# Patient Record
Sex: Female | Born: 1935
Health system: Southern US, Community
[De-identification: ages and names within clinical notes are randomized; demographics above are authoritative.]

## PROBLEM LIST (undated history)

## (undated) DIAGNOSIS — K219 Gastro-esophageal reflux disease without esophagitis: Secondary | ICD-10-CM

## (undated) DIAGNOSIS — E079 Disorder of thyroid, unspecified: Secondary | ICD-10-CM

## (undated) DIAGNOSIS — F419 Anxiety disorder, unspecified: Secondary | ICD-10-CM

## (undated) DIAGNOSIS — E559 Vitamin D deficiency, unspecified: Secondary | ICD-10-CM

## (undated) DIAGNOSIS — M179 Osteoarthritis of knee, unspecified: Secondary | ICD-10-CM

## (undated) DIAGNOSIS — I10 Essential (primary) hypertension: Secondary | ICD-10-CM

## (undated) DIAGNOSIS — D649 Anemia, unspecified: Secondary | ICD-10-CM

## (undated) DIAGNOSIS — M51369 Other intervertebral disc degeneration, lumbar region without mention of lumbar back pain or lower extremity pain: Secondary | ICD-10-CM

## (undated) DIAGNOSIS — M1710 Unilateral primary osteoarthritis, unspecified knee: Secondary | ICD-10-CM

## (undated) DIAGNOSIS — R7303 Prediabetes: Secondary | ICD-10-CM

## (undated) DIAGNOSIS — T7840XA Allergy, unspecified, initial encounter: Secondary | ICD-10-CM

## (undated) DIAGNOSIS — E785 Hyperlipidemia, unspecified: Secondary | ICD-10-CM

## (undated) DIAGNOSIS — N179 Acute kidney failure, unspecified: Secondary | ICD-10-CM

## (undated) DIAGNOSIS — M5136 Other intervertebral disc degeneration, lumbar region: Secondary | ICD-10-CM

## (undated) DIAGNOSIS — F329 Major depressive disorder, single episode, unspecified: Secondary | ICD-10-CM

## (undated) DIAGNOSIS — F32A Depression, unspecified: Secondary | ICD-10-CM

## (undated) HISTORY — PX: ORIF DISTAL FEMUR FRACTURE: SUR926

## (undated) HISTORY — PX: CATARACT EXTRACTION: SUR2

## (undated) HISTORY — DX: Depression, unspecified: F32.A

## (undated) HISTORY — DX: Allergy, unspecified, initial encounter: T78.40XA

## (undated) HISTORY — DX: Unilateral primary osteoarthritis, unspecified knee: M17.10

## (undated) HISTORY — DX: Prediabetes: R73.03

## (undated) HISTORY — DX: Anemia, unspecified: D64.9

## (undated) HISTORY — DX: Osteoarthritis of knee, unspecified: M17.9

## (undated) HISTORY — DX: Essential (primary) hypertension: I10

## (undated) HISTORY — DX: Gastro-esophageal reflux disease without esophagitis: K21.9

## (undated) HISTORY — DX: Hyperlipidemia, unspecified: E78.5

## (undated) HISTORY — DX: Vitamin D deficiency, unspecified: E55.9

## (undated) HISTORY — DX: Major depressive disorder, single episode, unspecified: F32.9

## (undated) HISTORY — DX: Anxiety disorder, unspecified: F41.9

## (undated) HISTORY — DX: Disorder of thyroid, unspecified: E07.9

## (undated) HISTORY — DX: Other intervertebral disc degeneration, lumbar region without mention of lumbar back pain or lower extremity pain: M51.369

## (undated) HISTORY — DX: Acute kidney failure, unspecified: N17.9

## (undated) HISTORY — DX: Other intervertebral disc degeneration, lumbar region: M51.36

## (undated) HISTORY — PX: KNEE ARTHROSCOPY: SHX127

---

## 1998-02-07 ENCOUNTER — Ambulatory Visit (HOSPITAL_COMMUNITY): Admission: RE | Admit: 1998-02-07 | Discharge: 1998-02-07 | Payer: Self-pay | Admitting: *Deleted

## 1999-02-05 ENCOUNTER — Other Ambulatory Visit: Admission: RE | Admit: 1999-02-05 | Discharge: 1999-02-05 | Payer: Self-pay | Admitting: *Deleted

## 1999-02-27 ENCOUNTER — Ambulatory Visit (HOSPITAL_COMMUNITY): Admission: RE | Admit: 1999-02-27 | Discharge: 1999-02-27 | Payer: Self-pay | Admitting: *Deleted

## 2000-02-06 ENCOUNTER — Other Ambulatory Visit: Admission: RE | Admit: 2000-02-06 | Discharge: 2000-02-06 | Payer: Self-pay | Admitting: *Deleted

## 2000-03-02 ENCOUNTER — Ambulatory Visit (HOSPITAL_COMMUNITY): Admission: RE | Admit: 2000-03-02 | Discharge: 2000-03-02 | Payer: Self-pay | Admitting: *Deleted

## 2001-02-24 ENCOUNTER — Other Ambulatory Visit: Admission: RE | Admit: 2001-02-24 | Discharge: 2001-02-24 | Payer: Self-pay | Admitting: Internal Medicine

## 2001-08-10 ENCOUNTER — Encounter: Payer: Self-pay | Admitting: Internal Medicine

## 2001-08-10 ENCOUNTER — Ambulatory Visit (HOSPITAL_COMMUNITY): Admission: RE | Admit: 2001-08-10 | Discharge: 2001-08-10 | Payer: Self-pay | Admitting: Internal Medicine

## 2002-02-24 ENCOUNTER — Other Ambulatory Visit: Admission: RE | Admit: 2002-02-24 | Discharge: 2002-02-24 | Payer: Self-pay | Admitting: Internal Medicine

## 2004-03-05 ENCOUNTER — Other Ambulatory Visit: Admission: RE | Admit: 2004-03-05 | Discharge: 2004-03-05 | Payer: Self-pay | Admitting: Internal Medicine

## 2004-03-07 ENCOUNTER — Ambulatory Visit (HOSPITAL_COMMUNITY): Admission: RE | Admit: 2004-03-07 | Discharge: 2004-03-07 | Payer: Self-pay | Admitting: Internal Medicine

## 2006-03-26 ENCOUNTER — Other Ambulatory Visit: Admission: RE | Admit: 2006-03-26 | Discharge: 2006-03-26 | Payer: Self-pay | Admitting: Family Medicine

## 2006-04-02 ENCOUNTER — Ambulatory Visit (HOSPITAL_COMMUNITY): Admission: RE | Admit: 2006-04-02 | Discharge: 2006-04-02 | Payer: Self-pay | Admitting: Internal Medicine

## 2006-04-20 ENCOUNTER — Ambulatory Visit: Payer: Self-pay

## 2008-05-11 ENCOUNTER — Ambulatory Visit (HOSPITAL_COMMUNITY): Admission: RE | Admit: 2008-05-11 | Discharge: 2008-05-11 | Payer: Self-pay | Admitting: Internal Medicine

## 2008-11-03 LAB — HM COLONOSCOPY

## 2009-06-27 ENCOUNTER — Emergency Department (HOSPITAL_COMMUNITY): Admission: EM | Admit: 2009-06-27 | Discharge: 2009-06-27 | Payer: Self-pay | Admitting: Emergency Medicine

## 2009-07-27 ENCOUNTER — Encounter (HOSPITAL_COMMUNITY): Admission: RE | Admit: 2009-07-27 | Discharge: 2009-10-25 | Payer: Self-pay | Admitting: Gastroenterology

## 2010-09-21 ENCOUNTER — Inpatient Hospital Stay (HOSPITAL_COMMUNITY): Admission: EM | Admit: 2010-09-21 | Discharge: 2010-09-25 | Payer: Self-pay | Admitting: Emergency Medicine

## 2010-09-23 ENCOUNTER — Ambulatory Visit: Payer: Self-pay | Admitting: Physical Medicine & Rehabilitation

## 2011-01-14 LAB — CBC
HCT: 28.5 % — ABNORMAL LOW (ref 36.0–46.0)
HCT: 31.3 % — ABNORMAL LOW (ref 36.0–46.0)
Hemoglobin: 9.4 g/dL — ABNORMAL LOW (ref 12.0–15.0)
MCV: 92.2 fL (ref 78.0–100.0)
MCV: 92.3 fL (ref 78.0–100.0)
RBC: 3.39 MIL/uL — ABNORMAL LOW (ref 3.87–5.11)
RDW: 13.2 % (ref 11.5–15.5)
WBC: 5.1 10*3/uL (ref 4.0–10.5)
WBC: 8.6 10*3/uL (ref 4.0–10.5)

## 2011-01-14 LAB — BASIC METABOLIC PANEL
BUN: 28 mg/dL — ABNORMAL HIGH (ref 6–23)
BUN: 8 mg/dL (ref 6–23)
Chloride: 110 mEq/L (ref 96–112)
Chloride: 114 mEq/L — ABNORMAL HIGH (ref 96–112)
GFR calc Af Amer: 60 mL/min — ABNORMAL LOW (ref >60)
GFR calc non Af Amer: 49 mL/min — ABNORMAL LOW (ref >60)
GFR calc non Af Amer: >60 mL/min (ref >60)
Glucose, Bld: 101 mg/dL — ABNORMAL HIGH (ref 70–99)
Potassium: 4.1 mEq/L (ref 3.5–5.1)
Potassium: 4.9 mEq/L (ref 3.5–5.1)
Sodium: 140 mEq/L (ref 135–145)
Sodium: 140 mEq/L (ref 135–145)

## 2011-02-07 LAB — CROSSMATCH: Antibody Screen: NEGATIVE

## 2011-02-07 LAB — ABO/RH: ABO/RH(D): O POS

## 2011-02-08 LAB — BASIC METABOLIC PANEL
Chloride: 100 mEq/L (ref 96–112)
GFR calc Af Amer: 58 mL/min — ABNORMAL LOW (ref >60)
GFR calc non Af Amer: 48 mL/min — ABNORMAL LOW (ref >60)
Potassium: 4.8 mEq/L (ref 3.5–5.1)
Sodium: 128 mEq/L — ABNORMAL LOW (ref 135–145)

## 2011-11-17 DIAGNOSIS — E538 Deficiency of other specified B group vitamins: Secondary | ICD-10-CM | POA: Diagnosis not present

## 2011-11-17 DIAGNOSIS — E559 Vitamin D deficiency, unspecified: Secondary | ICD-10-CM | POA: Diagnosis not present

## 2011-11-17 DIAGNOSIS — I10 Essential (primary) hypertension: Secondary | ICD-10-CM | POA: Diagnosis not present

## 2011-11-17 DIAGNOSIS — Z79899 Other long term (current) drug therapy: Secondary | ICD-10-CM | POA: Diagnosis not present

## 2011-11-17 DIAGNOSIS — R7309 Other abnormal glucose: Secondary | ICD-10-CM | POA: Diagnosis not present

## 2011-11-17 DIAGNOSIS — E782 Mixed hyperlipidemia: Secondary | ICD-10-CM | POA: Diagnosis not present

## 2011-11-17 DIAGNOSIS — D649 Anemia, unspecified: Secondary | ICD-10-CM | POA: Diagnosis not present

## 2011-12-18 DIAGNOSIS — E538 Deficiency of other specified B group vitamins: Secondary | ICD-10-CM | POA: Diagnosis not present

## 2012-01-15 DIAGNOSIS — E538 Deficiency of other specified B group vitamins: Secondary | ICD-10-CM | POA: Diagnosis not present

## 2012-02-17 DIAGNOSIS — R7309 Other abnormal glucose: Secondary | ICD-10-CM | POA: Diagnosis not present

## 2012-02-17 DIAGNOSIS — I10 Essential (primary) hypertension: Secondary | ICD-10-CM | POA: Diagnosis not present

## 2012-02-17 DIAGNOSIS — E538 Deficiency of other specified B group vitamins: Secondary | ICD-10-CM | POA: Diagnosis not present

## 2012-02-17 DIAGNOSIS — E782 Mixed hyperlipidemia: Secondary | ICD-10-CM | POA: Diagnosis not present

## 2012-02-17 DIAGNOSIS — E559 Vitamin D deficiency, unspecified: Secondary | ICD-10-CM | POA: Diagnosis not present

## 2012-03-25 DIAGNOSIS — E538 Deficiency of other specified B group vitamins: Secondary | ICD-10-CM | POA: Diagnosis not present

## 2012-04-26 DIAGNOSIS — E538 Deficiency of other specified B group vitamins: Secondary | ICD-10-CM | POA: Diagnosis not present

## 2012-05-20 ENCOUNTER — Other Ambulatory Visit (HOSPITAL_COMMUNITY): Payer: Self-pay | Admitting: Physician Assistant

## 2012-05-20 DIAGNOSIS — E782 Mixed hyperlipidemia: Secondary | ICD-10-CM | POA: Diagnosis not present

## 2012-05-20 DIAGNOSIS — E559 Vitamin D deficiency, unspecified: Secondary | ICD-10-CM | POA: Diagnosis not present

## 2012-05-20 DIAGNOSIS — Z1212 Encounter for screening for malignant neoplasm of rectum: Secondary | ICD-10-CM | POA: Diagnosis not present

## 2012-05-20 DIAGNOSIS — Z23 Encounter for immunization: Secondary | ICD-10-CM | POA: Diagnosis not present

## 2012-05-20 DIAGNOSIS — I10 Essential (primary) hypertension: Secondary | ICD-10-CM | POA: Diagnosis not present

## 2012-05-20 DIAGNOSIS — E538 Deficiency of other specified B group vitamins: Secondary | ICD-10-CM | POA: Diagnosis not present

## 2012-05-20 DIAGNOSIS — Z79899 Other long term (current) drug therapy: Secondary | ICD-10-CM | POA: Diagnosis not present

## 2012-05-20 DIAGNOSIS — M810 Age-related osteoporosis without current pathological fracture: Secondary | ICD-10-CM

## 2012-05-21 ENCOUNTER — Ambulatory Visit (HOSPITAL_COMMUNITY): Payer: Self-pay

## 2012-05-24 ENCOUNTER — Ambulatory Visit (HOSPITAL_COMMUNITY)
Admission: RE | Admit: 2012-05-24 | Discharge: 2012-05-24 | Disposition: A | Discharge status: Home / Self Care | Payer: Medicare Other | Source: Ambulatory Visit | Attending: Physician Assistant | Admitting: Physician Assistant

## 2012-05-24 DIAGNOSIS — M810 Age-related osteoporosis without current pathological fracture: Secondary | ICD-10-CM

## 2012-05-24 DIAGNOSIS — J449 Chronic obstructive pulmonary disease, unspecified: Secondary | ICD-10-CM | POA: Insufficient documentation

## 2012-05-24 DIAGNOSIS — Z0000 Encounter for general adult medical examination without abnormal findings: Secondary | ICD-10-CM | POA: Diagnosis not present

## 2012-05-24 DIAGNOSIS — Z13820 Encounter for screening for osteoporosis: Secondary | ICD-10-CM | POA: Insufficient documentation

## 2012-05-24 DIAGNOSIS — Z780 Asymptomatic menopausal state: Secondary | ICD-10-CM | POA: Diagnosis not present

## 2012-05-24 DIAGNOSIS — I10 Essential (primary) hypertension: Secondary | ICD-10-CM | POA: Insufficient documentation

## 2012-05-24 DIAGNOSIS — J4489 Other specified chronic obstructive pulmonary disease: Secondary | ICD-10-CM | POA: Insufficient documentation

## 2012-05-25 DIAGNOSIS — N6489 Other specified disorders of breast: Secondary | ICD-10-CM | POA: Diagnosis not present

## 2012-06-01 DIAGNOSIS — M109 Gout, unspecified: Secondary | ICD-10-CM | POA: Diagnosis not present

## 2012-06-01 DIAGNOSIS — I10 Essential (primary) hypertension: Secondary | ICD-10-CM | POA: Diagnosis not present

## 2012-06-21 DIAGNOSIS — E538 Deficiency of other specified B group vitamins: Secondary | ICD-10-CM | POA: Diagnosis not present

## 2012-06-28 DIAGNOSIS — R920 Mammographic microcalcification found on diagnostic imaging of breast: Secondary | ICD-10-CM | POA: Diagnosis not present

## 2012-07-22 DIAGNOSIS — E538 Deficiency of other specified B group vitamins: Secondary | ICD-10-CM | POA: Diagnosis not present

## 2012-08-19 DIAGNOSIS — E538 Deficiency of other specified B group vitamins: Secondary | ICD-10-CM | POA: Diagnosis not present

## 2012-08-19 DIAGNOSIS — Z23 Encounter for immunization: Secondary | ICD-10-CM | POA: Diagnosis not present

## 2012-09-21 DIAGNOSIS — E538 Deficiency of other specified B group vitamins: Secondary | ICD-10-CM | POA: Diagnosis not present

## 2012-10-21 DIAGNOSIS — E538 Deficiency of other specified B group vitamins: Secondary | ICD-10-CM | POA: Diagnosis not present

## 2012-11-15 DIAGNOSIS — E538 Deficiency of other specified B group vitamins: Secondary | ICD-10-CM | POA: Diagnosis not present

## 2012-11-15 DIAGNOSIS — Z79899 Other long term (current) drug therapy: Secondary | ICD-10-CM | POA: Diagnosis not present

## 2012-11-15 DIAGNOSIS — E782 Mixed hyperlipidemia: Secondary | ICD-10-CM | POA: Diagnosis not present

## 2012-11-15 DIAGNOSIS — E559 Vitamin D deficiency, unspecified: Secondary | ICD-10-CM | POA: Diagnosis not present

## 2012-11-15 DIAGNOSIS — I10 Essential (primary) hypertension: Secondary | ICD-10-CM | POA: Diagnosis not present

## 2012-11-15 DIAGNOSIS — R7309 Other abnormal glucose: Secondary | ICD-10-CM | POA: Diagnosis not present

## 2012-12-23 DIAGNOSIS — E538 Deficiency of other specified B group vitamins: Secondary | ICD-10-CM | POA: Diagnosis not present

## 2013-01-20 DIAGNOSIS — E538 Deficiency of other specified B group vitamins: Secondary | ICD-10-CM | POA: Diagnosis not present

## 2013-01-27 DIAGNOSIS — Z961 Presence of intraocular lens: Secondary | ICD-10-CM | POA: Diagnosis not present

## 2013-01-27 DIAGNOSIS — H02409 Unspecified ptosis of unspecified eyelid: Secondary | ICD-10-CM | POA: Diagnosis not present

## 2013-01-27 DIAGNOSIS — H3530 Unspecified macular degeneration: Secondary | ICD-10-CM | POA: Diagnosis not present

## 2013-02-22 DIAGNOSIS — D649 Anemia, unspecified: Secondary | ICD-10-CM | POA: Diagnosis not present

## 2013-02-22 DIAGNOSIS — E559 Vitamin D deficiency, unspecified: Secondary | ICD-10-CM | POA: Diagnosis not present

## 2013-02-22 DIAGNOSIS — R7309 Other abnormal glucose: Secondary | ICD-10-CM | POA: Diagnosis not present

## 2013-02-22 DIAGNOSIS — E538 Deficiency of other specified B group vitamins: Secondary | ICD-10-CM | POA: Diagnosis not present

## 2013-02-22 DIAGNOSIS — I10 Essential (primary) hypertension: Secondary | ICD-10-CM | POA: Diagnosis not present

## 2013-02-22 DIAGNOSIS — E782 Mixed hyperlipidemia: Secondary | ICD-10-CM | POA: Diagnosis not present

## 2013-02-22 DIAGNOSIS — Z79899 Other long term (current) drug therapy: Secondary | ICD-10-CM | POA: Diagnosis not present

## 2013-03-17 DIAGNOSIS — E538 Deficiency of other specified B group vitamins: Secondary | ICD-10-CM | POA: Diagnosis not present

## 2013-04-18 DIAGNOSIS — E538 Deficiency of other specified B group vitamins: Secondary | ICD-10-CM | POA: Diagnosis not present

## 2013-05-23 DIAGNOSIS — I10 Essential (primary) hypertension: Secondary | ICD-10-CM | POA: Diagnosis not present

## 2013-05-23 DIAGNOSIS — E559 Vitamin D deficiency, unspecified: Secondary | ICD-10-CM | POA: Diagnosis not present

## 2013-05-23 DIAGNOSIS — Z1212 Encounter for screening for malignant neoplasm of rectum: Secondary | ICD-10-CM | POA: Diagnosis not present

## 2013-05-23 DIAGNOSIS — R7309 Other abnormal glucose: Secondary | ICD-10-CM | POA: Diagnosis not present

## 2013-05-23 DIAGNOSIS — D649 Anemia, unspecified: Secondary | ICD-10-CM | POA: Diagnosis not present

## 2013-05-23 DIAGNOSIS — E538 Deficiency of other specified B group vitamins: Secondary | ICD-10-CM | POA: Diagnosis not present

## 2013-05-23 DIAGNOSIS — E782 Mixed hyperlipidemia: Secondary | ICD-10-CM | POA: Diagnosis not present

## 2013-05-23 DIAGNOSIS — Z79899 Other long term (current) drug therapy: Secondary | ICD-10-CM | POA: Diagnosis not present

## 2013-06-20 DIAGNOSIS — E538 Deficiency of other specified B group vitamins: Secondary | ICD-10-CM | POA: Diagnosis not present

## 2013-07-21 DIAGNOSIS — E538 Deficiency of other specified B group vitamins: Secondary | ICD-10-CM | POA: Diagnosis not present

## 2013-07-21 DIAGNOSIS — Z23 Encounter for immunization: Secondary | ICD-10-CM | POA: Diagnosis not present

## 2013-08-23 DIAGNOSIS — E538 Deficiency of other specified B group vitamins: Secondary | ICD-10-CM | POA: Diagnosis not present

## 2013-08-23 DIAGNOSIS — R7309 Other abnormal glucose: Secondary | ICD-10-CM | POA: Diagnosis not present

## 2013-08-23 DIAGNOSIS — Z79899 Other long term (current) drug therapy: Secondary | ICD-10-CM | POA: Diagnosis not present

## 2013-08-23 DIAGNOSIS — I10 Essential (primary) hypertension: Secondary | ICD-10-CM | POA: Diagnosis not present

## 2013-08-23 DIAGNOSIS — E782 Mixed hyperlipidemia: Secondary | ICD-10-CM | POA: Diagnosis not present

## 2013-08-23 DIAGNOSIS — E559 Vitamin D deficiency, unspecified: Secondary | ICD-10-CM | POA: Diagnosis not present

## 2013-09-22 ENCOUNTER — Ambulatory Visit: Payer: Self-pay

## 2013-09-22 ENCOUNTER — Ambulatory Visit: Payer: Medicare Other

## 2013-09-22 VITALS — BP 122/68 | HR 76 | Temp 98.6°F | Resp 16 | Ht 64.0 in | Wt 134.0 lb

## 2013-09-22 DIAGNOSIS — D510 Vitamin B12 deficiency anemia due to intrinsic factor deficiency: Secondary | ICD-10-CM

## 2013-09-22 DIAGNOSIS — E538 Deficiency of other specified B group vitamins: Secondary | ICD-10-CM | POA: Diagnosis not present

## 2013-09-22 MED ORDER — CYANOCOBALAMIN 1000 MCG/ML IJ SOLN
1000.0000 ug | Freq: Once | INTRAMUSCULAR | Status: AC
  Administered 2013-09-22: 1000 ug via INTRAMUSCULAR

## 2013-09-22 NOTE — Progress Notes (Signed)
Patient ID: Shelby Bradley, female   DOB: 06-May-1928, 79 y.o.   MRN: [id number] Patient here today for her Vitamin B12 injection, received 0.5 ml IM left deltoid. Patient tolerated well.

## 2013-10-20 ENCOUNTER — Encounter: Payer: Self-pay | Admitting: Internal Medicine

## 2013-10-20 ENCOUNTER — Ambulatory Visit (INDEPENDENT_AMBULATORY_CARE_PROVIDER_SITE_OTHER): Payer: Medicare Other

## 2013-10-20 VITALS — BP 128/76 | HR 68 | Temp 97.7°F | Resp 16 | Wt 134.8 lb

## 2013-10-20 DIAGNOSIS — E538 Deficiency of other specified B group vitamins: Secondary | ICD-10-CM

## 2013-10-20 MED ORDER — CYANOCOBALAMIN 1000 MCG/ML IJ SOLN
500.0000 ug | Freq: Once | INTRAMUSCULAR | Status: AC
  Administered 2013-10-20: 500 ug via INTRAMUSCULAR

## 2013-12-01 ENCOUNTER — Ambulatory Visit (INDEPENDENT_AMBULATORY_CARE_PROVIDER_SITE_OTHER): Payer: Medicare Other | Admitting: Physician Assistant

## 2013-12-01 ENCOUNTER — Encounter: Payer: Self-pay | Admitting: Physician Assistant

## 2013-12-01 VITALS — BP 110/60 | HR 72 | Temp 98.2°F | Resp 16 | Ht 64.0 in | Wt 129.0 lb

## 2013-12-01 DIAGNOSIS — I10 Essential (primary) hypertension: Secondary | ICD-10-CM | POA: Diagnosis not present

## 2013-12-01 DIAGNOSIS — E559 Vitamin D deficiency, unspecified: Secondary | ICD-10-CM

## 2013-12-01 DIAGNOSIS — K219 Gastro-esophageal reflux disease without esophagitis: Secondary | ICD-10-CM

## 2013-12-01 DIAGNOSIS — R0989 Other specified symptoms and signs involving the circulatory and respiratory systems: Secondary | ICD-10-CM | POA: Insufficient documentation

## 2013-12-01 DIAGNOSIS — D649 Anemia, unspecified: Secondary | ICD-10-CM

## 2013-12-01 DIAGNOSIS — D510 Vitamin B12 deficiency anemia due to intrinsic factor deficiency: Secondary | ICD-10-CM | POA: Diagnosis not present

## 2013-12-01 DIAGNOSIS — R7309 Other abnormal glucose: Secondary | ICD-10-CM | POA: Insufficient documentation

## 2013-12-01 DIAGNOSIS — E538 Deficiency of other specified B group vitamins: Secondary | ICD-10-CM | POA: Insufficient documentation

## 2013-12-01 DIAGNOSIS — E782 Mixed hyperlipidemia: Secondary | ICD-10-CM | POA: Insufficient documentation

## 2013-12-01 DIAGNOSIS — F419 Anxiety disorder, unspecified: Secondary | ICD-10-CM

## 2013-12-01 DIAGNOSIS — D519 Vitamin B12 deficiency anemia, unspecified: Secondary | ICD-10-CM | POA: Insufficient documentation

## 2013-12-01 DIAGNOSIS — Z79899 Other long term (current) drug therapy: Secondary | ICD-10-CM | POA: Diagnosis not present

## 2013-12-01 DIAGNOSIS — R7303 Prediabetes: Secondary | ICD-10-CM

## 2013-12-01 DIAGNOSIS — E785 Hyperlipidemia, unspecified: Secondary | ICD-10-CM

## 2013-12-01 LAB — LIPID PANEL
CHOL/HDL RATIO: 3.9 ratio
CHOLESTEROL: 164 mg/dL (ref 0–200)
HDL: 42 mg/dL (ref >39)
LDL Cholesterol: 85 mg/dL (ref 0–99)
Triglycerides: 183 mg/dL — ABNORMAL HIGH (ref <150)
VLDL: 37 mg/dL (ref 0–40)

## 2013-12-01 LAB — HEPATIC FUNCTION PANEL
ALBUMIN: 3.9 g/dL (ref 3.5–5.2)
ALK PHOS: 76 U/L (ref 39–117)
ALT: 10 U/L (ref 0–35)
AST: 20 U/L (ref 0–37)
BILIRUBIN TOTAL: 0.3 mg/dL (ref 0.2–1.2)
Bilirubin, Direct: 0.1 mg/dL (ref 0.0–0.3)
Indirect Bilirubin: 0.2 mg/dL (ref 0.2–1.2)
TOTAL PROTEIN: 6.3 g/dL (ref 6.0–8.3)

## 2013-12-01 LAB — BASIC METABOLIC PANEL WITH GFR
BUN: 18 mg/dL (ref 6–23)
CALCIUM: 9.6 mg/dL (ref 8.4–10.5)
CO2: 24 meq/L (ref 19–32)
CREATININE: 0.91 mg/dL (ref 0.50–1.10)
Chloride: 104 mEq/L (ref 96–112)
GFR, EST NON AFRICAN AMERICAN: 59 mL/min — AB
GFR, Est African American: 67 mL/min
Glucose, Bld: 84 mg/dL (ref 70–99)
Potassium: 4.4 mEq/L (ref 3.5–5.3)
Sodium: 136 mEq/L (ref 135–145)

## 2013-12-01 LAB — TSH: TSH: 8.776 u[IU]/mL — AB (ref 0.350–4.500)

## 2013-12-01 LAB — CBC WITH DIFFERENTIAL/PLATELET
BASOS ABS: 0 10*3/uL (ref 0.0–0.1)
Basophils Relative: 1 % (ref 0–1)
EOS ABS: 0.1 10*3/uL (ref 0.0–0.7)
EOS PCT: 2 % (ref 0–5)
HEMATOCRIT: 33 % — AB (ref 36.0–46.0)
Hemoglobin: 11.2 g/dL — ABNORMAL LOW (ref 12.0–15.0)
Lymphocytes Relative: 29 % (ref 12–46)
Lymphs Abs: 1.6 10*3/uL (ref 0.7–4.0)
MCH: 29.6 pg (ref 26.0–34.0)
MCHC: 33.9 g/dL (ref 30.0–36.0)
MCV: 87.1 fL (ref 78.0–100.0)
MONO ABS: 0.4 10*3/uL (ref 0.1–1.0)
Monocytes Relative: 7 % (ref 3–12)
Neutro Abs: 3.3 10*3/uL (ref 1.7–7.7)
Neutrophils Relative %: 61 % (ref 43–77)
PLATELETS: 227 10*3/uL (ref 150–400)
RBC: 3.79 MIL/uL — ABNORMAL LOW (ref 3.87–5.11)
RDW: 14 % (ref 11.5–15.5)
WBC: 5.4 10*3/uL (ref 4.0–10.5)

## 2013-12-01 LAB — HEMOGLOBIN A1C
HEMOGLOBIN A1C: 5.9 % — AB (ref <5.7)
Mean Plasma Glucose: 123 mg/dL — ABNORMAL HIGH (ref <117)

## 2013-12-01 LAB — MAGNESIUM: Magnesium: 2 mg/dL (ref 1.5–2.5)

## 2013-12-01 MED ORDER — CYANOCOBALAMIN 1000 MCG/ML IJ SOLN
500.0000 ug | Freq: Once | INTRAMUSCULAR | Status: AC
  Administered 2013-12-01: 500 ug via INTRAMUSCULAR

## 2013-12-01 NOTE — Progress Notes (Signed)
HPI Pleasant 80 y.o. female presents for 3 month follow up with hypertension, hyperlipidemia, prediabetes and vitamin D. Patient's blood pressure has been controlled at home, today their BP is BP: 110/60 mmHg  Patient denies chest pain, shortness of breath, dizziness.  Patient's cholesterol is diet controlled. In addition they are on lopid and denies myalgias. The cholesterol last visit was LDL 73.  CKD with GFR 66, normal Cr 0.82, BUN 14.  The patient has been working on diet and exercise for prediabetes, and denies changes in vision, polys, and paresthesias. A1C 6.1-->6.5-->5.9.  Patient is on Vitamin D supplement.  Vitamin D 38.  Current Medications:  Current Outpatient Prescriptions on File Prior to Visit  Medication Sig Dispense Refill  . ALPRAZolam (XANAX) 0.25 MG tablet Take 0.25 mg by mouth at bedtime as needed for anxiety.      Marland Kitchen amitriptyline (ELAVIL) 50 MG tablet Take 50 mg by mouth at bedtime.      Marland Kitchen amLODipine (NORVASC) 2.5 MG tablet Take 2.5 mg by mouth daily.      . Cholecalciferol (VITAMIN D-3) 5000 UNITS TABS Take 5,000 Units by mouth daily.      . cyanocobalamin (,VITAMIN B-12,) 1000 MCG/ML injection Inject 500 mcg into the muscle every 30 (thirty) days.      Marland Kitchen gemfibrozil (LOPID) 600 MG tablet Take 600 mg by mouth daily.      Marland Kitchen levothyroxine (SYNTHROID, LEVOTHROID) 125 MCG tablet Take 125 mcg by mouth daily before breakfast.      . omeprazole (PRILOSEC) 40 MG capsule Take 40 mg by mouth daily.       No current facility-administered medications on file prior to visit.   Medical History:  Past Medical History  Diagnosis Date  . Hypertension   . Hyperlipidemia   . Thyroid disease   . Anxiety   . Depression   . Anemia   . GERD (gastroesophageal reflux disease)   . Allergy   . DDD (degenerative disc disease), lumbar   . OA (osteoarthritis) of knee     right knee  . Vitamin D deficiency   . Pre-diabetes    Allergies:  Allergies  Allergen Reactions  . Amlodipine  Swelling    Swelling/edema  . Norvasc [Amlodipine Besylate]   . Sulfa Antibiotics     ROS Constitutional: Denies fever, chills, headaches, insomnia, fatigue, night sweats Eyes: Denies redness, blurred vision, diplopia, discharge, itchy, watery eyes.  ENT: Denies congestion, post nasal drip, sore throat, earache, dental pain, Tinnitus, Vertigo, Sinus pain, snoring.  Cardio: Denies chest pain, palpitations, irregular heartbeat, dyspnea, diaphoresis, orthopnea, PND, claudication, edema Respiratory: denies cough, shortness of breath, wheezing.  Gastrointestinal: Denies dysphagia, heartburn, AB pain/ cramps, N/V, diarrhea, constipation, hematemesis, melena, hematochezia,  hemorrhoids Genitourinary: Denies dysuria, frequency, urgency, nocturia, hesitancy, discharge, hematuria, flank pain Musculoskeletal: Denies myalgia, stiffness, pain, swelling and strain/sprain. Skin: Denies pruritis, rash, changing in skin lesion Neuro: Denies Weakness, tremor, incoordination, spasms, pain Psychiatric: Denies confusion, memory loss, sensory loss Endocrine: Denies change in weight, skin, hair change, nocturia Diabetic Polys, Denies visual blurring, hyper /hypo glycemic episodes, and paresthesia, Heme/Lymph: Denies Excessive bleeding, bruising, enlarged lymph nodes  Family history- Review and unchanged Social history- Review and unchanged Physical Exam: Filed Vitals:   12/01/13 1453  BP: 110/60  Pulse: 72  Temp: 98.2 F (36.8 C)  Resp: 16   Filed Weights   12/01/13 1453  Weight: 129 lb (58.514 kg)   General Appearance: Well nourished, in no apparent distress. Eyes: PERRLA, EOMs, conjunctiva no  swelling or erythema Sinuses: No Frontal/maxillary tenderness ENT/Mouth: Ext aud canals clear, TMs without erythema, bulging. No erythema, swelling, or exudate on post pharynx.  Tonsils not swollen or erythematous. Hearing normal.  Neck: Supple, thyroid normal.  Respiratory: Respiratory effort normal, BS  equal bilaterally without rales, rhonchi, wheezing or stridor.  Cardio: RRR with no MRGs. Brisk peripheral pulses without edema.  Abdomen: Soft, + BS.  Non tender, no guarding, rebound, hernias, masses. Lymphatics: Non tender without lymphadenopathy.  Musculoskeletal: Full ROM, 5/5 strength, normal gait.  Skin: Warm, dry without rashes, lesions, ecchymosis.  Neuro: Cranial nerves intact. Normal muscle tone, no cerebellar symptoms. Sensation intact.  Psych: Awake and oriented X 3, normal affect, Insight and Judgment appropriate.   Assessment and Plan:  Hypertension: Continue medication, monitor blood pressure at home.  Continue DASH diet. Cholesterol: Continue diet and exercise. Check cholesterol.  Pre-diabetes-Continue diet and exercise. Check A1C Vitamin D Def- check level and continue medications.   Continue diet and meds as discussed. Further disposition pending results of labs.  Quentin Mullingollier, Meriam Chojnowski 2:57 PM

## 2013-12-01 NOTE — Patient Instructions (Signed)
   Bad carbs also include fruit juice, alcohol, and sweet tea. These are empty calories that do not signal to your brain that you are full.   Please remember the good carbs are still carbs which convert into sugar. So please measure them out no more than 1/2-1 cup of rice, oatmeal, pasta, and beans.  Veggies are however free foods! Pile them on.   I like lean protein at every meal such as chicken, turkey, pork chops, cottage cheese, etc. Just do not fry these meats and please center your meal around vegetable, the meats should be a side dish.   No all fruit is created equal. Please see the list below, the fruit at the bottom is higher in sugars than the fruit at the top   Diet for Gastroesophageal Reflux Disease, Adult Reflux (acid reflux) is when acid from your stomach flows up into the esophagus. When acid comes in contact with the esophagus, the acid causes irritation and soreness (inflammation) in the esophagus. When reflux happens often or so severely that it causes damage to the esophagus, it is called gastroesophageal reflux disease (GERD). Nutrition therapy can help ease the discomfort of GERD. FOODS OR DRINKS TO AVOID OR LIMIT  Smoking or chewing tobacco. Nicotine is one of the most potent stimulants to acid production in the gastrointestinal tract.  Caffeinated and decaffeinated coffee and black tea.  Regular or low-calorie carbonated beverages or energy drinks (caffeine-free carbonated beverages are allowed).   Strong spices, such as black pepper, white pepper, red pepper, cayenne, curry powder, and chili powder.  Peppermint or spearmint.  Chocolate.  High-fat foods, including meats and fried foods. Extra added fats including oils, butter, salad dressings, and nuts. Limit these to less than 8 tsp per day.  Fruits and vegetables if they are not tolerated, such as citrus fruits or tomatoes.  Alcohol.  Any food that seems to aggravate your condition. If you have questions  regarding your diet, call your caregiver or a registered dietitian. OTHER THINGS THAT MAY HELP GERD INCLUDE:   Eating your meals slowly, in a relaxed setting.  Eating 5 to 6 small meals per day instead of 3 large meals.  Eliminating food for a period of time if it causes distress.  Not lying down until 3 hours after eating a meal.  Keeping the head of your bed raised 6 to 9 inches (15 to 23 cm) by using a foam wedge or blocks under the legs of the bed. Lying flat may make symptoms worse.  Being physically active. Weight loss may be helpful in reducing reflux in overweight or obese adults.  Wear loose fitting clothing EXAMPLE MEAL PLAN This meal plan is approximately 2,000 calories based on [employer] meal planning guidelines. Breakfast   cup cooked oatmeal.  1 cup strawberries.  1 cup low-fat milk.  1 oz almonds. Snack  1 cup cucumber slices.  6 oz yogurt (made from low-fat or fat-free milk). Lunch  2 slice whole-wheat bread.  2 oz sliced turkey.  2 tsp mayonnaise.  1 cup blueberries.  1 cup snap peas. Snack  6 whole-wheat crackers.  1 oz string cheese. Dinner   cup brown rice.  1 cup mixed veggies.  1 tsp olive oil.  3 oz grilled fish. Document Released: 10/20/2005 Document Revised: 01/12/2012 Document Reviewed: 09/05/2011 [employer] Patient Information 2014 [employer].  

## 2013-12-15 ENCOUNTER — Other Ambulatory Visit: Payer: Self-pay | Admitting: Physician Assistant

## 2013-12-16 ENCOUNTER — Other Ambulatory Visit: Payer: Self-pay | Admitting: Physician Assistant

## 2013-12-16 MED ORDER — LEVOTHYROXINE SODIUM 125 MCG PO TABS: 125.0000 ug | ORAL_TABLET | Freq: Every day | ORAL | Status: DC

## 2013-12-16 MED ORDER — OMEPRAZOLE 40 MG PO CPDR: 40.0000 mg | DELAYED_RELEASE_CAPSULE | Freq: Every day | ORAL | Status: DC

## 2013-12-16 MED ORDER — GEMFIBROZIL 600 MG PO TABS: 600.0000 mg | ORAL_TABLET | Freq: Two times a day (BID) | ORAL | Status: DC

## 2013-12-26 ENCOUNTER — Telehealth: Payer: Self-pay | Admitting: *Deleted

## 2013-12-26 NOTE — Telephone Encounter (Signed)
REFILL = AMITRIPTYLINE 50MG 

## 2013-12-27 ENCOUNTER — Other Ambulatory Visit: Payer: Self-pay | Admitting: Emergency Medicine

## 2013-12-27 MED ORDER — AMITRIPTYLINE HCL 50 MG PO TABS: 50.0000 mg | ORAL_TABLET | Freq: Every day | ORAL | Status: DC

## 2013-12-27 NOTE — Telephone Encounter (Signed)
REfill sent in, can you call and let her know it should be at [facility]

## 2013-12-29 ENCOUNTER — Ambulatory Visit: Payer: Self-pay

## 2014-01-02 ENCOUNTER — Ambulatory Visit (INDEPENDENT_AMBULATORY_CARE_PROVIDER_SITE_OTHER): Payer: Medicare Other | Admitting: *Deleted

## 2014-01-02 DIAGNOSIS — E538 Deficiency of other specified B group vitamins: Secondary | ICD-10-CM

## 2014-01-02 MED ORDER — CYANOCOBALAMIN 1000 MCG/ML IJ SOLN
1000.0000 ug | Freq: Once | INTRAMUSCULAR | Status: AC
  Administered 2014-01-02: 1000 ug via INTRAMUSCULAR

## 2014-01-02 NOTE — Progress Notes (Signed)
Patient ID: Shelby BishopSara W Leedy, female   DOB: [birth year], 80 y.o.   MRN: [id number] Patient presents to [facility] to administer B12 injection.  Left deltoid was cleaned with alcohol and patient received 0.5 cc IM.  Patient tolerated well.

## 2014-01-19 ENCOUNTER — Ambulatory Visit: Payer: Self-pay | Admitting: Emergency Medicine

## 2014-01-21 ENCOUNTER — Other Ambulatory Visit: Payer: Self-pay | Admitting: Physician Assistant

## 2014-01-22 ENCOUNTER — Other Ambulatory Visit: Payer: Self-pay | Admitting: Physician Assistant

## 2014-02-02 ENCOUNTER — Ambulatory Visit: Payer: Self-pay

## 2014-02-02 DIAGNOSIS — H02409 Unspecified ptosis of unspecified eyelid: Secondary | ICD-10-CM | POA: Diagnosis not present

## 2014-02-02 DIAGNOSIS — Z961 Presence of intraocular lens: Secondary | ICD-10-CM | POA: Diagnosis not present

## 2014-02-02 DIAGNOSIS — H353190 Nonexudative age-related macular degeneration, unspecified eye, stage unspecified: Secondary | ICD-10-CM | POA: Diagnosis not present

## 2014-02-20 ENCOUNTER — Other Ambulatory Visit: Payer: Self-pay | Admitting: Emergency Medicine

## 2014-03-02 ENCOUNTER — Ambulatory Visit (INDEPENDENT_AMBULATORY_CARE_PROVIDER_SITE_OTHER): Payer: Medicare Other | Admitting: Emergency Medicine

## 2014-03-02 ENCOUNTER — Other Ambulatory Visit: Payer: Self-pay | Admitting: Emergency Medicine

## 2014-03-02 ENCOUNTER — Encounter: Payer: Self-pay | Admitting: Emergency Medicine

## 2014-03-02 VITALS — BP 132/64 | HR 82 | Temp 98.2°F | Resp 16 | Ht 64.0 in | Wt 128.0 lb

## 2014-03-02 DIAGNOSIS — E538 Deficiency of other specified B group vitamins: Secondary | ICD-10-CM | POA: Diagnosis not present

## 2014-03-02 DIAGNOSIS — I10 Essential (primary) hypertension: Secondary | ICD-10-CM

## 2014-03-02 DIAGNOSIS — R7309 Other abnormal glucose: Secondary | ICD-10-CM | POA: Diagnosis not present

## 2014-03-02 DIAGNOSIS — E782 Mixed hyperlipidemia: Secondary | ICD-10-CM

## 2014-03-02 DIAGNOSIS — E039 Hypothyroidism, unspecified: Secondary | ICD-10-CM

## 2014-03-02 LAB — CBC WITH DIFFERENTIAL/PLATELET
BASOS ABS: 0 10*3/uL (ref 0.0–0.1)
Basophils Relative: 0 % (ref 0–1)
EOS PCT: 1 % (ref 0–5)
Eosinophils Absolute: 0 10*3/uL (ref 0.0–0.7)
HCT: 30.6 % — ABNORMAL LOW (ref 36.0–46.0)
Hemoglobin: 10.2 g/dL — ABNORMAL LOW (ref 12.0–15.0)
LYMPHS ABS: 1.3 10*3/uL (ref 0.7–4.0)
Lymphocytes Relative: 28 % (ref 12–46)
MCH: 29.1 pg (ref 26.0–34.0)
MCHC: 33.3 g/dL (ref 30.0–36.0)
MCV: 87.2 fL (ref 78.0–100.0)
Monocytes Absolute: 0.4 10*3/uL (ref 0.1–1.0)
Monocytes Relative: 8 % (ref 3–12)
NEUTROS PCT: 63 % (ref 43–77)
Neutro Abs: 3 10*3/uL (ref 1.7–7.7)
PLATELETS: 206 10*3/uL (ref 150–400)
RBC: 3.51 MIL/uL — AB (ref 3.87–5.11)
RDW: 14 % (ref 11.5–15.5)
WBC: 4.7 10*3/uL (ref 4.0–10.5)

## 2014-03-02 MED ORDER — CYANOCOBALAMIN 1000 MCG/ML IJ SOLN: 500.0000 ug | INTRAMUSCULAR | Status: DC

## 2014-03-02 MED ORDER — CYANOCOBALAMIN 1000 MCG/ML IJ SOLN
500.0000 ug | Freq: Once | INTRAMUSCULAR | Status: AC
  Administered 2014-03-02: 500 ug via INTRAMUSCULAR

## 2014-03-02 NOTE — Progress Notes (Signed)
Subjective:    Patient ID: Shelby Bradley, female    DOB: 1928/05/16, 80 y.o.   MRN: [id number]  HPI Comments: 80 yo WF presents for 3 month F/U for HTN, Cholesterol, Pre-Dm, D. deficient . She notes hair changes, weight changes, and more fatigue and is concerned about THYroid. She is keeping active. She is trying to eat better but cannot give up carbs. She notes BP good at home. She is due for B12 shot currently. CHOL         164   12/01/2013 HDL           42   12/01/2013 LDLCALC       85   12/01/2013 TRIG         183   12/01/2013 CHOLHDL      3.9   12/01/2013 ALT           10   12/01/2013 AST           20   12/01/2013 ALKPHOS       76   12/01/2013 BILITOT      0.3   12/01/2013 CREATININE     0.91   12/01/2013 BUN              18   12/01/2013 NA              136   12/01/2013 K               4.4   12/01/2013 CL              104   12/01/2013 CO2              24   12/01/2013 WBC      5.4   12/01/2013 HGB     11.2   12/01/2013 HCT     33.0   12/01/2013 MCV     87.1   12/01/2013 PLT      227   12/01/2013 HGBA1C      5.9   12/01/2013   Hypertension  Gastrophageal Reflux Associated symptoms include fatigue.     Medication List       This list is accurate as of: 03/02/14  3:38 PM.  Always use your most recent med list.               ALPRAZolam 0.25 MG tablet  Commonly known as:  XANAX  Take 0.25 mg by mouth at bedtime as needed for anxiety.     amitriptyline 50 MG tablet  Commonly known as:  ELAVIL  Take 1 tablet (50 mg total) by mouth at bedtime.     amLODipine 2.5 MG tablet  Commonly known as:  NORVASC  Take 2.5 mg by mouth daily.     cyanocobalamin 1000 MCG/ML injection  Commonly known as:  (VITAMIN B-12)  Inject 500 mcg into the muscle every 30 (thirty) days.     gemfibrozil 600 MG tablet  Commonly known as:  LOPID  Take 600 mg by mouth daily.     levothyroxine 125 MCG tablet  Commonly known as:  SYNTHROID, LEVOTHROID  Take 125 mcg by mouth daily before breakfast. 1 pill  times 5 days and 1 1/2 Tuesdays and Thursdays     omeprazole 40 MG capsule  Commonly known as:  PRILOSEC  Take 40 mg by mouth daily.     Vitamin D-3 5000 UNITS Tabs  Take 5,000 Units by mouth daily.  Review of Systems  Constitutional: Positive for fatigue.  Endocrine:       Hair change  All other systems reviewed and are negative.  BP 132/64  Pulse 82  Temp(Src) 98.2 F (36.8 C) (Temporal)  Resp 16  Ht 5\' 4"  (1.626 m)  Wt 128 lb (58.06 kg)  BMI 21.96 kg/m2     Objective:   Physical Exam  Nursing note and vitals reviewed. Constitutional: She is oriented to person, place, and time. She appears well-developed and well-nourished. No distress.  HENT:  Head: Normocephalic and atraumatic.  Right Ear: External ear normal.  Left Ear: External ear normal.  Nose: Nose normal.  Eyes: Conjunctivae and EOM are normal.  Neck: Normal range of motion. Neck supple. No JVD present. No thyromegaly present.  Cardiovascular: Normal rate, regular rhythm, normal heart sounds and intact distal pulses.   Pulmonary/Chest: Effort normal and breath sounds normal.  Abdominal: Soft. Bowel sounds are normal. She exhibits no distension. There is no tenderness. There is no rebound.  Musculoskeletal: Normal range of motion. She exhibits no edema and no tenderness.  Lymphadenopathy:    She has no cervical adenopathy.  Neurological: She is alert and oriented to person, place, and time. No cranial nerve deficit.  Skin: Skin is warm and dry. No rash noted. No erythema. No pallor.  Psychiatric: She has a normal mood and affect. Her behavior is normal. Judgment and thought content normal.          Assessment & Plan:  1.  3 month F/U for HTN, Cholesterol, Pre-Dm, D. Deficient. Needs healthy diet, cardio QD and obtain healthy weight. Check Labs, Check BP if >130/80 call office   2.  Hypothyroid vs b12 with symptoms- Recheck labs

## 2014-03-02 NOTE — Patient Instructions (Signed)
Vitamin B12 Deficiency Not having enough vitamin B12 is called a deficiency. Your body needs this vitamin for important body functions. HOME CARE  Take all vitamins, herbs, or nutrition drinks (supplements) as told by your doctor.  Get shots (injections) as told. Do not miss your doctor visit.  Eat foods than contain vitamin B12. This includes:  Meat.  Chicken, turkey, or other birds (poultry).  Fish.  Eggs.  Cereals or milk with added vitamin B12. Check the label.  Do not drink too much (abuse) alcohol.  Keep all doctor visits as told. GET HELP IF:  You have questions.  Your problems come back. MAKE SURE YOU:  Understand these instructions.  Will watch your condition.  Will get help right away if you are not doing well or get worse. Document Released: 10/09/2011 Document Revised: 01/12/2012 Document Reviewed: 10/09/2011 [employer] Patient Information 2014 [employer].  

## 2014-03-03 LAB — BASIC METABOLIC PANEL WITH GFR
BUN: 20 mg/dL (ref 6–23)
CHLORIDE: 107 meq/L (ref 96–112)
CO2: 25 mEq/L (ref 19–32)
Calcium: 9.4 mg/dL (ref 8.4–10.5)
Creat: 0.73 mg/dL (ref 0.50–1.10)
GFR, EST AFRICAN AMERICAN: 87 mL/min
GFR, EST NON AFRICAN AMERICAN: 76 mL/min
Glucose, Bld: 97 mg/dL (ref 70–99)
POTASSIUM: 4 meq/L (ref 3.5–5.3)
Sodium: 140 mEq/L (ref 135–145)

## 2014-03-03 LAB — HEMOGLOBIN A1C
HEMOGLOBIN A1C: 6.1 % — AB (ref <5.7)
Mean Plasma Glucose: 128 mg/dL — ABNORMAL HIGH (ref <117)

## 2014-03-03 LAB — LIPID PANEL
CHOLESTEROL: 154 mg/dL (ref 0–200)
HDL: 38 mg/dL — ABNORMAL LOW (ref >39)
LDL Cholesterol: 80 mg/dL (ref 0–99)
Total CHOL/HDL Ratio: 4.1 Ratio
Triglycerides: 179 mg/dL — ABNORMAL HIGH (ref <150)
VLDL: 36 mg/dL (ref 0–40)

## 2014-03-03 LAB — HEPATIC FUNCTION PANEL
ALT: 11 U/L (ref 0–35)
AST: 19 U/L (ref 0–37)
Albumin: 3.8 g/dL (ref 3.5–5.2)
Alkaline Phosphatase: 86 U/L (ref 39–117)
BILIRUBIN INDIRECT: 0.2 mg/dL (ref 0.2–1.2)
Bilirubin, Direct: 0.1 mg/dL (ref 0.0–0.3)
Total Bilirubin: 0.3 mg/dL (ref 0.2–1.2)
Total Protein: 6.2 g/dL (ref 6.0–8.3)

## 2014-03-03 LAB — TSH: TSH: 0.047 u[IU]/mL — AB (ref 0.350–4.500)

## 2014-03-03 LAB — VITAMIN B12: Vitamin B-12: 1266 pg/mL — ABNORMAL HIGH (ref 211–911)

## 2014-03-13 ENCOUNTER — Other Ambulatory Visit: Payer: Self-pay | Admitting: Emergency Medicine

## 2014-03-20 ENCOUNTER — Other Ambulatory Visit: Payer: Self-pay

## 2014-03-20 ENCOUNTER — Other Ambulatory Visit: Payer: Self-pay | Admitting: *Deleted

## 2014-03-20 MED ORDER — LEVOTHYROXINE SODIUM 125 MCG PO TABS: 125.0000 ug | ORAL_TABLET | Freq: Every day | ORAL | Status: DC

## 2014-03-20 MED ORDER — LEVOTHYROXINE SODIUM 125 MCG PO TABS
125.0000 ug | ORAL_TABLET | Freq: Every day | ORAL | Status: DC
Start: 2014-03-20 — End: 2016-01-18

## 2014-03-20 MED ORDER — OMEPRAZOLE 40 MG PO CPDR: 40.0000 mg | DELAYED_RELEASE_CAPSULE | Freq: Every day | ORAL | Status: DC

## 2014-04-03 ENCOUNTER — Ambulatory Visit (INDEPENDENT_AMBULATORY_CARE_PROVIDER_SITE_OTHER): Payer: Medicare Other

## 2014-04-03 VITALS — BP 132/64 | Wt 128.0 lb

## 2014-04-03 DIAGNOSIS — E039 Hypothyroidism, unspecified: Secondary | ICD-10-CM | POA: Diagnosis not present

## 2014-04-03 DIAGNOSIS — D649 Anemia, unspecified: Secondary | ICD-10-CM

## 2014-04-03 LAB — CBC WITH DIFFERENTIAL/PLATELET
BASOS ABS: 0 10*3/uL (ref 0.0–0.1)
BASOS PCT: 0 % (ref 0–1)
Eosinophils Absolute: 0 10*3/uL (ref 0.0–0.7)
Eosinophils Relative: 1 % (ref 0–5)
HEMATOCRIT: 29.2 % — AB (ref 36.0–46.0)
Hemoglobin: 9.9 g/dL — ABNORMAL LOW (ref 12.0–15.0)
LYMPHS PCT: 33 % (ref 12–46)
Lymphs Abs: 1.5 10*3/uL (ref 0.7–4.0)
MCH: 29.4 pg (ref 26.0–34.0)
MCHC: 33.9 g/dL (ref 30.0–36.0)
MCV: 86.6 fL (ref 78.0–100.0)
MONO ABS: 0.4 10*3/uL (ref 0.1–1.0)
Monocytes Relative: 8 % (ref 3–12)
Neutro Abs: 2.7 10*3/uL (ref 1.7–7.7)
Neutrophils Relative %: 58 % (ref 43–77)
Platelets: 199 10*3/uL (ref 150–400)
RBC: 3.37 MIL/uL — ABNORMAL LOW (ref 3.87–5.11)
RDW: 13.9 % (ref 11.5–15.5)
WBC: 4.6 10*3/uL (ref 4.0–10.5)

## 2014-04-03 MED ORDER — CYANOCOBALAMIN 1000 MCG/ML IJ SOLN
500.0000 ug | Freq: Once | INTRAMUSCULAR | Status: AC
  Administered 2014-04-03: 500 ug via INTRAMUSCULAR

## 2014-04-03 NOTE — Progress Notes (Signed)
Patient ID: Shelby Bradley, female   DOB: 1928/04/09, 80 y.o.   MRN: [id number] Patient here today to recheck TSH and CBC, also to receive a B12 injection. Patient verified that she is taking her Levothyroxine 125 mcg as directed, she is taking one daily except on Sunday. She did receive today Vitamin B12 , 0.5 ml IM left deltoid.

## 2014-04-04 LAB — TSH: TSH: 0.045 u[IU]/mL — ABNORMAL LOW (ref 0.350–4.500)

## 2014-05-04 ENCOUNTER — Ambulatory Visit (INDEPENDENT_AMBULATORY_CARE_PROVIDER_SITE_OTHER): Payer: Medicare Other | Admitting: *Deleted

## 2014-05-04 DIAGNOSIS — E538 Deficiency of other specified B group vitamins: Secondary | ICD-10-CM

## 2014-05-04 MED ORDER — CYANOCOBALAMIN 1000 MCG/ML IJ SOLN
500.0000 ug | Freq: Once | INTRAMUSCULAR | Status: AC
  Administered 2014-05-04: 500 ug via INTRAMUSCULAR

## 2014-05-04 NOTE — Progress Notes (Signed)
Patient ID: Shelby BishopSara W Bradley, female   DOB: [birth year], 80 y.o.   MRN: [id number] Patient presents for B12 injection.  Area was cleaned with alcohol and patient received 0.5 cc IM right deltoid.  Patient tolerated well.

## 2014-05-06 ENCOUNTER — Other Ambulatory Visit: Payer: Self-pay | Admitting: Physician Assistant

## 2014-05-29 ENCOUNTER — Encounter: Payer: Self-pay | Admitting: Physician Assistant

## 2014-05-29 ENCOUNTER — Ambulatory Visit (INDEPENDENT_AMBULATORY_CARE_PROVIDER_SITE_OTHER): Payer: Medicare Other | Admitting: Physician Assistant

## 2014-05-29 ENCOUNTER — Other Ambulatory Visit: Payer: Self-pay | Admitting: Physician Assistant

## 2014-05-29 VITALS — BP 110/70 | HR 76 | Temp 98.6°F | Resp 16 | Ht 64.0 in | Wt 129.0 lb

## 2014-05-29 DIAGNOSIS — E559 Vitamin D deficiency, unspecified: Secondary | ICD-10-CM

## 2014-05-29 DIAGNOSIS — E039 Hypothyroidism, unspecified: Secondary | ICD-10-CM | POA: Diagnosis not present

## 2014-05-29 DIAGNOSIS — Z79899 Other long term (current) drug therapy: Secondary | ICD-10-CM

## 2014-05-29 DIAGNOSIS — N3000 Acute cystitis without hematuria: Secondary | ICD-10-CM | POA: Diagnosis not present

## 2014-05-29 DIAGNOSIS — Z23 Encounter for immunization: Secondary | ICD-10-CM

## 2014-05-29 DIAGNOSIS — Z789 Other specified health status: Secondary | ICD-10-CM

## 2014-05-29 DIAGNOSIS — E538 Deficiency of other specified B group vitamins: Secondary | ICD-10-CM

## 2014-05-29 DIAGNOSIS — E785 Hyperlipidemia, unspecified: Secondary | ICD-10-CM

## 2014-05-29 DIAGNOSIS — I10 Essential (primary) hypertension: Secondary | ICD-10-CM

## 2014-05-29 DIAGNOSIS — Z1331 Encounter for screening for depression: Secondary | ICD-10-CM | POA: Diagnosis not present

## 2014-05-29 DIAGNOSIS — R7303 Prediabetes: Secondary | ICD-10-CM

## 2014-05-29 DIAGNOSIS — R7309 Other abnormal glucose: Secondary | ICD-10-CM | POA: Diagnosis not present

## 2014-05-29 DIAGNOSIS — Z0000 Encounter for general adult medical examination without abnormal findings: Secondary | ICD-10-CM

## 2014-05-29 LAB — LIPID PANEL
Cholesterol: 166 mg/dL (ref 0–200)
HDL: 42 mg/dL (ref >39)
LDL Cholesterol: 97 mg/dL (ref 0–99)
Total CHOL/HDL Ratio: 4 Ratio
Triglycerides: 135 mg/dL (ref <150)
VLDL: 27 mg/dL (ref 0–40)

## 2014-05-29 LAB — CBC WITH DIFFERENTIAL/PLATELET
BASOS PCT: 0 % (ref 0–1)
Basophils Absolute: 0 10*3/uL (ref 0.0–0.1)
EOS ABS: 0.1 10*3/uL (ref 0.0–0.7)
EOS PCT: 1 % (ref 0–5)
HCT: 29.3 % — ABNORMAL LOW (ref 36.0–46.0)
Hemoglobin: 10.1 g/dL — ABNORMAL LOW (ref 12.0–15.0)
Lymphocytes Relative: 31 % (ref 12–46)
Lymphs Abs: 1.9 10*3/uL (ref 0.7–4.0)
MCH: 29.7 pg (ref 26.0–34.0)
MCHC: 34.5 g/dL (ref 30.0–36.0)
MCV: 86.2 fL (ref 78.0–100.0)
MONO ABS: 0.5 10*3/uL (ref 0.1–1.0)
MONOS PCT: 9 % (ref 3–12)
NEUTROS PCT: 59 % (ref 43–77)
Neutro Abs: 3.6 10*3/uL (ref 1.7–7.7)
Platelets: 212 10*3/uL (ref 150–400)
RBC: 3.4 MIL/uL — ABNORMAL LOW (ref 3.87–5.11)
RDW: 14.3 % (ref 11.5–15.5)
WBC: 6.1 10*3/uL (ref 4.0–10.5)

## 2014-05-29 LAB — HEMOGLOBIN A1C
HEMOGLOBIN A1C: 5.7 % — AB (ref <5.7)
Mean Plasma Glucose: 117 mg/dL — ABNORMAL HIGH (ref <117)

## 2014-05-29 LAB — HEPATIC FUNCTION PANEL
ALT: 12 U/L (ref 0–35)
AST: 23 U/L (ref 0–37)
Albumin: 4.1 g/dL (ref 3.5–5.2)
Alkaline Phosphatase: 78 U/L (ref 39–117)
BILIRUBIN DIRECT: 0.1 mg/dL (ref 0.0–0.3)
Indirect Bilirubin: 0.3 mg/dL (ref 0.2–1.2)
TOTAL PROTEIN: 5.9 g/dL — AB (ref 6.0–8.3)
Total Bilirubin: 0.4 mg/dL (ref 0.2–1.2)

## 2014-05-29 LAB — BASIC METABOLIC PANEL WITH GFR
BUN: 24 mg/dL — ABNORMAL HIGH (ref 6–23)
CO2: 25 meq/L (ref 19–32)
CREATININE: 0.94 mg/dL (ref 0.50–1.10)
Calcium: 9.2 mg/dL (ref 8.4–10.5)
Chloride: 106 mEq/L (ref 96–112)
GFR, EST AFRICAN AMERICAN: 64 mL/min
GFR, Est Non African American: 56 mL/min — ABNORMAL LOW
GLUCOSE: 93 mg/dL (ref 70–99)
Potassium: 4.4 mEq/L (ref 3.5–5.3)
SODIUM: 136 meq/L (ref 135–145)

## 2014-05-29 LAB — TSH: TSH: 0.636 u[IU]/mL (ref 0.350–4.500)

## 2014-05-29 LAB — MAGNESIUM: MAGNESIUM: 1.9 mg/dL (ref 1.5–2.5)

## 2014-05-29 MED ORDER — CYANOCOBALAMIN 1000 MCG/ML IJ SOLN
500.0000 ug | Freq: Once | INTRAMUSCULAR | Status: AC
  Administered 2014-05-29: 500 ug via INTRAMUSCULAR

## 2014-05-29 MED ORDER — ALPRAZOLAM 0.25 MG PO TABS: 0.2500 mg | ORAL_TABLET | Freq: Every evening | ORAL | Status: DC | PRN

## 2014-05-29 NOTE — Progress Notes (Signed)
MEDICARE ANNUAL WELLNESS VISIT AND CPE  Assessment:   1. Essential hypertension - CBC with Differential - BASIC METABOLIC PANEL WITH GFR - Hepatic function panel - Urinalysis, Routine w reflex microscopic - Microalbumin / creatinine urine ratio - EKG 12-Lead - [place], RETROPERITNL ABD,  LTD  2. Pre-diabetes Discussed general issues about diabetes pathophysiology and management., Educational material distributed., Suggested low cholesterol diet., Encouraged aerobic exercise., Discussed foot care., Reminded to get yearly retinal exam. - Hemoglobin A1c - HM DIABETES FOOT EXAM  3. Hyperlipidemia - Lipid panel  4. Vitamin D deficiency Continue meds  5. Vitamin B12 deficiency Given IM B12 today  6. Need for vaccination with 13-polyvalent pneumococcal conjugate vaccine - Pneumococcal conjugate vaccine 13-valent IM  7. Unspecified hypothyroidism - TSH  8. Encounter for long-term (current) use of other medications - Magnesium  9.Screening for depression Negative  11. Patient had no falls in past year    Plan:   During the course of the visit the patient was educated and counseled about appropriate screening and preventive services including:    Pneumococcal vaccine   Influenza vaccine  Td vaccine  Screening electrocardiogram  Screening mammography  Bone densitometry screening  Colorectal cancer screening  Diabetes screening  Glaucoma screening  Nutrition counseling   Advanced directives: given information/requested  Screening recommendations, referrals:  Vaccinations: Tdap vaccine not indicated Influenza vaccine requested Pneumococcal vaccine needs Prevnar 13 Shingles vaccine not indicated Hep B vaccine not indicated  Nutrition assessed and recommended  Colonoscopy declined Mammogram declined Pap smear not indicated Pelvic exam not indicated Recommended yearly ophthalmology/optometry visit for glaucoma screening and checkup Recommended yearly  dental visit for hygiene and checkup Advanced directives - requested  Conditions/risks identified: BMI: Discussed weight loss, diet, and increase physical activity.  Increase physical activity: AHA recommends 150 minutes of physical activity a week.  Medications reviewed DEXA- declined, refuses treatment Diabetes at goal, ACE/ARB therapy Yes. Urinary Incontinence is not an issue: discussed non pharmacology and pharmacology options.  Fall risk: moderate- discussed PT, home fall assessment, medications.   Subjective:   LETRICIA Bradley is a 80 y.o. female who presents for Medicare Annual Wellness Visit and complete physical.    Date of last medicare wellness visit is unknown.  She has had elevated blood pressure for 30+ years.  Her blood pressure has been controlled at home, today their BP is BP: 110/70 mmHg She does workout, walks and mows yard with riding mower. She denies chest pain, shortness of breath, dizziness.  She is not on cholesterol medication and denies myalgias. Her cholesterol is at goal. The cholesterol last visit was:   Lab Results  Component Value Date   CHOL 154 03/02/2014   HDL 38* 03/02/2014   LDLCALC 80 03/02/2014   TRIG 179* 03/02/2014   CHOLHDL 4.1 03/02/2014    She has been working on diet and exercise for prediabetes, and denies paresthesia of the feet, polydipsia, polyuria and visual disturbances. Last A1C in the office was:  Lab Results  Component Value Date   HGBA1C 6.1* 03/02/2014   Patient is on Vitamin D supplement.   She is on thyroid medication. Her medication was changed last visit, 1 pill M,W,F, and half the rest of the week. Patient denies nervousness, palpitations and weight changes. She states she has had her hair falling out.  Lab Results  Component Value Date   TSH 0.045* 04/03/2014  .  She has chronic hearing loss in left ear from mastoid sx when she was younger, wears  bilateral hearing aids.  Names of Other Physician/Practitioners you currently  use: 1. [facility]- here for primary care 2. Dr. Dione BoozeGroat, eye doctor, once a year 3. Evelena Asae. Zigler, dentist, once a year Patient Care Team: Lucky CowboyWilliam McKeown, MD as PCP - General (Internal Medicine) Vertell NovakJames L Edwards Jr., MD as Consulting Physician (Gastroenterology) Sanjuana LettersBenjamin Graves, MD as Referring Physician (Orthopedic Surgery) Madilyn Hookhristopher L Groat, MD as Consulting Physician (Optometry)   Medication Review Current Outpatient Prescriptions on File Prior to Visit  Medication Sig Dispense Refill  . ALPRAZolam (XANAX) 0.25 MG tablet Take 0.25 mg by mouth at bedtime as needed for anxiety.      Marland Kitchen. amitriptyline (ELAVIL) 50 MG tablet TAKE 1 TABLET BY MOUTH AT BEDTIME  30 tablet  1  . amLODipine (NORVASC) 2.5 MG tablet Take 2.5 mg by mouth daily.      . Cholecalciferol (VITAMIN D-3) 5000 UNITS TABS Take 5,000 Units by mouth daily.      . cyanocobalamin (,VITAMIN B-12,) 1000 MCG/ML injection Inject 0.5 mLs (500 mcg total) into the muscle every 30 (thirty) days.  10 mL  2  . gemfibrozil (LOPID) 600 MG tablet Take 600 mg by mouth daily.      Marland Kitchen. levothyroxine (SYNTHROID, LEVOTHROID) 125 MCG tablet Take 1 tablet (125 mcg total) by mouth daily before breakfast. 1 pill times 5 days and 1 1/2 Tuesdays and Thursdays  90 tablet  2  . omeprazole (PRILOSEC) 40 MG capsule Take 1 capsule (40 mg total) by mouth daily.  90 capsule  2   No current facility-administered medications on file prior to visit.    Current Problems (verified) Patient Active Problem List   Diagnosis Date Noted  . Hypertension   . Hyperlipidemia   . Anxiety   . Anemia   . GERD (gastroesophageal reflux disease)   . Vitamin D deficiency   . Pre-diabetes     Screening Tests Health Maintenance  Topic Date Due  . Tetanus/tdap  02/14/1947  . Influenza Vaccine  06/03/2014  . Colonoscopy  11/03/2018  . Pneumococcal Polysaccharide Vaccine Age 10465 And Over  Completed  . Zostavax  Completed     Immunization History  Administered Date(s) Administered  . DTaP 05/20/2012  . Influenza Whole 07/21/2013  . Pneumococcal Polysaccharide-23 05/07/2010  . Zoster 11/03/2006   Preventative care: Last colonoscopy: 2010 Last mammogram: 2009 Last pap smear/pelvic exam: 2007 DEXA: 2013 + osteoporsis  Prior vaccinations: TD or Tdap: 2013  Influenza: 2014  Pneumococcal: 2011 Prevnar 13: DUE Shingles/Zostavax: 2008  History reviewed: allergies, current medications, past family history, past medical history, past social history, past surgical history and problem list  Risk Factors: Osteoporosis: postmenopausal estrogen deficiency and dietary calcium and/or vitamin D deficiency History of fracture in the past year: no  Tobacco History  Substance Use Topics  . Smoking status: Never Smoker   . Smokeless tobacco: Not on file  . Alcohol Use: No   She does not smoke.  Patient is not a former smoker. Are there smokers in your home (other than you)?  No  Alcohol Current alcohol use: none  Caffeine Current caffeine use: coffee 2 /day  Exercise  Current exercise: walking and yard work  Nutrition/Diet Current diet: in general, a "healthy" diet    Cardiac risk factors: advanced age (older than 7555 for men, 8965 for women), dyslipidemia, hypertension and sedentary lifestyle.  Depression Screen (Note: if answer to either of the following is "Yes", a more complete depression screening is indicated)  Q1: Over the past two weeks, have you felt down, depressed or hopeless? No  Q2: Over the past two weeks, have you felt little interest or pleasure in doing things? No  Have you lost interest or pleasure in daily life? No  Do you often feel hopeless? No  Do you cry easily over simple problems? No  Activities of Daily Living In your present state of health, do you have any difficulty performing the following activities?:  Driving? No Managing money?  No Feeding yourself? No Getting  from bed to chair? No Climbing a flight of stairs? Yes- has a ramp at her house Preparing food and eating?: No Bathing or showering? No Getting dressed: No Getting to the toilet? No Using the toilet:No Moving around from place to place: No In the past year have you fallen or had a near fall?:No   Are you sexually active?  No  Do you have more than one partner?  No  Vision Difficulties: No  Hearing Difficulties: Yes Do you often ask people to speak up or repeat themselves? Yes Do you experience ringing or noises in your ears? No Do you have difficulty understanding soft or whispered voices? Yes  Cognition  Do you feel that you have a problem with memory?No  Do you often misplace items? No  Do you feel safe at home?  Yes  Advanced directives Does patient have a Health Care Power of Attorney? Yes Does patient have a Living Will? Yes   Objective:     Blood pressure 110/70, pulse 76, temperature 98.6 F (37 C), resp. rate 16, height 5\' 4"  (1.626 m), weight 129 lb (58.514 kg). Body mass index is 22.13 kg/(m^2).  General appearance: alert, no distress, WD/WN,  female Cognitive Testing  Alert? Yes  Normal Appearance?Yes  Oriented to person? Yes  Place? Yes   Time? Yes  Recall of three objects?  Yes  Can perform simple calculations? Yes  Displays appropriate judgment?Yes  Can read the correct time from a watch face?Yes  HEENT: Left eye ptosis, normocephalic, sclerae anicteric, TM pearly on left, right TM white/scarred, nares patent, no discharge or erythema, pharynx normal Oral cavity: MMM, no lesions Neck: supple, no lymphadenopathy, no thyromegaly, no masses Heart: RRR, normal S1, S2, no murmurs Lungs: CTA bilaterally, no wheezes, rhonchi, or rales Abdomen: +bs, soft, non tender, non distended, no masses, no hepatomegaly, no splenomegaly Musculoskeletal: nontender, no swelling, no obvious deformity Extremities: no edema, no cyanosis, no clubbing Pulses: 2+ symmetric,  upper and lower extremities, normal cap refill Neurological: alert, oriented x 3, CN2-12 intact, strength normal upper extremities and lower extremities, sensation normal throughout, DTRs 2+ throughout, no cerebellar signs, gait normal Psychiatric: normal affect, behavior normal, pleasant  Breast: nontender, no masses or lumps, no skin changes, no nipple discharge or inversion, no axillary lymphadenopathy Gyn: defer  Rectal: defer  Medicare Attestation I have personally reviewed: The patient's medical and social history Their use of alcohol, tobacco or illicit drugs Their current medications and supplements The patient's functional ability including ADLs,fall risks, home safety risks, cognitive, and hearing and visual impairment Diet and physical activities Evidence for depression or mood disorders  The patient's weight, height, BMI, and visual acuity have been recorded in the chart.  I have made referrals, counseling, and provided education to the patient based on review of the above and I have provided the patient with a written personalized care plan for preventive services.     Quentin Mulling, PA-C   05/29/2014

## 2014-05-29 NOTE — Patient Instructions (Signed)

## 2014-05-30 LAB — URINALYSIS, ROUTINE W REFLEX MICROSCOPIC
BILIRUBIN URINE: NEGATIVE
Glucose, UA: NEGATIVE mg/dL
KETONES UR: NEGATIVE mg/dL
NITRITE: NEGATIVE
PROTEIN: NEGATIVE mg/dL
Specific Gravity, Urine: 1.01 (ref 1.005–1.030)
UROBILINOGEN UA: 0.2 mg/dL (ref 0.0–1.0)
pH: 5 (ref 5.0–8.0)

## 2014-05-30 LAB — URINALYSIS, MICROSCOPIC ONLY
BACTERIA UA: NONE SEEN
CASTS: NONE SEEN
CRYSTALS: NONE SEEN
Squamous Epithelial / LPF: NONE SEEN

## 2014-05-30 LAB — MICROALBUMIN / CREATININE URINE RATIO
Creatinine, Urine: 34.2 mg/dL
Microalb Creat Ratio: 14.6 mg/g (ref 0.0–30.0)
Microalb, Ur: 0.5 mg/dL (ref 0.00–1.89)

## 2014-06-01 LAB — URINE CULTURE: Colony Count: 85000

## 2014-06-02 MED ORDER — CIPROFLOXACIN HCL 500 MG PO TABS: 500.0000 mg | ORAL_TABLET | Freq: Two times a day (BID) | ORAL | Status: AC

## 2014-06-02 NOTE — Addendum Note (Signed)
Addended by: Quentin MullingOLLIER, Doye Montilla R on: 06/02/2014 08:29 AM   Modules accepted: Orders

## 2014-06-12 ENCOUNTER — Other Ambulatory Visit: Payer: Self-pay | Admitting: Emergency Medicine

## 2014-06-12 ENCOUNTER — Other Ambulatory Visit: Payer: Self-pay | Admitting: Physician Assistant

## 2014-06-29 ENCOUNTER — Ambulatory Visit (INDEPENDENT_AMBULATORY_CARE_PROVIDER_SITE_OTHER): Payer: Medicare Other | Admitting: *Deleted

## 2014-06-29 DIAGNOSIS — N3000 Acute cystitis without hematuria: Secondary | ICD-10-CM | POA: Diagnosis not present

## 2014-06-29 DIAGNOSIS — Z23 Encounter for immunization: Secondary | ICD-10-CM | POA: Diagnosis not present

## 2014-06-29 DIAGNOSIS — E538 Deficiency of other specified B group vitamins: Secondary | ICD-10-CM | POA: Diagnosis not present

## 2014-06-29 MED ORDER — CYANOCOBALAMIN 1000 MCG/ML IJ SOLN
500.0000 ug | Freq: Once | INTRAMUSCULAR | Status: AC
  Administered 2014-06-29: 500 ug via INTRAMUSCULAR

## 2014-06-30 LAB — URINALYSIS, ROUTINE W REFLEX MICROSCOPIC
Bilirubin Urine: NEGATIVE
GLUCOSE, UA: NEGATIVE mg/dL
HGB URINE DIPSTICK: NEGATIVE
KETONES UR: NEGATIVE mg/dL
Leukocytes, UA: NEGATIVE
Nitrite: NEGATIVE
PH: 5.5 (ref 5.0–8.0)
Protein, ur: NEGATIVE mg/dL
Specific Gravity, Urine: <1.005 — ABNORMAL LOW (ref 1.005–1.030)
Urobilinogen, UA: 0.2 mg/dL (ref 0.0–1.0)

## 2014-07-01 LAB — URINE CULTURE: Colony Count: 5000

## 2014-08-03 ENCOUNTER — Ambulatory Visit (INDEPENDENT_AMBULATORY_CARE_PROVIDER_SITE_OTHER): Payer: Medicare Other | Admitting: *Deleted

## 2014-08-03 DIAGNOSIS — E538 Deficiency of other specified B group vitamins: Secondary | ICD-10-CM

## 2014-08-03 MED ORDER — CYANOCOBALAMIN 1000 MCG/ML IJ SOLN
500.0000 ug | Freq: Once | INTRAMUSCULAR | Status: AC
  Administered 2014-08-03: 500 ug via INTRAMUSCULAR

## 2014-08-03 NOTE — Progress Notes (Signed)
Patient ID: Shelby BishopSara W Brunner, female   DOB: 1928-03-08, 80 y.o.   MRN: [id number] Patient presents for NV for B12 injection for B12 def.  Patient received 0.5 mL IM right deltoid.  Patient tolerated well and will f/u AD for next injection.

## 2014-09-07 ENCOUNTER — Ambulatory Visit: Payer: Medicare Other

## 2014-09-07 ENCOUNTER — Ambulatory Visit (INDEPENDENT_AMBULATORY_CARE_PROVIDER_SITE_OTHER): Payer: Medicare Other

## 2014-09-07 VITALS — BP 122/72 | HR 68 | Temp 98.2°F | Resp 16 | Wt 130.0 lb

## 2014-09-07 DIAGNOSIS — E538 Deficiency of other specified B group vitamins: Secondary | ICD-10-CM

## 2014-09-07 MED ORDER — CYANOCOBALAMIN 1000 MCG/ML IJ SOLN
500.0000 ug | Freq: Once | INTRAMUSCULAR | Status: AC
  Administered 2014-09-07: 500 ug via INTRAMUSCULAR

## 2014-10-05 ENCOUNTER — Ambulatory Visit (INDEPENDENT_AMBULATORY_CARE_PROVIDER_SITE_OTHER): Payer: Medicare Other

## 2014-10-05 ENCOUNTER — Ambulatory Visit: Payer: Self-pay

## 2014-10-05 VITALS — BP 120/68 | HR 72 | Temp 98.1°F | Resp 16 | Ht 64.0 in | Wt 132.0 lb

## 2014-10-05 DIAGNOSIS — D510 Vitamin B12 deficiency anemia due to intrinsic factor deficiency: Secondary | ICD-10-CM

## 2014-10-05 MED ORDER — CYANOCOBALAMIN 1000 MCG/ML IJ SOLN
500.0000 ug | Freq: Once | INTRAMUSCULAR | Status: AC
  Administered 2014-10-05: 500 ug via INTRAMUSCULAR

## 2014-10-05 MED ORDER — CYANOCOBALAMIN 1000 MCG/ML IJ SOLN: 1000.0000 ug | Freq: Once | INTRAMUSCULAR | Status: DC

## 2014-10-05 NOTE — Progress Notes (Signed)
Patient ID: Shelby BishopSara W Felty, female   DOB: 1928-04-17, 80 y.o.   MRN: [id number] Patient here today for her B12 injection, patient received B12 0.5 ml IM right deltoid. Patient tolerated well.

## 2014-10-12 ENCOUNTER — Other Ambulatory Visit: Payer: Self-pay | Admitting: *Deleted

## 2014-10-12 MED ORDER — AMLODIPINE BESYLATE 2.5 MG PO TABS: 2.5000 mg | ORAL_TABLET | Freq: Every day | ORAL | Status: DC

## 2014-10-25 ENCOUNTER — Other Ambulatory Visit: Payer: Self-pay | Admitting: Physician Assistant

## 2014-11-09 ENCOUNTER — Ambulatory Visit (INDEPENDENT_AMBULATORY_CARE_PROVIDER_SITE_OTHER): Payer: Medicare Other | Admitting: *Deleted

## 2014-11-09 DIAGNOSIS — E538 Deficiency of other specified B group vitamins: Secondary | ICD-10-CM

## 2014-11-09 MED ORDER — CYANOCOBALAMIN 1000 MCG/ML IJ SOLN
500.0000 ug | Freq: Once | INTRAMUSCULAR | Status: AC
  Administered 2014-11-09: 500 ug via INTRAMUSCULAR

## 2014-11-09 NOTE — Progress Notes (Signed)
Patient ID: Shelby Bradley, female   DOB: [birth year], 81 y.o.   MRN: [id number] Patient presents for B12 injection.  Patient received 0.5 cc IM left deltoid.

## 2014-12-05 ENCOUNTER — Encounter: Payer: Self-pay | Admitting: Internal Medicine

## 2014-12-05 ENCOUNTER — Ambulatory Visit (INDEPENDENT_AMBULATORY_CARE_PROVIDER_SITE_OTHER): Payer: Medicare Other | Admitting: Internal Medicine

## 2014-12-05 VITALS — BP 142/64 | HR 76 | Temp 97.8°F | Resp 16 | Ht 64.0 in | Wt 127.8 lb

## 2014-12-05 DIAGNOSIS — I10 Essential (primary) hypertension: Secondary | ICD-10-CM

## 2014-12-05 DIAGNOSIS — D519 Vitamin B12 deficiency anemia, unspecified: Secondary | ICD-10-CM

## 2014-12-05 DIAGNOSIS — E785 Hyperlipidemia, unspecified: Secondary | ICD-10-CM | POA: Diagnosis not present

## 2014-12-05 DIAGNOSIS — R7309 Other abnormal glucose: Secondary | ICD-10-CM

## 2014-12-05 DIAGNOSIS — Z23 Encounter for immunization: Secondary | ICD-10-CM | POA: Diagnosis not present

## 2014-12-05 DIAGNOSIS — E559 Vitamin D deficiency, unspecified: Secondary | ICD-10-CM

## 2014-12-05 DIAGNOSIS — Z79899 Other long term (current) drug therapy: Secondary | ICD-10-CM

## 2014-12-05 DIAGNOSIS — K219 Gastro-esophageal reflux disease without esophagitis: Secondary | ICD-10-CM

## 2014-12-05 DIAGNOSIS — R7303 Prediabetes: Secondary | ICD-10-CM

## 2014-12-05 LAB — CBC WITH DIFFERENTIAL/PLATELET
BASOS ABS: 0 10*3/uL (ref 0.0–0.1)
Basophils Relative: 0 % (ref 0–1)
EOS ABS: 0.1 10*3/uL (ref 0.0–0.7)
Eosinophils Relative: 1 % (ref 0–5)
HCT: 32.5 % — ABNORMAL LOW (ref 36.0–46.0)
HEMOGLOBIN: 10.4 g/dL — AB (ref 12.0–15.0)
LYMPHS ABS: 1.2 10*3/uL (ref 0.7–4.0)
LYMPHS PCT: 23 % (ref 12–46)
MCH: 29 pg (ref 26.0–34.0)
MCHC: 32 g/dL (ref 30.0–36.0)
MCV: 90.5 fL (ref 78.0–100.0)
MONO ABS: 0.4 10*3/uL (ref 0.1–1.0)
MONOS PCT: 7 % (ref 3–12)
MPV: 9.1 fL (ref 8.6–12.4)
Neutro Abs: 3.7 10*3/uL (ref 1.7–7.7)
Neutrophils Relative %: 69 % (ref 43–77)
PLATELETS: 190 10*3/uL (ref 150–400)
RBC: 3.59 MIL/uL — AB (ref 3.87–5.11)
RDW: 14 % (ref 11.5–15.5)
WBC: 5.3 10*3/uL (ref 4.0–10.5)

## 2014-12-05 MED ORDER — CYANOCOBALAMIN 1000 MCG/ML IJ SOLN
500.0000 ug | Freq: Once | INTRAMUSCULAR | Status: AC
Start: 2014-12-05 — End: 2014-12-05
  Administered 2014-12-05: 500 ug via INTRAMUSCULAR

## 2014-12-05 NOTE — Progress Notes (Signed)
Patient ID: Shelby Bradley, female   DOB: 09/12/1928, 81 y.o.   MRN: [id number]   This very nice 81 y.o.female presents for 3 month follow up with Hypertension, Hyperlipidemia, Pre-Diabetes and Vitamin D Deficiency.    Patient is treated for HTN (1987) & BP has been controlled at home. Today's BP is 142/64. Marland Kitchen Patient has had no complaints of any cardiac type chest pain, palpitations, dyspnea/orthopnea/PND, dizziness, claudication, or dependent edema.   Hyperlipidemia is controlled with diet & meds. Patient denies myalgias or other med SE's. Last Lipids were 05/29/2014: Cholesterol, Total 166; HDL Cholesterol by NMR 42; LDL (calc) 97; Triglycerides 135 on    Also, the patient has history of PreDiabetes and has had no symptoms of reactive hypoglycemia, diabetic polys, paresthesias or visual blurring.  Last A1c was 5.7% on  05/29/2014.   Further, the patient also has history of Vitamin D Deficiency and supplements vitamin D without any suspected side-effects. Last vitamin D was 38 in Oct 2014.  Medication Sig  . ALPRAZolam  0.25 MG tablet Take 1 tablet (0.25 mg total) by mouth at bedtime as needed for anxiety.  Marland Kitchen amitriptyline  50 MG tablet TAKE 1 TABLET BY MOUTH AT BEDTIME  . amLODipine  2.5 MG tablet Take 1 tablet (2.5 mg total) by mouth daily.  Marland Kitchen VITAMIN D 5000 UNITS  Take 5,000 Units by mouth daily.  Marland Kitchen VITAMIN B-12 1000 MCG/ML inj INJECT 0.5 MLS (500 MCG TOTAL) INTO THE MUSCLE EVERY 30 (THIRTY) DAYS.  Marland Kitchen gemfibrozil 600 MG tablet Take 600 mg by mouth daily.  Marland Kitchen levothyroxine  125 MCG tablet . 1 pill - 5 days and 1 1/2 pill - 2 days T  . omeprazole  40 MG capsule Take 1 capsule (40 mg total) by mouth daily.   Allergies  Allergen Reactions  . Amlodipine Swelling    Swelling/edema  . Norvasc [Amlodipine Besylate]   . Sulfa Antibiotics    PMHx:   Past Medical History  Diagnosis Date  . Hypertension   . Hyperlipidemia   . Thyroid disease   . Anxiety   . Depression   . Anemia   . GERD  (gastroesophageal reflux disease)   . Allergy   . DDD (degenerative disc disease), lumbar   . OA (osteoarthritis) of knee     right knee  . Vitamin D deficiency   . Pre-diabetes    Immunization History  Administered Date(s) Administered  . DTaP 05/20/2012  . Influenza Whole 07/21/2013  . Influenza, High Dose Seasonal PF 06/29/2014  . Pneumococcal Conjugate-13 05/29/2014  . Pneumococcal Polysaccharide-23 05/07/2010, 12/05/2014  . Zoster 11/03/2006   Past Surgical History  Procedure Laterality Date  . Knee arthroscopy Left   . Cataract extraction      both   FHx:    Reviewed / unchanged  SHx:    Reviewed / unchanged  Systems Review:  Constitutional: Denies fever, chills, wt changes, headaches, insomnia, fatigue, night sweats, change in appetite. Eyes: Denies redness, blurred vision, diplopia, discharge, itchy, watery eyes.  ENT: Denies discharge, congestion, post nasal drip, epistaxis, sore throat, earache, hearing loss, dental pain, tinnitus, vertigo, sinus pain, snoring.  CV: Denies chest pain, palpitations, irregular heartbeat, syncope, dyspnea, diaphoresis, orthopnea, PND, claudication or edema. Respiratory: denies cough, dyspnea, DOE, pleurisy, hoarseness, laryngitis, wheezing.  Gastrointestinal: Denies dysphagia, odynophagia, heartburn, reflux, water brash, abdominal pain or cramps, nausea, vomiting, bloating, diarrhea, constipation, hematemesis, melena, hematochezia  or hemorrhoids. Genitourinary: Denies dysuria, frequency, urgency, nocturia, hesitancy, discharge, hematuria or flank  pain. Musculoskeletal: Denies arthralgias, myalgias, stiffness, jt. swelling, pain, limping or strain/sprain.  Skin: Denies pruritus, rash, hives, warts, acne, eczema or change in skin lesion(s). Neuro: No weakness, tremor, incoordination, spasms, paresthesia or pain. Psychiatric: Denies confusion, memory loss or sensory loss. Endo: Denies change in weight, skin or hair change.  Heme/Lymph: No  excessive bleeding, bruising or enlarged lymph nodes.  Physical Exam  BP 142/64   Pulse 76  Temp97.8 F   Resp 16  Ht 5\' 4"  Wt 127 lb 12.8 oz     BMI 21.93   Appears well nourished and in no distress. Eyes: PERRLA, EOMs, conjunctiva no swelling or erythema. Sinuses: No frontal/maxillary tenderness ENT/Mouth: EAC's clear, TM's nl w/o erythema, bulging. Nares clear w/o erythema, swelling, exudates. Oropharynx clear without erythema or exudates. Oral hygiene is good. Tongue normal, non obstructing. Hearing intact.  Neck: Supple. Thyroid nl. Car 2+/2+ without bruits, nodes or JVD. Chest: Respirations nl with BS clear & equal w/o rales, rhonchi, wheezing or stridor.  Cor: Heart sounds normal w/ regular rate and rhythm without sig. murmurs, gallops, clicks, or rubs. Peripheral pulses normal and equal  without edema.  Abdomen: Soft & bowel sounds normal. Non-tender w/o guarding, rebound, hernias, masses, or organomegaly.  Lymphatics: Unremarkable.  Musculoskeletal: Full ROM all peripheral extremities, joint stability, 5/5 strength, and normal gait.  Skin: Warm, dry without exposed rashes, lesions or ecchymosis apparent.  Neuro: Cranial nerves intact, reflexes equal bilaterally. Sensory-motor testing grossly intact. Tendon reflexes grossly intact.  Pysch: Alert & oriented x 3.  Insight and judgement nl & appropriate. No ideations.  Assessment and Plan:  1. Essential hypertension  - TSH  2. Hyperlipidemia  - Lipid panel  3. Pre-diabetes  - Hemoglobin A1c - Insulin, fasting  4. Vitamin D deficiency  - Vit D  25 hydroxy (rtn osteoporosis monitoring) - cyanocobalamin ((VITAMIN B-12)) injection 500 mcg; Inject 0.5 mLs (500 mcg total) into the muscle once.  5. Medication management   6. Gastroesophageal reflux disease, esophagitis presence not specified  - CBC with Differential/Platelet - BASIC METABOLIC PANEL WITH GFR - Hepatic function panel - Magnesium  7. Need for  prophylactic vaccination against Streptococcus pneumoniae (pneumococcus)  - Pneumococcal polysaccharide vaccine 23-valent greater than or equal to 2yo subcutaneous/IM  8. Anemia due to vitamin B12 deficiency   Recommended regular exercise, BP monitoring, weight control, and discussed med and SE's. Recommended labs to assess and monitor clinical status. Further disposition pending results of labs.

## 2014-12-05 NOTE — Patient Instructions (Signed)

## 2014-12-06 LAB — LIPID PANEL
Cholesterol: 154 mg/dL (ref 0–200)
HDL: 39 mg/dL — ABNORMAL LOW (ref >39)
LDL Cholesterol: 83 mg/dL (ref 0–99)
Total CHOL/HDL Ratio: 3.9 Ratio
Triglycerides: 158 mg/dL — ABNORMAL HIGH (ref <150)
VLDL: 32 mg/dL (ref 0–40)

## 2014-12-06 LAB — BASIC METABOLIC PANEL WITHOUT GFR
BUN: 19 mg/dL (ref 6–23)
CO2: 22 meq/L (ref 19–32)
Calcium: 9.1 mg/dL (ref 8.4–10.5)
Chloride: 104 meq/L (ref 96–112)
Creat: 0.96 mg/dL (ref 0.50–1.10)
GFR, Est African American: 63 mL/min
GFR, Est Non African American: 54 mL/min — ABNORMAL LOW
Glucose, Bld: 104 mg/dL — ABNORMAL HIGH (ref 70–99)
Potassium: 4.3 meq/L (ref 3.5–5.3)
Sodium: 133 meq/L — ABNORMAL LOW (ref 135–145)

## 2014-12-06 LAB — HEPATIC FUNCTION PANEL
ALT: 8 U/L (ref 0–35)
AST: 18 U/L (ref 0–37)
Albumin: 3.4 g/dL — ABNORMAL LOW (ref 3.5–5.2)
Alkaline Phosphatase: 65 U/L (ref 39–117)
Bilirubin, Direct: 0.1 mg/dL (ref 0.0–0.3)
Indirect Bilirubin: 0.2 mg/dL (ref 0.2–1.2)
Total Bilirubin: 0.3 mg/dL (ref 0.2–1.2)
Total Protein: 5.9 g/dL — ABNORMAL LOW (ref 6.0–8.3)

## 2014-12-06 LAB — VITAMIN D 25 HYDROXY (VIT D DEFICIENCY, FRACTURES): VIT D 25 HYDROXY: 27 ng/mL — AB (ref 30–100)

## 2014-12-06 LAB — HEMOGLOBIN A1C
HEMOGLOBIN A1C: 5.7 % — AB (ref <5.7)
Mean Plasma Glucose: 117 mg/dL — ABNORMAL HIGH (ref <117)

## 2014-12-06 LAB — MAGNESIUM: Magnesium: 1.8 mg/dL (ref 1.5–2.5)

## 2014-12-06 LAB — INSULIN, FASTING: Insulin fasting, serum: 11.9 u[IU]/mL (ref 2.0–19.6)

## 2014-12-06 LAB — TSH: TSH: 4.493 u[IU]/mL (ref 0.350–4.500)

## 2015-01-03 ENCOUNTER — Ambulatory Visit (INDEPENDENT_AMBULATORY_CARE_PROVIDER_SITE_OTHER): Payer: Medicare Other

## 2015-01-03 VITALS — BP 132/76 | HR 76 | Temp 97.7°F | Resp 16 | Ht 64.0 in | Wt 127.0 lb

## 2015-01-03 DIAGNOSIS — D649 Anemia, unspecified: Secondary | ICD-10-CM

## 2015-01-03 MED ORDER — CYANOCOBALAMIN 1000 MCG/ML IJ SOLN
500.0000 ug | Freq: Once | INTRAMUSCULAR | Status: AC
  Administered 2015-01-03: 500 ug via INTRAMUSCULAR

## 2015-01-03 NOTE — Progress Notes (Signed)
Patient ID: Shelby BishopSara W Bradley, female   DOB: 11/06/1927, 81 y.o.   MRN: [id number] Patient here today for B12 injection. Patient received 0.5 ML [place] Left arm, patient tolerated well.

## 2015-01-04 ENCOUNTER — Ambulatory Visit: Payer: Self-pay

## 2015-02-08 ENCOUNTER — Ambulatory Visit (INDEPENDENT_AMBULATORY_CARE_PROVIDER_SITE_OTHER): Payer: Medicare Other

## 2015-02-08 ENCOUNTER — Other Ambulatory Visit: Payer: Self-pay

## 2015-02-08 VITALS — BP 138/72 | HR 84 | Temp 97.7°F | Resp 16 | Ht 64.0 in | Wt 124.0 lb

## 2015-02-08 DIAGNOSIS — D510 Vitamin B12 deficiency anemia due to intrinsic factor deficiency: Secondary | ICD-10-CM

## 2015-02-08 MED ORDER — CYANOCOBALAMIN 1000 MCG/ML IJ SOLN
500.0000 ug | Freq: Once | INTRAMUSCULAR | Status: AC
  Administered 2015-02-08: 500 ug via INTRAMUSCULAR

## 2015-02-08 MED ORDER — OMEPRAZOLE 40 MG PO CPDR: 40.0000 mg | DELAYED_RELEASE_CAPSULE | Freq: Every day | ORAL | Status: DC

## 2015-02-08 MED ORDER — ALPRAZOLAM 0.25 MG PO TABS: 0.2500 mg | ORAL_TABLET | Freq: Every evening | ORAL | Status: DC | PRN

## 2015-03-01 ENCOUNTER — Other Ambulatory Visit: Payer: Self-pay | Admitting: Physician Assistant

## 2015-03-08 ENCOUNTER — Ambulatory Visit (INDEPENDENT_AMBULATORY_CARE_PROVIDER_SITE_OTHER): Payer: Medicare Other | Admitting: Physician Assistant

## 2015-03-08 ENCOUNTER — Encounter: Payer: Self-pay | Admitting: Physician Assistant

## 2015-03-08 VITALS — BP 132/72 | HR 80 | Temp 97.9°F | Resp 16 | Ht 64.0 in | Wt 122.0 lb

## 2015-03-08 DIAGNOSIS — I10 Essential (primary) hypertension: Secondary | ICD-10-CM

## 2015-03-08 DIAGNOSIS — Z79899 Other long term (current) drug therapy: Secondary | ICD-10-CM

## 2015-03-08 DIAGNOSIS — D519 Vitamin B12 deficiency anemia, unspecified: Secondary | ICD-10-CM | POA: Diagnosis not present

## 2015-03-08 DIAGNOSIS — E559 Vitamin D deficiency, unspecified: Secondary | ICD-10-CM | POA: Diagnosis not present

## 2015-03-08 DIAGNOSIS — R7309 Other abnormal glucose: Secondary | ICD-10-CM | POA: Diagnosis not present

## 2015-03-08 DIAGNOSIS — D510 Vitamin B12 deficiency anemia due to intrinsic factor deficiency: Secondary | ICD-10-CM

## 2015-03-08 DIAGNOSIS — R7303 Prediabetes: Secondary | ICD-10-CM

## 2015-03-08 DIAGNOSIS — E785 Hyperlipidemia, unspecified: Secondary | ICD-10-CM | POA: Diagnosis not present

## 2015-03-08 LAB — BASIC METABOLIC PANEL WITH GFR
BUN: 17 mg/dL (ref 6–23)
CO2: 22 mEq/L (ref 19–32)
Calcium: 9.3 mg/dL (ref 8.4–10.5)
Chloride: 98 mEq/L (ref 96–112)
Creat: 1.14 mg/dL — ABNORMAL HIGH (ref 0.50–1.10)
GFR, EST AFRICAN AMERICAN: 51 mL/min — AB
GFR, EST NON AFRICAN AMERICAN: 44 mL/min — AB
Glucose, Bld: 112 mg/dL — ABNORMAL HIGH (ref 70–99)
POTASSIUM: 5.2 meq/L (ref 3.5–5.3)
Sodium: 132 mEq/L — ABNORMAL LOW (ref 135–145)

## 2015-03-08 LAB — LIPID PANEL
Cholesterol: 159 mg/dL (ref 0–200)
HDL: 37 mg/dL — ABNORMAL LOW (ref >=46)
LDL CALC: 91 mg/dL (ref 0–99)
Total CHOL/HDL Ratio: 4.3 Ratio
Triglycerides: 157 mg/dL — ABNORMAL HIGH (ref <150)
VLDL: 31 mg/dL (ref 0–40)

## 2015-03-08 LAB — HEPATIC FUNCTION PANEL
ALBUMIN: 3.7 g/dL (ref 3.5–5.2)
ALK PHOS: 70 U/L (ref 39–117)
ALT: 9 U/L (ref 0–35)
AST: 17 U/L (ref 0–37)
BILIRUBIN INDIRECT: 0.2 mg/dL (ref 0.2–1.2)
Bilirubin, Direct: 0.1 mg/dL (ref 0.0–0.3)
TOTAL PROTEIN: 6.6 g/dL (ref 6.0–8.3)
Total Bilirubin: 0.3 mg/dL (ref 0.2–1.2)

## 2015-03-08 LAB — CBC WITH DIFFERENTIAL/PLATELET
BASOS ABS: 0 10*3/uL (ref 0.0–0.1)
Basophils Relative: 0 % (ref 0–1)
EOS PCT: 1 % (ref 0–5)
Eosinophils Absolute: 0.1 10*3/uL (ref 0.0–0.7)
HCT: 30.8 % — ABNORMAL LOW (ref 36.0–46.0)
Hemoglobin: 10.3 g/dL — ABNORMAL LOW (ref 12.0–15.0)
LYMPHS PCT: 19 % (ref 12–46)
Lymphs Abs: 1.1 10*3/uL (ref 0.7–4.0)
MCH: 29.6 pg (ref 26.0–34.0)
MCHC: 33.4 g/dL (ref 30.0–36.0)
MCV: 88.5 fL (ref 78.0–100.0)
MONO ABS: 0.4 10*3/uL (ref 0.1–1.0)
MPV: 8.4 fL — AB (ref 8.6–12.4)
Monocytes Relative: 7 % (ref 3–12)
Neutro Abs: 4.1 10*3/uL (ref 1.7–7.7)
Neutrophils Relative %: 73 % (ref 43–77)
PLATELETS: 240 10*3/uL (ref 150–400)
RBC: 3.48 MIL/uL — AB (ref 3.87–5.11)
RDW: 14.7 % (ref 11.5–15.5)
WBC: 5.6 10*3/uL (ref 4.0–10.5)

## 2015-03-08 LAB — HEMOGLOBIN A1C
Hgb A1c MFr Bld: 6.1 % — ABNORMAL HIGH (ref <5.7)
MEAN PLASMA GLUCOSE: 128 mg/dL — AB (ref <117)

## 2015-03-08 LAB — MAGNESIUM: MAGNESIUM: 1.8 mg/dL (ref 1.5–2.5)

## 2015-03-08 LAB — TSH: TSH: 11.152 u[IU]/mL — ABNORMAL HIGH (ref 0.350–4.500)

## 2015-03-08 MED ORDER — CYANOCOBALAMIN 1000 MCG/ML IJ SOLN
500.0000 ug | Freq: Once | INTRAMUSCULAR | Status: AC
  Administered 2015-03-08: 500 ug via INTRAMUSCULAR

## 2015-03-08 NOTE — Progress Notes (Signed)
Assessment and Plan:  1. Hypertension -Continue medication, monitor blood pressure at home. Continue DASH diet.  Reminder to go to the ER if any CP, SOB, nausea, dizziness, severe HA, changes vision/speech, left arm numbness and tingling and jaw pain.  2. Cholesterol -Continue diet and exercise. Check cholesterol.   3. Prediabetes  -Continue diet and exercise. Check A1C  4. Vitamin D Def - check level and continue medications.   Continue diet and meds as discussed. Further disposition pending results of labs. Over 30 minutes of exam, counseling, chart review, and critical decision making was performed  Future Appointments Date Time Provider Department Center  06/28/2015 10:00 AM Quentin MullingAmanda Annamary Buschman, PA-C [facility] None     HPI 81 y.o. female  presents for 3 month follow up on hypertension, cholesterol, prediabetes, and vitamin D deficiency.   Her blood pressure has been controlled at home, today their BP is BP: 132/72 mmHg  She does workout. She denies chest pain, shortness of breath, dizziness.  She is not on cholesterol medication, her gemfibrozil was stopped last visit. Her cholesterol is at goal. The cholesterol last visit was:   Lab Results  Component Value Date   CHOL 154 12/05/2014   HDL 39* 12/05/2014   LDLCALC 83 12/05/2014   TRIG 158* 12/05/2014   CHOLHDL 3.9 12/05/2014   She has been working on diet and exercise for prediabetes, and denies paresthesia of the feet, polydipsia, polyuria and visual disturbances. Last A1C in the office was:  Lab Results  Component Value Date   HGBA1C 5.7* 12/05/2014  Patient is on Vitamin D supplement.   Lab Results  Component Value Date   VD25OH 27* 12/05/2014  She had a tooth abscess, has been on high powered antibiotic and had 4 teeth pulled, she had some weight loss she thinks due to this but she drinks ensure.  Wt Readings from Last 3 Encounters:  03/08/15 122 lb (55.339 kg)  02/08/15 124 lb (56.246 kg)  01/03/15 127 lb (57.607  kg)    She is on thyroid medication, she is on 1 pill T,T and half the rest of the week. Her medication was not changed last visit.   Lab Results  Component Value Date   TSH 4.493 12/05/2014  .    Current Medications:  Current Outpatient Prescriptions on File Prior to Visit  Medication Sig Dispense Refill  . ALPRAZolam (XANAX) 0.25 MG tablet Take 1 tablet (0.25 mg total) by mouth at bedtime as needed for anxiety. 60 tablet 0  . amitriptyline (ELAVIL) 50 MG tablet TAKE 1 TABLET BY MOUTH AT BEDTIME 90 tablet 1  . amLODipine (NORVASC) 2.5 MG tablet Take 1 tablet (2.5 mg total) by mouth daily. 90 tablet 1  . Cholecalciferol (VITAMIN D-3) 5000 UNITS TABS Take 5,000 Units by mouth daily.    . cyanocobalamin (,VITAMIN B-12,) 1000 MCG/ML injection INJECT 0.5 MLS (500 MCG TOTAL) INTO THE MUSCLE EVERY 30 (THIRTY) DAYS. 10 mL 3  . gemfibrozil (LOPID) 600 MG tablet Take 600 mg by mouth daily.    Marland Kitchen. levothyroxine (SYNTHROID, LEVOTHROID) 125 MCG tablet Take 1 tablet (125 mcg total) by mouth daily before breakfast. 1 pill times 5 days and 1 1/2 Tuesdays and Thursdays 90 tablet 2  . omeprazole (PRILOSEC) 40 MG capsule Take 1 capsule (40 mg total) by mouth daily. 90 capsule 3   No current facility-administered medications on file prior to visit.   Medical History:  Past Medical History  Diagnosis Date  . Hypertension   .  Hyperlipidemia   . Thyroid disease   . Anxiety   . Depression   . Anemia   . GERD (gastroesophageal reflux disease)   . Allergy   . DDD (degenerative disc disease), lumbar   . OA (osteoarthritis) of knee     right knee  . Vitamin D deficiency   . Pre-diabetes    Allergies:  Allergies  Allergen Reactions  . Amlodipine Swelling    Swelling/edema  . Norvasc [Amlodipine Besylate]   . Sulfa Antibiotics      Review of Systems:  Review of Systems  Constitutional: Positive for weight loss. Negative for fever, chills, malaise/fatigue and diaphoresis.  HENT: Negative.    Eyes: Negative.   Respiratory: Negative.  Negative for cough and shortness of breath.   Cardiovascular: Negative.  Negative for chest pain and palpitations.  Gastrointestinal: Negative.  Negative for abdominal pain and diarrhea.  Genitourinary: Negative.   Musculoskeletal: Negative.   Skin: Negative.   Neurological: Negative.  Negative for weakness.  Endo/Heme/Allergies: Negative.   Psychiatric/Behavioral: Negative.  Negative for depression.    Family history- Review and unchanged Social history- Review and unchanged Physical Exam: BP 132/72 mmHg  Pulse 80  Temp(Src) 97.9 F (36.6 C)  Resp 16  Ht 5\' 4"  (1.626 m)  Wt 122 lb (55.339 kg)  BMI 20.93 kg/m2 Wt Readings from Last 3 Encounters:  03/08/15 122 lb (55.339 kg)  02/08/15 124 lb (56.246 kg)  01/03/15 127 lb (57.607 kg)   General Appearance: Well nourished, in no apparent distress. Eyes: PERRLA, EOMs, conjunctiva no swelling or erythema Sinuses: No Frontal/maxillary tenderness ENT/Mouth: Ext aud canals clear, TMs without erythema, bulging. No erythema, swelling, or exudate on post pharynx.  Tonsils not swollen or erythematous. Hearing normal.  Neck: Supple, thyroid normal.  Respiratory: Respiratory effort normal, BS equal bilaterally without rales, rhonchi, wheezing or stridor.  Cardio: RRR with no MRGs. Brisk peripheral pulses without edema.  Abdomen: Soft, + BS,  Non tender, no guarding, rebound, hernias, masses. Lymphatics: Non tender without lymphadenopathy.  Musculoskeletal: Full ROM, 5/5 strength, Normal gait Skin: Warm, dry without rashes, lesions, ecchymosis.  Neuro: Cranial nerves intact. Normal muscle tone, no cerebellar symptoms. Psych: Awake and oriented X 3, normal affect, Insight and Judgment appropriate.    Quentin Mullingollier, Liviya Santini, PA-C 3:02 PM [facility]

## 2015-03-08 NOTE — Patient Instructions (Signed)
High Protein, High Calorie Diet A high protein, high calorie diet increases the amount of protein and calories you eat. You may need more protein and calories in your diet because of illness, surgery, injury, weight loss, or having a poor appetite. Eating high protein and high calorie foods can help you gain weight, heal, and recover after illness.  SERVING SIZES Measuring foods and serving sizes helps to make sure you are getting the right amount of food. The list below tells how big or small some common serving sizes are.   1 oz.........4 stacked dice.  3 oz........Marland Kitchen.Deck of cards.  1 tsp.......Marland Kitchen.Tip of little finger.  1 tbs......Marland Kitchen.Marland Kitchen.Thumb.  2 tbs.......Marland Kitchen.Golf ball.   cup......Marland Kitchen.Half of a fist.  1 cup.......Marland Kitchen.A fist. HIGH PROTEIN FOODS Dairy  Whole milk.  Whole milk yogurt.  Powdered milk.  Cheese.  [employer] CorporationCottage Cheese.  Instant breakfast products.  Eggnog. Tips for adding to your diet:  Use whole milk when making hot cereal, puddings, soups, and hot cocoa.  Add powdered milk to baked goods, smoothies, and milkshakes.  Make whole milk yogurt parfaits by adding granola, fruit, or nuts.  Add cheese to sandwiches, pastas, soups, and casseroles.  Add fruit to cottage cheese. Meat   Beef, pork, and poultry.  Fish and seafood.  Peanut butter.  Dried beans.  Eggs. Tips for adding to your diet:  Make meat and cheese omelets.  Add eggs to salads and baked goods.  Add fish and seafood to salads.  Add meat and poultry to casseroles, salads, and soups.  Use peanut butter as a topping for pretzels, celery, crackers, or add it to baked goods.  Use beans in casseroles, dips, and spreads. GENERAL GUIDELINES TO INCREASE CALORIES  Replace calorie-free drinks with calorie-containing drinks, such as milk, fruit juices, regular soda, milkshakes, and hot chocolate.  Try to eat 6 small meals instead of 3 large meals each day.  Keep snacks handy, such as nuts, trail mixes,  dried fruit, and yogurt.  Choose foods with sauces and gravies.  Add dried fruits, honey, and half-and-half to hot or cold cereal.  Add extra fats when possible, such as butter, sour cream, cream cheese, and salad dressings.  Add cheese to foods often.  Consider adding a clear liquid nutritional supplement to your diet. Your caregiver can give you recommendations. HIGH CALORIE FOODS Grain/Starch  Baked goods, such as muffins and quick breads.  Croissants.  Pancakes and waffles. Vegetable   Sauted vegetables in oil.  Fried vegetables.  Salad greens with regular salad dressing or vinegar and oil. Fruit  Dried fruit.  Canned fruit in syrup.  Fruit juice. Fat  Avocado.  Butter or margarine.  Whipped cream.  Mayonnaise.  Salad dressing.  Peanuts and mixed nuts.  Cream cheese and sour cream.    .  Protein and meal replacement bars.   Document Released: 10/20/2005 Document Revised: 01/12/2012 Document Reviewed: 07/23/2007 [facility] Patient Information 2015 [place], [place]. This information is not intended to replace advice given to you by your health care provider. Make sure you discuss any questions you have with your health care provider.

## 2015-04-04 ENCOUNTER — Other Ambulatory Visit: Payer: Self-pay | Admitting: Internal Medicine

## 2015-04-10 ENCOUNTER — Other Ambulatory Visit: Payer: Self-pay | Admitting: Internal Medicine

## 2015-04-12 ENCOUNTER — Ambulatory Visit (INDEPENDENT_AMBULATORY_CARE_PROVIDER_SITE_OTHER): Payer: Medicare Other | Admitting: *Deleted

## 2015-04-12 DIAGNOSIS — R899 Unspecified abnormal finding in specimens from other organs, systems and tissues: Secondary | ICD-10-CM | POA: Diagnosis not present

## 2015-04-12 DIAGNOSIS — E0590 Thyrotoxicosis, unspecified without thyrotoxic crisis or storm: Secondary | ICD-10-CM | POA: Diagnosis not present

## 2015-04-12 DIAGNOSIS — D510 Vitamin B12 deficiency anemia due to intrinsic factor deficiency: Secondary | ICD-10-CM | POA: Diagnosis not present

## 2015-04-12 LAB — BASIC METABOLIC PANEL WITH GFR
BUN: 17 mg/dL (ref 6–23)
CALCIUM: 9.1 mg/dL (ref 8.4–10.5)
CO2: 22 mEq/L (ref 19–32)
CREATININE: 0.92 mg/dL (ref 0.50–1.10)
Chloride: 104 mEq/L (ref 96–112)
GFR, Est African American: 66 mL/min
GFR, Est Non African American: 57 mL/min — ABNORMAL LOW
Glucose, Bld: 130 mg/dL — ABNORMAL HIGH (ref 70–99)
Potassium: 4.2 mEq/L (ref 3.5–5.3)
Sodium: 138 mEq/L (ref 135–145)

## 2015-04-12 LAB — TSH: TSH: 0.178 u[IU]/mL — ABNORMAL LOW (ref 0.350–4.500)

## 2015-04-12 MED ORDER — CYANOCOBALAMIN 1000 MCG/ML IJ SOLN
500.0000 ug | Freq: Once | INTRAMUSCULAR | Status: AC
  Administered 2015-04-12: 500 ug via INTRAMUSCULAR

## 2015-04-12 NOTE — Progress Notes (Signed)
Patient ID: Shelby Bradley, female   DOB: 12-Jan-1928, 81 y.o.   MRN: [id number] Patient here for Vitamin B12  Injection and TSH and BMET.  Injection given in left deltoid and tolerated well.  BP-138/56 and weight-120.2 lb.

## 2015-05-14 ENCOUNTER — Ambulatory Visit (INDEPENDENT_AMBULATORY_CARE_PROVIDER_SITE_OTHER): Payer: Medicare Other

## 2015-05-14 VITALS — BP 132/78 | HR 76 | Temp 98.1°F | Resp 16 | Ht 64.0 in | Wt 121.0 lb

## 2015-05-14 DIAGNOSIS — E0590 Thyrotoxicosis, unspecified without thyrotoxic crisis or storm: Secondary | ICD-10-CM | POA: Diagnosis not present

## 2015-05-14 DIAGNOSIS — D510 Vitamin B12 deficiency anemia due to intrinsic factor deficiency: Secondary | ICD-10-CM

## 2015-05-14 LAB — TSH: TSH: 1.447 u[IU]/mL (ref 0.350–4.500)

## 2015-05-14 MED ORDER — CYANOCOBALAMIN 1000 MCG/ML IJ SOLN
500.0000 ug | Freq: Once | INTRAMUSCULAR | Status: AC
  Administered 2015-05-14: 500 ug via INTRAMUSCULAR

## 2015-05-14 NOTE — Progress Notes (Signed)
Patient ID: Shelby BishopSara W Veazie, female   DOB: [birth year], 81 y.o.   MRN: [id number] Patient here today for B12 injection and TSH check. Patient received 0.5 mL IM right deltoid. Patient tolerated injection well. Patient verified that she is taking Levothyroxine 125 mcg, one whole tablet 2 days a week and a half tablet 5 days a week.

## 2015-05-31 DIAGNOSIS — H3581 Retinal edema: Secondary | ICD-10-CM | POA: Diagnosis not present

## 2015-05-31 DIAGNOSIS — H02403 Unspecified ptosis of bilateral eyelids: Secondary | ICD-10-CM | POA: Diagnosis not present

## 2015-05-31 DIAGNOSIS — H3531 Nonexudative age-related macular degeneration: Secondary | ICD-10-CM | POA: Diagnosis not present

## 2015-05-31 DIAGNOSIS — Z961 Presence of intraocular lens: Secondary | ICD-10-CM | POA: Diagnosis not present

## 2015-06-06 ENCOUNTER — Encounter: Payer: Self-pay | Admitting: Physician Assistant

## 2015-06-20 ENCOUNTER — Encounter (INDEPENDENT_AMBULATORY_CARE_PROVIDER_SITE_OTHER): Payer: Self-pay | Admitting: Ophthalmology

## 2015-06-21 ENCOUNTER — Encounter (INDEPENDENT_AMBULATORY_CARE_PROVIDER_SITE_OTHER): Payer: Self-pay | Admitting: Ophthalmology

## 2015-06-25 ENCOUNTER — Encounter (INDEPENDENT_AMBULATORY_CARE_PROVIDER_SITE_OTHER): Payer: Medicare Other | Admitting: Ophthalmology

## 2015-06-25 DIAGNOSIS — H3531 Nonexudative age-related macular degeneration: Secondary | ICD-10-CM | POA: Diagnosis not present

## 2015-06-25 DIAGNOSIS — H34831 Tributary (branch) retinal vein occlusion, right eye: Secondary | ICD-10-CM | POA: Diagnosis not present

## 2015-06-25 DIAGNOSIS — I10 Essential (primary) hypertension: Secondary | ICD-10-CM

## 2015-06-25 DIAGNOSIS — H35033 Hypertensive retinopathy, bilateral: Secondary | ICD-10-CM

## 2015-06-25 DIAGNOSIS — H43813 Vitreous degeneration, bilateral: Secondary | ICD-10-CM

## 2015-06-28 ENCOUNTER — Encounter: Payer: Self-pay | Admitting: Physician Assistant

## 2015-06-28 ENCOUNTER — Ambulatory Visit (INDEPENDENT_AMBULATORY_CARE_PROVIDER_SITE_OTHER): Payer: Medicare Other | Admitting: Physician Assistant

## 2015-06-28 VITALS — BP 158/60 | HR 88 | Temp 98.9°F | Resp 16 | Ht 64.0 in | Wt 122.6 lb

## 2015-06-28 DIAGNOSIS — Z0001 Encounter for general adult medical examination with abnormal findings: Secondary | ICD-10-CM

## 2015-06-28 DIAGNOSIS — K219 Gastro-esophageal reflux disease without esophagitis: Secondary | ICD-10-CM

## 2015-06-28 DIAGNOSIS — E559 Vitamin D deficiency, unspecified: Secondary | ICD-10-CM

## 2015-06-28 DIAGNOSIS — R7309 Other abnormal glucose: Secondary | ICD-10-CM

## 2015-06-28 DIAGNOSIS — I10 Essential (primary) hypertension: Secondary | ICD-10-CM | POA: Diagnosis not present

## 2015-06-28 DIAGNOSIS — F419 Anxiety disorder, unspecified: Secondary | ICD-10-CM | POA: Diagnosis not present

## 2015-06-28 DIAGNOSIS — R6889 Other general symptoms and signs: Secondary | ICD-10-CM

## 2015-06-28 DIAGNOSIS — E039 Hypothyroidism, unspecified: Secondary | ICD-10-CM

## 2015-06-28 DIAGNOSIS — D518 Other vitamin B12 deficiency anemias: Secondary | ICD-10-CM | POA: Diagnosis not present

## 2015-06-28 DIAGNOSIS — E785 Hyperlipidemia, unspecified: Secondary | ICD-10-CM

## 2015-06-28 DIAGNOSIS — Z789 Other specified health status: Secondary | ICD-10-CM | POA: Diagnosis not present

## 2015-06-28 DIAGNOSIS — D519 Vitamin B12 deficiency anemia, unspecified: Secondary | ICD-10-CM

## 2015-06-28 DIAGNOSIS — M545 Low back pain, unspecified: Secondary | ICD-10-CM

## 2015-06-28 DIAGNOSIS — Z0000 Encounter for general adult medical examination without abnormal findings: Secondary | ICD-10-CM

## 2015-06-28 DIAGNOSIS — H3530 Unspecified macular degeneration: Secondary | ICD-10-CM

## 2015-06-28 DIAGNOSIS — R7303 Prediabetes: Secondary | ICD-10-CM

## 2015-06-28 DIAGNOSIS — Z79899 Other long term (current) drug therapy: Secondary | ICD-10-CM

## 2015-06-28 DIAGNOSIS — Z6821 Body mass index (BMI) 21.0-21.9, adult: Secondary | ICD-10-CM

## 2015-06-28 DIAGNOSIS — Z9181 History of falling: Secondary | ICD-10-CM

## 2015-06-28 DIAGNOSIS — E538 Deficiency of other specified B group vitamins: Secondary | ICD-10-CM

## 2015-06-28 LAB — CBC WITH DIFFERENTIAL/PLATELET
Basophils Absolute: 0 10*3/uL (ref 0.0–0.1)
Basophils Relative: 0 % (ref 0–1)
EOS PCT: 1 % (ref 0–5)
Eosinophils Absolute: 0.1 10*3/uL (ref 0.0–0.7)
HCT: 32.7 % — ABNORMAL LOW (ref 36.0–46.0)
Hemoglobin: 10.9 g/dL — ABNORMAL LOW (ref 12.0–15.0)
Lymphocytes Relative: 25 % (ref 12–46)
Lymphs Abs: 1.6 10*3/uL (ref 0.7–4.0)
MCH: 29.5 pg (ref 26.0–34.0)
MCHC: 33.3 g/dL (ref 30.0–36.0)
MCV: 88.6 fL (ref 78.0–100.0)
MONO ABS: 0.5 10*3/uL (ref 0.1–1.0)
MPV: 8.7 fL (ref 8.6–12.4)
Monocytes Relative: 8 % (ref 3–12)
Neutro Abs: 4.2 10*3/uL (ref 1.7–7.7)
Neutrophils Relative %: 66 % (ref 43–77)
Platelets: 217 10*3/uL (ref 150–400)
RBC: 3.69 MIL/uL — AB (ref 3.87–5.11)
RDW: 14.2 % (ref 11.5–15.5)
WBC: 6.3 10*3/uL (ref 4.0–10.5)

## 2015-06-28 MED ORDER — MELOXICAM 7.5 MG PO TABS: ORAL_TABLET | ORAL | Status: DC

## 2015-06-28 MED ORDER — ALPRAZOLAM 0.25 MG PO TABS: 0.2500 mg | ORAL_TABLET | Freq: Every evening | ORAL | Status: DC | PRN

## 2015-06-28 MED ORDER — CYANOCOBALAMIN 1000 MCG/ML IJ SOLN: 1000.0000 ug | Freq: Once | INTRAMUSCULAR | Status: DC

## 2015-06-28 MED ORDER — CYANOCOBALAMIN 1000 MCG/ML IJ SOLN
500.0000 ug | INTRAMUSCULAR | Status: DC
  Administered 2015-06-28 – 2016-06-24 (×10): 500 ug via INTRAMUSCULAR

## 2015-06-28 NOTE — Patient Instructions (Signed)
You can take tylenol (575m) or tylenol arthritis (6535m with the meloxicam/antiinflammatories. The max you can take of tylenol a day is 300082maily, this is a max of 6 pills a day of the regular tyelnol (500m38mr a max of 4 a day of the tylenol arthritis (650mg32m long as no other medications you are taking contain tylenol.   Preventive Care for Adults A healthy lifestyle and preventive care can promote health and wellness. Preventive health guidelines for women include the following key practices.  A routine yearly physical is a good way to check with your health care provider about your health and preventive screening. It is a chance to share any concerns and updates on your health and to receive a thorough exam.  Visit your dentist for a routine exam and preventive care every 6 months. Brush your teeth twice a day and floss once a day. Good oral hygiene prevents tooth decay and gum disease.  The frequency of eye exams is based on your age, health, family medical history, use of contact lenses, and other factors. Follow your health care provider's recommendations for frequency of eye exams.  Eat a healthy diet. Foods like vegetables, fruits, whole grains, low-fat dairy products, and lean protein foods contain the nutrients you need without too many calories. Decrease your intake of foods high in solid fats, added sugars, and salt. Eat the right amount of calories for you.Get information about a proper diet from your health care provider, if necessary.  Regular physical exercise is one of the most important things you can do for your health. Most adults should get at least 150 minutes of moderate-intensity exercise (any activity that increases your heart rate and causes you to sweat) each week. In addition, most adults need muscle-strengthening exercises on 2 or more days a week.  Maintain a healthy weight. The body mass index (BMI) is a screening tool to identify possible weight problems. It  provides an estimate of body fat based on height and weight. Your health care provider can find your BMI and can help you achieve or maintain a healthy weight.For adults 20 years and older:  A BMI below 18.5 is considered underweight.  A BMI of 18.5 to 24.9 is normal.  A BMI of 25 to 29.9 is considered overweight.  A BMI of 30 and above is considered obese.  Maintain normal blood lipids and cholesterol levels by exercising and minimizing your intake of saturated fat. Eat a balanced diet with plenty of fruit and vegetables. If your lipid or cholesterol levels are high, you are over 50, or you are at high risk for heart disease, you may need your cholesterol levels checked more frequently.Ongoing high lipid and cholesterol levels should be treated with medicines if diet and exercise are not working.  If you smoke, find out from your health care provider how to quit. If you do not use tobacco, do not start.  Lung cancer screening is recommended for adults aged 55-8081-80s who are at high risk for developing lung cancer because of a history of smoking. A yearly low-dose CT scan of the lungs is recommended for people who have at least a 30-pack-year history of smoking and are a current smoker or have quit within the past 15 years. A pack year of smoking is smoking an average of 1 pack of cigarettes a day for 1 year (for example: 1 pack a day for 30 years or 2 packs a day for 15 years). Yearly screening should  continue until the smoker has stopped smoking for at least 15 years. Yearly screening should be stopped for people who develop a health problem that would prevent them from having lung cancer treatment.  Avoid use of street drugs. Do not share needles with anyone. Ask for help if you need support or instructions about stopping the use of drugs.  High blood pressure causes heart disease and increases the risk of stroke.  Ongoing high blood pressure should be treated with medicines if weight loss  and exercise do not work.  If you are 41-67 years old, ask your health care provider if you should take aspirin to prevent strokes.  Diabetes screening involves taking a blood sample to check your fasting blood sugar level. This should be done once every 3 years, after age 10, if you are within normal weight and without risk factors for diabetes. Testing should be considered at a younger age or be carried out more frequently if you are overweight and have at least 1 risk factor for diabetes.  Breast cancer screening is essential preventive care for women. You should practice "breast self-awareness." This means understanding the normal appearance and feel of your breasts and may include breast self-examination. Any changes detected, no matter how small, should be reported to a health care provider. Women in their 31s and 30s should have a clinical breast exam (CBE) by a health care provider as part of a regular health exam every 1 to 3 years. After age 54, women should have a CBE every year. Starting at age 71, women should consider having a mammogram (breast X-ray test) every year. Women who have a family history of breast cancer should talk to their health care provider about genetic screening. Women at a high risk of breast cancer should talk to their health care providers about having an MRI and a mammogram every year.  Breast cancer gene (BRCA)-related cancer risk assessment is recommended for women who have family members with BRCA-related cancers. BRCA-related cancers include breast, ovarian, tubal, and peritoneal cancers. Having family members with these cancers may be associated with an increased risk for harmful changes (mutations) in the breast cancer genes BRCA1 and BRCA2. Results of the assessment will determine the need for genetic counseling and BRCA1 and BRCA2 testing.  Routine pelvic exams to screen for cancer are no longer recommended for nonpregnant women who are considered low risk for  cancer of the pelvic organs (ovaries, uterus, and vagina) and who do not have symptoms. Ask your health care provider if a screening pelvic exam is right for you.  If you have had past treatment for cervical cancer or a condition that could lead to cancer, you need Pap tests and screening for cancer for at least 20 years after your treatment. If Pap tests have been discontinued, your risk factors (such as having a new sexual partner) need to be reassessed to determine if screening should be resumed. Some women have medical problems that increase the chance of getting cervical cancer. In these cases, your health care provider may recommend more frequent screening and Pap tests.    Colorectal cancer can be detected and often prevented. Most routine colorectal cancer screening begins at the age of 43 years and continues through age 17 years. However, your health care provider may recommend screening at an earlier age if you have risk factors for colon cancer. On a yearly basis, your health care provider may provide home test kits to check for hidden blood in the stool.  Use of a small camera at the end of a tube, to directly examine the colon (sigmoidoscopy or colonoscopy), can detect the earliest forms of colorectal cancer. Talk to your health care provider about this at age 76, when routine screening begins. Direct exam of the colon should be repeated every 5-10 years through age 33 years, unless early forms of pre-cancerous polyps or small growths are found.  Osteoporosis is a disease in which the bones lose minerals and strength with aging. This can result in serious bone fractures or breaks. The risk of osteoporosis can be identified using a bone density scan. Women ages 79 years and over and women at risk for fractures or osteoporosis should discuss screening with their health care providers. Ask your health care provider whether you should take a calcium supplement or vitamin D to reduce the rate of  osteoporosis.  Menopause can be associated with physical symptoms and risks. Hormone replacement therapy is available to decrease symptoms and risks. You should talk to your health care provider about whether hormone replacement therapy is right for you.  Use sunscreen. Apply sunscreen liberally and repeatedly throughout the day. You should seek shade when your shadow is shorter than you. Protect yourself by wearing long sleeves, pants, a wide-brimmed hat, and sunglasses year round, whenever you are outdoors.  Once a month, do a whole body skin exam, using a mirror to look at the skin on your back. Tell your health care provider of new moles, moles that have irregular borders, moles that are larger than a pencil eraser, or moles that have changed in shape or color.  Stay current with required vaccines (immunizations).  Influenza vaccine. All adults should be immunized every year.  Tetanus, diphtheria, and acellular pertussis (Td, Tdap) vaccine. Pregnant women should receive 1 dose of Tdap vaccine during each pregnancy. The dose should be obtained regardless of the length of time since the last dose. Immunization is preferred during the 27th-36th week of gestation. An adult who has not previously received Tdap or who does not know her vaccine status should receive 1 dose of Tdap. This initial dose should be followed by tetanus and diphtheria toxoids (Td) booster doses every 10 years. Adults with an unknown or incomplete history of completing a 3-dose immunization series with Td-containing vaccines should begin or complete a primary immunization series including a Tdap dose. Adults should receive a Td booster every 10 years.    Zoster vaccine. One dose is recommended for adults aged 5 years or older unless certain conditions are present.    Pneumococcal 13-valent conjugate (PCV13) vaccine. When indicated, a person who is uncertain of her immunization history and has no record of immunization  should receive the PCV13 vaccine. An adult aged 73 years or older who has certain medical conditions and has not been previously immunized should receive 1 dose of PCV13 vaccine. This PCV13 should be followed with a dose of pneumococcal polysaccharide (PPSV23) vaccine. The PPSV23 vaccine dose should be obtained at least 8 weeks after the dose of PCV13 vaccine. An adult aged 42 years or older who has certain medical conditions and previously received 1 or more doses of PPSV23 vaccine should receive 1 dose of PCV13. The PCV13 vaccine dose should be obtained 1 or more years after the last PPSV23 vaccine dose.    Pneumococcal polysaccharide (PPSV23) vaccine. When PCV13 is also indicated, PCV13 should be obtained first. All adults aged 68 years and older should be immunized. An adult younger than age 15 years  who has certain medical conditions should be immunized. Any person who resides in a nursing home or long-term care facility should be immunized. An adult smoker should be immunized. People with an immunocompromised condition and certain other conditions should receive both PCV13 and PPSV23 vaccines. People with human immunodeficiency virus (HIV) infection should be immunized as soon as possible after diagnosis. Immunization during chemotherapy or radiation therapy should be avoided. Routine use of PPSV23 vaccine is not recommended for American Indians, [place] Natives, or people younger than 65 years unless there are medical conditions that require PPSV23 vaccine. When indicated, people who have unknown immunization and have no record of immunization should receive PPSV23 vaccine. One-time revaccination 5 years after the first dose of PPSV23 is recommended for people aged 19-64 years who have chronic kidney failure, nephrotic syndrome, asplenia, or immunocompromised conditions. People who received 1-2 doses of PPSV23 before age 27 years should receive another dose of PPSV23 vaccine at age 45 years or later if at  least 5 years have passed since the previous dose. Doses of PPSV23 are not needed for people immunized with PPSV23 at or after age 77 years.   Preventive Services / Frequency  Ages 28 years and over  Blood pressure check.  Lipid and cholesterol check.  Lung cancer screening. / Every year if you are aged 29-80 years and have a 30-pack-year history of smoking and currently smoke or have quit within the past 15 years. Yearly screening is stopped once you have quit smoking for at least 15 years or develop a health problem that would prevent you from having lung cancer treatment.  Clinical breast exam.** / Every year after age 59 years.  BRCA-related cancer risk assessment.** / For women who have family members with a BRCA-related cancer (breast, ovarian, tubal, or peritoneal cancers).  Mammogram.** / Every year beginning at age 18 years and continuing for as long as you are in good health. Consult with your health care provider.  Pap test.** / Every 3 years starting at age 81 years through age 68 or 1 years with 3 consecutive normal Pap tests. Testing can be stopped between 65 and 70 years with 3 consecutive normal Pap tests and no abnormal Pap or HPV tests in the past 10 years.  Fecal occult blood test (FOBT) of stool. / Every year beginning at age 30 years and continuing until age 69 years. You may not need to do this test if you get a colonoscopy every 10 years.  Flexible sigmoidoscopy or colonoscopy.** / Every 5 years for a flexible sigmoidoscopy or every 10 years for a colonoscopy beginning at age 23 years and continuing until age 61 years.  Hepatitis C blood test.** / For all people born from [birth year] through 1963 and any individual with known risks for hepatitis C.  Osteoporosis screening.** / A one-time screening for women ages 50 years and over and women at risk for fractures or osteoporosis.  Skin self-exam. / Monthly.  Influenza vaccine. / Every year.  Tetanus, diphtheria, and  acellular pertussis (Tdap/Td) vaccine.** / 1 dose of Td every 10 years.  Zoster vaccine.** / 1 dose for adults aged 42 years or older.  Pneumococcal 13-valent conjugate (PCV13) vaccine.** / Consult your health care provider.  Pneumococcal polysaccharide (PPSV23) vaccine.** / 1 dose for all adults aged 44 years and older. Screening for abdominal aortic aneurysm (AAA)  by ultrasound is recommended for people who have history of high blood pressure or who are current or former smokers.

## 2015-06-28 NOTE — Progress Notes (Signed)
MEDICARE ANNUAL WELLNESS VISIT AND CPE  Assessment:   1. Essential hypertension - continue medications, DASH diet, exercise and monitor at home. Call if greater than 130/80.  - CBC with Differential/Platelet - BASIC METABOLIC PANEL WITH GFR - Hepatic function panel - Urinalysis, Routine w reflex microscopic (not at [facility]) - Microalbumin / creatinine urine ratio - EKG 12-Lead  2. Pre-diabetes Discussed general issues about diabetes pathophysiology and management., Educational material distributed., Suggested low cholesterol diet., Encouraged aerobic exercise., Discussed foot care., Reminded to get yearly retinal exam. - Hemoglobin A1c - Insulin, fasting  3. Hyperlipidemia -continue medications, check lipids, decrease fatty foods, increase activity.  - Lipid panel  4. Anemia due to vitamin B12 deficiency - monitor, continue iron supp with Vitamin C and increase green leafy veggies - Vitamin B12 - Iron and TIBC - Ferritin  5. Vitamin D deficiency - Vit D  25 hydroxy (rtn osteoporosis monitoring)  6. Medication management - Magnesium  7. Gastroesophageal reflux disease, esophagitis presence not specified Continue PPI/H2 blocker, diet discussed  8. Anxiety - ALPRAZolam (XANAX) 0.25 MG tablet; Take 1 tablet (0.25 mg total) by mouth at bedtime as needed for anxiety.  Dispense: 60 tablet; Refill: 1  9. Hypothyroidism, unspecified hypothyroidism type Hypothyroidism-check TSH level, continue medications the same, reminded to take on an empty stomach 30-45mins before food.  - TSH  10. Macular degeneration Continue follow up eye doctor  11. Midline low back pain without sciatica Can take tylenol, sparingly take the mobic, call if would like referral to PT - meloxicam (MOBIC) 7.5 MG tablet; Take one pill twice daily with food as need for pain, can take with tylenol, can not take with aleve, iburpofen  Dispense: 60 tablet; Refill: 3   Plan:   During the course of the visit the  patient was educated and counseled about appropriate screening and preventive services including:    Pneumococcal vaccine   Influenza vaccine  Td vaccine  Screening electrocardiogram  Screening mammography  Bone densitometry screening  Colorectal cancer screening  Diabetes screening  Glaucoma screening  Nutrition counseling   Advanced directives: given information/requested  Conditions/risks identified: BMI: Discussed weight loss, diet, and increase physical activity.  Increase physical activity: AHA recommends 150 minutes of physical activity a week.  Medications reviewed DEXA- declined, refuses treatment Diabetes at goal, ACE/ARB therapy Yes. Urinary Incontinence is not an issue: discussed non pharmacology and pharmacology options.  Fall risk: moderate- discussed PT, home fall assessment, medications.   Subjective:   Shelby Bradley is a 81 y.o. female who presents for Medicare Annual Wellness Visit and complete physical.    Date of last medicare wellness visit was 05/24/2014  She has had elevated blood pressure for 30+ years.  Her blood pressure has been controlled at home, today their BP is BP: (!) 158/60 mmHg She does workout, walks and mows yard with riding mower. She denies chest pain, shortness of breath, dizziness.  She is not on cholesterol medication, last visit she was taken off her lopid.  Her cholesterol is at goal. The cholesterol last visit was:   Lab Results  Component Value Date   CHOL 159 03/08/2015   HDL 37* 03/08/2015   LDLCALC 91 03/08/2015   TRIG 157* 03/08/2015   CHOLHDL 4.3 03/08/2015    She has been working on diet and exercise for prediabetes, and denies paresthesia of the feet, polydipsia, polyuria and visual disturbances. Last A1C in the office was:  Lab Results  Component Value Date   HGBA1C  6.1* 03/08/2015   Patient is on Vitamin D supplement.   She is on thyroid medication. Her medication was changed last visit, 1 pill 2 days a  week and half the rest of the week. Patient denies nervousness, palpitations and weight changes. She states she has had her hair falling out.  Lab Results  Component Value Date   TSH 1.447 05/14/2015  .  She has chronic hearing loss in left ear from mastoid sx when she was younger, wears bilateral hearing aids.  Follows up with Dr. Dione Booze regularly, and he sent her to Dr. Molli Hazard for mac degeneration, she got injections, she does still drive but does not drive at night.  She is on elavil for migraines/headaches that helps, takes 1/2 of elavil.  She will take xanax occ during the day for anxiety.   Medication Review Current Outpatient Prescriptions on File Prior to Visit  Medication Sig Dispense Refill  . ALPRAZolam (XANAX) 0.25 MG tablet Take 1 tablet (0.25 mg total) by mouth at bedtime as needed for anxiety. 60 tablet 0  . amitriptyline (ELAVIL) 50 MG tablet TAKE 1 TABLET BY MOUTH AT BEDTIME 90 tablet 1  . amLODipine (NORVASC) 2.5 MG tablet TAKE 1 TABLET (2.5 MG TOTAL) BY MOUTH DAILY. 90 tablet 1  . Cholecalciferol (VITAMIN D-3) 5000 UNITS TABS Take 5,000 Units by mouth daily.    . cyanocobalamin (,VITAMIN B-12,) 1000 MCG/ML injection INJECT 0.5 MLS (500 MCG TOTAL) INTO THE MUSCLE EVERY 30 (THIRTY) DAYS. 10 mL 3  . gemfibrozil (LOPID) 600 MG tablet Take 600 mg by mouth daily.    Marland Kitchen levothyroxine (SYNTHROID, LEVOTHROID) 125 MCG tablet Take 1 tablet (125 mcg total) by mouth daily before breakfast. 1 pill times 5 days and 1 1/2 Tuesdays and Thursdays 90 tablet 2  . omeprazole (PRILOSEC) 40 MG capsule Take 1 capsule (40 mg total) by mouth daily. 90 capsule 3   No current facility-administered medications on file prior to visit.    Current Problems (verified) Patient Active Problem List   Diagnosis Date Noted  . Medication management 12/05/2014  . Hypertension   . Hyperlipidemia   . Anxiety   . Anemia   . GERD (gastroesophageal reflux disease)   . Vitamin D deficiency   . Pre-diabetes      Screening Tests Health Maintenance  Topic Date Due  . TETANUS/TDAP  02/14/1947  . INFLUENZA VACCINE  06/04/2015  . COLONOSCOPY  11/03/2018  . DEXA SCAN  Completed  . ZOSTAVAX  Completed  . PNA vac Low Risk Adult  Completed    Immunization History  Administered Date(s) Administered  . DTaP 05/20/2012  . Influenza Whole 07/21/2013  . Influenza, High Dose Seasonal PF 06/29/2014  . Pneumococcal Conjugate-13 05/29/2014  . Pneumococcal Polysaccharide-23 05/07/2010, 12/05/2014  . Zoster 11/03/2006   Preventative care: Last colonoscopy: 2010 Last mammogram: 2009 Last pap smear/pelvic exam: 2007 DEXA: 2013 + osteoporsis  Prior vaccinations: TD or Tdap: 2013  Influenza: 2014  Pneumococcal: 2011 Prevnar 13: 2015 Shingles/Zostavax: 2008  Names of Other Physician/Practitioners you currently use: 1. [facility]- here for primary care 2. Dr. Dione Booze, eye doctor, once a year 3. Evelena Asa, dentist, once a year Patient Care Team: Lucky Cowboy, MD as PCP - General (Internal Medicine) Carman Ching, MD as Consulting Physician (Gastroenterology) Sanjuana Letters, MD as Referring Physician (Orthopedic Surgery) Sallye Lat, MD as Consulting Physician (Optometry)  Allergies Allergies  Allergen Reactions  . Amlodipine Swelling    Swelling/edema  . Norvasc [  Amlodipine Besylate]   . Sulfa Antibiotics    Surgical history Past Surgical History  Procedure Laterality Date  . Knee arthroscopy Left   . Cataract extraction      both   Family history Family History  Problem Relation Age of Onset  . Hypertension Mother   . COPD Father   . Heart attack Brother   . Heart attack Brother    Tobacco Social History  Substance Use Topics  . Smoking status: Never Smoker   . Smokeless tobacco: None  . Alcohol Use: No   MEDICARE WELLNESS OBJECTIVES: Tobacco use: She does not smoke.  Patient is not a former smoker. If yes, counseling  given Alcohol Current alcohol use: none Osteoporosis: postmenopausal estrogen deficiency and dietary calcium and/or vitamin D deficiency, History of fracture in the past year: no Fall risk: Low-moderate Risk Hearing: impaired Visual acuity: normal,  does perform annual eye exam Diet: in general, a "healthy" diet   Physical activity: Current Exercise Habits:: The patient has a physically strenous job, but has no regular exercise apart from work. Cardiac risk factors: Cardiac Risk Factors include: advanced age (>71men, >32 women);dyslipidemia;sedentary lifestyle;hypertension Depression/mood screen:   Depression screen [facility] 2/9 06/28/2015  Decreased Interest 0  Down, Depressed, Hopeless 0  PHQ - 2 Score 0    ADLs:  In your present state of health, do you have any difficulty performing the following activities: 06/28/2015  Hearing? Y  Vision? N  Difficulty concentrating or making decisions? N  Walking or climbing stairs? Y  Dressing or bathing? N  Doing errands, shopping? N  Preparing Food and eating ? N  Using the Toilet? N  In the past six months, have you accidently leaked urine? N  Do you have problems with loss of bowel control? N  Managing your Medications? N  Managing your Finances? N  Housekeeping or managing your Housekeeping? N    Cognitive Testing  Alert? Yes  Normal Appearance?Yes  Oriented to person? Yes  Place? Yes   Time? Yes  Recall of three objects?  Yes  Can perform simple calculations? Yes  Displays appropriate judgment?Yes  Can read the correct time from a watch face?Yes  EOL planning: Does patient have an advance directive?: Yes Type of Advance Directive: Healthcare Power of Attorney, Living will Does patient want to make changes to advanced directive?: No - Patient declined Copy of advanced directive(s) in chart?: No - copy requested  Review of Systems  Constitutional: Negative for fever, chills, weight loss, malaise/fatigue and diaphoresis.  HENT:  Negative.   Eyes: Negative.   Respiratory: Negative.  Negative for cough and shortness of breath.   Cardiovascular: Negative.  Negative for chest pain and palpitations.  Gastrointestinal: Negative.  Negative for abdominal pain and diarrhea.  Genitourinary: Negative.   Musculoskeletal: Positive for back pain (lower back pain, will take advil occ, worse with walking long distances while shopping, no radiation, no weakness. ). Negative for myalgias, joint pain, falls and neck pain.  Skin: Negative.   Neurological: Negative.  Negative for weakness.  Endo/Heme/Allergies: Negative.   Psychiatric/Behavioral: Negative.  Negative for depression.     Objective:     Blood pressure 158/60, pulse 88, temperature 98.9 F (37.2 C), resp. rate 16, height 5\' 4"  (1.626 m), weight 122 lb 9.6 oz (55.611 kg). Body mass index is 21.03 kg/(m^2).  General appearance: alert, no distress, WD/WN,  female HEENT: Left eye ptosis, normocephalic, sclerae anicteric, TM pearly on left, right TM white/scarred, nares patent, no discharge  or erythema, pharynx normal Oral cavity: MMM, no lesions Neck: supple, no lymphadenopathy, no thyromegaly, no masses Heart: RRR, normal S1, S2, no murmurs Lungs: CTA bilaterally, no wheezes, rhonchi, or rales Abdomen: +bs, soft, non tender, non distended, no masses, no hepatomegaly, no splenomegaly Musculoskeletal: nontender, no swelling, no obvious deformity, mild- moderate cervical kyphosis Extremities: no edema, no cyanosis, no clubbing Pulses: 2+ symmetric, upper and lower extremities, normal cap refill Neurological: alert, oriented x 3, CN2-12 intact, strength normal upper extremities and lower extremities, sensation normal throughout, DTRs 2+ throughout, no cerebellar signs, gait normal Psychiatric: normal affect, behavior normal, pleasant  Breast: nontender, no masses or lumps, no skin changes, no nipple discharge or inversion, no axillary lymphadenopathy Gyn: defer  Rectal:  defer  Medicare Attestation I have personally reviewed: The patient's medical and social history Their use of alcohol, tobacco or illicit drugs Their current medications and supplements The patient's functional ability including ADLs,fall risks, home safety risks, cognitive, and hearing and visual impairment Diet and physical activities Evidence for depression or mood disorders  The patient's weight, height, BMI, and visual acuity have been recorded in the chart.  I have made referrals, counseling, and provided education to the patient based on review of the above and I have provided the patient with a written personalized care plan for preventive services.     Quentin Mulling, PA-C   06/28/2015

## 2015-06-29 LAB — HEPATIC FUNCTION PANEL
ALBUMIN: 4 g/dL (ref 3.6–5.1)
ALT: 11 U/L (ref 6–29)
AST: 22 U/L (ref 10–35)
Alkaline Phosphatase: 76 U/L (ref 33–130)
BILIRUBIN INDIRECT: 0.3 mg/dL (ref 0.2–1.2)
BILIRUBIN TOTAL: 0.4 mg/dL (ref 0.2–1.2)
Bilirubin, Direct: 0.1 mg/dL (ref <=0.2)
TOTAL PROTEIN: 6.5 g/dL (ref 6.1–8.1)

## 2015-06-29 LAB — BASIC METABOLIC PANEL WITH GFR
BUN: 17 mg/dL (ref 7–25)
CALCIUM: 9.2 mg/dL (ref 8.6–10.4)
CO2: 25 mmol/L (ref 20–31)
Chloride: 106 mmol/L (ref 98–110)
Creat: 0.94 mg/dL — ABNORMAL HIGH (ref 0.60–0.88)
GFR, EST AFRICAN AMERICAN: 64 mL/min (ref >=60)
GFR, Est Non African American: 55 mL/min — ABNORMAL LOW (ref >=60)
Glucose, Bld: 83 mg/dL (ref 65–99)
Potassium: 4.3 mmol/L (ref 3.5–5.3)
Sodium: 139 mmol/L (ref 135–146)

## 2015-06-29 LAB — URINALYSIS, ROUTINE W REFLEX MICROSCOPIC
Bilirubin Urine: NEGATIVE
GLUCOSE, UA: NEGATIVE
Ketones, ur: NEGATIVE
LEUKOCYTES UA: NEGATIVE
Nitrite: NEGATIVE
PH: 5.5 (ref 5.0–8.0)
Protein, ur: NEGATIVE
SPECIFIC GRAVITY, URINE: 1.006 (ref 1.001–1.035)

## 2015-06-29 LAB — TSH: TSH: 1.108 u[IU]/mL (ref 0.350–4.500)

## 2015-06-29 LAB — MICROALBUMIN / CREATININE URINE RATIO
Creatinine, Urine: 39 mg/dL
MICROALB/CREAT RATIO: 15.4 mg/g (ref 0.0–30.0)
Microalb, Ur: 0.6 mg/dL (ref <2.0)

## 2015-06-29 LAB — IRON AND TIBC
%SAT: 21 % (ref 20–55)
IRON: 51 ug/dL (ref 42–145)
TIBC: 244 ug/dL — AB (ref 250–470)
UIBC: 193 ug/dL (ref 125–400)

## 2015-06-29 LAB — LIPID PANEL
CHOL/HDL RATIO: 3.8 ratio (ref <=5.0)
Cholesterol: 161 mg/dL (ref 125–200)
HDL: 42 mg/dL — AB (ref >=46)
LDL CALC: 91 mg/dL (ref <130)
Triglycerides: 139 mg/dL (ref <150)
VLDL: 28 mg/dL (ref <30)

## 2015-06-29 LAB — HEMOGLOBIN A1C
HEMOGLOBIN A1C: 5.8 % — AB (ref <5.7)
MEAN PLASMA GLUCOSE: 120 mg/dL — AB (ref <117)

## 2015-06-29 LAB — INSULIN, FASTING: Insulin fasting, serum: 6.4 u[IU]/mL (ref 2.0–19.6)

## 2015-06-29 LAB — URINALYSIS, MICROSCOPIC ONLY
BACTERIA UA: NONE SEEN [HPF]
CASTS: NONE SEEN [LPF]
Crystals: NONE SEEN [HPF]
RBC / HPF: NONE SEEN RBC/HPF (ref <=2)
SQUAMOUS EPITHELIAL / LPF: NONE SEEN [HPF] (ref <=5)
WBC, UA: NONE SEEN WBC/HPF (ref <=5)
YEAST: NONE SEEN [HPF]

## 2015-06-29 LAB — MAGNESIUM: Magnesium: 2 mg/dL (ref 1.5–2.5)

## 2015-06-29 LAB — VITAMIN B12: VITAMIN B 12: 457 pg/mL (ref 211–911)

## 2015-06-29 LAB — VITAMIN D 25 HYDROXY (VIT D DEFICIENCY, FRACTURES): Vit D, 25-Hydroxy: 26 ng/mL — ABNORMAL LOW (ref 30–100)

## 2015-06-29 LAB — FERRITIN: Ferritin: 52 ng/mL (ref 10–291)

## 2015-07-10 ENCOUNTER — Other Ambulatory Visit: Payer: Self-pay | Admitting: Internal Medicine

## 2015-07-23 ENCOUNTER — Encounter (INDEPENDENT_AMBULATORY_CARE_PROVIDER_SITE_OTHER): Payer: Medicare Other | Admitting: Ophthalmology

## 2015-07-23 DIAGNOSIS — H34831 Tributary (branch) retinal vein occlusion, right eye: Secondary | ICD-10-CM

## 2015-07-23 DIAGNOSIS — H43813 Vitreous degeneration, bilateral: Secondary | ICD-10-CM | POA: Diagnosis not present

## 2015-07-23 DIAGNOSIS — I10 Essential (primary) hypertension: Secondary | ICD-10-CM | POA: Diagnosis not present

## 2015-07-23 DIAGNOSIS — H35033 Hypertensive retinopathy, bilateral: Secondary | ICD-10-CM

## 2015-07-23 DIAGNOSIS — H3531 Nonexudative age-related macular degeneration: Secondary | ICD-10-CM | POA: Diagnosis not present

## 2015-07-30 ENCOUNTER — Ambulatory Visit (INDEPENDENT_AMBULATORY_CARE_PROVIDER_SITE_OTHER): Payer: Medicare Other | Admitting: *Deleted

## 2015-07-30 ENCOUNTER — Encounter: Payer: Self-pay | Admitting: *Deleted

## 2015-07-30 VITALS — BP 144/72 | HR 80 | Temp 98.0°F | Resp 16 | Ht 64.0 in | Wt 127.0 lb

## 2015-07-30 DIAGNOSIS — E0590 Thyrotoxicosis, unspecified without thyrotoxic crisis or storm: Secondary | ICD-10-CM

## 2015-07-30 DIAGNOSIS — Z23 Encounter for immunization: Secondary | ICD-10-CM

## 2015-07-30 LAB — TSH: TSH: 0.811 u[IU]/mL (ref 0.350–4.500)

## 2015-07-30 NOTE — Progress Notes (Signed)
Patient ID: Shelby Bradley, female   DOB: [birth year], 81 y.o.   MRN: [id number] Patient presents for B12 injection.  Patient received 0.5 ml IM right deltoid.  Patient tolerated well.  Patient also needs recheck of TSH today.  Patient states she currently is taking Levothyroxine 125 mcg Tues and Thursday and 1/2 pill times 5 days per week.

## 2015-08-27 ENCOUNTER — Encounter (INDEPENDENT_AMBULATORY_CARE_PROVIDER_SITE_OTHER): Payer: Medicare Other | Admitting: Ophthalmology

## 2015-08-27 DIAGNOSIS — H43813 Vitreous degeneration, bilateral: Secondary | ICD-10-CM | POA: Diagnosis not present

## 2015-08-27 DIAGNOSIS — H35033 Hypertensive retinopathy, bilateral: Secondary | ICD-10-CM | POA: Diagnosis not present

## 2015-08-27 DIAGNOSIS — H34831 Tributary (branch) retinal vein occlusion, right eye, with macular edema: Secondary | ICD-10-CM

## 2015-08-27 DIAGNOSIS — H353111 Nonexudative age-related macular degeneration, right eye, early dry stage: Secondary | ICD-10-CM

## 2015-08-27 DIAGNOSIS — I10 Essential (primary) hypertension: Secondary | ICD-10-CM

## 2015-08-27 DIAGNOSIS — H353124 Nonexudative age-related macular degeneration, left eye, advanced atrophic with subfoveal involvement: Secondary | ICD-10-CM

## 2015-08-29 ENCOUNTER — Ambulatory Visit (INDEPENDENT_AMBULATORY_CARE_PROVIDER_SITE_OTHER): Payer: Medicare Other | Admitting: *Deleted

## 2015-08-29 VITALS — BP 112/64 | HR 76 | Temp 97.5°F | Resp 16 | Wt 125.8 lb

## 2015-08-29 DIAGNOSIS — D510 Vitamin B12 deficiency anemia due to intrinsic factor deficiency: Secondary | ICD-10-CM

## 2015-10-01 ENCOUNTER — Encounter (INDEPENDENT_AMBULATORY_CARE_PROVIDER_SITE_OTHER): Payer: Medicare Other | Admitting: Ophthalmology

## 2015-10-01 DIAGNOSIS — H353111 Nonexudative age-related macular degeneration, right eye, early dry stage: Secondary | ICD-10-CM

## 2015-10-01 DIAGNOSIS — H34831 Tributary (branch) retinal vein occlusion, right eye, with macular edema: Secondary | ICD-10-CM | POA: Diagnosis not present

## 2015-10-01 DIAGNOSIS — H35033 Hypertensive retinopathy, bilateral: Secondary | ICD-10-CM | POA: Diagnosis not present

## 2015-10-01 DIAGNOSIS — H43813 Vitreous degeneration, bilateral: Secondary | ICD-10-CM | POA: Diagnosis not present

## 2015-10-01 DIAGNOSIS — I10 Essential (primary) hypertension: Secondary | ICD-10-CM

## 2015-10-01 DIAGNOSIS — H353124 Nonexudative age-related macular degeneration, left eye, advanced atrophic with subfoveal involvement: Secondary | ICD-10-CM

## 2015-10-02 ENCOUNTER — Encounter: Payer: Self-pay | Admitting: Physician Assistant

## 2015-10-02 ENCOUNTER — Ambulatory Visit (INDEPENDENT_AMBULATORY_CARE_PROVIDER_SITE_OTHER): Payer: Medicare Other | Admitting: Physician Assistant

## 2015-10-02 VITALS — BP 130/70 | HR 100 | Temp 97.3°F | Resp 14 | Ht 64.0 in | Wt 126.0 lb

## 2015-10-02 DIAGNOSIS — Z79899 Other long term (current) drug therapy: Secondary | ICD-10-CM

## 2015-10-02 DIAGNOSIS — E039 Hypothyroidism, unspecified: Secondary | ICD-10-CM | POA: Diagnosis not present

## 2015-10-02 DIAGNOSIS — E538 Deficiency of other specified B group vitamins: Secondary | ICD-10-CM | POA: Diagnosis not present

## 2015-10-02 DIAGNOSIS — D519 Vitamin B12 deficiency anemia, unspecified: Secondary | ICD-10-CM | POA: Diagnosis not present

## 2015-10-02 DIAGNOSIS — R7309 Other abnormal glucose: Secondary | ICD-10-CM | POA: Diagnosis not present

## 2015-10-02 DIAGNOSIS — E785 Hyperlipidemia, unspecified: Secondary | ICD-10-CM | POA: Diagnosis not present

## 2015-10-02 DIAGNOSIS — Z0000 Encounter for general adult medical examination without abnormal findings: Secondary | ICD-10-CM | POA: Insufficient documentation

## 2015-10-02 DIAGNOSIS — E559 Vitamin D deficiency, unspecified: Secondary | ICD-10-CM

## 2015-10-02 DIAGNOSIS — I10 Essential (primary) hypertension: Secondary | ICD-10-CM

## 2015-10-02 LAB — HEPATIC FUNCTION PANEL
ALK PHOS: 78 U/L (ref 33–130)
ALT: 11 U/L (ref 6–29)
AST: 19 U/L (ref 10–35)
Albumin: 3.4 g/dL — ABNORMAL LOW (ref 3.6–5.1)
BILIRUBIN DIRECT: 0.1 mg/dL (ref <=0.2)
BILIRUBIN INDIRECT: 0.2 mg/dL (ref 0.2–1.2)
Total Bilirubin: 0.3 mg/dL (ref 0.2–1.2)
Total Protein: 5.9 g/dL — ABNORMAL LOW (ref 6.1–8.1)

## 2015-10-02 LAB — CBC WITH DIFFERENTIAL/PLATELET
BASOS PCT: 0 % (ref 0–1)
Basophils Absolute: 0 10*3/uL (ref 0.0–0.1)
EOS ABS: 0.1 10*3/uL (ref 0.0–0.7)
EOS PCT: 2 % (ref 0–5)
HCT: 30.6 % — ABNORMAL LOW (ref 36.0–46.0)
HEMOGLOBIN: 10.2 g/dL — AB (ref 12.0–15.0)
Lymphocytes Relative: 29 % (ref 12–46)
Lymphs Abs: 1.3 10*3/uL (ref 0.7–4.0)
MCH: 29 pg (ref 26.0–34.0)
MCHC: 33.3 g/dL (ref 30.0–36.0)
MCV: 86.9 fL (ref 78.0–100.0)
MONO ABS: 0.4 10*3/uL (ref 0.1–1.0)
MONOS PCT: 8 % (ref 3–12)
MPV: 8.7 fL (ref 8.6–12.4)
Neutro Abs: 2.7 10*3/uL (ref 1.7–7.7)
Neutrophils Relative %: 61 % (ref 43–77)
Platelets: 206 10*3/uL (ref 150–400)
RBC: 3.52 MIL/uL — AB (ref 3.87–5.11)
RDW: 14.1 % (ref 11.5–15.5)
WBC: 4.5 10*3/uL (ref 4.0–10.5)

## 2015-10-02 LAB — LIPID PANEL
CHOL/HDL RATIO: 3.9 ratio (ref <=5.0)
Cholesterol: 147 mg/dL (ref 125–200)
HDL: 38 mg/dL — AB (ref >=46)
LDL Cholesterol: 83 mg/dL (ref <130)
Triglycerides: 128 mg/dL (ref <150)
VLDL: 26 mg/dL (ref <30)

## 2015-10-02 LAB — BASIC METABOLIC PANEL WITH GFR
BUN: 21 mg/dL (ref 7–25)
CALCIUM: 8.9 mg/dL (ref 8.6–10.4)
CO2: 23 mmol/L (ref 20–31)
CREATININE: 0.93 mg/dL — AB (ref 0.60–0.88)
Chloride: 105 mmol/L (ref 98–110)
GFR, EST AFRICAN AMERICAN: 65 mL/min (ref >=60)
GFR, EST NON AFRICAN AMERICAN: 56 mL/min — AB (ref >=60)
Glucose, Bld: 110 mg/dL — ABNORMAL HIGH (ref 65–99)
Potassium: 4.7 mmol/L (ref 3.5–5.3)
SODIUM: 136 mmol/L (ref 135–146)

## 2015-10-02 LAB — TSH: TSH: 6.156 u[IU]/mL — AB (ref 0.350–4.500)

## 2015-10-02 LAB — HEMOGLOBIN A1C
HEMOGLOBIN A1C: 5.7 % — AB (ref <5.7)
MEAN PLASMA GLUCOSE: 117 mg/dL — AB (ref <117)

## 2015-10-02 LAB — MAGNESIUM: Magnesium: 1.8 mg/dL (ref 1.5–2.5)

## 2015-10-02 MED ORDER — AMLODIPINE BESYLATE 2.5 MG PO TABS: ORAL_TABLET | ORAL | Status: DC

## 2015-10-02 NOTE — Progress Notes (Signed)
Assessment and Plan:  1. Hypertension -Continue medication, monitor blood pressure at home. Continue DASH diet.  Reminder to go to the ER if any CP, SOB, nausea, dizziness, severe HA, changes vision/speech, left arm numbness and tingling and jaw pain.  2. Cholesterol -Continue diet and exercise. Check cholesterol.   3. Prediabetes  -Continue diet and exercise. Check A1C  4. Vitamin D Def - check level and continue medications.   5. Hypothyroidism -check TSH level, continue medications the same, reminded to take on an empty stomach 30-4960mins before food.    Continue diet and meds as discussed. Further disposition pending results of labs. Over 30 minutes of exam, counseling, chart review, and critical decision making was performed  Future Appointments Date Time Provider Department Center  11/19/2015 12:45 PM Sherrie GeorgeJohn D Matthews, MD [facility] None  12/31/2015 10:30 AM Quentin MullingAmanda Eleshia Wooley, PA-C [facility] None  06/30/2016 10:00 AM Quentin MullingAmanda Tiandre Teall, PA-C [facility] None     HPI 81 y.o. female  presents for 3 month follow up on hypertension, cholesterol, prediabetes, and vitamin D deficiency.   Her blood pressure has been controlled at home, today their BP is BP: 130/70 mmHg  She does workout. She denies chest pain, shortness of breath, dizziness.  She is not on cholesterol medication. Her cholesterol is at goal. The cholesterol last visit was:   Lab Results  Component Value Date   CHOL 161 06/28/2015   HDL 42* 06/28/2015   LDLCALC 91 06/28/2015   TRIG 139 06/28/2015   CHOLHDL 3.8 06/28/2015   She has been working on diet and exercise for prediabetes, and denies paresthesia of the feet, polydipsia, polyuria and visual disturbances. Last A1C in the office was:  Lab Results  Component Value Date   HGBA1C 5.8* 06/28/2015  Patient is on Vitamin D supplement.   Lab Results  Component Value Date   VD25OH 2826* 06/28/2015   She had her 5th shot for mac degen in her right eye with Dr. Molli HazardMatthew and  states that it is helping, seeing once a month. She had not had anymore back pain, only takes very rarely  She is on thyroid medication, she is on 1/2 of a pill daily.  Her medication was changed last visit.   Lab Results  Component Value Date   TSH 0.811 07/30/2015  .    Current Medications:  Current Outpatient Prescriptions on File Prior to Visit  Medication Sig Dispense Refill  . ALPRAZolam (XANAX) 0.25 MG tablet Take 1 tablet (0.25 mg total) by mouth at bedtime as needed for anxiety. 60 tablet 1  . amitriptyline (ELAVIL) 50 MG tablet TAKE 1 TABLET BY MOUTH AT BEDTIME 90 tablet 1  . amLODipine (NORVASC) 2.5 MG tablet TAKE 1 TABLET (2.5 MG TOTAL) BY MOUTH DAILY. 90 tablet 1  . Cholecalciferol (VITAMIN D-3) 5000 UNITS TABS Take 5,000 Units by mouth daily.    . cyanocobalamin (,VITAMIN B-12,) 1000 MCG/ML injection INJECT 0.5 MLS (500 MCG TOTAL) INTO THE MUSCLE EVERY 30 (THIRTY) DAYS. 10 mL 3  . levothyroxine (SYNTHROID, LEVOTHROID) 125 MCG tablet TAKE 1 TABLET BY MOUTH BEFORE BREAKFAST FOR 5 DAYS THEN 1 AND 1/2 TABLETS ON TUES. THURS. 90 tablet 2  . meloxicam (MOBIC) 7.5 MG tablet Take one pill twice daily with food as need for pain, can take with tylenol, can not take with aleve, iburpofen 60 tablet 3  . omeprazole (PRILOSEC) 40 MG capsule Take 1 capsule (40 mg total) by mouth daily. 90 capsule 3   Current Facility-Administered Medications on  File Prior to Visit  Medication Dose Route Frequency Provider Last Rate Last Dose  . cyanocobalamin ((VITAMIN B-12)) injection 500 mcg  500 mcg Intramuscular Q30 days Quentin Mulling, PA-C   500 mcg at 08/29/15 1246   Medical History:  Past Medical History  Diagnosis Date  . Hypertension   . Hyperlipidemia   . Thyroid disease   . Anxiety   . Depression   . Anemia   . GERD (gastroesophageal reflux disease)   . Allergy   . DDD (degenerative disc disease), lumbar   . OA (osteoarthritis) of knee     right knee  . Vitamin D deficiency   .  Pre-diabetes    Allergies:  Allergies  Allergen Reactions  . Amlodipine Swelling    Swelling/edema  . Norvasc [Amlodipine Besylate]   . Sulfa Antibiotics      Review of Systems:  Review of Systems  Constitutional: Positive for weight loss. Negative for fever, chills, malaise/fatigue and diaphoresis.  HENT: Negative.   Eyes: Negative.   Respiratory: Negative.  Negative for cough and shortness of breath.   Cardiovascular: Negative.  Negative for chest pain and palpitations.  Gastrointestinal: Negative.  Negative for abdominal pain and diarrhea.  Genitourinary: Negative.   Musculoskeletal: Negative.   Skin: Negative.   Neurological: Negative.  Negative for weakness.  Endo/Heme/Allergies: Negative.   Psychiatric/Behavioral: Negative.  Negative for depression.    Family history- Review and unchanged Social history- Review and unchanged Physical Exam: BP 130/70 mmHg  Pulse 100  Temp(Src) 97.3 F (36.3 C) (Temporal)  Resp 14  Ht  (1.626 m)  Wt 126 lb (57.153 kg)  BMI 21.62 kg/m2  SpO2 95% Wt Readings from Last 3 Encounters:  10/02/15 126 lb (57.153 kg)  08/29/15 125 lb 12.8 oz (57.063 kg)  07/30/15 127 lb (57.607 kg)   General appearance: alert, no distress, WD/WN,  female HEENT: Left eye ptosis, normocephalic, sclerae anicteric, TM pearly on left, right TM white/scarred, nares patent, no discharge or erythema, pharynx normal Oral cavity: MMM, no lesions Neck: supple, no lymphadenopathy, no thyromegaly, no masses Heart: RRR, normal S1, S2, no murmurs Lungs: CTA bilaterally, no wheezes, rhonchi, or rales Abdomen: +bs, soft, non tender, non distended, no masses, no hepatomegaly, no splenomegaly Musculoskeletal: nontender, no swelling, no obvious deformity, mild- moderate cervical kyphosis Extremities: no edema, no cyanosis, no clubbing Pulses: 2+ symmetric, upper and lower extremities, normal cap refill Neurological: alert, oriented x 3, CN2-12 intact, strength  normal upper extremities and lower extremities for age, sensation normal throughout, DTRs 2+ throughout, no cerebellar signs, gait normal Psychiatric: normal affect, behavior normal, pleasant    Quentin Mulling, PA-C 9:51 AM [facility]

## 2015-10-02 NOTE — Patient Instructions (Signed)
Monitor your blood pressure at home. Go to the ER if any CP, SOB, nausea, dizziness, severe HA, changes vision/speech  Goal BP:  For patients younger than 60: Goal BP < 140/90. For patients 60 and older: Goal BP < 150/90. For patients with diabetes: Goal BP < 140/90. Your most recent BP: BP: 130/70 mmHg   Take your medications faithfully as instructed. Maintain a healthy weight. Get at least 150 minutes of aerobic exercise per week. Minimize salt intake. Minimize alcohol intake  DASH Eating Plan DASH stands for "Dietary Approaches to Stop Hypertension." The DASH eating plan is a healthy eating plan that has been shown to reduce high blood pressure (hypertension). Additional health benefits may include reducing the risk of type 2 diabetes mellitus, heart disease, and stroke. The DASH eating plan may also help with weight loss. WHAT DO I NEED TO KNOW ABOUT THE DASH EATING PLAN? For the DASH eating plan, you will follow these general guidelines:  Choose foods with a percent daily value for sodium of less than 5% (as listed on the food label).  Use salt-free seasonings or herbs instead of table salt or sea salt.  Check with your health care provider or pharmacist before using salt substitutes.  Eat lower-sodium products, often labeled as "lower sodium" or "no salt added."  Eat fresh foods.  Eat more vegetables, fruits, and low-fat dairy products.  Choose whole grains. Look for the word "whole" as the first word in the ingredient list.  Choose fish and skinless chicken or [place] more often than red meat. Limit fish, poultry, and meat to 6 oz (170 g) each day.  Limit sweets, desserts, sugars, and sugary drinks.  Choose heart-healthy fats.  Limit cheese to 1 oz (28 g) per day.  Eat more home-cooked food and less restaurant, buffet, and fast food.  Limit fried foods.  Cook foods using methods other than frying.  Limit canned vegetables. If you do use them, rinse them well to  decrease the sodium.  When eating at a restaurant, ask that your food be prepared with less salt, or no salt if possible. WHAT FOODS CAN I EAT? Seek help from a dietitian for individual calorie needs. Grains Whole grain or whole wheat bread. Brown rice. Whole grain or whole wheat pasta. Quinoa, bulgur, and whole grain cereals. Low-sodium cereals. Corn or whole wheat flour tortillas. Whole grain cornbread. Whole grain crackers. Low-sodium crackers. Vegetables Fresh or frozen vegetables (raw, steamed, roasted, or grilled). Low-sodium or reduced-sodium tomato and vegetable juices. Low-sodium or reduced-sodium tomato sauce and paste. Low-sodium or reduced-sodium canned vegetables.  Fruits All fresh, canned (in natural juice), or frozen fruits. Meat and Other Protein Products Ground beef (85% or leaner), grass-fed beef, or beef trimmed of fat. Skinless chicken or [place]. Ground chicken or [place]. Pork trimmed of fat. All fish and seafood. Eggs. Dried beans, peas, or lentils. Unsalted nuts and seeds. Unsalted canned beans. Dairy Low-fat dairy products, such as skim or 1% milk, 2% or reduced-fat cheeses, low-fat ricotta or cottage cheese, or plain low-fat yogurt. Low-sodium or reduced-sodium cheeses. Fats and Oils Tub margarines without trans fats. Light or reduced-fat mayonnaise and salad dressings (reduced sodium). Avocado. Safflower, olive, or canola oils. Natural peanut or almond butter. Other Unsalted popcorn and pretzels. The items listed above may not be a complete list of recommended foods or beverages. Contact your dietitian for more options. WHAT FOODS ARE NOT RECOMMENDED? Grains White bread. White pasta. White rice. Refined cornbread. Bagels and croissants. Crackers that contain  trans fat. Vegetables Creamed or fried vegetables. Vegetables in a cheese sauce. Regular canned vegetables. Regular canned tomato sauce and paste. Regular tomato and vegetable juices. Fruits Dried fruits. Canned  fruit in light or heavy syrup. Fruit juice. Meat and Other Protein Products Fatty cuts of meat. Ribs, chicken wings, bacon, sausage, bologna, salami, chitterlings, fatback, hot dogs, bratwurst, and packaged luncheon meats. Salted nuts and seeds. Canned beans with salt. Dairy Whole or 2% milk, cream, half-and-half, and cream cheese. Whole-fat or sweetened yogurt. Full-fat cheeses or blue cheese. Nondairy creamers and whipped toppings. Processed cheese, cheese spreads, or cheese curds. Condiments Onion and garlic salt, seasoned salt, table salt, and sea salt. Canned and packaged gravies. Worcestershire sauce. Tartar sauce. Barbecue sauce. Teriyaki sauce. Soy sauce, including reduced sodium. Steak sauce. Fish sauce. Oyster sauce. Cocktail sauce. Horseradish. Ketchup and mustard. Meat flavorings and tenderizers. Bouillon cubes. Hot sauce. Tabasco sauce. Marinades. Taco seasonings. Relishes. Fats and Oils Butter, stick margarine, lard, shortening, ghee, and bacon fat. Coconut, palm kernel, or palm oils. Regular salad dressings. Other Pickles and olives. Salted popcorn and pretzels. The items listed above may not be a complete list of foods and beverages to avoid. Contact your dietitian for more information. WHERE CAN I FIND MORE INFORMATION? National Heart, Lung, and [employer] Institute: www.nhlbi.nih.gov/health/health-topics/topics/dash/ Document Released: 10/09/2011 Document Revised: 03/06/2014 Document Reviewed: 08/24/2013 Exit[employer] Patient Information 2015 [employer]. This information is not intended to replace advice given to you by your health care provider. Make sure you discuss any questions you have with your health care provider.  

## 2015-10-23 ENCOUNTER — Other Ambulatory Visit: Payer: Self-pay | Admitting: Physician Assistant

## 2015-10-23 NOTE — Telephone Encounter (Signed)
Rx called into [facility] pharmacy. 

## 2015-11-01 ENCOUNTER — Ambulatory Visit (INDEPENDENT_AMBULATORY_CARE_PROVIDER_SITE_OTHER): Payer: Medicare Other

## 2015-11-01 DIAGNOSIS — E538 Deficiency of other specified B group vitamins: Secondary | ICD-10-CM | POA: Diagnosis not present

## 2015-11-01 DIAGNOSIS — E039 Hypothyroidism, unspecified: Secondary | ICD-10-CM

## 2015-11-01 LAB — TSH: TSH: 3.443 u[IU]/mL (ref 0.350–4.500)

## 2015-11-01 NOTE — Progress Notes (Signed)
Pt presents for B-12 injection & recheck TSH. Injection given right arm & was tolerated well. Pt was given 0.55mL of B12 IM & will need to return in 30 days for next injection.

## 2015-11-19 ENCOUNTER — Encounter (INDEPENDENT_AMBULATORY_CARE_PROVIDER_SITE_OTHER): Payer: Medicare Other | Admitting: Ophthalmology

## 2015-11-19 DIAGNOSIS — H353111 Nonexudative age-related macular degeneration, right eye, early dry stage: Secondary | ICD-10-CM | POA: Diagnosis not present

## 2015-11-19 DIAGNOSIS — H34831 Tributary (branch) retinal vein occlusion, right eye, with macular edema: Secondary | ICD-10-CM | POA: Diagnosis not present

## 2015-11-19 DIAGNOSIS — H43813 Vitreous degeneration, bilateral: Secondary | ICD-10-CM | POA: Diagnosis not present

## 2015-11-19 DIAGNOSIS — I10 Essential (primary) hypertension: Secondary | ICD-10-CM

## 2015-11-19 DIAGNOSIS — H35033 Hypertensive retinopathy, bilateral: Secondary | ICD-10-CM

## 2015-11-19 DIAGNOSIS — H353124 Nonexudative age-related macular degeneration, left eye, advanced atrophic with subfoveal involvement: Secondary | ICD-10-CM

## 2015-11-29 ENCOUNTER — Other Ambulatory Visit: Payer: Self-pay | Admitting: Internal Medicine

## 2015-12-03 ENCOUNTER — Ambulatory Visit (INDEPENDENT_AMBULATORY_CARE_PROVIDER_SITE_OTHER): Payer: Medicare Other

## 2015-12-03 VITALS — BP 126/80 | Temp 98.1°F | Ht 64.0 in | Wt 127.4 lb

## 2015-12-03 DIAGNOSIS — E538 Deficiency of other specified B group vitamins: Secondary | ICD-10-CM | POA: Diagnosis not present

## 2015-12-03 MED ORDER — CYANOCOBALAMIN 1000 MCG/ML IJ SOLN: INTRAMUSCULAR | Status: DC

## 2015-12-31 ENCOUNTER — Ambulatory Visit: Payer: Self-pay | Admitting: Physician Assistant

## 2016-01-02 ENCOUNTER — Ambulatory Visit (INDEPENDENT_AMBULATORY_CARE_PROVIDER_SITE_OTHER): Payer: Medicare Other | Admitting: Internal Medicine

## 2016-01-02 ENCOUNTER — Encounter: Payer: Self-pay | Admitting: Internal Medicine

## 2016-01-02 VITALS — BP 138/64 | HR 76 | Temp 97.3°F | Resp 16 | Ht 64.0 in | Wt 121.8 lb

## 2016-01-02 DIAGNOSIS — R7309 Other abnormal glucose: Secondary | ICD-10-CM

## 2016-01-02 DIAGNOSIS — K219 Gastro-esophageal reflux disease without esophagitis: Secondary | ICD-10-CM | POA: Diagnosis not present

## 2016-01-02 DIAGNOSIS — E039 Hypothyroidism, unspecified: Secondary | ICD-10-CM | POA: Diagnosis not present

## 2016-01-02 DIAGNOSIS — Z79899 Other long term (current) drug therapy: Secondary | ICD-10-CM

## 2016-01-02 DIAGNOSIS — E559 Vitamin D deficiency, unspecified: Secondary | ICD-10-CM | POA: Diagnosis not present

## 2016-01-02 DIAGNOSIS — I10 Essential (primary) hypertension: Secondary | ICD-10-CM | POA: Diagnosis not present

## 2016-01-02 DIAGNOSIS — E785 Hyperlipidemia, unspecified: Secondary | ICD-10-CM | POA: Diagnosis not present

## 2016-01-02 DIAGNOSIS — E538 Deficiency of other specified B group vitamins: Secondary | ICD-10-CM

## 2016-01-02 LAB — LIPID PANEL
CHOL/HDL RATIO: 4.2 ratio (ref <=5.0)
CHOLESTEROL: 159 mg/dL (ref 125–200)
HDL: 38 mg/dL — AB (ref >=46)
LDL Cholesterol: 83 mg/dL (ref <130)
Triglycerides: 191 mg/dL — ABNORMAL HIGH (ref <150)
VLDL: 38 mg/dL — ABNORMAL HIGH (ref <30)

## 2016-01-02 LAB — CBC WITH DIFFERENTIAL/PLATELET
BASOS PCT: 0 % (ref 0–1)
Basophils Absolute: 0 10*3/uL (ref 0.0–0.1)
EOS ABS: 0.1 10*3/uL (ref 0.0–0.7)
EOS PCT: 2 % (ref 0–5)
HCT: 32.9 % — ABNORMAL LOW (ref 36.0–46.0)
Hemoglobin: 11.1 g/dL — ABNORMAL LOW (ref 12.0–15.0)
LYMPHS ABS: 1.9 10*3/uL (ref 0.7–4.0)
Lymphocytes Relative: 32 % (ref 12–46)
MCH: 30.2 pg (ref 26.0–34.0)
MCHC: 33.7 g/dL (ref 30.0–36.0)
MCV: 89.4 fL (ref 78.0–100.0)
MONO ABS: 0.4 10*3/uL (ref 0.1–1.0)
MONOS PCT: 7 % (ref 3–12)
MPV: 8.6 fL (ref 8.6–12.4)
Neutro Abs: 3.5 10*3/uL (ref 1.7–7.7)
Neutrophils Relative %: 59 % (ref 43–77)
PLATELETS: 229 10*3/uL (ref 150–400)
RBC: 3.68 MIL/uL — ABNORMAL LOW (ref 3.87–5.11)
RDW: 14.1 % (ref 11.5–15.5)
WBC: 5.9 10*3/uL (ref 4.0–10.5)

## 2016-01-02 LAB — HEPATIC FUNCTION PANEL
ALBUMIN: 3.9 g/dL (ref 3.6–5.1)
ALT: 10 U/L (ref 6–29)
AST: 21 U/L (ref 10–35)
Alkaline Phosphatase: 74 U/L (ref 33–130)
BILIRUBIN DIRECT: 0.1 mg/dL (ref <=0.2)
BILIRUBIN TOTAL: 0.4 mg/dL (ref 0.2–1.2)
Indirect Bilirubin: 0.3 mg/dL (ref 0.2–1.2)
Total Protein: 6.7 g/dL (ref 6.1–8.1)

## 2016-01-02 LAB — BASIC METABOLIC PANEL WITH GFR
BUN: 18 mg/dL (ref 7–25)
CALCIUM: 8.9 mg/dL (ref 8.6–10.4)
CO2: 24 mmol/L (ref 20–31)
CREATININE: 0.91 mg/dL — AB (ref 0.60–0.88)
Chloride: 106 mmol/L (ref 98–110)
GFR, Est African American: 67 mL/min (ref >=60)
GFR, Est Non African American: 58 mL/min — ABNORMAL LOW (ref >=60)
Glucose, Bld: 109 mg/dL — ABNORMAL HIGH (ref 65–99)
Potassium: 4.2 mmol/L (ref 3.5–5.3)
SODIUM: 138 mmol/L (ref 135–146)

## 2016-01-02 LAB — HEMOGLOBIN A1C
HEMOGLOBIN A1C: 5.7 % — AB (ref <5.7)
Mean Plasma Glucose: 117 mg/dL — ABNORMAL HIGH (ref <117)

## 2016-01-02 LAB — MAGNESIUM: MAGNESIUM: 2.1 mg/dL (ref 1.5–2.5)

## 2016-01-02 MED ORDER — CYANOCOBALAMIN 1000 MCG/ML IJ SOLN
500.0000 ug | Freq: Once | INTRAMUSCULAR | Status: AC
  Administered 2016-01-02: 500 ug via INTRAMUSCULAR

## 2016-01-02 NOTE — Patient Instructions (Signed)

## 2016-01-02 NOTE — Progress Notes (Signed)
Patient ID: Shelby Bradley, female   DOB: [birth year], 82 y.o.   MRN: [id number]   This very nice 82 y.o. DWF presents for  follow up with Hypertension, Hyperlipidemia, Pre-Diabetes, Hypothyroidism  and Vitamin D Deficiency. Patient also has GERD which is controlled with diet & meds. Patient also relates that she has had 8 shots to the [facility] for Macular Degeneration by Dr Luciana Axe.      Patient is treated for HTN circa 1987 & BP has been controlled at home. Today's BP: 138/64 mmHg. Patient has had no complaints of any cardiac type chest pain, palpitations, dyspnea/orthopnea/PND, dizziness, claudication, or dependent edema.     Hyperlipidemia is controlled with diet & meds. Patient denies myalgias or other med SE's. Last Lipids were at goal with Cholesterol 147; HDL 38*; LDL 83; Triglycerides 128 on 10/02/2015.     Also, the patient has history of PreDiabetes and has had no symptoms of reactive hypoglycemia, diabetic polys, paresthesias or visual blurring.  Last A1c was  5.7% on 10/02/2015.      Patient has hx/o B12 deficiency and has been on injection replacement for years. Further, the patient also has history of Vitamin D Deficiency and supplements vitamin D without any suspected side-effects. Last vitamin D was still very low at 26 on 06/28/2015.     Medication Sig  . ALPRAZolam  0.25 MG tablet TAKE 1 TAB EVERY DAY AT BEDTIME   . amitriptyline 50 MG tablet TAKE 1 TAB AT BEDTIME  . amLODipine  2.5 MG tablet TAKE 1 TAB DAILY.  Marland Kitchen VITAMIN D 5000 UNITS Take 5,000 Units by mouth daily.  Marland Kitchen VITAMIN B-12 1000 MCG/ML in INJECT 0.5 MLS  EVERY 30  DAYS.  Marland Kitchen levothyroxine  125 MCG tablet TAKE 1 TAB BEFORE BREAKFAST  . meloxicam  7.5 MG tablet Take one pill twice daily with food as need for pain  . Omeprazole 40 MG capsule Take 1 cap daily.   Allergies  Allergen Reactions  . Amlodipine Swelling    Swelling/edema  . Norvasc [Amlodipine Besylate]   . Sulfa Antibiotics    PMHx:   Past Medical History   Diagnosis Date  . Hypertension   . Hyperlipidemia   . Thyroid disease   . Anxiety   . Depression   . Anemia   . GERD (gastroesophageal reflux disease)   . Allergy   . DDD (degenerative disc disease), lumbar   . OA (osteoarthritis) of knee     right knee  . Vitamin D deficiency   . Pre-diabetes    Immunization History  Administered Date(s) Administered  . DTaP 05/20/2012  . Influenza Whole 07/21/2013  . Influenza, High Dose Seasonal PF 06/29/2014, 07/30/2015  . Pneumococcal Conjugate-13 05/29/2014  . Pneumococcal Polysaccharide-23 05/07/2010, 12/05/2014  . Zoster 11/03/2006   Past Surgical History  Procedure Laterality Date  . Knee arthroscopy Left   . Cataract extraction      both   FHx:    Reviewed / unchanged  SHx:    Reviewed / unchanged  Systems Review:  Constitutional: Denies fever, chills, wt changes, headaches, insomnia, fatigue, night sweats, change in appetite. Eyes: Denies redness, blurred vision, diplopia, discharge, itchy, watery eyes.  ENT: Denies discharge, congestion, post nasal drip, epistaxis, sore throat, earache, hearing loss, dental pain, tinnitus, vertigo, sinus pain, snoring.  CV: Denies chest pain, palpitations, irregular heartbeat, syncope, dyspnea, diaphoresis, orthopnea, PND, claudication or edema. Respiratory: denies cough, dyspnea, DOE, pleurisy, hoarseness, laryngitis, wheezing.  Gastrointestinal: Denies dysphagia, odynophagia, heartburn,  reflux, water brash, abdominal pain or cramps, nausea, vomiting, bloating, diarrhea, constipation, hematemesis, melena, hematochezia  or hemorrhoids. Genitourinary: Denies dysuria, frequency, urgency, nocturia, hesitancy, discharge, hematuria or flank pain. Musculoskeletal: Denies arthralgias, myalgias, stiffness, jt. swelling, pain, limping or strain/sprain.  Skin: Denies pruritus, rash, hives, warts, acne, eczema or change in skin lesion(s). Neuro: No weakness, tremor, incoordination, spasms, paresthesia  or pain. Psychiatric: Denies confusion, memory loss or sensory loss. Endo: Denies change in weight, skin or hair change.  Heme/Lymph: No excessive bleeding, bruising or enlarged lymph nodes.  Physical Exam  BP 138/64 mmHg  Pulse 76  Temp(Src) 97.3 F (36.3 C)  Resp 16  Ht  (1.626 m)  Wt 121 lb 12.8 oz (55.248 kg)  BMI 20.90 kg/m2  Appears well nourished and in no distress. Eyes: PERRLA, EOMs, conjunctiva no swelling or erythema. Sinuses: No frontal/maxillary tenderness ENT/Mouth: EAC's clear, TM's nl w/o erythema, bulging. Nares clear w/o erythema, swelling, exudates. Oropharynx clear without erythema or exudates. Oral hygiene is good. Tongue normal, non obstructing. Hearing intact.  Neck: Supple. Thyroid nl. Car 2+/2+ without bruits, nodes or JVD. Chest: Respirations nl with BS clear & equal w/o rales, rhonchi, wheezing or stridor.  Cor: Heart sounds normal w/ regular rate and rhythm without sig. murmurs, gallops, clicks, or rubs. Peripheral pulses normal and equal  without edema.  Abdomen: Soft & bowel sounds normal. Non-tender w/o guarding, rebound, hernias, masses, or organomegaly.  Lymphatics: Unremarkable.  Musculoskeletal: Full ROM all peripheral extremities, joint stability, 5/5 strength, and normal gait.  Skin: Warm, dry without exposed rashes, lesions or ecchymosis apparent.  Neuro: Cranial nerves intact, reflexes equal bilaterally. Sensory-motor testing grossly intact. Tendon reflexes grossly intact.  Pysch: Alert & oriented x 3.  Insight and judgement nl & appropriate. No ideations.  Assessment and Plan:  1. Essential hypertension  - TSH  2. Hyperlipidemia  - Lipid panel - TSH  3. Other abnormal glucose  - Hemoglobin A1c - Insulin, random  4. Vitamin D deficiency  - VITAMIN D 25 Hydroxy   5. Hypothyroidism   6. Gastroesophageal reflux disease   7. Vitamin B12 deficiency  - cyanocobalamin ((VITAMIN B-12)) injection 500 mcg; Inject 0.5 mLs (500  mcg total) into the muscle once.  8. Medication management  - CBC with Differential/Platelet - BASIC METABOLIC PANEL WITH GFR - Hepatic function panel - Magnesium   Recommended regular exercise, BP monitoring, weight control, and discussed med and SE's. Recommended labs to assess and monitor clinical status. Further disposition pending results of labs. Over 30 minutes of exam, counseling, chart review was performed

## 2016-01-03 LAB — VITAMIN D 25 HYDROXY (VIT D DEFICIENCY, FRACTURES): VIT D 25 HYDROXY: 39 ng/mL (ref 30–100)

## 2016-01-03 LAB — TSH: TSH: 2.74 m[IU]/L

## 2016-01-03 LAB — INSULIN, RANDOM: INSULIN: 7.4 u[IU]/mL (ref 2.0–19.6)

## 2016-01-04 ENCOUNTER — Encounter: Payer: Self-pay | Admitting: Internal Medicine

## 2016-01-21 ENCOUNTER — Encounter (INDEPENDENT_AMBULATORY_CARE_PROVIDER_SITE_OTHER): Payer: Medicare Other | Admitting: Ophthalmology

## 2016-01-21 DIAGNOSIS — I10 Essential (primary) hypertension: Secondary | ICD-10-CM | POA: Diagnosis not present

## 2016-01-21 DIAGNOSIS — H35033 Hypertensive retinopathy, bilateral: Secondary | ICD-10-CM | POA: Diagnosis not present

## 2016-01-21 DIAGNOSIS — H353124 Nonexudative age-related macular degeneration, left eye, advanced atrophic with subfoveal involvement: Secondary | ICD-10-CM | POA: Diagnosis not present

## 2016-01-21 DIAGNOSIS — H34831 Tributary (branch) retinal vein occlusion, right eye, with macular edema: Secondary | ICD-10-CM | POA: Diagnosis not present

## 2016-01-21 DIAGNOSIS — H43813 Vitreous degeneration, bilateral: Secondary | ICD-10-CM

## 2016-01-21 DIAGNOSIS — H353111 Nonexudative age-related macular degeneration, right eye, early dry stage: Secondary | ICD-10-CM

## 2016-02-04 ENCOUNTER — Ambulatory Visit: Payer: Self-pay

## 2016-02-05 ENCOUNTER — Ambulatory Visit (INDEPENDENT_AMBULATORY_CARE_PROVIDER_SITE_OTHER): Payer: Medicare Other | Admitting: *Deleted

## 2016-02-05 DIAGNOSIS — D510 Vitamin B12 deficiency anemia due to intrinsic factor deficiency: Secondary | ICD-10-CM

## 2016-02-05 MED ORDER — CYANOCOBALAMIN 1000 MCG/ML IJ SOLN: 1000.0000 ug | Freq: Once | INTRAMUSCULAR | Status: DC

## 2016-02-05 NOTE — Progress Notes (Signed)
Patient ID: Lyman BishopSara W Sporer, female   DOB: 05-Apr-1928, 82 y.o.   MRN: [id number] Patient here for NV to have B 12 injection.  She received B12 500 mcg [place] in right upper arm and tolerated well.

## 2016-03-06 ENCOUNTER — Ambulatory Visit (INDEPENDENT_AMBULATORY_CARE_PROVIDER_SITE_OTHER): Payer: Medicare Other | Admitting: *Deleted

## 2016-03-06 ENCOUNTER — Encounter: Payer: Self-pay | Admitting: *Deleted

## 2016-03-06 VITALS — BP 136/62 | HR 72 | Temp 98.0°F | Resp 16 | Wt 122.0 lb

## 2016-03-06 DIAGNOSIS — E538 Deficiency of other specified B group vitamins: Secondary | ICD-10-CM | POA: Diagnosis not present

## 2016-04-07 ENCOUNTER — Encounter (INDEPENDENT_AMBULATORY_CARE_PROVIDER_SITE_OTHER): Payer: Medicare Other | Admitting: Ophthalmology

## 2016-04-07 ENCOUNTER — Ambulatory Visit: Payer: Self-pay | Admitting: Internal Medicine

## 2016-04-07 DIAGNOSIS — H34831 Tributary (branch) retinal vein occlusion, right eye, with macular edema: Secondary | ICD-10-CM

## 2016-04-07 DIAGNOSIS — H43813 Vitreous degeneration, bilateral: Secondary | ICD-10-CM | POA: Diagnosis not present

## 2016-04-07 DIAGNOSIS — I10 Essential (primary) hypertension: Secondary | ICD-10-CM | POA: Diagnosis not present

## 2016-04-07 DIAGNOSIS — H353111 Nonexudative age-related macular degeneration, right eye, early dry stage: Secondary | ICD-10-CM

## 2016-04-07 DIAGNOSIS — H35033 Hypertensive retinopathy, bilateral: Secondary | ICD-10-CM | POA: Diagnosis not present

## 2016-04-07 DIAGNOSIS — H353122 Nonexudative age-related macular degeneration, left eye, intermediate dry stage: Secondary | ICD-10-CM

## 2016-04-08 ENCOUNTER — Ambulatory Visit: Payer: Self-pay | Admitting: Internal Medicine

## 2016-04-08 ENCOUNTER — Other Ambulatory Visit: Payer: Self-pay | Admitting: Physician Assistant

## 2016-04-21 ENCOUNTER — Encounter: Payer: Self-pay | Admitting: Internal Medicine

## 2016-04-21 ENCOUNTER — Ambulatory Visit (INDEPENDENT_AMBULATORY_CARE_PROVIDER_SITE_OTHER): Payer: Medicare Other | Admitting: Internal Medicine

## 2016-04-21 VITALS — BP 138/60 | HR 76 | Temp 98.0°F | Resp 16 | Ht 64.0 in | Wt 124.0 lb

## 2016-04-21 DIAGNOSIS — Z79899 Other long term (current) drug therapy: Secondary | ICD-10-CM

## 2016-04-21 DIAGNOSIS — Z6821 Body mass index (BMI) 21.0-21.9, adult: Secondary | ICD-10-CM | POA: Diagnosis not present

## 2016-04-21 DIAGNOSIS — E559 Vitamin D deficiency, unspecified: Secondary | ICD-10-CM

## 2016-04-21 DIAGNOSIS — E538 Deficiency of other specified B group vitamins: Secondary | ICD-10-CM

## 2016-04-21 DIAGNOSIS — E039 Hypothyroidism, unspecified: Secondary | ICD-10-CM | POA: Diagnosis not present

## 2016-04-21 DIAGNOSIS — I10 Essential (primary) hypertension: Secondary | ICD-10-CM

## 2016-04-21 DIAGNOSIS — E785 Hyperlipidemia, unspecified: Secondary | ICD-10-CM | POA: Diagnosis not present

## 2016-04-21 DIAGNOSIS — R7309 Other abnormal glucose: Secondary | ICD-10-CM | POA: Diagnosis not present

## 2016-04-21 LAB — CBC WITH DIFFERENTIAL/PLATELET
BASOS ABS: 0 {cells}/uL (ref 0–200)
Basophils Relative: 0 %
EOS PCT: 2 %
Eosinophils Absolute: 94 cells/uL (ref 15–500)
HCT: 30.6 % — ABNORMAL LOW (ref 35.0–45.0)
Hemoglobin: 9.9 g/dL — ABNORMAL LOW (ref 11.7–15.5)
LYMPHS PCT: 30 %
Lymphs Abs: 1410 cells/uL (ref 850–3900)
MCH: 29.2 pg (ref 27.0–33.0)
MCHC: 32.4 g/dL (ref 32.0–36.0)
MCV: 90.3 fL (ref 80.0–100.0)
MONOS PCT: 10 %
MPV: 8.5 fL (ref 7.5–12.5)
Monocytes Absolute: 470 cells/uL (ref 200–950)
NEUTROS ABS: 2726 {cells}/uL (ref 1500–7800)
Neutrophils Relative %: 58 %
PLATELETS: 202 10*3/uL (ref 140–400)
RBC: 3.39 MIL/uL — ABNORMAL LOW (ref 3.80–5.10)
RDW: 14.4 % (ref 11.0–15.0)
WBC: 4.7 10*3/uL (ref 3.8–10.8)

## 2016-04-21 MED ORDER — CYANOCOBALAMIN 1000 MCG/ML IJ SOLN: INTRAMUSCULAR | Status: DC

## 2016-04-21 NOTE — Progress Notes (Signed)
Assessment and Plan:  Hypertension:  -Continue medication,  -monitor blood pressure at home.  -Continue DASH diet.   -Reminder to go to the ER if any CP, SOB, nausea, dizziness, severe HA, changes vision/speech, left arm numbness and tingling, and jaw pain.  Cholesterol: -Continue diet and exercise.  -Check cholesterol.   Pre-diabetes: -Continue diet and exercise.  -Check A1C  Vitamin D Def: -check level -continue medications.   Vit B12 deficiency -shot given today -asymptomatic  BMI 21 -discussed healthy eating habits and exercise as she can tolerate  Continue diet and meds as discussed. Further disposition pending results of labs.  HPI 82 y.o. female  presents for 3 month follow up with hypertension, hyperlipidemia, prediabetes and vitamin D.   Her blood pressure has been controlled at home, today their BP is BP: 138/60 mmHg.   She does workout. She denies chest pain, shortness of breath, dizziness.   She is on cholesterol medication and denies myalgias. Her cholesterol is at goal. The cholesterol last visit was:   Lab Results  Component Value Date   CHOL 159 01/02/2016   HDL 38* 01/02/2016   LDLCALC 83 01/02/2016   TRIG 191* 01/02/2016   CHOLHDL 4.2 01/02/2016     She has been working on diet and exercise for prediabetes, and denies foot ulcerations, hyperglycemia, hypoglycemia , increased appetite, nausea, paresthesia of the feet, polydipsia, polyuria, visual disturbances, vomiting and weight loss. Last A1C in the office was:  Lab Results  Component Value Date   HGBA1C 5.7* 01/02/2016    Patient is on Vitamin D supplement.  Lab Results  Component Value Date   VD25OH 39 01/02/2016     She notes that she is otherwise well.  She has not complaints currently.  She is sleeping well.  She is not exercising, but is making an effort to eat healthy and to gain weight.    Current Medications:  Current Outpatient Prescriptions on File Prior to Visit  Medication Sig  Dispense Refill  . ALPRAZolam (XANAX) 0.25 MG tablet TAKE 1 TABLET BY MOUTH EVERY DAY AT BEDTIME OR AS NEEDED 60 tablet 1  . amitriptyline (ELAVIL) 50 MG tablet TAKE 1 TABLET BY MOUTH AT BEDTIME 90 tablet 1  . amLODipine (NORVASC) 2.5 MG tablet TAKE ONE TABLET BY MOUTH ONCE DAILY 90 tablet 0  . BESIVANCE 0.6 % SUSP   0  . Cholecalciferol (VITAMIN D-3) 5000 UNITS TABS Take 5,000 Units by mouth daily.    . cyanocobalamin (,VITAMIN B-12,) 1000 MCG/ML injection INJECT 0.5 MLS (500 MCG TOTAL) INTO THE MUSCLE EVERY 30 (THIRTY) DAYS. 10 mL 3  . levothyroxine (SYNTHROID, LEVOTHROID) 125 MCG tablet TAKE 1 TABLET BY MOUTH BEFORE BREAKFAST FOR 5 DAYS THEN 1 AND 1/2 TABLETS ON TUES. THURS. (Patient taking differently: TAKE 1 TABLET BY MOUTH BEFORE BREAKFAST OR AS DIRECTED.) 90 tablet 2  . meloxicam (MOBIC) 7.5 MG tablet Take one pill twice daily with food as need for pain, can take with tylenol, can not take with aleve, iburpofen 60 tablet 3  . Multiple Vitamins-Minerals (PRESERVISION AREDS PO) Take 1 capsule by mouth 2 (two) times daily.    Marland Kitchen omeprazole (PRILOSEC) 40 MG capsule Take 1 capsule (40 mg total) by mouth daily. 90 capsule 3   Current Facility-Administered Medications on File Prior to Visit  Medication Dose Route Frequency Provider Last Rate Last Dose  . cyanocobalamin ((VITAMIN B-12)) injection 500 mcg  500 mcg Intramuscular Q30 days Quentin Mulling, PA-C   500 mcg at 04/21/16  1459    Medical History:  Past Medical History  Diagnosis Date  . Hypertension   . Hyperlipidemia   . Thyroid disease   . Anxiety   . Depression   . Anemia   . GERD (gastroesophageal reflux disease)   . Allergy   . DDD (degenerative disc disease), lumbar   . OA (osteoarthritis) of knee     right knee  . Vitamin D deficiency   . Pre-diabetes     Allergies:  Allergies  Allergen Reactions  . Amlodipine Swelling    Swelling/edema  . Norvasc [Amlodipine Besylate]   . Sulfa Antibiotics      Review of  Systems:  Review of Systems  Constitutional: Negative for fever, chills and malaise/fatigue.  HENT: Negative for congestion, ear pain and sore throat.   Eyes: Negative.   Respiratory: Negative for cough, shortness of breath and wheezing.   Cardiovascular: Negative for chest pain, palpitations and leg swelling.  Gastrointestinal: Negative for heartburn, abdominal pain, diarrhea, constipation, blood in stool and melena.  Genitourinary: Negative.   Skin: Negative.   Neurological: Negative for dizziness, sensory change, loss of consciousness and headaches.  Psychiatric/Behavioral: Negative for depression. The patient is not nervous/anxious and does not have insomnia.     Family history- Review and unchanged  Social history- Review and unchanged  Physical Exam: BP 138/60 mmHg  Pulse 76  Temp(Src) 98 F (36.7 C) (Temporal)  Resp 16  Ht 5\' 4"  (1.626 m)  Wt 124 lb (56.246 kg)  BMI 21.27 kg/m2 Wt Readings from Last 3 Encounters:  04/21/16 124 lb (56.246 kg)  03/06/16 122 lb (55.339 kg)  01/02/16 121 lb 12.8 oz (55.248 kg)    General Appearance: Well nourished well developed, in no apparent distress. Eyes: PERRLA, EOMs, conjunctiva no swelling or erythema ENT/Mouth: Ear canals normal without obstruction, swelling, erythma, discharge.  TMs normal bilaterally.  Oropharynx moist, clear, without exudate, or postoropharyngeal swelling. Neck: Supple, thyroid normal,no cervical adenopathy  Respiratory: Respiratory effort normal, Breath sounds clear A&P without rhonchi, wheeze, or rale.  No retractions, no accessory usage. Cardio: RRR with no MRGs. Brisk peripheral pulses without edema.  Abdomen: Soft, + BS,  Non tender, no guarding, rebound, hernias, masses. Musculoskeletal: Full ROM, 5/5 strength, Normal gait Skin: Warm, dry without rashes, lesions, ecchymosis.  Neuro: Awake and oriented X 3, Cranial nerves intact. Normal muscle tone, no cerebellar symptoms. Psych: Normal affect, Insight  and Judgment appropriate.    Terri Piedraourtney Forcucci, PA-C 3:24 PM [facility]

## 2016-04-22 LAB — LIPID PANEL
CHOLESTEROL: 168 mg/dL (ref 125–200)
HDL: 45 mg/dL — ABNORMAL LOW (ref >=46)
LDL Cholesterol: 97 mg/dL (ref <130)
TRIGLYCERIDES: 128 mg/dL (ref <150)
Total CHOL/HDL Ratio: 3.7 Ratio (ref <=5.0)
VLDL: 26 mg/dL (ref <30)

## 2016-04-22 LAB — HEMOGLOBIN A1C
Hgb A1c MFr Bld: 5.9 % — ABNORMAL HIGH (ref <5.7)
MEAN PLASMA GLUCOSE: 123 mg/dL

## 2016-04-22 LAB — HEPATIC FUNCTION PANEL
ALBUMIN: 3.8 g/dL (ref 3.6–5.1)
ALT: 11 U/L (ref 6–29)
AST: 21 U/L (ref 10–35)
Alkaline Phosphatase: 75 U/L (ref 33–130)
BILIRUBIN DIRECT: 0.1 mg/dL (ref <=0.2)
BILIRUBIN TOTAL: 0.4 mg/dL (ref 0.2–1.2)
Indirect Bilirubin: 0.3 mg/dL (ref 0.2–1.2)
Total Protein: 6.3 g/dL (ref 6.1–8.1)

## 2016-04-22 LAB — BASIC METABOLIC PANEL WITH GFR
BUN: 23 mg/dL (ref 7–25)
CALCIUM: 9.1 mg/dL (ref 8.6–10.4)
CO2: 19 mmol/L — AB (ref 20–31)
CREATININE: 1.13 mg/dL — AB (ref 0.60–0.88)
Chloride: 105 mmol/L (ref 98–110)
GFR, Est African American: 51 mL/min — ABNORMAL LOW (ref >=60)
GFR, Est Non African American: 44 mL/min — ABNORMAL LOW (ref >=60)
Glucose, Bld: 90 mg/dL (ref 65–99)
Potassium: 4.4 mmol/L (ref 3.5–5.3)
SODIUM: 136 mmol/L (ref 135–146)

## 2016-04-22 LAB — TSH: TSH: 6.49 m[IU]/L — AB

## 2016-04-30 ENCOUNTER — Other Ambulatory Visit: Payer: Medicare Other

## 2016-04-30 ENCOUNTER — Other Ambulatory Visit: Payer: Self-pay | Admitting: Internal Medicine

## 2016-04-30 DIAGNOSIS — D649 Anemia, unspecified: Secondary | ICD-10-CM

## 2016-04-30 DIAGNOSIS — E039 Hypothyroidism, unspecified: Secondary | ICD-10-CM

## 2016-04-30 LAB — CBC WITH DIFFERENTIAL/PLATELET
BASOS PCT: 0 %
Basophils Absolute: 0 cells/uL (ref 0–200)
EOS ABS: 49 {cells}/uL (ref 15–500)
Eosinophils Relative: 1 %
HEMATOCRIT: 32.5 % — AB (ref 35.0–45.0)
HEMOGLOBIN: 10.5 g/dL — AB (ref 11.7–15.5)
LYMPHS ABS: 1225 {cells}/uL (ref 850–3900)
Lymphocytes Relative: 25 %
MCH: 28.8 pg (ref 27.0–33.0)
MCHC: 32.3 g/dL (ref 32.0–36.0)
MCV: 89.3 fL (ref 80.0–100.0)
MONO ABS: 343 {cells}/uL (ref 200–950)
MPV: 8.5 fL (ref 7.5–12.5)
Monocytes Relative: 7 %
Neutro Abs: 3283 cells/uL (ref 1500–7800)
Neutrophils Relative %: 67 %
Platelets: 225 10*3/uL (ref 140–400)
RBC: 3.64 MIL/uL — AB (ref 3.80–5.10)
RDW: 14.5 % (ref 11.0–15.0)
WBC: 4.9 10*3/uL (ref 3.8–10.8)

## 2016-04-30 LAB — RETICULOCYTES
ABS Retic: 43680 cells/uL (ref 20000–80000)
RBC.: 3.64 MIL/uL — ABNORMAL LOW (ref 3.80–5.10)
RETIC CT PCT: 1.2 %

## 2016-05-01 LAB — VITAMIN B12: Vitamin B-12: 711 pg/mL (ref 200–1100)

## 2016-05-01 LAB — IRON AND TIBC
%SAT: 17 % (ref 11–50)
Iron: 45 ug/dL (ref 45–160)
TIBC: 259 ug/dL (ref 250–450)
UIBC: 214 ug/dL (ref 125–400)

## 2016-05-01 LAB — TSH: TSH: 3.48 mIU/L

## 2016-05-20 ENCOUNTER — Ambulatory Visit (INDEPENDENT_AMBULATORY_CARE_PROVIDER_SITE_OTHER): Payer: Medicare Other

## 2016-05-20 DIAGNOSIS — E538 Deficiency of other specified B group vitamins: Secondary | ICD-10-CM

## 2016-05-20 MED ORDER — CYANOCOBALAMIN 1000 MCG/ML IJ SOLN
1000.0000 ug | Freq: Once | INTRAMUSCULAR | Status: AC
  Administered 2016-05-20: 1000 ug via SUBCUTANEOUS

## 2016-05-20 NOTE — Progress Notes (Signed)
Pt presents for B12 injections w/o question or concerns. Pt was given the injection in her right arm subQ 1mL.

## 2016-05-21 ENCOUNTER — Ambulatory Visit: Payer: Self-pay

## 2016-06-09 ENCOUNTER — Encounter (INDEPENDENT_AMBULATORY_CARE_PROVIDER_SITE_OTHER): Payer: Medicare Other | Admitting: Ophthalmology

## 2016-06-09 DIAGNOSIS — H34831 Tributary (branch) retinal vein occlusion, right eye, with macular edema: Secondary | ICD-10-CM

## 2016-06-09 DIAGNOSIS — I10 Essential (primary) hypertension: Secondary | ICD-10-CM

## 2016-06-09 DIAGNOSIS — H353122 Nonexudative age-related macular degeneration, left eye, intermediate dry stage: Secondary | ICD-10-CM | POA: Diagnosis not present

## 2016-06-09 DIAGNOSIS — H35033 Hypertensive retinopathy, bilateral: Secondary | ICD-10-CM

## 2016-06-09 DIAGNOSIS — H43813 Vitreous degeneration, bilateral: Secondary | ICD-10-CM

## 2016-06-12 ENCOUNTER — Ambulatory Visit (INDEPENDENT_AMBULATORY_CARE_PROVIDER_SITE_OTHER): Payer: Medicare Other | Admitting: *Deleted

## 2016-06-12 DIAGNOSIS — H60392 Other infective otitis externa, left ear: Secondary | ICD-10-CM | POA: Diagnosis not present

## 2016-06-12 DIAGNOSIS — H6122 Impacted cerumen, left ear: Secondary | ICD-10-CM

## 2016-06-12 MED ORDER — HYDROCORTISONE-ACETIC ACID 1-2 % OT SOLN: 3.0000 [drp] | Freq: Two times a day (BID) | OTIC | 1 refills | Status: DC

## 2016-06-12 NOTE — Progress Notes (Signed)
Patient presents for ear lavage, left ear.  Patient went to have hearing aids checked and was told she needed ear lavage d/t cerumen impaction. Successful lavage  performed on left ear and large, hard wax removed.  Upon examining ear, patient's ear looked like there may be a possible fungal infection.  Quentin MullingAmanda Collier, PA-C examined ear and sent in ear drop Rx for further Tx.  Patient expressed understanding and will f/u in office if any further concerns or complaints.

## 2016-06-24 ENCOUNTER — Other Ambulatory Visit: Payer: Self-pay | Admitting: *Deleted

## 2016-06-24 ENCOUNTER — Ambulatory Visit (INDEPENDENT_AMBULATORY_CARE_PROVIDER_SITE_OTHER): Payer: Medicare Other | Admitting: *Deleted

## 2016-06-24 DIAGNOSIS — E559 Vitamin D deficiency, unspecified: Secondary | ICD-10-CM

## 2016-06-24 MED ORDER — ALPRAZOLAM 0.25 MG PO TABS: ORAL_TABLET | ORAL | 0 refills | Status: DC

## 2016-06-24 MED ORDER — AMLODIPINE BESYLATE 2.5 MG PO TABS
2.5000 mg | ORAL_TABLET | Freq: Every day | ORAL | 0 refills | Status: DC
Start: 2016-06-24 — End: 2016-10-07

## 2016-06-24 NOTE — Progress Notes (Signed)
Patient presents for B12 injection.  Patient received 500 mcg SubQ left arm and tolerated well.  Will RTC in 1 month for next injection.

## 2016-06-30 ENCOUNTER — Encounter: Payer: Self-pay | Admitting: Physician Assistant

## 2016-07-10 ENCOUNTER — Ambulatory Visit (INDEPENDENT_AMBULATORY_CARE_PROVIDER_SITE_OTHER): Payer: Medicare Other | Admitting: Physician Assistant

## 2016-07-10 ENCOUNTER — Encounter: Payer: Self-pay | Admitting: Physician Assistant

## 2016-07-10 VITALS — BP 128/66 | HR 83 | Temp 97.5°F | Resp 14 | Ht 63.0 in | Wt 123.2 lb

## 2016-07-10 DIAGNOSIS — Z79899 Other long term (current) drug therapy: Secondary | ICD-10-CM | POA: Diagnosis not present

## 2016-07-10 DIAGNOSIS — H3530 Unspecified macular degeneration: Secondary | ICD-10-CM | POA: Diagnosis not present

## 2016-07-10 DIAGNOSIS — J449 Chronic obstructive pulmonary disease, unspecified: Secondary | ICD-10-CM | POA: Diagnosis not present

## 2016-07-10 DIAGNOSIS — R7309 Other abnormal glucose: Secondary | ICD-10-CM

## 2016-07-10 DIAGNOSIS — K219 Gastro-esophageal reflux disease without esophagitis: Secondary | ICD-10-CM | POA: Diagnosis not present

## 2016-07-10 DIAGNOSIS — Z0000 Encounter for general adult medical examination without abnormal findings: Secondary | ICD-10-CM

## 2016-07-10 DIAGNOSIS — E039 Hypothyroidism, unspecified: Secondary | ICD-10-CM

## 2016-07-10 DIAGNOSIS — I10 Essential (primary) hypertension: Secondary | ICD-10-CM

## 2016-07-10 DIAGNOSIS — R6889 Other general symptoms and signs: Secondary | ICD-10-CM | POA: Diagnosis not present

## 2016-07-10 DIAGNOSIS — M545 Low back pain, unspecified: Secondary | ICD-10-CM

## 2016-07-10 DIAGNOSIS — F419 Anxiety disorder, unspecified: Secondary | ICD-10-CM

## 2016-07-10 DIAGNOSIS — Z23 Encounter for immunization: Secondary | ICD-10-CM

## 2016-07-10 DIAGNOSIS — E559 Vitamin D deficiency, unspecified: Secondary | ICD-10-CM | POA: Diagnosis not present

## 2016-07-10 DIAGNOSIS — D519 Vitamin B12 deficiency anemia, unspecified: Secondary | ICD-10-CM

## 2016-07-10 DIAGNOSIS — E785 Hyperlipidemia, unspecified: Secondary | ICD-10-CM

## 2016-07-10 DIAGNOSIS — Z0001 Encounter for general adult medical examination with abnormal findings: Secondary | ICD-10-CM

## 2016-07-10 LAB — BASIC METABOLIC PANEL WITH GFR
BUN: 15 mg/dL (ref 7–25)
CALCIUM: 9.5 mg/dL (ref 8.6–10.4)
CO2: 23 mmol/L (ref 20–31)
CREATININE: 0.85 mg/dL (ref 0.60–0.88)
Chloride: 107 mmol/L (ref 98–110)
GFR, EST AFRICAN AMERICAN: 72 mL/min (ref >=60)
GFR, Est Non African American: 62 mL/min (ref >=60)
GLUCOSE: 92 mg/dL (ref 65–99)
POTASSIUM: 3.7 mmol/L (ref 3.5–5.3)
Sodium: 135 mmol/L (ref 135–146)

## 2016-07-10 LAB — CBC WITH DIFFERENTIAL/PLATELET
BASOS PCT: 0 %
Basophils Absolute: 0 cells/uL (ref 0–200)
EOS ABS: 59 {cells}/uL (ref 15–500)
Eosinophils Relative: 1 %
HEMATOCRIT: 31.7 % — AB (ref 35.0–45.0)
Hemoglobin: 10.3 g/dL — ABNORMAL LOW (ref 11.7–15.5)
LYMPHS PCT: 30 %
Lymphs Abs: 1770 cells/uL (ref 850–3900)
MCH: 29 pg (ref 27.0–33.0)
MCHC: 32.5 g/dL (ref 32.0–36.0)
MCV: 89.3 fL (ref 80.0–100.0)
MONOS PCT: 8 %
MPV: 8.4 fL (ref 7.5–12.5)
Monocytes Absolute: 472 cells/uL (ref 200–950)
NEUTROS PCT: 61 %
Neutro Abs: 3599 cells/uL (ref 1500–7800)
PLATELETS: 222 10*3/uL (ref 140–400)
RBC: 3.55 MIL/uL — AB (ref 3.80–5.10)
RDW: 14.2 % (ref 11.0–15.0)
WBC: 5.9 10*3/uL (ref 3.8–10.8)

## 2016-07-10 LAB — HEPATIC FUNCTION PANEL
ALBUMIN: 3.8 g/dL (ref 3.6–5.1)
ALK PHOS: 78 U/L (ref 33–130)
ALT: 12 U/L (ref 6–29)
AST: 20 U/L (ref 10–35)
BILIRUBIN TOTAL: 0.3 mg/dL (ref 0.2–1.2)
Bilirubin, Direct: 0.1 mg/dL (ref <=0.2)
Indirect Bilirubin: 0.2 mg/dL (ref 0.2–1.2)
TOTAL PROTEIN: 6.4 g/dL (ref 6.1–8.1)

## 2016-07-10 LAB — LIPID PANEL
CHOL/HDL RATIO: 3.8 ratio (ref <=5.0)
CHOLESTEROL: 170 mg/dL (ref 125–200)
HDL: 45 mg/dL — AB (ref >=46)
LDL Cholesterol: 97 mg/dL (ref <130)
Triglycerides: 141 mg/dL (ref <150)
VLDL: 28 mg/dL (ref <30)

## 2016-07-10 LAB — TSH: TSH: 2.08 m[IU]/L

## 2016-07-10 NOTE — Patient Instructions (Signed)

## 2016-07-10 NOTE — Progress Notes (Signed)
MEDICARE ANNUAL WELLNESS VISIT AND CPE  Assessment:   1. Essential hypertension - continue medications, DASH diet, exercise and monitor at home. Call if greater than 130/80.  - CBC with Differential/Platelet - BASIC METABOLIC PANEL WITH GFR - Hepatic function panel - TSH - Urinalysis, Routine w reflex microscopic (not at [facility]) - Microalbumin / creatinine urine ratio - EKG 12-Lead  2. Hyperlipidemia -continue medications, check lipids, decrease fatty foods, increase activity.  - Lipid panel  3. Other abnormal glucose Discussed general issues about diabetes pathophysiology and management., Educational material distributed., Suggested low cholesterol diet., Encouraged aerobic exercise., Discussed foot care., Reminded to get yearly retinal exam. - Hemoglobin A1c  4. Vitamin D deficiency Continue supplement  5. Hypothyroidism, unspecified hypothyroidism type Hypothyroidism-check TSH level, continue medications the same, reminded to take on an empty stomach 30-39mins before food.  - TSH  6. Anemia due to vitamin B12 deficiency - monitor, continue iron supp with Vitamin C and increase green leafy veggies - CBC with Differential/Platelet  7. Medication management  8. Gastroesophageal reflux disease, esophagitis presence not specified Continue PPI/H2 blocker, diet discussed  9. Anxiety continue medications, stress management techniques discussed, increase water, good sleep hygiene discussed, increase exercise, and increase veggies.   10. Macular degeneration Continue follow up  11. Midline low back pain without sciatica stable  12. Encounter for Medicare annual wellness exam  13. Chronic obstructive pulmonary disease, unspecified COPD type (HCC) No SOB, avoid triggers  14. Needs flu shot - Flu vaccine HIGH DOSE PF  Over 40 minutes of exam, counseling, chart review and critical decision making was performed Future Appointments Date Time Provider Department Center   08/18/2016 12:15 PM Sherrie George, MD [facility] None  01/08/2017 2:00 PM Lucky Cowboy, MD [facility] None  07/29/2017 2:00 PM Quentin Mulling, PA-C [facility] None     Plan:   During the course of the visit the patient was educated and counseled about appropriate screening and preventive services including:    Pneumococcal vaccine   Prevnar 13  Influenza vaccine  Td vaccine  Screening electrocardiogram  Bone densitometry screening  Colorectal cancer screening  Diabetes screening  Glaucoma screening  Nutrition counseling   Advanced directives: requested   Subjective:  Shelby Bradley is a 82 y.o. female who presents for Medicare Annual Wellness Visit and complete physical.    Her blood pressure has been controlled at home, today their BP is BP: 128/66  She does not workout. She denies chest pain, shortness of breath, dizziness.  She is not on cholesterol medication and denies myalgias. Her cholesterol is at goal. The cholesterol last visit was:   Lab Results  Component Value Date   CHOL 168 04/21/2016   HDL 45 (L) 04/21/2016   LDLCALC 97 04/21/2016   TRIG 128 04/21/2016   CHOLHDL 3.7 04/21/2016    She has been working on diet and exercise for prediabetes, and denies paresthesia of the feet, polydipsia, polyuria and visual disturbances. Last A1C in the office was:  Lab Results  Component Value Date   HGBA1C 5.9 (H) 04/21/2016   Last GFR: Lab Results  Component Value Date   GFRNONAA 44 (L) 04/21/2016   Patient is on Vitamin D supplement.   Lab Results  Component Value Date   VD25OH 39 01/02/2016     She is on thyroid medication. Her medication was changed last visit, she is on  1 pill sat and sun and 1/2 pill the rest of the week.   Lab Results  Component Value Date   TSH 3.48 04/30/2016  .  She has chronic hearing loss in left ear from mastoid sx when she was younger, wears bilateral hearing aids.  Follows up with Dr. Dione Booze regularly, and he sent  her to Dr. Molli Hazard for mac degeneration, she got injections, she does still drive but does not drive at night.  She is on elavil for migraines/headaches that helps, takes 1/2 of elavil.  She will take xanax occ during the day for anxiety.  BMI is Body mass index is 21.82 kg/m., she is working gaining weight.  Wt Readings from Last 3 Encounters:  07/10/16 123 lb 3.2 oz (55.9 kg)  04/21/16 124 lb (56.2 kg)  03/06/16 122 lb (55.3 kg)    Medication Review: Current Outpatient Prescriptions on File Prior to Visit  Medication Sig Dispense Refill  . acetic acid-hydrocortisone (VOSOL-HC) otic solution Place 3 drops into the left ear 2 (two) times daily. 10 mL 1  . ALPRAZolam (XANAX) 0.25 MG tablet TAKE 1 TABLET BY MOUTH EVERY DAY AT BEDTIME OR AS NEEDED 60 tablet 0  . amitriptyline (ELAVIL) 50 MG tablet TAKE 1 TABLET BY MOUTH AT BEDTIME 90 tablet 1  . amLODipine (NORVASC) 2.5 MG tablet Take 1 tablet (2.5 mg total) by mouth daily. 90 tablet 0  . BESIVANCE 0.6 % SUSP   0  . Cholecalciferol (VITAMIN D-3) 5000 UNITS TABS Take 5,000 Units by mouth daily.    . cyanocobalamin (,VITAMIN B-12,) 1000 MCG/ML injection INJECT 0.5 MLS (500 MCG TOTAL) INTO THE MUSCLE EVERY 30 (THIRTY) DAYS. 10 mL 3  . levothyroxine (SYNTHROID, LEVOTHROID) 125 MCG tablet TAKE 1 TABLET BY MOUTH BEFORE BREAKFAST FOR 5 DAYS THEN 1 AND 1/2 TABLETS ON TUES. THURS. (Patient taking differently: TAKE 1 TABLET BY MOUTH BEFORE BREAKFAST OR AS DIRECTED.) 90 tablet 2  . meloxicam (MOBIC) 7.5 MG tablet Take one pill twice daily with food as need for pain, can take with tylenol, can not take with aleve, iburpofen 60 tablet 3  . Multiple Vitamins-Minerals (PRESERVISION AREDS PO) Take 1 capsule by mouth 2 (two) times daily.    Marland Kitchen omeprazole (PRILOSEC) 40 MG capsule Take 1 capsule (40 mg total) by mouth daily. 90 capsule 3   Current Facility-Administered Medications on File Prior to Visit  Medication Dose Route Frequency Provider Last Rate Last  Dose  . cyanocobalamin ((VITAMIN B-12)) injection 500 mcg  500 mcg Intramuscular Q30 days Quentin Mulling, PA-C   500 mcg at 06/24/16 1410    Allergies  Allergen Reactions  . Amlodipine Swelling    Swelling/edema  . Norvasc [Amlodipine Besylate]   . Sulfa Antibiotics     Current Problems (verified) Patient Active Problem List   Diagnosis Date Noted  . COPD (chronic obstructive pulmonary disease) (HCC) 07/10/2016  . Encounter for Medicare annual wellness exam 10/02/2015  . Hypothyroidism 06/28/2015  . Macular degeneration 06/28/2015  . Lower back pain 06/28/2015  . Medication management 12/05/2014  . Hypertension   . Hyperlipidemia   . Anxiety   . Anemia   . GERD (gastroesophageal reflux disease)   . Vitamin D deficiency   . Other abnormal glucose     Screening Tests Immunization History  Administered Date(s) Administered  . DTaP 05/20/2012  . Influenza Whole 07/21/2013  . Influenza, High Dose Seasonal PF 06/29/2014, 07/30/2015  . Pneumococcal Conjugate-13 05/29/2014  . Pneumococcal Polysaccharide-23 05/07/2010, 12/05/2014  . Zoster 11/03/2006   Preventative care: Last colonoscopy: 2010 will not get another Last mammogram: 2009 declines  another Last pap smear/pelvic exam: remote DEXA: 2013 osteoporosis, declines another  Prior vaccinations: TD or Tdap: 2013  Influenza: 2016 DUE Pneumococcal: 2016 Prevnar13: 2015 Shingles/Zostavax: 2008  Names of Other Physician/Practitioners you currently use: 1. [facility] here for primary care 2. Dr. Dione BoozeGroat, eye doctor, once a year 3. Evelena Asae. Zigler, dentist, once a year 4. Dr. Ashley RoyaltyMatthews, treated MD Patient Care Team: Lucky CowboyWilliam McKeown, MD as PCP - General (Internal Medicine) Carman ChingJames Edwards, MD as Consulting Physician (Gastroenterology) Sanjuana LettersBenjamin Graves, MD as Referring Physician (Orthopedic Surgery) Sallye Lathristopher Groat, MD as Consulting Physician (Optometry)  SURGICAL HISTORY She  has a past  surgical history that includes Knee arthroscopy (Left) and Cataract extraction. FAMILY HISTORY Her family history includes COPD in her father; Heart attack in her brother and brother; Hypertension in her mother. SOCIAL HISTORY She  reports that she has never smoked. She does not have any smokeless tobacco history on file. She reports that she does not drink alcohol or use drugs.   MEDICARE WELLNESS OBJECTIVES: Physical activity: Current Exercise Habits: The patient does not participate in regular exercise at present Cardiac risk factors: Cardiac Risk Factors include: advanced age (>2055men, 42>65 women);hypertension;sedentary lifestyle;dyslipidemia Depression/mood screen:   Dep[facility] screen [facility] 07/10/2016  Decreased Interest 0  Down, Depressed, Hopeless 0  PHQ - 2 Score 0    ADLs:  In your present state of health, do you have any difficulty performing the following activities: 07/10/2016 01/04/2016  Hearing? Y N  Vision? N N  Difficulty concentrating or making decisions? N N  Walking or climbing stairs? Y N  Dressing or bathing? N N  Doing errands, shopping? Y N  Some recent data might be hidden    Cognitive Testing  Alert? Yes  Normal Appearance?Yes  Oriented to person? Yes  Place? Yes   Time? Yes  Recall of three objects?  Yes  Can perform simple calculations? Yes  Displays appropriate judgment?Yes  Can read the correct time from a watch face?Yes  EOL planning: Does patient have an advance directive?: Yes Type of Advance Directive: Healthcare Power of Attorney, Living will Does patient want to make changes to advanced directive?: No - Patient declined Copy of advanced directive(s) in chart?: No - copy requested  Review of Systems  Constitutional: Negative for chills, fever and malaise/fatigue.  HENT: Negative for congestion, ear pain and sore throat.   Eyes: Negative.   Respiratory: Negative for cough, shortness of breath and wheezing.   Cardiovascular: Negative for chest  pain, palpitations and leg swelling.  Gastrointestinal: Negative for abdominal pain, blood in stool, constipation, diarrhea, heartburn and melena.  Genitourinary: Negative.   Skin: Negative.   Neurological: Negative for dizziness, sensory change, loss of consciousness and headaches.  Psychiatric/Behavioral: Negative for depression. The patient is not nervous/anxious and does not have insomnia.      Objective:     Today's Vitals   07/10/16 1420  BP: 128/66  Pulse: 83  Resp: 14  Temp: 97.5 F (36.4 C)  SpO2: 96%  Weight: 123 lb 3.2 oz (55.9 kg)  Height: 5\' 3"  (1.6 m)   Body mass index is 21.82 kg/m.  General appearance: alert, no distress, WD/WN,  female HEENT: Left eye ptosis, normocephalic, sclerae anicteric, TM pearly on left, right TM white/scarred, nares patent, no discharge or erythema, pharynx normal Oral cavity: MMM, no lesions Neck: supple, no lymphadenopathy, no thyromegaly, no masses Heart: RRR, normal S1, S2, no murmurs Lungs: CTA bilaterally, no wheezes, rhonchi, or rales Abdomen: +  bs, soft, non tender, non distended, no masses, no hepatomegaly, no splenomegaly Musculoskeletal: nontender, no swelling, no obvious deformity, mild- moderate cervical kyphosis Extremities: no edema, no cyanosis, no clubbing Pulses: 2+ symmetric, upper and lower extremities, normal cap refill Neurological: alert, oriented x 3, CN2-12 intact, strength normal upper extremities and lower extremities, sensation normal throughout, DTRs 2+ throughout, no cerebellar signs, gait normal Psychiatric: normal affect, behavior normal, pleasant  Breast: nontender, no masses or lumps, no skin changes, no nipple discharge or inversion, no axillary lymphadenopathy  Medicare Attestation I have personally reviewed: The patient's medical and social history Their use of alcohol, tobacco or illicit drugs Their current medications and supplements The patient's functional ability including ADLs,fall risks,  home safety risks, cognitive, and hearing and visual impairment Diet and physical activities Evidence for depression or mood disorders  The patient's weight, height, BMI, and visual acuity have been recorded in the chart.  I have made referrals, counseling, and provided education to the patient based on review of the above and I have provided the patient with a written personalized care plan for preventive services.     Quentin Mulling, PA-C   07/10/2016

## 2016-07-11 LAB — URINALYSIS, MICROSCOPIC ONLY
BACTERIA UA: NONE SEEN [HPF]
CRYSTALS: NONE SEEN [HPF]
Casts: NONE SEEN [LPF]
RBC / HPF: NONE SEEN RBC/HPF (ref <=2)
Squamous Epithelial / LPF: NONE SEEN [HPF] (ref <=5)
WBC UA: NONE SEEN WBC/HPF (ref <=5)
Yeast: NONE SEEN [HPF]

## 2016-07-11 LAB — URINALYSIS, ROUTINE W REFLEX MICROSCOPIC
Bilirubin Urine: NEGATIVE
GLUCOSE, UA: NEGATIVE
Ketones, ur: NEGATIVE
LEUKOCYTES UA: NEGATIVE
Nitrite: NEGATIVE
PH: 5.5 (ref 5.0–8.0)
PROTEIN: NEGATIVE
Specific Gravity, Urine: 1.006 (ref 1.001–1.035)

## 2016-07-11 LAB — MICROALBUMIN / CREATININE URINE RATIO
Creatinine, Urine: 30 mg/dL (ref 20–320)
MICROALB UR: 0.3 mg/dL
MICROALB/CREAT RATIO: 10 ug/mg{creat} (ref <30)

## 2016-07-11 LAB — HEMOGLOBIN A1C
Hgb A1c MFr Bld: 5.6 % (ref <5.7)
Mean Plasma Glucose: 114 mg/dL

## 2016-07-22 ENCOUNTER — Telehealth: Payer: Self-pay | Admitting: Physician Assistant

## 2016-07-22 ENCOUNTER — Other Ambulatory Visit: Payer: Self-pay | Admitting: Physician Assistant

## 2016-07-22 NOTE — Progress Notes (Signed)
flexeril

## 2016-07-22 NOTE — Telephone Encounter (Signed)
-----   Message from Gregery NaAngela D Duff, CMA sent at 07/22/2016  2:41 PM EDT ----- Regarding: Question Contact: [phone] Pt's daughter states her mother is under a lot of stress due to just finding out son has cancer & she is not eating or drinking much. Pt's daughter  wants to know if it is ok if she takes a half tablet of her xanax during the day. Thinks it may help her mother out.

## 2016-07-22 NOTE — Telephone Encounter (Signed)
[name] is her daughter.  Will increase elavil to 50mg  to help with TMJ, saw oral surgeon.  She found out that her son has cancer and doing chemo, and sudden death in the family.  Can take xanax 0.25mg  BID if needed for nerves.  Suggest follow up in the office if anything changes or not improved.

## 2016-07-28 ENCOUNTER — Ambulatory Visit: Payer: Self-pay

## 2016-07-28 ENCOUNTER — Other Ambulatory Visit: Payer: Self-pay

## 2016-07-28 ENCOUNTER — Ambulatory Visit (INDEPENDENT_AMBULATORY_CARE_PROVIDER_SITE_OTHER): Payer: Medicare Other | Admitting: Physician Assistant

## 2016-07-28 ENCOUNTER — Other Ambulatory Visit: Payer: Self-pay | Admitting: Physician Assistant

## 2016-07-28 ENCOUNTER — Encounter: Payer: Self-pay | Admitting: Physician Assistant

## 2016-07-28 VITALS — BP 122/60 | HR 71 | Temp 98.1°F | Resp 14 | Ht 63.0 in | Wt 121.2 lb

## 2016-07-28 DIAGNOSIS — F411 Generalized anxiety disorder: Secondary | ICD-10-CM

## 2016-07-28 DIAGNOSIS — E039 Hypothyroidism, unspecified: Secondary | ICD-10-CM

## 2016-07-28 DIAGNOSIS — R350 Frequency of micturition: Secondary | ICD-10-CM

## 2016-07-28 DIAGNOSIS — Z79899 Other long term (current) drug therapy: Secondary | ICD-10-CM

## 2016-07-28 DIAGNOSIS — E871 Hypo-osmolality and hyponatremia: Secondary | ICD-10-CM

## 2016-07-28 DIAGNOSIS — R6884 Jaw pain: Secondary | ICD-10-CM | POA: Diagnosis not present

## 2016-07-28 LAB — CBC WITH DIFFERENTIAL/PLATELET
BASOS ABS: 0 {cells}/uL (ref 0–200)
Basophils Relative: 0 %
Eosinophils Absolute: 73 cells/uL (ref 15–500)
Eosinophils Relative: 1 %
HEMATOCRIT: 33 % — AB (ref 35.0–45.0)
HEMOGLOBIN: 11.1 g/dL — AB (ref 11.7–15.5)
LYMPHS ABS: 1825 {cells}/uL (ref 850–3900)
Lymphocytes Relative: 25 %
MCH: 29.3 pg (ref 27.0–33.0)
MCHC: 33.6 g/dL (ref 32.0–36.0)
MCV: 87.1 fL (ref 80.0–100.0)
MONO ABS: 511 {cells}/uL (ref 200–950)
MPV: 8.9 fL (ref 7.5–12.5)
Monocytes Relative: 7 %
NEUTROS PCT: 67 %
Neutro Abs: 4891 cells/uL (ref 1500–7800)
Platelets: 285 10*3/uL (ref 140–400)
RBC: 3.79 MIL/uL — ABNORMAL LOW (ref 3.80–5.10)
RDW: 14.6 % (ref 11.0–15.0)
WBC: 7.3 10*3/uL (ref 3.8–10.8)

## 2016-07-28 LAB — COMPREHENSIVE METABOLIC PANEL
ALBUMIN: 4.1 g/dL (ref 3.6–5.1)
ALT: 12 U/L (ref 6–29)
AST: 25 U/L (ref 10–35)
Alkaline Phosphatase: 64 U/L (ref 33–130)
BILIRUBIN TOTAL: 0.4 mg/dL (ref 0.2–1.2)
BUN: 21 mg/dL (ref 7–25)
CALCIUM: 9.6 mg/dL (ref 8.6–10.4)
CHLORIDE: 97 mmol/L — AB (ref 98–110)
CO2: 21 mmol/L (ref 20–31)
Creat: 1.11 mg/dL — ABNORMAL HIGH (ref 0.60–0.88)
Glucose, Bld: 105 mg/dL — ABNORMAL HIGH (ref 65–99)
Potassium: 4.4 mmol/L (ref 3.5–5.3)
Sodium: 126 mmol/L — ABNORMAL LOW (ref 135–146)
Total Protein: 6.8 g/dL (ref 6.1–8.1)

## 2016-07-28 MED ORDER — SERTRALINE HCL 50 MG PO TABS: 50.0000 mg | ORAL_TABLET | Freq: Every day | ORAL | 2 refills | Status: DC

## 2016-07-28 NOTE — Patient Instructions (Addendum)
Start on the zoloft, this is an anti anxiety to help you "get a hold of your self" This medication can take 2-4 weeks to get in your system so please take the xanax a whole pill of the 0.25mg  AS NEEDED until this starts to work  Take 1/2 of the elavil/amitiptyline at night    If you keep having the headaches/jaw pain, we may stop the elavil and start a medication for trigeminal neuralgia called baclofen at night    Trigeminal Neuralgia Trigeminal neuralgia is a nerve disorder that causes attacks of severe facial pain. The attacks last from a few seconds to several minutes. They can happen for days, weeks, or months and then go away for months or years. Trigeminal neuralgia is also called tic douloureux. CAUSES This condition is caused by damage to a nerve in the face that is called the trigeminal nerve. An attack can be triggered by:  Talking.  Chewing.  Putting on makeup.  Washing your face.  Shaving your face.  Brushing your teeth.  Touching your face. RISK FACTORS This condition is more likely to develop in:  Women.  People who are 80 years of age or older. SYMPTOMS The main symptom of this condition is pain in the jaw, lips, eyes, nose, scalp, forehead, and face. The pain may be intense, stabbing, electric, or shock-like. DIAGNOSIS This condition is diagnosed with a physical exam. A CT scan or MRI may be done to rule out other conditions that can cause facial pain. TREATMENT This condition may be treated with:  Avoiding the things that trigger your attacks.  Pain medicine.  Surgery. This may be done in severe cases if other medical treatment does not provide relief. HOME CARE INSTRUCTIONS  Take over-the-counter and prescription medicines only as told by your health care provider.  If you wish to get pregnant, talk with your health care provider before you start trying to get pregnant.  Avoid the things that trigger your attacks. It may help to:  Chew on the  unaffected side of your mouth.  Avoid touching your face.  Avoid blasts of hot or cold air. SEEK MEDICAL CARE IF:  Your pain medicine is not helping.  You develop new, unexplained symptoms, such as:  Double vision.  Facial weakness.  Changes in hearing or balance.  You become pregnant. SEEK IMMEDIATE MEDICAL CARE IF:  Your pain is unbearable, and your pain medicine does not help.   This information is not intended to replace advice given to you by your health care provider. Make sure you discuss any questions you have with your health care provider.   Document Released: 10/17/2000 Document Revised: 07/11/2015 Document Reviewed: 02/12/2015 Elsevier Interactive Patient Education [employer]2016 Elsevier Inc.

## 2016-07-28 NOTE — Progress Notes (Addendum)
Subjective:    Patient ID: Shelby Bradley, female    DOB: 1928-06-21, 82 y.o.   MRN: [id number]  HPI 82 y.o. WF presents with right jaw pain. She woke up in the morning with right jaw pain sept 8th, she went to go see Dr. Jeanice Limurham and he states that it was TMJ from xray. She was put on NSAIDS 600mg  every 6 hours x 1 week but had stomach pain/GERD, he increased her elavil to 50mg , and has been taking advil. Had normal EKG, normal vitals, denies weakness, changes in vision/speech, no CP, SOB, N/V.   She was having right sided sharp/lightening pain, occ tingling right temple. Daughter [name] is here with her. She states she has had extra stress with son having bladder cancer, crying during the day, has been taking extra xanax.   Blood pressure 122/60, pulse 71, temperature 98.1 F (36.7 C), resp. rate 14, height 5\' 3"  (1.6 m), weight 121 lb 3.2 oz (55 kg), SpO2 98 %.  Medications Current Outpatient Prescriptions on File Prior to Visit  Medication Sig  . acetic acid-hydrocortisone (VOSOL-HC) otic solution Place 3 drops into the left ear 2 (two) times daily.  Marland Kitchen. ALPRAZolam (XANAX) 0.25 MG tablet TAKE 1 TABLET BY MOUTH EVERY DAY AT BEDTIME OR AS NEEDED  . amitriptyline (ELAVIL) 50 MG tablet TAKE 1 TABLET BY MOUTH AT BEDTIME  . amLODipine (NORVASC) 2.5 MG tablet Take 1 tablet (2.5 mg total) by mouth daily.  Marland Kitchen. BESIVANCE 0.6 % SUSP   . Cholecalciferol (VITAMIN D-3) 5000 UNITS TABS Take 5,000 Units by mouth daily.  . cyanocobalamin (,VITAMIN B-12,) 1000 MCG/ML injection INJECT 0.5 MLS (500 MCG TOTAL) INTO THE MUSCLE EVERY 30 (THIRTY) DAYS.  Marland Kitchen. levothyroxine (SYNTHROID, LEVOTHROID) 125 MCG tablet TAKE 1 TABLET BY MOUTH BEFORE BREAKFAST FOR 5 DAYS THEN 1 AND 1/2 TABLETS ON TUES. THURS. (Patient taking differently: TAKE 1 TABLET BY MOUTH BEFORE BREAKFAST OR AS DIRECTED.)  . meloxicam (MOBIC) 7.5 MG tablet Take one pill twice daily with food as need for pain, can take with tylenol, can not take with aleve,  iburpofen  . Multiple Vitamins-Minerals (PRESERVISION AREDS PO) Take 1 capsule by mouth 2 (two) times daily.  Marland Kitchen. omeprazole (PRILOSEC) 40 MG capsule Take 1 capsule (40 mg total) by mouth daily.   Current Facility-Administered Medications on File Prior to Visit  Medication  . cyanocobalamin ((VITAMIN B-12)) injection 500 mcg    Problem list She has Hypertension; Hyperlipidemia; Anxiety; Anemia; GERD (gastroesophageal reflux disease); Vitamin D deficiency; Other abnormal glucose; Medication management; Hypothyroidism; Macular degeneration; Lower back pain; Encounter for Medicare annual wellness exam; and COPD (chronic obstructive pulmonary disease) (HCC) on her problem list.   Review of Systems  Constitutional: Negative.   Respiratory: Negative.   Cardiovascular: Negative.   Gastrointestinal: Negative.   Genitourinary: Negative.   Musculoskeletal: Negative.   Skin: Negative.   Neurological: Positive for headaches. Negative for dizziness, tremors, seizures, syncope, facial asymmetry, speech difficulty, weakness, light-headedness and numbness.  Hematological: Negative.   Psychiatric/Behavioral: Positive for dysphoric mood. Negative for agitation, confusion, decreased concentration, sleep disturbance and suicidal ideas. The patient is nervous/anxious.        Objective:   Physical Exam General appearance: alert, no distress, WD/WN,  female HEENT: Left eye ptosis, normocephalic, sclerae anicteric, TM pearly on left, right TM white/scarred, nares patent, no discharge or erythema, pharynx normal Oral cavity: MMM, no lesions Neck: supple, no lymphadenopathy, no thyromegaly, no masses Heart: RRR, normal S1, S2, no murmurs Lungs: CTA  bilaterally, no wheezes, rhonchi, or rales Abdomen: +bs, soft, non tender, non distended, no masses, no hepatomegaly, no splenomegaly Musculoskeletal: nontender, no swelling, no obvious deformity, mild- moderate cervical kyphosis Extremities: no edema, no  cyanosis, no clubbing Pulses: 2+ symmetric, upper and lower extremities, normal cap refill Neurological: alert, oriented x 3, CN2-12 intact, strength normal upper extremities and lower extremities, sensation normal throughout, DTRs 2+ throughout, no cerebellar signs, gait normal Psychiatric: normal affect, behavior normal, pleasant      Assessment & Plan:  Right jaw pain - has improved, non tender jaw, no temporal tenderness - check labs Likely TMJ versus trigeminal neuralgia- if not better will try baclofen - CBC with Differential/Platelet - Comprehensive metabolic panel - Sedimentation rate  Urinary frequency - Urinalysis, Routine w reflex microscopic (not at [facility]) - Urine culture   Generalized anxiety disorder - TSH - patient very upset, crying, take xanax PRN, start on zoloft, follow up 1 month  The patient was advised to call immediately if she has any concerning symptoms in the interval. The patient voices understanding of current treatment options and is in agreement with the current care plan.  Addendum: Patient with hyponatremia, no symptoms at this time. Talked with patient and daughter [name] # [id number], will decrease water (has been increasing water last 2 weeks pre daughter), not take zoloft, increase salt, get CXR, and if any symptoms will take her to the ER, will repeat labs Stat Thursday. Added on random urine sodium.  Increase thyroid to 1 pill T,T,S,S and 1/2 rest of the days, take 1 hour before food with water only  Daughter called and stated has not been taking thyroid medication properly last 2 weeks, will do increase dose x 1 week and then go back to 1/2 daily but 1 Sat/Sunday.

## 2016-07-29 ENCOUNTER — Ambulatory Visit (HOSPITAL_COMMUNITY)
Admission: RE | Admit: 2016-07-29 | Discharge: 2016-07-29 | Disposition: A | Discharge status: Home / Self Care | Payer: Medicare Other | Source: Ambulatory Visit | Attending: Physician Assistant | Admitting: Physician Assistant

## 2016-07-29 DIAGNOSIS — M5136 Other intervertebral disc degeneration, lumbar region: Secondary | ICD-10-CM | POA: Diagnosis not present

## 2016-07-29 DIAGNOSIS — J449 Chronic obstructive pulmonary disease, unspecified: Secondary | ICD-10-CM | POA: Insufficient documentation

## 2016-07-29 DIAGNOSIS — R05 Cough: Secondary | ICD-10-CM | POA: Diagnosis not present

## 2016-07-29 DIAGNOSIS — E871 Hypo-osmolality and hyponatremia: Secondary | ICD-10-CM

## 2016-07-29 LAB — SEDIMENTATION RATE: Sed Rate: 5 mm/hr (ref 0–30)

## 2016-07-29 LAB — URINALYSIS, MICROSCOPIC ONLY
Bacteria, UA: NONE SEEN [HPF]
CRYSTALS: NONE SEEN [HPF]
Casts: NONE SEEN [LPF]
RBC / HPF: NONE SEEN RBC/HPF (ref <=2)
Squamous Epithelial / LPF: NONE SEEN [HPF] (ref <=5)
WBC UA: NONE SEEN WBC/HPF (ref <=5)
Yeast: NONE SEEN [HPF]

## 2016-07-29 LAB — TSH: TSH: 25.7 mIU/L — ABNORMAL HIGH

## 2016-07-29 LAB — URINALYSIS, ROUTINE W REFLEX MICROSCOPIC
Bilirubin Urine: NEGATIVE
GLUCOSE, UA: NEGATIVE
Ketones, ur: NEGATIVE
LEUKOCYTES UA: NEGATIVE
Nitrite: NEGATIVE
PH: 6 (ref 5.0–8.0)
Protein, ur: NEGATIVE
SPECIFIC GRAVITY, URINE: 1.009 (ref 1.001–1.035)

## 2016-07-29 LAB — SODIUM, URINE, RANDOM: SODIUM UR: 44 mmol/L (ref 28–272)

## 2016-07-29 NOTE — Addendum Note (Signed)
Addended by: Quentin MullingOLLIER, Nautika Cressey R on: 07/29/2016 07:39 AM   Modules accepted: Orders

## 2016-07-30 LAB — URINE CULTURE

## 2016-07-31 ENCOUNTER — Ambulatory Visit: Payer: Self-pay | Admitting: Physician Assistant

## 2016-07-31 ENCOUNTER — Other Ambulatory Visit: Payer: Medicare Other

## 2016-07-31 DIAGNOSIS — E039 Hypothyroidism, unspecified: Secondary | ICD-10-CM | POA: Diagnosis not present

## 2016-07-31 DIAGNOSIS — E871 Hypo-osmolality and hyponatremia: Secondary | ICD-10-CM | POA: Diagnosis not present

## 2016-07-31 LAB — BASIC METABOLIC PANEL WITH GFR
BUN: 14 mg/dL (ref 7–25)
CALCIUM: 9.2 mg/dL (ref 8.6–10.4)
CHLORIDE: 101 mmol/L (ref 98–110)
CO2: 20 mmol/L (ref 20–31)
Creat: 1.08 mg/dL — ABNORMAL HIGH (ref 0.60–0.88)
GFR, EST NON AFRICAN AMERICAN: 47 mL/min — AB (ref >=60)
GFR, Est African American: 54 mL/min — ABNORMAL LOW (ref >=60)
GLUCOSE: 128 mg/dL — AB (ref 65–99)
POTASSIUM: 4.4 mmol/L (ref 3.5–5.3)
Sodium: 130 mmol/L — ABNORMAL LOW (ref 135–146)

## 2016-07-31 LAB — TSH: TSH: 15.99 mIU/L — ABNORMAL HIGH

## 2016-07-31 LAB — OSMOLALITY, URINE: Osmolality, Ur: 183 mOsm/kg (ref 50–1200)

## 2016-07-31 LAB — OSMOLALITY: Osmolality: 270 mOsm/kg — ABNORMAL LOW (ref 278–305)

## 2016-08-01 LAB — SODIUM, URINE, RANDOM: Sodium, Ur: 31 mmol/L (ref 28–272)

## 2016-08-13 NOTE — Progress Notes (Signed)
[facility] Lucky Cowboy, M.D.        Dyanne Carrel. Steffanie Dunn, P.A.-C       Terri Piedra, P.A.-C  [facility]                [address]                [place], [place]. [address] Telephone [phone] Telefax [phone] ______________________________________________________________________     This very nice 82 y.o.female presents for 6 month follow up with Hypertension, Hyperlipidemia, Pre-Diabetes and Vitamin D Deficiency. Patient has GERD controlled with diet and medications. Patient has Macular Degeneration and is receiving injections to the Rt eye per Dr Ashley Royalty.       Patient had been seen recently on 90.25.2017 and labs found a low serum Na 126and subsequent on 09.25.2017 serum Na 130 with Urine Na 31 suggesting appropriate conservation and it was speculated that the low Na might have been related to NSAID's prescribed by her dentist for TMJD. Patient was advised liberalizing her Na intake. Also, patient's TSH was elevated at 25.70 & 15.99 on those days and her daughter relates that patient had not been taking meds appropriately. Thyroid dosing was increased.      Patient is treated for HTN (1987)  & BP has been controlled at home. Today's BP: 116/76. Patient has had no complaints of any cardiac type chest pain, palpitations, dyspnea/orthopnea/PND, dizziness, claudication, or dependent edema.     Hyperlipidemia is controlled with diet & meds. Patient denies myalgias or other med SE's. Last Lipids were at goal: Lab Results  Component Value Date   CHOL 170 07/10/2016   HDL 45 (L) 07/10/2016   LDLCALC 97 07/10/2016   TRIG 141 07/10/2016   CHOLHDL 3.8 07/10/2016      Also, the patient has history of PreDiabetes in 2016 with A1c 5.7% and has had no symptoms of reactive hypoglycemia, diabetic polys, paresthesias or visual blurring.  Last A1c was at goal: Lab Results  Component Value Date   HGBA1C 5.6 07/10/2016       Patient also is on Thyroid replacement. Further, the patient also has history of Vitamin D Deficiency in Aug 2016 with Vit D "26" and supplements vitamin D without any suspected side-effects. Last vitamin D was still low & not at goal: Lab Results  Component Value Date   VD25OH 39 01/02/2016   Medication Sig  . VOSOL-HCotic solution Place 3 drops into the left ear 2 (two) times daily.  Marland Kitchen amLODipine 2.5 MG tablet Take 1 tablet (2.5 mg total) by mouth daily.  Marland Kitchen BESIVANCE 0.6 % SUSP     Take 5,000 Units by mouth daily.  Marland Kitchen levothyroxine 125 MCG tablet TAKE 1 tab x 4 days & 1/2 tab x 3 days  . meloxicam  7.5 MG tablet Take one pill twice daily with food as need for pain, can take with tylenol, can not take with aleve, iburpofen  .  PRESERVISION AREDS  Take 1 capsule by mouth 2 (two) times daily.  Marland Kitchen ALPRAZolam  0.25 MG tablet TAKE 1 TABLET BY MOUTH EVERY DAY AT BEDTIME   . amitriptyline (ELAVIL) 50 MG tablet TAKE 1 TABLET BY MOUTH AT BEDTIME  . VITAMIN B-12 1000 MCG/ML inj INJECT 1/2 cc IM or SQ  EVERY 30 (THIRTY) DAYS.  Marland Kitchen omeprazole  40 MG capsule Take 1 capsule (40 mg total) by mouth daily.  Marland Kitchen VITAMIN B-12 injection 500 mcg  Allergies  Allergen Reactions  . Amlodipine Swelling    Swelling/edema  . Norvasc [Amlodipine Besylate]   . Sulfa Antibiotics    PMHx:   Past Medical History:  Diagnosis Date  . Allergy   . Anemia   . Anxiety   . DDD (degenerative disc disease), lumbar   . Depression   . GERD (gastroesophageal reflux disease)   . Hyperlipidemia   . Hypertension   . OA (osteoarthritis) of knee    right knee  . Pre-diabetes   . Thyroid disease   . Vitamin D deficiency    Immunization History  Administered Date(s) Administered  . DTaP 05/20/2012  . Influenza Whole 07/21/2013  . Influenza, High Dose Seasonal PF 06/29/2014, 07/30/2015, 07/10/2016  . Pneumococcal Conjugate-13 05/29/2014  . Pneumococcal Polysaccharide-23 05/07/2010, 12/05/2014  . Zoster  11/03/2006   Past Surgical History:  Procedure Laterality Date  . CATARACT EXTRACTION     both  . KNEE ARTHROSCOPY Left    FHx:    Reviewed / unchanged  SHx:    Reviewed / unchanged  Systems Review:  Constitutional: Denies fever, chills, wt changes, headaches, insomnia, fatigue, night sweats, change in appetite. Eyes: Denies redness, blurred vision, diplopia, discharge, itchy, watery eyes.  ENT: Denies discharge, congestion, post nasal drip, epistaxis, sore throat, earache, hearing loss, dental pain, tinnitus, vertigo, sinus pain, snoring.  CV: Denies chest pain, palpitations, irregular heartbeat, syncope, dyspnea, diaphoresis, orthopnea, PND, claudication or edema. Respiratory: denies cough, dyspnea, DOE, pleurisy, hoarseness, laryngitis, wheezing.  Gastrointestinal: Denies dysphagia, odynophagia, heartburn, reflux, water brash, abdominal pain or cramps, nausea, vomiting, bloating, diarrhea, constipation, hematemesis, melena, hematochezia  or hemorrhoids. Genitourinary: Denies dysuria, frequency, urgency, nocturia, hesitancy, discharge, hematuria or flank pain. Musculoskeletal: Denies arthralgias, myalgias, stiffness, jt. swelling, pain, limping or strain/sprain.  Skin: Denies pruritus, rash, hives, warts, acne, eczema or change in skin lesion(s). Neuro: No weakness, tremor, incoordination, spasms, paresthesia or pain. Psychiatric: Denies confusion, memory loss or sensory loss. Endo: Denies change in weight, skin or hair change.  Heme/Lymph: No excessive bleeding, bruising or enlarged lymph nodes.  Physical Exam BP 116/76   Pulse 84   Temp 97.3 F (36.3 C)   Resp 16   Ht 5\' 3"  (1.6 m)   Wt 123 lb 3.2 oz (55.9 kg)   BMI 21.82 kg/m   Appears well nourished and in no distress.  Eyes: PERRLA, EOMs, conjunctiva no swelling or erythema. Sinuses: No frontal/maxillary tenderness ENT/Mouth: EAC's clear, TM's nl w/o erythema, bulging. Nares clear w/o erythema, swelling, exudates.  Oropharynx clear without erythema or exudates. Oral hygiene is good. Tongue normal, non obstructing. Hearing intact.  Neck: Supple. Thyroid nl. Car 2+/2+ without bruits, nodes or JVD. Chest: Respirations nl with BS clear & equal w/o rales, rhonchi, wheezing or stridor.  Cor: Heart sounds normal w/ regular rate and rhythm without sig. murmurs, gallops, clicks, or rubs. Peripheral pulses normal and equal  without edema.  Abdomen: Soft & bowel sounds normal. Non-tender w/o guarding, rebound, hernias, masses, or organomegaly.  Lymphatics: Unremarkable.  Musculoskeletal: Full ROM all peripheral extremities, joint stability, 5/5 strength, and normal gait.  Skin: Warm, dry without exposed rashes, lesions or ecchymosis apparent.  Neuro: Cranial nerves intact, reflexes equal bilaterally. Sensory-motor testing grossly intact. Tendon reflexes grossly intact.  Pysch: Alert & oriented x 3.  Insight and judgement nl & appropriate. No ideations.  Assessment and Plan:  1. Essential hypertension  - Continue medication, monitor blood pressure at home. Continue DASH diet. Reminder to go to the ER  if any CP, SOB, nausea, dizziness, severe HA, changes vision/speech, left arm numbness and tingling and jaw pain.  2. Mixed hyperlipidemia  - Continue diet/meds, exercise,& lifestyle modifications. Continue monitor periodic cholesterol/liver & renal functions   3. Pre-diabetes  - Continue diet, exercise, lifestyle modifications. Monitor appropriate labs.  4. Vitamin D deficiency  - Continue supplementation. - VITAMIN D 25 Hydroxy  5. Other abnormal glucose   6. Gastroesophageal reflux disease   7. Acquired hypothyroidism  - TSH  8. Hyponatremia   9. Medication management  - CBC with Differential/Platelet - BASIC METABOLIC PANEL WITH GFR - Magnesium       Recommended regular exercise, BP monitoring, weight control, and discussed med and SE's. Recommended labs to assess and monitor clinical  status. Further disposition pending results of labs. Over 30 minutes of exam, counseling, chart review was performed

## 2016-08-13 NOTE — Patient Instructions (Signed)

## 2016-08-14 ENCOUNTER — Ambulatory Visit (INDEPENDENT_AMBULATORY_CARE_PROVIDER_SITE_OTHER): Payer: Medicare Other | Admitting: Internal Medicine

## 2016-08-14 ENCOUNTER — Other Ambulatory Visit: Payer: Self-pay | Admitting: *Deleted

## 2016-08-14 ENCOUNTER — Encounter: Payer: Self-pay | Admitting: Internal Medicine

## 2016-08-14 VITALS — BP 116/76 | HR 84 | Temp 97.3°F | Resp 16 | Ht 63.0 in | Wt 123.2 lb

## 2016-08-14 DIAGNOSIS — E559 Vitamin D deficiency, unspecified: Secondary | ICD-10-CM

## 2016-08-14 DIAGNOSIS — E871 Hypo-osmolality and hyponatremia: Secondary | ICD-10-CM

## 2016-08-14 DIAGNOSIS — R7303 Prediabetes: Secondary | ICD-10-CM | POA: Diagnosis not present

## 2016-08-14 DIAGNOSIS — E782 Mixed hyperlipidemia: Secondary | ICD-10-CM

## 2016-08-14 DIAGNOSIS — Z79899 Other long term (current) drug therapy: Secondary | ICD-10-CM

## 2016-08-14 DIAGNOSIS — E039 Hypothyroidism, unspecified: Secondary | ICD-10-CM

## 2016-08-14 DIAGNOSIS — K219 Gastro-esophageal reflux disease without esophagitis: Secondary | ICD-10-CM

## 2016-08-14 DIAGNOSIS — I10 Essential (primary) hypertension: Secondary | ICD-10-CM | POA: Diagnosis not present

## 2016-08-14 DIAGNOSIS — R7309 Other abnormal glucose: Secondary | ICD-10-CM

## 2016-08-14 LAB — BASIC METABOLIC PANEL WITH GFR
BUN: 12 mg/dL (ref 7–25)
CO2: 23 mmol/L (ref 20–31)
Calcium: 8.8 mg/dL (ref 8.6–10.4)
Chloride: 106 mmol/L (ref 98–110)
Creat: 0.84 mg/dL (ref 0.60–0.88)
GFR, EST AFRICAN AMERICAN: 73 mL/min (ref >=60)
GFR, EST NON AFRICAN AMERICAN: 63 mL/min (ref >=60)
Glucose, Bld: 99 mg/dL (ref 65–99)
POTASSIUM: 3.8 mmol/L (ref 3.5–5.3)
Sodium: 136 mmol/L (ref 135–146)

## 2016-08-14 LAB — CBC WITH DIFFERENTIAL/PLATELET
BASOS PCT: 1 %
Basophils Absolute: 41 cells/uL (ref 0–200)
Eosinophils Absolute: 82 cells/uL (ref 15–500)
Eosinophils Relative: 2 %
HEMATOCRIT: 29 % — AB (ref 35.0–45.0)
Hemoglobin: 9.5 g/dL — ABNORMAL LOW (ref 11.7–15.5)
LYMPHS PCT: 29 %
Lymphs Abs: 1189 cells/uL (ref 850–3900)
MCH: 29.1 pg (ref 27.0–33.0)
MCHC: 32.8 g/dL (ref 32.0–36.0)
MCV: 89 fL (ref 80.0–100.0)
MONO ABS: 369 {cells}/uL (ref 200–950)
MONOS PCT: 9 %
MPV: 8.7 fL (ref 7.5–12.5)
NEUTROS ABS: 2419 {cells}/uL (ref 1500–7800)
Neutrophils Relative %: 59 %
PLATELETS: 178 10*3/uL (ref 140–400)
RBC: 3.26 MIL/uL — AB (ref 3.80–5.10)
RDW: 14.4 % (ref 11.0–15.0)
WBC: 4.1 10*3/uL (ref 3.8–10.8)

## 2016-08-14 LAB — TSH: TSH: 0.66 m[IU]/L

## 2016-08-14 LAB — MAGNESIUM: MAGNESIUM: 1.8 mg/dL (ref 1.5–2.5)

## 2016-08-14 MED ORDER — ALPRAZOLAM 0.25 MG PO TABS: ORAL_TABLET | ORAL | 1 refills | Status: DC

## 2016-08-14 MED ORDER — AMITRIPTYLINE HCL 50 MG PO TABS: 50.0000 mg | ORAL_TABLET | Freq: Every day | ORAL | 1 refills | Status: DC

## 2016-08-14 MED ORDER — OMEPRAZOLE 40 MG PO CPDR: 40.0000 mg | DELAYED_RELEASE_CAPSULE | Freq: Every day | ORAL | 3 refills | Status: DC

## 2016-08-14 MED ORDER — CYANOCOBALAMIN 1000 MCG/ML IJ SOLN: INTRAMUSCULAR | 3 refills | Status: DC

## 2016-08-15 LAB — VITAMIN D 25 HYDROXY (VIT D DEFICIENCY, FRACTURES): Vit D, 25-Hydroxy: 31 ng/mL (ref 30–100)

## 2016-08-18 ENCOUNTER — Other Ambulatory Visit: Payer: Self-pay | Admitting: *Deleted

## 2016-08-18 ENCOUNTER — Encounter (INDEPENDENT_AMBULATORY_CARE_PROVIDER_SITE_OTHER): Payer: Medicare Other | Admitting: Ophthalmology

## 2016-08-18 ENCOUNTER — Telehealth: Payer: Self-pay | Admitting: *Deleted

## 2016-08-18 MED ORDER — ALPRAZOLAM 0.5 MG PO TABS: ORAL_TABLET | ORAL | 0 refills | Status: DC

## 2016-08-18 NOTE — Telephone Encounter (Signed)
Patient called and states she got Alprazolam 0.5 mg instead of 0.25 mg at her last refill.  Per Dr Oneta RackMcKeown, it is OK to take 1/2 tablet at bedtime.

## 2016-08-25 ENCOUNTER — Ambulatory Visit: Payer: Medicare Other

## 2016-08-25 VITALS — BP 136/74 | Wt 123.6 lb

## 2016-08-25 DIAGNOSIS — E538 Deficiency of other specified B group vitamins: Secondary | ICD-10-CM

## 2016-08-25 MED ORDER — CYANOCOBALAMIN 1000 MCG/ML IJ SOLN: 1000.0000 ug | INTRAMUSCULAR | 0 refills | Status: DC

## 2016-08-25 NOTE — Progress Notes (Signed)
Pt presents for B12 injection which was supplied by the pt Given in LD IM .5

## 2016-08-26 ENCOUNTER — Ambulatory Visit: Payer: Self-pay

## 2016-08-28 ENCOUNTER — Encounter (INDEPENDENT_AMBULATORY_CARE_PROVIDER_SITE_OTHER): Payer: Medicare Other | Admitting: Ophthalmology

## 2016-08-28 DIAGNOSIS — H34831 Tributary (branch) retinal vein occlusion, right eye, with macular edema: Secondary | ICD-10-CM | POA: Diagnosis not present

## 2016-08-28 DIAGNOSIS — H43813 Vitreous degeneration, bilateral: Secondary | ICD-10-CM | POA: Diagnosis not present

## 2016-08-28 DIAGNOSIS — H35033 Hypertensive retinopathy, bilateral: Secondary | ICD-10-CM | POA: Diagnosis not present

## 2016-08-28 DIAGNOSIS — H353122 Nonexudative age-related macular degeneration, left eye, intermediate dry stage: Secondary | ICD-10-CM

## 2016-08-28 DIAGNOSIS — I10 Essential (primary) hypertension: Secondary | ICD-10-CM | POA: Diagnosis not present

## 2016-09-29 ENCOUNTER — Ambulatory Visit: Payer: Self-pay

## 2016-09-29 ENCOUNTER — Encounter: Payer: Self-pay | Admitting: Internal Medicine

## 2016-09-29 ENCOUNTER — Ambulatory Visit (INDEPENDENT_AMBULATORY_CARE_PROVIDER_SITE_OTHER): Payer: Medicare Other | Admitting: Internal Medicine

## 2016-09-29 VITALS — BP 138/72 | HR 88 | Temp 98.4°F | Resp 14 | Ht 63.0 in | Wt 123.0 lb

## 2016-09-29 DIAGNOSIS — E039 Hypothyroidism, unspecified: Secondary | ICD-10-CM

## 2016-09-29 DIAGNOSIS — E538 Deficiency of other specified B group vitamins: Secondary | ICD-10-CM

## 2016-09-29 DIAGNOSIS — R609 Edema, unspecified: Secondary | ICD-10-CM

## 2016-09-29 LAB — TSH: TSH: 0.45 mIU/L

## 2016-09-29 MED ORDER — CYANOCOBALAMIN 1000 MCG/ML IJ SOLN
500.0000 ug | Freq: Once | INTRAMUSCULAR | Status: AC
  Administered 2016-09-29: 500 ug via INTRAMUSCULAR

## 2016-09-29 MED ORDER — CYANOCOBALAMIN 1000 MCG/ML IJ SOLN: 1000.0000 ug | INTRAMUSCULAR | 3 refills | Status: DC

## 2016-09-29 NOTE — Progress Notes (Signed)
   Subjective:    Patient ID: Shelby Bradley, female    DOB: July 06, 1928, 82 y.o.   MRN: [id number]  HPI  Patient presents to the office for bilateral ankle swelling.  She reports that she recently had a bout of TMJ because the dentist put her on ibuprofen.  She reports that this caused her sodium to go down.  She then increased her sodium and she came back up to normal.  She reports that she is afraid that her kidneys will be a problem.  She reports that she has not been elevating her feet above her heart.  She did have multiple thanksgiving meals.    Review of Systems  Constitutional: Negative for chills, fatigue and fever.  Respiratory: Negative for cough, chest tightness and shortness of breath.   Cardiovascular: Positive for leg swelling. Negative for chest pain and palpitations.       Objective:   Physical Exam  Constitutional: She is oriented to person, place, and time. She appears well-developed and well-nourished. No distress.  HENT:  Head: Normocephalic.  Mouth/Throat: Oropharynx is clear and moist. No oropharyngeal exudate.  Eyes: Conjunctivae are normal. No scleral icterus.  Neck: Normal range of motion. Neck supple. No JVD present. No thyromegaly present.  Cardiovascular: Normal rate, regular rhythm, normal heart sounds and intact distal pulses.  Exam reveals no gallop and no friction rub.   No murmur heard. Trace edema of the bilateral ankles.    Pulmonary/Chest: Effort normal and breath sounds normal. No respiratory distress. She has no wheezes. She has no rales. She exhibits no tenderness.  Abdominal: Soft. Bowel sounds are normal. She exhibits no distension and no mass. There is no tenderness. There is no rebound and no guarding.  Lymphadenopathy:    She has no cervical adenopathy.  Neurological: She is alert and oriented to person, place, and time.  Skin: Skin is warm and dry. She is not diaphoretic.  Psychiatric: She has a normal mood and affect. Her behavior is  normal. Judgment and thought content normal.  Nursing note and vitals reviewed.   Vitals:   09/29/16 1457  BP: 138/72  Pulse: 88  Resp: 14  Temp: 98.4 F (36.9 C)          Assessment & Plan:    1. Vitamin B 12 deficiency -cont B12 - cyanocobalamin ((VITAMIN B-12)) injection 500 mcg; Inject 0.5 mLs (500 mcg total) into the muscle once.  2. B12 deficiency -Cont B12 - cyanocobalamin (,VITAMIN B-12,) 1000 MCG/ML injection; Inject 1 mL (1,000 mcg total) into the skin every 30 (thirty) days.  Dispense: 1 mL; Refill: 3  3. Hypothyroidism, unspecified type -cont levothyroxine - TSH  4. Peripheral edema -start wearing compression socks -limit salt intake -elevate feet above the heart -discussed taking lasix 20 mg once daily as needed for leg swelling but patient is recalcitrant about starting it.  She reports that she would rather try conservative treatments instead.

## 2016-09-29 NOTE — Patient Instructions (Signed)
Please buy either Dr. Luiz OchoaSchoals or Orvilla Fusommy Copper compression socks.  You can find these at [facility], [facility], [facility] and [facility] generally in the foot care section.  Wear these during the day if you are going to be up on your feet or sitting in a regular chair.  When you are not on your feet or have them elevated above your heart then you can remove them.  Do not wear them at nighttime.  Please try to avoid salt as much as possible.  Please call the office if the swelling of the legs does not improve and we will start a very low dose of a fluid pill.

## 2016-10-07 ENCOUNTER — Other Ambulatory Visit: Payer: Self-pay | Admitting: *Deleted

## 2016-10-07 MED ORDER — AMLODIPINE BESYLATE 2.5 MG PO TABS: 2.5000 mg | ORAL_TABLET | Freq: Every day | ORAL | 2 refills | Status: DC

## 2016-10-07 MED ORDER — LEVOTHYROXINE SODIUM 125 MCG PO TABS: ORAL_TABLET | ORAL | 2 refills | Status: DC

## 2016-10-29 ENCOUNTER — Ambulatory Visit: Payer: Medicare Other

## 2016-10-29 DIAGNOSIS — E538 Deficiency of other specified B group vitamins: Secondary | ICD-10-CM

## 2016-10-29 MED ORDER — CYANOCOBALAMIN 1000 MCG/ML IJ SOLN: 1000.0000 ug | INTRAMUSCULAR | 0 refills | Status: DC

## 2016-10-29 NOTE — Progress Notes (Signed)
Pt presents for B12 injection which was supplied by the pt Given in RD IM 0.5ML

## 2016-11-18 ENCOUNTER — Ambulatory Visit (INDEPENDENT_AMBULATORY_CARE_PROVIDER_SITE_OTHER): Payer: Medicare Other | Admitting: Physician Assistant

## 2016-11-18 ENCOUNTER — Encounter: Payer: Self-pay | Admitting: Physician Assistant

## 2016-11-18 VITALS — BP 136/70 | HR 100 | Temp 97.7°F | Resp 16 | Ht 63.0 in | Wt 118.4 lb

## 2016-11-18 DIAGNOSIS — E782 Mixed hyperlipidemia: Secondary | ICD-10-CM | POA: Diagnosis not present

## 2016-11-18 DIAGNOSIS — I10 Essential (primary) hypertension: Secondary | ICD-10-CM | POA: Diagnosis not present

## 2016-11-18 DIAGNOSIS — J449 Chronic obstructive pulmonary disease, unspecified: Secondary | ICD-10-CM | POA: Diagnosis not present

## 2016-11-18 DIAGNOSIS — R350 Frequency of micturition: Secondary | ICD-10-CM

## 2016-11-18 DIAGNOSIS — E441 Mild protein-calorie malnutrition: Secondary | ICD-10-CM | POA: Diagnosis not present

## 2016-11-18 DIAGNOSIS — Z79899 Other long term (current) drug therapy: Secondary | ICD-10-CM

## 2016-11-18 DIAGNOSIS — E039 Hypothyroidism, unspecified: Secondary | ICD-10-CM

## 2016-11-18 DIAGNOSIS — E43 Unspecified severe protein-calorie malnutrition: Secondary | ICD-10-CM | POA: Insufficient documentation

## 2016-11-18 DIAGNOSIS — R634 Abnormal weight loss: Secondary | ICD-10-CM

## 2016-11-18 LAB — CBC WITH DIFFERENTIAL/PLATELET
Basophils Absolute: 0 cells/uL (ref 0–200)
Basophils Relative: 0 %
Eosinophils Absolute: 0 cells/uL — ABNORMAL LOW (ref 15–500)
Eosinophils Relative: 0 %
HEMATOCRIT: 30.6 % — AB (ref 35.0–45.0)
HEMOGLOBIN: 10 g/dL — AB (ref 11.7–15.5)
LYMPHS ABS: 1680 {cells}/uL (ref 850–3900)
Lymphocytes Relative: 24 %
MCH: 29.3 pg (ref 27.0–33.0)
MCHC: 32.7 g/dL (ref 32.0–36.0)
MCV: 89.7 fL (ref 80.0–100.0)
MONO ABS: 490 {cells}/uL (ref 200–950)
MPV: 8.3 fL (ref 7.5–12.5)
Monocytes Relative: 7 %
NEUTROS ABS: 4830 {cells}/uL (ref 1500–7800)
NEUTROS PCT: 69 %
Platelets: 370 10*3/uL (ref 140–400)
RBC: 3.41 MIL/uL — ABNORMAL LOW (ref 3.80–5.10)
RDW: 14 % (ref 11.0–15.0)
WBC: 7 10*3/uL (ref 3.8–10.8)

## 2016-11-18 LAB — TSH: TSH: 0.41 mIU/L

## 2016-11-18 MED ORDER — CYANOCOBALAMIN 1000 MCG/ML IJ SOLN: 1000.0000 ug | INTRAMUSCULAR | 0 refills | Status: DC

## 2016-11-18 NOTE — Patient Instructions (Signed)
Drink one ensure/boost a day Increase protein Eat small frequent meals  Failure to Thrive, Adult Introduction Failure to thrive is a group of symptoms that affect elderly adults. These symptoms include loss of appetite and weight loss. People who have this condition may do fewer and fewer activities over time. They may lose interest in being with friends or they may not want to eat or drink. This condition is not a normal part of aging. What are the causes? This condition may be caused by:  A disease, such dementia, diabetes, cancer, or lung disease.  A health problem, such as a vitamin deficiency or a heart problem.  A disorder, such as depression.  A disability.  Medicines.  Mistreatment or neglect. In some cases, the cause may not be known. What are the signs or symptoms? Symptoms of this condition include:  Loss of more than 5% of your body weight.  Being more tired than normal after an activity.  Having trouble getting up after sitting.  Loss of appetite.  Not getting out of bed.  Not wanting to do usual activities.  Depression.  Getting infections often.  Bedsores.  Taking a long time to recover after an injury or a surgery.  Weakness. How is this diagnosed? This condition may be diagnosed with a physical exam. Your health care provider will ask questions about your health, behavior, and mood, such as:  Has your activity changed?  Do you seem sad?  Are your eating habits different? Tests may also be done. They may include:  Blood tests.  Urine tests.  Imaging tests, such as X-rays, a CT scan, or MRI.  Hearing tests.  Vision tests.  Tests to check thinking ability (cognitive tests).  Activity tests to see if you can do tasks such as bathing and dressing and to see if you can move around safely. You may be referred to a specialist. How is this treated? Treatment for this condition depends on the cause. It may involve:  Treating the  cause.  Talk therapy or medicine to treat depression.  Improving diet, such as by eating more often or taking nutritional supplements.  Changing or stopping a medicine.  Physical therapy. It often takes a team of health care providers to find the right treatment. Follow these instructions at home:  Take over-the-counter and prescription medicines only as told by your health care provider.  Eat a healthy, well-balanced diet. Make sure to get enough calories in each meal.  Be physically active. Include strength training as part of your exercise routine. A physical therapist can help to set up an exercise program that fits you.  Make sure that you are safe at home.  Make sure that you have a plan for what to do if you become unable to make decisions for yourself. Contact a health care provider if:  You are not able to eat well.  You are not able to move around.  You feel very sad or hopeless. Get help right away if:  You have thoughts of ending your life.  You cannot eat or drink.  You do not get out of bed.  Staying at home is no longer safe.  You have a fever. This information is not intended to replace advice given to you by your health care provider. Make sure you discuss any questions you have with your health care provider. Document Released: 01/12/2012 Document Revised: 03/27/2016 Document Reviewed: 01/15/2015  2017 Elsevier

## 2016-11-18 NOTE — Progress Notes (Signed)
Assessment and Plan:  Hypertension -Continue medication, monitor blood pressure at home. Continue DASH diet.  Reminder to go to the ER if any CP, SOB, nausea, dizziness, severe HA, changes vision/speech, left arm numbness and tingling and jaw pain.   Cholesterol -Continue diet and exercise. Check cholesterol.   Vitamin D Def - check level and continue medications.   Hypothyroidism -check TSH level, continue medications the same, reminded to take on an empty stomach 30-95mns before food.   Loss of weight Mild malnutrition.  Has had normal CXR, normal colonoscopy 2010, normal MGM 2004 but normal breast exam Will get labs, CRP/Sed rate, may need to get CT chest/AB ? Need to cut back on thyroid medication Get on ensure/boost daily, increase protein No HA/temporal pain, no joint pain, no SOB, CP, no AB issues, no urinary issues, normal appetite, stress better with son doing better after chemo/surgery.  -     C-reactive protein -     Sedimentation rate  Urinary frequency -     Urinalysis, Routine w reflex microscopic -     Urine culture   Continue diet and meds as discussed. Further disposition pending results of labs. Over 30 minutes of exam, counseling, chart review, and critical decision making was performed  Future Appointments Date Time Provider [place] 11/18/2016 2:30 PM AVicie Mutters PA-C [facility] None  12/01/2016 1:00 PM JHayden Pedro MD [facility] None  02/23/2017 2:30 PM WUnk Pinto MD [facility] None  07/29/2017 2:00 PM AVicie Mutters PA-C [facility] None     HPI 83y.o. female  presents for 3 month follow up on hypertension, cholesterol, prediabetes, and vitamin D deficiency.   Her blood pressure has been controlled at home, today their BP is BP: 136/70  She does workout. She denies chest pain, shortness of breath, dizziness.  She is not on cholesterol medication. Her cholesterol is at goal. The cholesterol last visit was:   Lab Results  Component  Value Date   CHOL 170 07/10/2016   HDL 45 (L) 07/10/2016   LDLCALC 97 07/10/2016   TRIG 141 07/10/2016   CHOLHDL 3.8 07/10/2016   She has been working on diet and exercise for prediabetes, and denies paresthesia of the feet, polydipsia, polyuria and visual disturbances. Last A1C in the office was:  Lab Results  Component Value Date   HGBA1C 5.6 07/10/2016  Patient is on Vitamin D supplement.   Lab Results  Component Value Date   VD25OH 31 08/14/2016   She had her 5th shot for mac degen in her right eye with Dr. MRodman Keyand states that it is helping, seeing once a month. She is on thyroid medication, she is on 1 pill T, T, S, S and 1/2 on M, W, F.  Her medication was changed last visit.   Lab Results  Component Value Date   TSH 0.45 09/29/2016  .  BMI is Body mass index is 20.97 kg/m. She is concerned about her weight loss, she continues to lose weight, she was busy over Christmas, she has not been doing ensure/boost, appetite is good. She had normal colonoscopy in 2010, last MGM 2009, declines another, had normal CXR 07/2016. She denies headache, trouble swallowing, SOB, cough, wheezing, GERD, diarrhea/constipation, joint pain. Only has back pain.  Wt Readings from Last 3 Encounters:  11/18/16 118 lb 6.4 oz (53.7 kg)  09/29/16 123 lb (55.8 kg)  08/25/16 123 lb 9.6 oz (56.1 kg)    Current Medications:  Current Outpatient Prescriptions on File Prior  to Visit  Medication Sig Dispense Refill  . acetic acid-hydrocortisone (VOSOL-HC) otic solution Place 3 drops into the left ear 2 (two) times daily. 10 mL 1  . ALPRAZolam (XANAX) 0.5 MG tablet Take 1/2 tablet at bedtime. 60 tablet 0  . amitriptyline (ELAVIL) 50 MG tablet Take 1 tablet (50 mg total) by mouth at bedtime. 90 tablet 1  . amLODipine (NORVASC) 2.5 MG tablet Take 1 tablet (2.5 mg total) by mouth daily. 90 tablet 2  . BESIVANCE 0.6 % SUSP   0  . Cholecalciferol (VITAMIN D-3) 5000 UNITS TABS Take 5,000 Units by mouth daily.     . cyanocobalamin (,VITAMIN B-12,) 1000 MCG/ML injection Inject 1 mL (1,000 mcg total) into the skin every 30 (thirty) days. 1 mL 3  . cyanocobalamin (,VITAMIN B-12,) 1000 MCG/ML injection Inject 1 mL (1,000 mcg total) into the skin every 30 (thirty) days. 1 mL 0  . ibuprofen (ADVIL,MOTRIN) 600 MG tablet Take 600 mg by mouth every 6 (six) hours.  1  . levothyroxine (SYNTHROID, LEVOTHROID) 125 MCG tablet TAKE 1 TABLET BY MOUTH BEFORE BREAKFAST OR AS DIRECTED. 90 tablet 2  . meloxicam (MOBIC) 7.5 MG tablet Take one pill twice daily with food as need for pain, can take with tylenol, can not take with aleve, iburpofen 60 tablet 3  . Multiple Vitamins-Minerals (PRESERVISION AREDS PO) Take 1 capsule by mouth 2 (two) times daily.    Marland Kitchen omeprazole (PRILOSEC) 40 MG capsule Take 1 capsule (40 mg total) by mouth daily. 90 capsule 3   No current facility-administered medications on file prior to visit.    Medical History:  Past Medical History:  Diagnosis Date  . Allergy   . Anemia   . Anxiety   . DDD (degenerative disc disease), lumbar   . Depression   . GERD (gastroesophageal reflux disease)   . Hyperlipidemia   . Hypertension   . OA (osteoarthritis) of knee    right knee  . Pre-diabetes   . Thyroid disease   . Vitamin D deficiency    Allergies:  Allergies  Allergen Reactions  . Amlodipine Swelling    Swelling/edema  . Norvasc [Amlodipine Besylate]   . Sulfa Antibiotics      Review of Systems:  Review of Systems  Constitutional: Positive for weight loss. Negative for chills, diaphoresis, fever and malaise/fatigue.  HENT: Negative.   Eyes: Negative.   Respiratory: Negative.  Negative for cough and shortness of breath.   Cardiovascular: Negative.  Negative for chest pain and palpitations.  Gastrointestinal: Negative.  Negative for abdominal pain and diarrhea.  Genitourinary: Negative.   Musculoskeletal: Negative.   Skin: Negative.   Neurological: Negative.  Negative for weakness.   Endo/Heme/Allergies: Negative.   Psychiatric/Behavioral: Negative.  Negative for depression.    Family history- Review and unchanged Social history- Review and unchanged Physical Exam: BP 136/70   Pulse 100   Temp 97.7 F (36.5 C)   Resp 16   Ht _0  (1.6 m)   Wt 118 lb 6.4 oz (53.7 kg)   SpO2 96%   BMI 20.97 kg/m  Wt Readings from Last 3 Encounters:  11/18/16 118 lb 6.4 oz (53.7 kg)  09/29/16 123 lb (55.8 kg)  08/25/16 123 lb 9.6 oz (56.1 kg)   General appearance: alert, no distress, WD/WN,  female HEENT: Left eye ptosis, normocephalic, sclerae anicteric, TM pearly on left, right TM white/scarred, nares patent, no discharge or erythema, pharynx normal Oral cavity: MMM, no lesions Neck: supple, no lymphadenopathy,  no thyromegaly, no masses Heart: RRR, normal S1, S2, no murmurs Lungs: CTA bilaterally, no wheezes, rhonchi, or rales Abdomen: +bs, soft, non tender, non distended, no masses, no hepatomegaly, no splenomegaly Musculoskeletal: nontender, no swelling, no obvious deformity, mild- moderate cervical kyphosis Extremities: no edema, no cyanosis, no clubbing Pulses: 2+ symmetric, upper and lower extremities, normal cap refill Neurological: alert, oriented x 3, CN2-12 intact, strength normal upper extremities and lower extremities for age, sensation normal throughout, DTRs 2+ throughout, no cerebellar signs, gait normal Psychiatric: normal affect, behavior normal, pleasant    Vicie Mutters, PA-C 2:03 PM [facility]

## 2016-11-19 LAB — LIPID PANEL
CHOL/HDL RATIO: 3.6 ratio (ref <5.0)
Cholesterol: 160 mg/dL (ref <200)
HDL: 45 mg/dL — ABNORMAL LOW (ref >50)
LDL CALC: 78 mg/dL (ref <100)
Triglycerides: 184 mg/dL — ABNORMAL HIGH (ref <150)
VLDL: 37 mg/dL — AB (ref <30)

## 2016-11-19 LAB — URINALYSIS, ROUTINE W REFLEX MICROSCOPIC
BILIRUBIN URINE: NEGATIVE
GLUCOSE, UA: NEGATIVE
Hgb urine dipstick: NEGATIVE
Ketones, ur: NEGATIVE
Leukocytes, UA: NEGATIVE
Nitrite: NEGATIVE
PH: 6 (ref 5.0–8.0)
PROTEIN: NEGATIVE
Specific Gravity, Urine: 1.01 (ref 1.001–1.035)

## 2016-11-19 LAB — BASIC METABOLIC PANEL WITH GFR
BUN: 19 mg/dL (ref 7–25)
CALCIUM: 9.2 mg/dL (ref 8.6–10.4)
CHLORIDE: 105 mmol/L (ref 98–110)
CO2: 21 mmol/L (ref 20–31)
Creat: 0.92 mg/dL — ABNORMAL HIGH (ref 0.60–0.88)
GFR, Est African American: 65 mL/min (ref >=60)
GFR, Est Non African American: 57 mL/min — ABNORMAL LOW (ref >=60)
GLUCOSE: 117 mg/dL — AB (ref 65–99)
POTASSIUM: 4.1 mmol/L (ref 3.5–5.3)
Sodium: 138 mmol/L (ref 135–146)

## 2016-11-19 LAB — SEDIMENTATION RATE: Sed Rate: 10 mm/hr (ref 0–30)

## 2016-11-19 LAB — HEPATIC FUNCTION PANEL
ALK PHOS: 70 U/L (ref 33–130)
ALT: 8 U/L (ref 6–29)
AST: 20 U/L (ref 10–35)
Albumin: 3.5 g/dL — ABNORMAL LOW (ref 3.6–5.1)
BILIRUBIN INDIRECT: 0.2 mg/dL (ref 0.2–1.2)
Bilirubin, Direct: 0.1 mg/dL (ref <=0.2)
TOTAL PROTEIN: 6.1 g/dL (ref 6.1–8.1)
Total Bilirubin: 0.3 mg/dL (ref 0.2–1.2)

## 2016-11-19 LAB — MAGNESIUM: MAGNESIUM: 1.8 mg/dL (ref 1.5–2.5)

## 2016-11-19 LAB — URINE CULTURE: Organism ID, Bacteria: NO GROWTH

## 2016-11-19 LAB — C-REACTIVE PROTEIN: CRP: 0.8 mg/L (ref <8.0)

## 2016-11-21 NOTE — Progress Notes (Signed)
Pt aware of lab results & voiced understanding of those results. Pt states daughter will pick up lab letter w/ instructions Message was sent to front office for: Recheck 1 month

## 2016-12-01 ENCOUNTER — Encounter (INDEPENDENT_AMBULATORY_CARE_PROVIDER_SITE_OTHER): Payer: Medicare Other | Admitting: Ophthalmology

## 2016-12-01 DIAGNOSIS — H353111 Nonexudative age-related macular degeneration, right eye, early dry stage: Secondary | ICD-10-CM

## 2016-12-01 DIAGNOSIS — H43813 Vitreous degeneration, bilateral: Secondary | ICD-10-CM

## 2016-12-01 DIAGNOSIS — I10 Essential (primary) hypertension: Secondary | ICD-10-CM

## 2016-12-01 DIAGNOSIS — H353122 Nonexudative age-related macular degeneration, left eye, intermediate dry stage: Secondary | ICD-10-CM

## 2016-12-01 DIAGNOSIS — H34831 Tributary (branch) retinal vein occlusion, right eye, with macular edema: Secondary | ICD-10-CM

## 2016-12-01 DIAGNOSIS — H35033 Hypertensive retinopathy, bilateral: Secondary | ICD-10-CM | POA: Diagnosis not present

## 2016-12-22 ENCOUNTER — Ambulatory Visit (INDEPENDENT_AMBULATORY_CARE_PROVIDER_SITE_OTHER): Payer: Medicare Other | Admitting: *Deleted

## 2016-12-22 DIAGNOSIS — E539 Vitamin B deficiency, unspecified: Secondary | ICD-10-CM | POA: Diagnosis not present

## 2016-12-22 DIAGNOSIS — H6122 Impacted cerumen, left ear: Secondary | ICD-10-CM | POA: Diagnosis not present

## 2016-12-22 DIAGNOSIS — H9202 Otalgia, left ear: Secondary | ICD-10-CM | POA: Diagnosis not present

## 2016-12-22 MED ORDER — CYANOCOBALAMIN 1000 MCG/ML IJ SOLN
500.0000 ug | Freq: Once | INTRAMUSCULAR | Status: AC
Start: 2016-12-22 — End: 2016-12-22
  Administered 2016-12-22: 500 ug via INTRAMUSCULAR

## 2017-01-08 ENCOUNTER — Encounter: Payer: Self-pay | Admitting: Internal Medicine

## 2017-01-19 ENCOUNTER — Ambulatory Visit (INDEPENDENT_AMBULATORY_CARE_PROVIDER_SITE_OTHER): Payer: Medicare Other

## 2017-01-19 VITALS — BP 130/66 | Ht 63.0 in | Wt 121.6 lb

## 2017-01-19 DIAGNOSIS — E538 Deficiency of other specified B group vitamins: Secondary | ICD-10-CM | POA: Diagnosis not present

## 2017-01-19 MED ORDER — CYANOCOBALAMIN 1000 MCG/ML IJ SOLN
1000.0000 ug | Freq: Once | INTRAMUSCULAR | Status: AC
  Administered 2017-01-19: 1000 ug via INTRAMUSCULAR

## 2017-01-19 NOTE — Progress Notes (Signed)
Pt presents for B12 injection which was supplied by the pt Given in RD IM 0.5ML 

## 2017-02-03 ENCOUNTER — Other Ambulatory Visit: Payer: Self-pay | Admitting: Internal Medicine

## 2017-02-23 ENCOUNTER — Encounter (INDEPENDENT_AMBULATORY_CARE_PROVIDER_SITE_OTHER): Payer: Medicare Other | Admitting: Ophthalmology

## 2017-02-23 ENCOUNTER — Ambulatory Visit: Payer: Self-pay | Admitting: Internal Medicine

## 2017-02-23 DIAGNOSIS — H34831 Tributary (branch) retinal vein occlusion, right eye, with macular edema: Secondary | ICD-10-CM

## 2017-02-23 DIAGNOSIS — H35033 Hypertensive retinopathy, bilateral: Secondary | ICD-10-CM | POA: Diagnosis not present

## 2017-02-23 DIAGNOSIS — I10 Essential (primary) hypertension: Secondary | ICD-10-CM

## 2017-02-23 DIAGNOSIS — H43813 Vitreous degeneration, bilateral: Secondary | ICD-10-CM

## 2017-02-23 DIAGNOSIS — H353122 Nonexudative age-related macular degeneration, left eye, intermediate dry stage: Secondary | ICD-10-CM | POA: Diagnosis not present

## 2017-03-02 ENCOUNTER — Encounter: Payer: Self-pay | Admitting: Internal Medicine

## 2017-03-02 ENCOUNTER — Encounter (INDEPENDENT_AMBULATORY_CARE_PROVIDER_SITE_OTHER): Payer: Medicare Other | Admitting: Ophthalmology

## 2017-03-02 ENCOUNTER — Ambulatory Visit (INDEPENDENT_AMBULATORY_CARE_PROVIDER_SITE_OTHER): Payer: Medicare Other | Admitting: Internal Medicine

## 2017-03-02 ENCOUNTER — Encounter: Payer: Self-pay | Admitting: *Deleted

## 2017-03-02 VITALS — BP 138/72 | HR 72 | Temp 97.2°F | Resp 16 | Ht 63.0 in | Wt 119.0 lb

## 2017-03-02 DIAGNOSIS — E039 Hypothyroidism, unspecified: Secondary | ICD-10-CM

## 2017-03-02 DIAGNOSIS — I10 Essential (primary) hypertension: Secondary | ICD-10-CM | POA: Diagnosis not present

## 2017-03-02 DIAGNOSIS — Z79899 Other long term (current) drug therapy: Secondary | ICD-10-CM | POA: Diagnosis not present

## 2017-03-02 DIAGNOSIS — E559 Vitamin D deficiency, unspecified: Secondary | ICD-10-CM

## 2017-03-02 DIAGNOSIS — R7303 Prediabetes: Secondary | ICD-10-CM

## 2017-03-02 DIAGNOSIS — D519 Vitamin B12 deficiency anemia, unspecified: Secondary | ICD-10-CM

## 2017-03-02 DIAGNOSIS — E782 Mixed hyperlipidemia: Secondary | ICD-10-CM

## 2017-03-02 DIAGNOSIS — D649 Anemia, unspecified: Secondary | ICD-10-CM

## 2017-03-02 MED ORDER — CYANOCOBALAMIN 1000 MCG/ML IJ SOLN: 1000.0000 ug | INTRAMUSCULAR | 0 refills | Status: DC

## 2017-03-02 MED ORDER — CYANOCOBALAMIN 1000 MCG/ML IJ SOLN: 1000.0000 ug | Freq: Once | INTRAMUSCULAR | 0 refills | Status: DC

## 2017-03-02 NOTE — Patient Instructions (Signed)

## 2017-03-02 NOTE — Progress Notes (Signed)
Patient here for an OV and received her B2 injection 1000 mcg per ml 0.5 ml in left upper arm.

## 2017-03-02 NOTE — Progress Notes (Signed)
This very nice 83 y.o. [name] presents for 3 month follow up with Hypertension, Hyperlipidemia, Pre-Diabetes and Vitamin D Deficiency. GERD controlled with Meds and prudent diet. Patient receives Rt eye injections fr Dr Ashley Royalty.      Patient is treated for HTN (1987) & BP has been controlled at home. Today's BP is at goal -  138/72. Patient has had no complaints of any cardiac type chest pain, palpitations, dyspnea/orthopnea/PND, dizziness, claudication, or dependent edema.     Hyperlipidemia is controlled with diet & meds. Patient denies myalgias or other med SE's. Last Lipids were at goal albeit sl elevated Trig's:  Lab Results  Component Value Date   CHOL 160 11/18/2016   HDL 45 (L) 11/18/2016   LDLCALC 78 11/18/2016   TRIG 184 (H) 11/18/2016   CHOLHDL 3.6 11/18/2016      Also, the patient has history of PreDiabetes (A1c 5.7% in 2016) and has had no symptoms of reactive hypoglycemia, diabetic polys, paresthesias or visual blurring.  Last A1c was at goal: Lab Results  Component Value Date   HGBA1C 5.6 07/10/2016      Patient also is on thyroid replacement tith last TSH in Jan WNL. Further, the patient also has history of Vitamin D Deficiency ("26" in 2016) and supplements vitamin D without any suspected side-effects. Last vitamin D was  Still very low: Lab Results  Component Value Date   VD25OH 31 08/14/2016   Current Outpatient Prescriptions on File Prior to Visit  Medication Sig  . ALPRAZolam (XANAX) 0.5 MG tablet Take 1/2 tablet at bedtime.  Marland Kitchen amitriptyline (ELAVIL) 50 MG tablet Take 1 tablet (50 mg total) by mouth at bedtime.  Marland Kitchen amLODipine (NORVASC) 2.5 MG tablet Take 1 tablet (2.5 mg total) by mouth daily.  . Cholecalciferol (VITAMIN D-3) 5000 UNITS TABS Take 5,000 Units by mouth daily.  . cyanocobalamin (,VITAMIN B-12,) 1000 MCG/ML injection Inject 1 mL (1,000 mcg total) into the skin every 30 (thirty) days.  Marland Kitchen levothyroxine (SYNTHROID, LEVOTHROID) 125 MCG tablet TAKE 1  TABLET BY MOUTH BEFORE BREAKFAST OR AS DIRECTED.  Marland Kitchen omeprazole (PRILOSEC) 40 MG capsule Take 1 capsule (40 mg total) by mouth daily.   No current facility-administered medications on file prior to visit.    Allergies  Allergen Reactions  . Amlodipine Swelling    Swelling/edema  . Norvasc [Amlodipine Besylate]   . Sulfa Antibiotics    PMHx:   Past Medical History:  Diagnosis Date  . Allergy   . Anemia   . Anxiety   . DDD (degenerative disc disease), lumbar   . Depression   . GERD (gastroesophageal reflux disease)   . Hyperlipidemia   . Hypertension   . OA (osteoarthritis) of knee    right knee  . Pre-diabetes   . Thyroid disease   . Vitamin D deficiency    Immunization History  Administered Date(s) Administered  . DTaP 05/20/2012  . Influenza Whole 07/21/2013  . Influenza, High Dose Seasonal PF 06/29/2014, 07/30/2015, 07/10/2016  . Pneumococcal Conjugate-13 05/29/2014  . Pneumococcal Polysaccharide-23 05/07/2010, 12/05/2014  . Zoster 11/03/2006    FHx:    Reviewed / unchanged  SHx:    Reviewed / unchanged  Systems Review:  Constitutional: Denies fever, chills, wt changes, headaches, insomnia, fatigue, night sweats, change in appetite. Eyes: Denies redness, blurred vision, diplopia, discharge, itchy, watery eyes.  ENT: Denies discharge, congestion, post nasal drip, epistaxis, sore throat, earache, hearing loss, dental pain, tinnitus, vertigo, sinus pain, snoring.  CV:  Denies chest pain, palpitations, irregular heartbeat, syncope, dyspnea, diaphoresis, orthopnea, PND, claudication or edema. Respiratory: denies cough, dyspnea, DOE, pleurisy, hoarseness, laryngitis, wheezing.  Gastrointestinal: Denies dysphagia, odynophagia, heartburn, reflux, water brash, abdominal pain or cramps, nausea, vomiting, bloating, diarrhea, constipation, hematemesis, melena, hematochezia  or hemorrhoids. Genitourinary: Denies dysuria, frequency, urgency, nocturia, hesitancy, discharge,  hematuria or flank pain. Musculoskeletal: Denies arthralgias, myalgias, stiffness, jt. swelling, pain, limping or strain/sprain.  Skin: Denies pruritus, rash, hives, warts, acne, eczema or change in skin lesion(s). Neuro: No weakness, tremor, incoordination, spasms, paresthesia or pain. Psychiatric: Denies confusion, memory loss or sensory loss. Endo: Denies change in weight, skin or hair change.  Heme/Lymph: No excessive bleeding, bruising or enlarged lymph nodes.  Physical Exam  BP 138/72   Pulse 72   Temp 97.2 F (36.2 C)   Resp 16   Ht  (1.6 m)   Wt 119 lb (54 kg)   BMI 21.08 kg/m   Appears well nourished, well groomed  and in no distress.  Eyes: PERRLA, EOMs, conjunctiva no swelling or erythema. Sinuses: No frontal/maxillary tenderness ENT/Mouth: EAC's clear, TM's nl w/o erythema, bulging. Nares clear w/o erythema, swelling, exudates. Oropharynx clear without erythema or exudates. Oral hygiene is good. Tongue normal, non obstructing. Hearing intact.  Neck: Supple. Thyroid nl. Car 2+/2+ without bruits, nodes or JVD. Chest: Respirations nl with BS clear & equal w/o rales, rhonchi, wheezing or stridor.  Cor: Heart sounds normal w/ regular rate and rhythm without sig. murmurs, gallops, clicks or rubs. Peripheral pulses normal and equal  without edema.  Abdomen: Soft & bowel sounds normal. Non-tender w/o guarding, rebound, hernias, masses or organomegaly.  Lymphatics: Unremarkable.  Musculoskeletal: Full ROM all peripheral extremities, joint stability, 5/5 strength and normal gait.  Skin: Warm, dry without exposed rashes, lesions or ecchymosis apparent.  Neuro: Cranial nerves intact, reflexes equal bilaterally. Sensory-motor testing grossly intact. Tendon reflexes grossly intact.  Pysch: Alert & oriented x 3.  Insight and judgement nl & appropriate. No ideations.  Assessment and Plan:  1. Essential hypertension  - Continue medication, monitor blood pressure at home.  -  Continue DASH diet. Reminder to go to the ER if any CP,  SOB, nausea, dizziness, severe HA, changes vision/speech,  left arm numbness and tingling and jaw pain.  - CBC with Differential/Platelet - BASIC METABOLIC PANEL WITH GFR - Magnesium - TSH  2. Mixed hyperlipidemia  - Continue diet/meds, exercise,& lifestyle modifications.  - Continue monitor periodic cholesterol/liver & renal functions   - Hepatic function panel - Lipid panel - TSH  3. Pre-diabetes  - Continue diet, exercise, lifestyle modifications.  - Monitor appropriate labs.  - Hemoglobin A1c - Insulin, random  4. Vitamin D deficiency  - Continue supplementation.  - VITAMIN D 25 Hydroxy  5. Hypothyroidism,   6. Medication management  - CBC with Differential/Platelet - BASIC METABOLIC PANEL WITH GFR - Hepatic function panel - Magnesium - Lipid panel - TSH - Hemoglobin A1c - Insulin, random - VITAMIN D 25 Hydroxy  7. Anemia due to vitamin B12 deficiency       Discussed  regular exercise, BP monitoring, weight control to achieve/maintain BMI less than 25 and discussed med and SE's. Recommended labs to assess and monitor clinical status with further disposition pending results of labs. Over 30 minutes of exam, counseling, chart review was performed.

## 2017-03-03 ENCOUNTER — Encounter: Payer: Self-pay | Admitting: *Deleted

## 2017-03-03 LAB — BASIC METABOLIC PANEL WITH GFR
BUN: 27 mg/dL — ABNORMAL HIGH (ref 7–25)
CHLORIDE: 106 mmol/L (ref 98–110)
CO2: 24 mmol/L (ref 20–31)
Calcium: 9.1 mg/dL (ref 8.6–10.4)
Creat: 1.12 mg/dL — ABNORMAL HIGH (ref 0.60–0.88)
GFR, EST NON AFRICAN AMERICAN: 44 mL/min — AB (ref >=60)
GFR, Est African American: 51 mL/min — ABNORMAL LOW (ref >=60)
GLUCOSE: 89 mg/dL (ref 65–99)
POTASSIUM: 5 mmol/L (ref 3.5–5.3)
Sodium: 136 mmol/L (ref 135–146)

## 2017-03-03 LAB — CBC WITH DIFFERENTIAL/PLATELET
Basophils Absolute: 0 cells/uL (ref 0–200)
Basophils Relative: 0 %
EOS ABS: 58 {cells}/uL (ref 15–500)
Eosinophils Relative: 1 %
HEMATOCRIT: 30 % — AB (ref 35.0–45.0)
Hemoglobin: 9.7 g/dL — ABNORMAL LOW (ref 11.7–15.5)
Lymphocytes Relative: 20 %
Lymphs Abs: 1160 cells/uL (ref 850–3900)
MCH: 28.6 pg (ref 27.0–33.0)
MCHC: 32.3 g/dL (ref 32.0–36.0)
MCV: 88.5 fL (ref 80.0–100.0)
MONO ABS: 406 {cells}/uL (ref 200–950)
MPV: 8.5 fL (ref 7.5–12.5)
Monocytes Relative: 7 %
NEUTROS PCT: 72 %
Neutro Abs: 4176 cells/uL (ref 1500–7800)
Platelets: 211 10*3/uL (ref 140–400)
RBC: 3.39 MIL/uL — ABNORMAL LOW (ref 3.80–5.10)
RDW: 14.4 % (ref 11.0–15.0)
WBC: 5.8 10*3/uL (ref 3.8–10.8)

## 2017-03-03 LAB — HEPATIC FUNCTION PANEL
ALK PHOS: 65 U/L (ref 33–130)
ALT: 12 U/L (ref 6–29)
AST: 24 U/L (ref 10–35)
Albumin: 3.4 g/dL — ABNORMAL LOW (ref 3.6–5.1)
BILIRUBIN INDIRECT: 0.3 mg/dL (ref 0.2–1.2)
Bilirubin, Direct: 0.1 mg/dL (ref <=0.2)
TOTAL PROTEIN: 5.9 g/dL — AB (ref 6.1–8.1)
Total Bilirubin: 0.4 mg/dL (ref 0.2–1.2)

## 2017-03-03 LAB — LIPID PANEL
CHOL/HDL RATIO: 3.2 ratio (ref <5.0)
Cholesterol: 148 mg/dL (ref <200)
HDL: 46 mg/dL — ABNORMAL LOW (ref >50)
LDL Cholesterol: 83 mg/dL (ref <100)
Triglycerides: 93 mg/dL (ref <150)
VLDL: 19 mg/dL (ref <30)

## 2017-03-03 LAB — HEMOGLOBIN A1C
HEMOGLOBIN A1C: 5.5 % (ref <5.7)
MEAN PLASMA GLUCOSE: 111 mg/dL

## 2017-03-03 LAB — INSULIN, RANDOM: INSULIN: 2.6 u[IU]/mL (ref 2.0–19.6)

## 2017-03-03 LAB — TSH: TSH: 2.68 m[IU]/L

## 2017-03-03 LAB — VITAMIN D 25 HYDROXY (VIT D DEFICIENCY, FRACTURES): VIT D 25 HYDROXY: 57 ng/mL (ref 30–100)

## 2017-03-03 LAB — MAGNESIUM: MAGNESIUM: 1.9 mg/dL (ref 1.5–2.5)

## 2017-03-04 DIAGNOSIS — H906 Mixed conductive and sensorineural hearing loss, bilateral: Secondary | ICD-10-CM | POA: Diagnosis not present

## 2017-04-01 ENCOUNTER — Ambulatory Visit (INDEPENDENT_AMBULATORY_CARE_PROVIDER_SITE_OTHER): Payer: Medicare Other

## 2017-04-01 VITALS — BP 110/64 | Ht 63.0 in | Wt 116.4 lb

## 2017-04-01 DIAGNOSIS — D519 Vitamin B12 deficiency anemia, unspecified: Secondary | ICD-10-CM

## 2017-04-01 MED ORDER — CYANOCOBALAMIN 1000 MCG/ML IJ SOLN
1000.0000 ug | Freq: Once | INTRAMUSCULAR | Status: AC
  Administered 2017-04-01: 1000 ug via INTRAMUSCULAR

## 2017-04-01 NOTE — Progress Notes (Signed)
Pt presents for B12 injection 1000 mcg per ml 0.5 mL in left upper arm

## 2017-04-15 ENCOUNTER — Encounter: Payer: Self-pay | Admitting: Internal Medicine

## 2017-04-29 ENCOUNTER — Ambulatory Visit (INDEPENDENT_AMBULATORY_CARE_PROVIDER_SITE_OTHER): Payer: Medicare Other

## 2017-04-29 VITALS — BP 138/72 | Ht 63.0 in | Wt 117.6 lb

## 2017-04-29 DIAGNOSIS — D519 Vitamin B12 deficiency anemia, unspecified: Secondary | ICD-10-CM | POA: Diagnosis not present

## 2017-04-29 MED ORDER — CYANOCOBALAMIN 1000 MCG/ML IJ SOLN
1000.0000 ug | Freq: Once | INTRAMUSCULAR | Status: AC
  Administered 2017-04-29: 1000 ug via INTRAMUSCULAR

## 2017-04-29 NOTE — Progress Notes (Signed)
Pt presents for B12 injection 1000 mcg per ml 0.5 mL in RIGHT upper arm

## 2017-05-01 ENCOUNTER — Ambulatory Visit: Payer: Self-pay

## 2017-05-25 ENCOUNTER — Encounter (INDEPENDENT_AMBULATORY_CARE_PROVIDER_SITE_OTHER): Payer: Medicare Other | Admitting: Ophthalmology

## 2017-05-25 DIAGNOSIS — H43813 Vitreous degeneration, bilateral: Secondary | ICD-10-CM

## 2017-05-25 DIAGNOSIS — I10 Essential (primary) hypertension: Secondary | ICD-10-CM | POA: Diagnosis not present

## 2017-05-25 DIAGNOSIS — H34831 Tributary (branch) retinal vein occlusion, right eye, with macular edema: Secondary | ICD-10-CM | POA: Diagnosis not present

## 2017-05-25 DIAGNOSIS — H35033 Hypertensive retinopathy, bilateral: Secondary | ICD-10-CM

## 2017-05-25 DIAGNOSIS — H353122 Nonexudative age-related macular degeneration, left eye, intermediate dry stage: Secondary | ICD-10-CM | POA: Diagnosis not present

## 2017-05-25 DIAGNOSIS — H353111 Nonexudative age-related macular degeneration, right eye, early dry stage: Secondary | ICD-10-CM

## 2017-06-04 ENCOUNTER — Ambulatory Visit (INDEPENDENT_AMBULATORY_CARE_PROVIDER_SITE_OTHER): Payer: Medicare Other | Admitting: Physician Assistant

## 2017-06-04 ENCOUNTER — Encounter: Payer: Self-pay | Admitting: Physician Assistant

## 2017-06-04 VITALS — BP 126/70 | HR 90 | Temp 98.1°F | Resp 14 | Ht 63.0 in | Wt 116.0 lb

## 2017-06-04 DIAGNOSIS — D519 Vitamin B12 deficiency anemia, unspecified: Secondary | ICD-10-CM | POA: Diagnosis not present

## 2017-06-04 DIAGNOSIS — I10 Essential (primary) hypertension: Secondary | ICD-10-CM

## 2017-06-04 DIAGNOSIS — E441 Mild protein-calorie malnutrition: Secondary | ICD-10-CM

## 2017-06-04 DIAGNOSIS — J449 Chronic obstructive pulmonary disease, unspecified: Secondary | ICD-10-CM | POA: Diagnosis not present

## 2017-06-04 DIAGNOSIS — E039 Hypothyroidism, unspecified: Secondary | ICD-10-CM | POA: Diagnosis not present

## 2017-06-04 LAB — CBC WITH DIFFERENTIAL/PLATELET
BASOS ABS: 51 {cells}/uL (ref 0–200)
BASOS PCT: 1 %
EOS ABS: 102 {cells}/uL (ref 15–500)
Eosinophils Relative: 2 %
HEMATOCRIT: 31 % — AB (ref 35.0–45.0)
Hemoglobin: 10.2 g/dL — ABNORMAL LOW (ref 11.7–15.5)
LYMPHS PCT: 31 %
Lymphs Abs: 1581 cells/uL (ref 850–3900)
MCH: 29.2 pg (ref 27.0–33.0)
MCHC: 32.9 g/dL (ref 32.0–36.0)
MCV: 88.8 fL (ref 80.0–100.0)
MONO ABS: 357 {cells}/uL (ref 200–950)
MONOS PCT: 7 %
MPV: 8.4 fL (ref 7.5–12.5)
Neutro Abs: 3009 cells/uL (ref 1500–7800)
Neutrophils Relative %: 59 %
PLATELETS: 220 10*3/uL (ref 140–400)
RBC: 3.49 MIL/uL — ABNORMAL LOW (ref 3.80–5.10)
RDW: 14.9 % (ref 11.0–15.0)
WBC: 5.1 10*3/uL (ref 3.8–10.8)

## 2017-06-04 MED ORDER — CYANOCOBALAMIN 1000 MCG/ML IJ SOLN
1000.0000 ug | Freq: Once | INTRAMUSCULAR | Status: AC
  Administered 2017-07-07: 1000 ug via INTRAMUSCULAR

## 2017-06-04 NOTE — Progress Notes (Signed)
Assessment and Plan:  Hypertension -Continue medication, monitor blood pressure at home. Continue DASH diet.  Reminder to go to the ER if any CP, SOB, nausea, dizziness, severe HA, changes vision/speech, left arm numbness and tingling and jaw pain.   Cholesterol -Continue diet and exercise. Check cholesterol.   Vitamin D Def - check level and continue medications.   Hypothyroidism -check TSH level, continue medications the same, reminded to take on an empty stomach 30-6mns before food.   Mild malnutrition.  Has had normal CXR, normal colonoscopy 2010, normal MGM 2004 but normal breast exam continue ensure/boost daily Weight is steady, will not get work up right now, discussed with patient.    Continue diet and meds as discussed. Further disposition pending results of labs. Over 30 minutes of exam, counseling, chart review, and critical decision making was performed  Future Appointments Date Time Provider DLa Feria 06/15/2017 12:45 PM MHayden Pedro MD [facility] None  09/07/2017 2:00 PM CVicie Mutters PA-C [id number] None     HPI 83y.o. female  presents for 3 month follow up on hypertension, cholesterol, prediabetes, and vitamin D deficiency.   Her blood pressure has been controlled at home, today their BP is BP: 126/70  She does workout. She denies chest pain, shortness of breath, dizziness.  She is not on cholesterol medication. Her cholesterol is at goal. The cholesterol last visit was:   Lab Results  Component Value Date   CHOL 148 03/02/2017   HDL 46 (L) 03/02/2017   LDLCALC 83 03/02/2017   TRIG 93 03/02/2017   CHOLHDL 3.2 03/02/2017   She has been working on diet and exercise for prediabetes, and denies paresthesia of the feet, polydipsia, polyuria and visual disturbances. Last A1C in the office was:  Lab Results  Component Value Date   HGBA1C 5.5 03/02/2017  Patient is on Vitamin D supplement.   Lab Results  Component Value Date   VD25OH 51 03/02/2017   She had her 5th shot for mac degen in her right eye with Dr. MRodman Keyand states that it is helping, seeing once a month. She is on thyroid medication, she is on 1 pill T, T, S, S and 1/2 on M, W, F.  Her medication was changed last visit.   Lab Results  Component Value Date   TSH 2.68 03/02/2017  .  BMI is Body mass index is 20.55 kg/m.  Weight is holding steady, she is on ensure.  She had normal colonoscopy in 2010, last MGM 2009, declines another, had normal CXR 07/2016. She denies headache, trouble swallowing, SOB, cough, wheezing, GERD, diarrhea/constipation, joint pain. Only has back pain.  Wt Readings from Last 3 Encounters:  06/04/17 116 lb (52.6 kg)  04/29/17 117 lb 9.6 oz (53.3 kg)  04/01/17 116 lb 6.4 oz (52.8 kg)    Current Medications:  Current Outpatient Prescriptions on File Prior to Visit  Medication Sig Dispense Refill  . ALPRAZolam (XANAX) 0.5 MG tablet Take 1/2 tablet at bedtime. 60 tablet 0  . amitriptyline (ELAVIL) 50 MG tablet Take 1 tablet (50 mg total) by mouth at bedtime. 90 tablet 1  . amLODipine (NORVASC) 2.5 MG tablet Take 1 tablet (2.5 mg total) by mouth daily. 90 tablet 2  . Cholecalciferol (VITAMIN D-3) 5000 UNITS TABS Take 5,000 Units by mouth daily.    . cyanocobalamin (,VITAMIN B-12,) 1000 MCG/ML injection Inject 1 mL (1,000 mcg total) into the skin every 30 (thirty) days. 1 mL 0  . levothyroxine (SYNTHROID,  LEVOTHROID) 125 MCG tablet TAKE 1 TABLET BY MOUTH BEFORE BREAKFAST OR AS DIRECTED. 90 tablet 2  . omeprazole (PRILOSEC) 40 MG capsule Take 1 capsule (40 mg total) by mouth daily. 90 capsule 3   No current facility-administered medications on file prior to visit.    Medical History:  Past Medical History:  Diagnosis Date  . Allergy   . Anemia   . Anxiety   . DDD (degenerative disc disease), lumbar   . Depression   . GERD (gastroesophageal reflux disease)   . Hyperlipidemia   . Hypertension   . OA (osteoarthritis) of knee     right knee  . Pre-diabetes   . Thyroid disease   . Vitamin D deficiency    Allergies:  Allergies  Allergen Reactions  . Amlodipine Swelling    Swelling/edema  . Norvasc [Amlodipine Besylate]   . Sulfa Antibiotics      Review of Systems:  Review of Systems  Constitutional: Negative for chills, diaphoresis, fever, malaise/fatigue and weight loss.  HENT: Negative.   Eyes: Negative.   Respiratory: Negative.  Negative for cough and shortness of breath.   Cardiovascular: Negative.  Negative for chest pain and palpitations.  Gastrointestinal: Negative.  Negative for abdominal pain and diarrhea.  Genitourinary: Negative.   Musculoskeletal: Negative.   Skin: Negative.   Neurological: Negative.  Negative for weakness.  Endo/Heme/Allergies: Negative.   Psychiatric/Behavioral: Negative.  Negative for depression.    Family history- Review and unchanged Social history- Review and unchanged Physical Exam: BP 126/70   Pulse 90   Temp 98.1 F (36.7 C)   Resp 14   Ht _0  (1.6 m)   Wt 116 lb (52.6 kg)   SpO2 97%   BMI 20.55 kg/m  Wt Readings from Last 3 Encounters:  06/04/17 116 lb (52.6 kg)  04/29/17 117 lb 9.6 oz (53.3 kg)  04/01/17 116 lb 6.4 oz (52.8 kg)   General appearance: alert, no distress, WD/WN,  female HEENT: Left eye ptosis, normocephalic, sclerae anicteric, TM pearly on left, right TM white/scarred, nares patent, no discharge or erythema, pharynx normal Oral cavity: MMM, no lesions Neck: supple, no lymphadenopathy, no thyromegaly, no masses Heart: RRR, normal S1, S2, no murmurs Lungs: CTA bilaterally, no wheezes, rhonchi, or rales Abdomen: +bs, soft, non tender, non distended, no masses, no hepatomegaly, no splenomegaly Musculoskeletal: nontender, no swelling, no obvious deformity, mild- moderate cervical kyphosis Extremities: no edema, no cyanosis, no clubbing Pulses: 2+ symmetric, upper and lower extremities, normal cap refill Neurological: alert, oriented x  3, CN2-12 intact, strength normal upper extremities and lower extremities for age, sensation normal throughout, DTRs 2+ throughout, no cerebellar signs, gait normal Psychiatric: normal affect, behavior normal, pleasant    Vicie Mutters, PA-C 3:10 PM [facility]

## 2017-06-05 LAB — BASIC METABOLIC PANEL WITH GFR
BUN: 25 mg/dL (ref 7–25)
CHLORIDE: 107 mmol/L (ref 98–110)
CO2: 22 mmol/L (ref 20–31)
CREATININE: 1.03 mg/dL — AB (ref 0.60–0.88)
Calcium: 9.3 mg/dL (ref 8.6–10.4)
GFR, EST AFRICAN AMERICAN: 56 mL/min — AB (ref >=60)
GFR, Est Non African American: 49 mL/min — ABNORMAL LOW (ref >=60)
GLUCOSE: 90 mg/dL (ref 65–99)
POTASSIUM: 4.3 mmol/L (ref 3.5–5.3)
Sodium: 137 mmol/L (ref 135–146)

## 2017-06-05 LAB — HEPATIC FUNCTION PANEL
ALBUMIN: 3.8 g/dL (ref 3.6–5.1)
ALK PHOS: 69 U/L (ref 33–130)
ALT: 10 U/L (ref 6–29)
AST: 21 U/L (ref 10–35)
Bilirubin, Direct: 0.1 mg/dL (ref <=0.2)
Indirect Bilirubin: 0.2 mg/dL (ref 0.2–1.2)
TOTAL PROTEIN: 6.4 g/dL (ref 6.1–8.1)
Total Bilirubin: 0.3 mg/dL (ref 0.2–1.2)

## 2017-06-05 LAB — MAGNESIUM: Magnesium: 1.9 mg/dL (ref 1.5–2.5)

## 2017-06-05 LAB — TSH: TSH: 2.08 m[IU]/L

## 2017-06-05 NOTE — Progress Notes (Signed)
Pt aware of lab results & voiced understanding of those results. Lab results were printed & mail per pt's request along w/ a letter to explain the results.

## 2017-06-15 ENCOUNTER — Encounter (INDEPENDENT_AMBULATORY_CARE_PROVIDER_SITE_OTHER): Payer: Medicare Other | Admitting: Ophthalmology

## 2017-06-15 DIAGNOSIS — H353122 Nonexudative age-related macular degeneration, left eye, intermediate dry stage: Secondary | ICD-10-CM

## 2017-06-15 DIAGNOSIS — H34831 Tributary (branch) retinal vein occlusion, right eye, with macular edema: Secondary | ICD-10-CM | POA: Diagnosis not present

## 2017-06-15 DIAGNOSIS — I10 Essential (primary) hypertension: Secondary | ICD-10-CM | POA: Diagnosis not present

## 2017-06-15 DIAGNOSIS — H43813 Vitreous degeneration, bilateral: Secondary | ICD-10-CM

## 2017-06-15 DIAGNOSIS — H35033 Hypertensive retinopathy, bilateral: Secondary | ICD-10-CM

## 2017-06-17 ENCOUNTER — Other Ambulatory Visit: Payer: Self-pay | Admitting: Physician Assistant

## 2017-06-17 MED ORDER — ALPRAZOLAM 0.5 MG PO TABS: ORAL_TABLET | ORAL | 0 refills | Status: DC

## 2017-06-17 NOTE — Progress Notes (Signed)
Xanax was called into pharmacy on 15th Aug 2018 at 2pm by DD.

## 2017-06-30 ENCOUNTER — Other Ambulatory Visit: Payer: Self-pay | Admitting: Internal Medicine

## 2017-07-03 ENCOUNTER — Other Ambulatory Visit: Payer: Self-pay | Admitting: Physician Assistant

## 2017-07-03 DIAGNOSIS — E039 Hypothyroidism, unspecified: Secondary | ICD-10-CM

## 2017-07-07 ENCOUNTER — Ambulatory Visit (INDEPENDENT_AMBULATORY_CARE_PROVIDER_SITE_OTHER): Payer: Medicare Other | Admitting: *Deleted

## 2017-07-07 DIAGNOSIS — D519 Vitamin B12 deficiency anemia, unspecified: Secondary | ICD-10-CM | POA: Diagnosis not present

## 2017-07-07 NOTE — Progress Notes (Signed)
Patient here for a NV to get her B12 injection 0.5 ml [place] in right upper arm.  Patient BP is 138/66 and pulse is 80.  Her wight is 114 lb.

## 2017-07-29 ENCOUNTER — Encounter: Payer: Self-pay | Admitting: Physician Assistant

## 2017-08-08 ENCOUNTER — Other Ambulatory Visit: Payer: Self-pay | Admitting: Internal Medicine

## 2017-08-08 ENCOUNTER — Other Ambulatory Visit: Payer: Self-pay | Admitting: Physician Assistant

## 2017-08-10 ENCOUNTER — Ambulatory Visit (INDEPENDENT_AMBULATORY_CARE_PROVIDER_SITE_OTHER): Payer: Medicare Other

## 2017-08-10 VITALS — BP 124/70 | Ht 63.0 in | Wt 114.0 lb

## 2017-08-10 DIAGNOSIS — D519 Vitamin B12 deficiency anemia, unspecified: Secondary | ICD-10-CM | POA: Diagnosis not present

## 2017-08-10 MED ORDER — CYANOCOBALAMIN 1000 MCG/ML IJ SOLN
1000.0000 ug | Freq: Once | INTRAMUSCULAR | Status: AC
  Administered 2017-08-10: 1000 ug via INTRAMUSCULAR

## 2017-08-10 NOTE — Progress Notes (Signed)
Patient here for a NV to get her B12 injection 0.5 ml [place] in right upper arm.

## 2017-09-07 ENCOUNTER — Ambulatory Visit (INDEPENDENT_AMBULATORY_CARE_PROVIDER_SITE_OTHER): Payer: Medicare Other | Admitting: Physician Assistant

## 2017-09-07 ENCOUNTER — Encounter: Payer: Self-pay | Admitting: Physician Assistant

## 2017-09-07 VITALS — BP 124/80 | HR 63 | Temp 97.3°F | Resp 14 | Ht 63.5 in | Wt 118.2 lb

## 2017-09-07 DIAGNOSIS — R6889 Other general symptoms and signs: Secondary | ICD-10-CM

## 2017-09-07 DIAGNOSIS — E441 Mild protein-calorie malnutrition: Secondary | ICD-10-CM | POA: Diagnosis not present

## 2017-09-07 DIAGNOSIS — E039 Hypothyroidism, unspecified: Secondary | ICD-10-CM | POA: Diagnosis not present

## 2017-09-07 DIAGNOSIS — D649 Anemia, unspecified: Secondary | ICD-10-CM

## 2017-09-07 DIAGNOSIS — K219 Gastro-esophageal reflux disease without esophagitis: Secondary | ICD-10-CM | POA: Diagnosis not present

## 2017-09-07 DIAGNOSIS — J449 Chronic obstructive pulmonary disease, unspecified: Secondary | ICD-10-CM

## 2017-09-07 DIAGNOSIS — Z136 Encounter for screening for cardiovascular disorders: Secondary | ICD-10-CM

## 2017-09-07 DIAGNOSIS — R7309 Other abnormal glucose: Secondary | ICD-10-CM

## 2017-09-07 DIAGNOSIS — D519 Vitamin B12 deficiency anemia, unspecified: Secondary | ICD-10-CM | POA: Diagnosis not present

## 2017-09-07 DIAGNOSIS — Z6820 Body mass index (BMI) 20.0-20.9, adult: Secondary | ICD-10-CM

## 2017-09-07 DIAGNOSIS — Z0000 Encounter for general adult medical examination without abnormal findings: Secondary | ICD-10-CM

## 2017-09-07 DIAGNOSIS — Z23 Encounter for immunization: Secondary | ICD-10-CM

## 2017-09-07 DIAGNOSIS — Z79899 Other long term (current) drug therapy: Secondary | ICD-10-CM | POA: Diagnosis not present

## 2017-09-07 DIAGNOSIS — E782 Mixed hyperlipidemia: Secondary | ICD-10-CM

## 2017-09-07 DIAGNOSIS — Z0001 Encounter for general adult medical examination with abnormal findings: Secondary | ICD-10-CM

## 2017-09-07 DIAGNOSIS — E559 Vitamin D deficiency, unspecified: Secondary | ICD-10-CM

## 2017-09-07 DIAGNOSIS — H3530 Unspecified macular degeneration: Secondary | ICD-10-CM

## 2017-09-07 DIAGNOSIS — I10 Essential (primary) hypertension: Secondary | ICD-10-CM

## 2017-09-07 NOTE — Progress Notes (Signed)
MEDICARE ANNUAL WELLNESS VISIT AND CPE  Assessment:   Essential hypertension - continue medications, DASH diet, exercise and monitor at home. Call if greater than 130/80.  -     CBC with Differential/Platelet -     BASIC METABOLIC PANEL WITH GFR -     Hepatic function panel -     TSH -     EKG 12-Lead -     Urinalysis, Routine w reflex microscopic -     Microalbumin / creatinine urine ratio  Chronic obstructive pulmonary disease, unspecified COPD type (HCC) Avoid triggers, continue allergy pill  Mixed hyperlipidemia -decrease fatty foods, increase activity.  -     Lipid panel  Hypothyroidism, unspecified type Hypothyroidism-check TSH level, continue medications the same, reminded to take on an empty stomach 30-66mns before food.  -     TSH  Malnutrition of mild degree (HCC) Continue protein/weight gain/ensure -     Hepatic function panel  Other abnormal glucose Monitor  Medication management -     Magnesium  Anemia due to vitamin B12 deficiency, unspecified B12 deficiency type Continue injections  Vitamin D deficiency Continue supplement  Anemia, unspecified type Monitor  Macular degeneration, unspecified laterality, unspecified type Continue follow up Dr. MZigmund Daniel Gastroesophageal reflux disease, esophagitis presence not specified Continue PPI/H2 blocker, diet discussed  Encounter for Medicare annual wellness exam 1 year  BMI 20.0-20.9, adult  Needs flu shot -     Flu vaccine HIGH DOSE PF  Over 40 minutes of exam, counseling, chart review and critical decision making was performed Future Appointments  [facility] Time [facility] 10/05/2017 12:30 PM MHayden Pedro MD [facility] None  09/07/2018  2:00 PM CVicie Mutters PA-C [facility] None     Plan:   During the course of the visit the patient was educated and counseled about appropriate screening and preventive services including:    Pneumococcal vaccine   Prevnar 13  Influenza  vaccine  Td vaccine  Screening electrocardiogram  Bone densitometry screening  Colorectal cancer screening  Diabetes screening  Glaucoma screening  Nutrition counseling   Advanced directives: requested   Subjective:  Shelby Bradley a 83y.o. female who presents for Medicare Annual Wellness Visit and complete physical.    Her blood pressure has been controlled at home, today their BP is BP: 124/80  She does not workout. She denies chest pain, shortness of breath, dizziness.  She is not on cholesterol medication and denies myalgias. Her cholesterol is at goal. The cholesterol last visit was:   Lab Results  Component Value Date   CHOL 148 03/02/2017   HDL 46 (L) 03/02/2017   LDLCALC 83 03/02/2017   TRIG 93 03/02/2017   CHOLHDL 3.2 03/02/2017    She has been working on diet and exercise for prediabetes, and denies paresthesia of the feet, polydipsia, polyuria and visual disturbances. Last A1C in the office was:  Lab Results  Component Value Date   HGBA1C 5.5 03/02/2017   Last GFR: Lab Results  Component Value Date   GFRNONAA 49 (L) 06/04/2017   Patient is on Vitamin D supplement.   Lab Results  Component Value Date   VD25OH 53804/30/2018     She is on thyroid medication. Her medication was changed last visit, she is on  1 pill sat and sun and 1/2 pill the rest of the week.   Lab Results  Component Value Date   TSH 2.08 06/04/2017  .  She has chronic hearing loss  in left ear from mastoid sx when she was younger, wears bilateral hearing aids.  Follows up with Dr. Katy Fitch regularly, and he sent her to Dr. Rodman Key for mac degeneration, she gets injections of bevacizumab, she does still drive but does not drive at night.  Will be a year that she lost her sister in Jan, doing better with depression.  She is on elavil for migraines/headaches that helps, takes 1/2 of elavil.  She will take xanax occ during the day for anxiety.  BMI is Body mass index is 20.61 kg/m.,  she is working gaining weight.  Wt Readings from Last 3 Encounters:  09/07/17 118 lb 3.2 oz (53.6 kg)  08/10/17 114 lb (51.7 kg)  06/04/17 116 lb (52.6 kg)    Medication Review: Current Outpatient Medications on File Prior to Visit  Medication Sig Dispense Refill  . ALPRAZolam (XANAX) 0.5 MG tablet Take 1/2 tablet at bedtime. 60 tablet 0  . amitriptyline (ELAVIL) 50 MG tablet TAKE 1 TABLET BY MOUTH AT BEDTIME 90 tablet 0  . amLODipine (NORVASC) 2.5 MG tablet TAKE 1 TABLET BY MOUTH EVERY DAY 90 tablet 2  . Cholecalciferol (VITAMIN D-3) 5000 UNITS TABS Take 5,000 Units by mouth daily.    . cyanocobalamin (,VITAMIN B-12,) 1000 MCG/ML injection INJECT 1 MILLILITER INTO THE SKIN EVERY 30 DAYS 1 mL 6  . levothyroxine (SYNTHROID, LEVOTHROID) 125 MCG tablet TAKE 1 TABLET BY MOUTH BEFORE BREAKFAST OR AS DIRECTED. 90 tablet 0  . omeprazole (PRILOSEC) 40 MG capsule TAKE 1 CAPSULE (40 MG TOTAL) BY MOUTH DAILY. 90 capsule 0   No current facility-administered medications on file prior to visit.     Allergies  Allergen Reactions  . Amlodipine Swelling    Swelling/edema  . Norvasc [Amlodipine Besylate]   . Sulfa Antibiotics     Current Problems (verified) Patient Active Problem List   Diagnosis Date Noted  . Malnutrition of mild degree ([place]) 11/18/2016  . COPD (chronic obstructive pulmonary disease) ([place]) 07/10/2016  . Encounter for Medicare annual wellness exam 10/02/2015  . Hypothyroidism 06/28/2015  . Macular degeneration 06/28/2015  . Lower back pain 06/28/2015  . Medication management 12/05/2014  . Hypertension   . Hyperlipidemia   . B12 deficiency anemia   . Anemia   . GERD (gastroesophageal reflux disease)   . Vitamin D deficiency   . Other abnormal glucose     Screening Tests Immunization History  Administered Date(s) Administered  . DTaP 05/20/2012  . Influenza Whole 07/21/2013  . Influenza, High Dose Seasonal PF 06/29/2014, 07/30/2015, 07/10/2016, 09/07/2017  .  Pneumococcal Conjugate-13 05/29/2014  . Pneumococcal Polysaccharide-23 05/07/2010, 12/05/2014  . Zoster 11/03/2006   Preventative care: Last colonoscopy: 2010 will not get another Last mammogram: 2009 declines another Last pap smear/pelvic exam: remote DEXA: 2013 osteoporosis, declines another  Prior vaccinations: TD or Tdap: 2013  Influenza: 2017 DUE Pneumococcal: 2016 Prevnar13: 2015 Shingles/Zostavax: 2008  Names of Other Physician/Practitioners you currently use: 1. [facility] here for primary care 2. Dr. Katy Fitch, eye doctor, once a year 3. Ronnell Freshwater, dentist, once a year 4. Dr. Zigmund Daniel, treated MD, June 15 2017 Patient Care Team: Unk Pinto, MD as PCP - General (Internal Medicine) Laurence Spates, MD as Consulting Physician (Gastroenterology) Berenice Primas, MD as Referring Physician (Orthopedic Surgery) Warden Fillers, MD as Consulting Physician (Optometry)  SURGICAL HISTORY She  has a past surgical history that includes Knee arthroscopy (Left) and Cataract extraction. FAMILY HISTORY Her family history includes COPD in her  father; Heart attack in her brother and brother; Hypertension in her mother. SOCIAL HISTORY She  reports that  has never smoked. she has never used smokeless tobacco. She reports that she does not drink alcohol or use drugs.   MEDICARE WELLNESS OBJECTIVES: Physical activity: Current Exercise Habits: The patient does not participate in regular exercise at present, Exercise limited by: orthopedic condition(s) Cardiac risk factors: Cardiac Risk Factors include: advanced age (>47mn, >>53women);hypertension;dyslipidemia;sedentary lifestyle Depression/mood screen:   [facility]  Decreased Interest 0  Down, Depressed, Hopeless 0  PHQ - 2 Score 0    ADLs:  In your present state of health, do you have any difficulty performing the following activities: 09/07/2017 03/02/2017   Hearing? Y N  Vision? N N  Comment follows with Dr. MZigmund Danieland gets injections for MD -  Difficulty concentrating or making decisions? N N  Walking or climbing stairs? N N  Dressing or bathing? N N  Doing errands, shopping? N N  Comment does not drive at night -  Preparing Food and eating ? N -  Using the Toilet? N -  In the past six months, have you accidently leaked urine? N -  Do you have problems with loss of bowel control? N -  Managing your Medications? N -  Managing your Finances? N -  Housekeeping or managing your Housekeeping? N -  Some recent data might be hidden    Cognitive Testing  Alert? Yes  Normal Appearance?Yes  Oriented to person? Yes  Place? Yes   Time? Yes  Recall of three objects?  Yes  Can perform simple calculations? Yes  Displays appropriate judgment?Yes  Can read the correct time from a watch face?Yes  EOL planning: Does Patient Have a Medical Advance Directive?: Yes Type of Advance Directive: Healthcare Power of Attorney, Living will [place] Chart?: No - copy requested  Review of Systems  Constitutional: Negative for chills, fever and malaise/fatigue.  HENT: Negative for congestion, ear pain and sore throat.   Eyes: Negative.   Respiratory: Negative for cough, shortness of breath and wheezing.   Cardiovascular: Negative for chest pain, palpitations and leg swelling.  Gastrointestinal: Negative for abdominal pain, blood in stool, constipation, diarrhea, heartburn and melena.  Genitourinary: Negative.   Skin: Negative.   Neurological: Negative for dizziness, sensory change, loss of consciousness and headaches.  Psychiatric/Behavioral: Negative for depression. The patient is not nervous/anxious and does not have insomnia.      Objective:     Today's Vitals   09/07/17 1401  BP: 124/80  Pulse: 63  Resp: 14  Temp: (!) 97.3 F (36.3 C)  SpO2: 95%  Weight: 118 lb 3.2 oz (53.6 kg)  Height: 5' 3.5" (1.613 m)   PainSc: 0-No pain   Body mass index is 20.61 kg/m.  General appearance: alert, no distress, WD/WN,  female HEENT: Left eye ptosis, normocephalic, sclerae anicteric, TM pearly on left, right TM white/scarred or absent TM, nares patent, no discharge or erythema, pharynx normal Oral cavity: MMM, no lesions Neck: supple, no lymphadenopathy, no thyromegaly, no masses Heart: RRR, normal S1, S2, no murmurs Lungs: CTA bilaterally, no wheezes, rhonchi, or rales Abdomen: +bs, soft, non tender, non distended, no masses, no hepatomegaly, no splenomegaly Musculoskeletal: nontender, no swelling, no obvious deformity, mild- moderate cervical kyphosis Extremities: no edema, no cyanosis, no clubbing Pulses: 2+ symmetric, upper and lower extremities, normal cap refill Neurological: alert, oriented x 3, CN2-12 intact, strength normal upper  extremities and lower extremities, sensation normal throughout, DTRs 2+ throughout, no cerebellar signs, gait normal Psychiatric: normal affect, behavior normal, pleasant  Breast: nontender, no masses or lumps, no skin changes, no nipple discharge or inversion, no axillary lymphadenopathy  Medicare Attestation I have personally reviewed: The patient's medical and social history Their use of alcohol, tobacco or illicit drugs Their current medications and supplements The patient's functional ability including ADLs,fall risks, home safety risks, cognitive, and hearing and visual impairment Diet and physical activities Evidence for depression or mood disorders  The patient's weight, height, BMI, and visual acuity have been recorded in the chart.  I have made referrals, counseling, and provided education to the patient based on review of the above and I have provided the patient with a written personalized care plan for preventive services.     Vicie Mutters, PA-C   09/07/2017

## 2017-09-07 NOTE — Patient Instructions (Signed)
Food Choices for Gastroesophageal Reflux Disease, Adult When you have gastroesophageal reflux disease (GERD), the foods you eat and your eating habits are very important. Choosing the right foods can help ease your discomfort. What guidelines do I need to follow?  Choose fruits, vegetables, whole grains, and low-fat dairy products.  Choose low-fat meat, fish, and poultry.  Limit fats such as oils, salad dressings, butter, nuts, and avocado.  Keep a food diary. This helps you identify foods that cause symptoms.  Avoid foods that cause symptoms. These may be different for everyone.  Eat small meals often instead of 3 large meals a day.  Eat your meals slowly, in a place where you are relaxed.  Limit fried foods.  Cook foods using methods other than frying.  Avoid drinking alcohol.  Avoid drinking large amounts of liquids with your meals.  Avoid bending over or lying down until 2-3 hours after eating. What foods are not recommended? These are some foods and drinks that may make your symptoms worse: Vegetables  Tomatoes. Tomato juice. Tomato and spaghetti sauce. Chili peppers. Onion and garlic. Horseradish. Fruits  Oranges, grapefruit, and lemon (fruit and juice). Meats  High-fat meats, fish, and poultry. This includes hot dogs, ribs, ham, sausage, salami, and bacon. Dairy  Whole milk and chocolate milk. Sour cream. Cream. Butter. Ice cream. Cream cheese. Drinks  Coffee and tea. Bubbly (carbonated) drinks or energy drinks. Condiments  Hot sauce. Barbecue sauce. Sweets/Desserts  Chocolate and cocoa. Donuts. Peppermint and spearmint. Fats and Oils  High-fat foods. This includes French fries and potato chips. Other  Vinegar. Strong spices. This includes black pepper, white pepper, red pepper, cayenne, curry powder, cloves, ginger, and chili powder. The items listed above may not be a complete list of foods and drinks to avoid. Contact your dietitian for more information.    This information is not intended to replace advice given to you by your health care provider. Make sure you discuss any questions you have with your health care provider. Document Released: 04/20/2012 Document Revised: 03/27/2016 Document Reviewed: 08/24/2013 Elsevier Interactive Patient Education  2017 Elsevier Inc.  

## 2017-09-08 ENCOUNTER — Ambulatory Visit: Payer: Medicare Other

## 2017-09-08 LAB — CBC WITH DIFFERENTIAL/PLATELET
BASOS ABS: 41 {cells}/uL (ref 0–200)
Basophils Relative: 0.7 %
EOS PCT: 0.9 %
Eosinophils Absolute: 52 cells/uL (ref 15–500)
HEMATOCRIT: 28.4 % — AB (ref 35.0–45.0)
Hemoglobin: 9.6 g/dL — ABNORMAL LOW (ref 11.7–15.5)
LYMPHS ABS: 1259 {cells}/uL (ref 850–3900)
MCH: 29.6 pg (ref 27.0–33.0)
MCHC: 33.8 g/dL (ref 32.0–36.0)
MCV: 87.7 fL (ref 80.0–100.0)
MONOS PCT: 7.5 %
MPV: 9.3 fL (ref 7.5–12.5)
NEUTROS PCT: 69.2 %
Neutro Abs: 4014 cells/uL (ref 1500–7800)
Platelets: 209 10*3/uL (ref 140–400)
RBC: 3.24 10*6/uL — ABNORMAL LOW (ref 3.80–5.10)
RDW: 13.2 % (ref 11.0–15.0)
Total Lymphocyte: 21.7 %
WBC mixed population: 435 cells/uL (ref 200–950)
WBC: 5.8 10*3/uL (ref 3.8–10.8)

## 2017-09-08 LAB — URINALYSIS, ROUTINE W REFLEX MICROSCOPIC
BACTERIA UA: NONE SEEN /HPF
Bilirubin Urine: NEGATIVE
GLUCOSE, UA: NEGATIVE
Hyaline Cast: NONE SEEN /LPF
KETONES UR: NEGATIVE
LEUKOCYTES UA: NEGATIVE
Nitrite: NEGATIVE
PH: 5.5 (ref 5.0–8.0)
Protein, ur: NEGATIVE
RBC / HPF: NONE SEEN /HPF (ref 0–2)
SPECIFIC GRAVITY, URINE: 1.008 (ref 1.001–1.03)
Squamous Epithelial / LPF: NONE SEEN /HPF (ref <=5)
WBC UA: NONE SEEN /HPF (ref 0–5)

## 2017-09-08 LAB — HEPATIC FUNCTION PANEL
AG Ratio: 1.5 (calc) (ref 1.0–2.5)
ALKALINE PHOSPHATASE (APISO): 59 U/L (ref 33–130)
ALT: 9 U/L (ref 6–29)
AST: 18 U/L (ref 10–35)
Albumin: 3.7 g/dL (ref 3.6–5.1)
BILIRUBIN DIRECT: 0.1 mg/dL (ref 0.0–0.2)
BILIRUBIN INDIRECT: 0.3 mg/dL (ref 0.2–1.2)
BILIRUBIN TOTAL: 0.4 mg/dL (ref 0.2–1.2)
Globulin: 2.5 g/dL (calc) (ref 1.9–3.7)
Total Protein: 6.2 g/dL (ref 6.1–8.1)

## 2017-09-08 LAB — BASIC METABOLIC PANEL WITH GFR
BUN / CREAT RATIO: 20 (calc) (ref 6–22)
BUN: 21 mg/dL (ref 7–25)
CO2: 25 mmol/L (ref 20–32)
Calcium: 9.2 mg/dL (ref 8.6–10.4)
Chloride: 106 mmol/L (ref 98–110)
Creat: 1.04 mg/dL — ABNORMAL HIGH (ref 0.60–0.88)
GFR, EST NON AFRICAN AMERICAN: 48 mL/min/{1.73_m2} — AB (ref >=60)
GFR, Est African American: 56 mL/min/{1.73_m2} — ABNORMAL LOW (ref >=60)
Glucose, Bld: 91 mg/dL (ref 65–99)
POTASSIUM: 4.9 mmol/L (ref 3.5–5.3)
SODIUM: 137 mmol/L (ref 135–146)

## 2017-09-08 LAB — LIPID PANEL
CHOL/HDL RATIO: 3.1 (calc) (ref <5.0)
Cholesterol: 151 mg/dL (ref <200)
HDL: 49 mg/dL — ABNORMAL LOW (ref >50)
LDL Cholesterol (Calc): 81 mg/dL (calc)
NON-HDL CHOLESTEROL (CALC): 102 mg/dL (ref <130)
Triglycerides: 113 mg/dL (ref <150)

## 2017-09-08 LAB — MICROALBUMIN / CREATININE URINE RATIO
CREATININE, URINE: 34 mg/dL (ref 20–275)
MICROALB UR: 1 mg/dL
MICROALB/CREAT RATIO: 29 ug/mg{creat} (ref <30)

## 2017-09-08 LAB — MAGNESIUM: MAGNESIUM: 1.8 mg/dL (ref 1.5–2.5)

## 2017-09-08 LAB — TSH: TSH: 2.53 mIU/L (ref 0.40–4.50)

## 2017-09-09 NOTE — Progress Notes (Signed)
Pt aware of lab results & voiced understanding of those results. Pt was then transferred to front office for: Follow up for repeat CBC in 1 month nurse visit with iron, ferritin, B12. Per pt's request lab results have been mailed out to pt.

## 2017-10-05 ENCOUNTER — Encounter (INDEPENDENT_AMBULATORY_CARE_PROVIDER_SITE_OTHER): Payer: Medicare Other | Admitting: Ophthalmology

## 2017-10-05 DIAGNOSIS — H353122 Nonexudative age-related macular degeneration, left eye, intermediate dry stage: Secondary | ICD-10-CM | POA: Diagnosis not present

## 2017-10-05 DIAGNOSIS — I10 Essential (primary) hypertension: Secondary | ICD-10-CM | POA: Diagnosis not present

## 2017-10-05 DIAGNOSIS — H35033 Hypertensive retinopathy, bilateral: Secondary | ICD-10-CM | POA: Diagnosis not present

## 2017-10-05 DIAGNOSIS — H34831 Tributary (branch) retinal vein occlusion, right eye, with macular edema: Secondary | ICD-10-CM

## 2017-10-05 DIAGNOSIS — H43813 Vitreous degeneration, bilateral: Secondary | ICD-10-CM | POA: Diagnosis not present

## 2017-10-07 ENCOUNTER — Ambulatory Visit (INDEPENDENT_AMBULATORY_CARE_PROVIDER_SITE_OTHER): Payer: Medicare Other

## 2017-10-07 DIAGNOSIS — D508 Other iron deficiency anemias: Secondary | ICD-10-CM | POA: Diagnosis not present

## 2017-10-07 DIAGNOSIS — D518 Other vitamin B12 deficiency anemias: Secondary | ICD-10-CM

## 2017-10-07 DIAGNOSIS — D649 Anemia, unspecified: Secondary | ICD-10-CM | POA: Diagnosis not present

## 2017-10-07 MED ORDER — CYANOCOBALAMIN 1000 MCG/ML IJ SOLN: 1000.0000 ug | Freq: Once | INTRAMUSCULAR | Status: DC

## 2017-10-08 LAB — CBC WITH DIFFERENTIAL/PLATELET
BASOS ABS: 39 {cells}/uL (ref 0–200)
Basophils Relative: 0.7 %
EOS ABS: 50 {cells}/uL (ref 15–500)
EOS PCT: 0.9 %
HEMATOCRIT: 29.5 % — AB (ref 35.0–45.0)
Hemoglobin: 9.7 g/dL — ABNORMAL LOW (ref 11.7–15.5)
LYMPHS ABS: 1172 {cells}/uL (ref 850–3900)
MCH: 29.2 pg (ref 27.0–33.0)
MCHC: 32.9 g/dL (ref 32.0–36.0)
MCV: 88.9 fL (ref 80.0–100.0)
MONOS PCT: 6.7 %
MPV: 9.3 fL (ref 7.5–12.5)
NEUTROS PCT: 70.4 %
Neutro Abs: 3872 cells/uL (ref 1500–7800)
Platelets: 223 10*3/uL (ref 140–400)
RBC: 3.32 10*6/uL — ABNORMAL LOW (ref 3.80–5.10)
RDW: 13 % (ref 11.0–15.0)
Total Lymphocyte: 21.3 %
WBC mixed population: 369 cells/uL (ref 200–950)
WBC: 5.5 10*3/uL (ref 3.8–10.8)

## 2017-10-08 LAB — IRON, TOTAL/TOTAL IRON BINDING CAP
%SAT: 20 % (ref 11–50)
IRON: 50 ug/dL (ref 45–160)
TIBC: 247 ug/dL — AB (ref 250–450)

## 2017-10-08 LAB — VITAMIN B12: Vitamin B-12: >2000 pg/mL — ABNORMAL HIGH (ref 200–1100)

## 2017-10-08 LAB — TEST AUTHORIZATION

## 2017-10-08 LAB — FERRITIN: FERRITIN: 34 ng/mL (ref 20–288)

## 2017-10-15 NOTE — Progress Notes (Signed)
Pt aware of lab results & voiced understanding of those results.

## 2017-11-02 ENCOUNTER — Other Ambulatory Visit: Payer: Self-pay

## 2017-11-02 MED ORDER — OMEPRAZOLE 40 MG PO CPDR: 40.0000 mg | DELAYED_RELEASE_CAPSULE | Freq: Every day | ORAL | 0 refills | Status: DC

## 2017-11-09 ENCOUNTER — Ambulatory Visit (INDEPENDENT_AMBULATORY_CARE_PROVIDER_SITE_OTHER): Payer: Medicare Other

## 2017-11-09 VITALS — BP 122/78 | Ht 63.5 in | Wt 116.6 lb

## 2017-11-09 DIAGNOSIS — D518 Other vitamin B12 deficiency anemias: Secondary | ICD-10-CM

## 2017-11-09 MED ORDER — CYANOCOBALAMIN 1000 MCG/ML IJ SOLN
1000.0000 ug | Freq: Once | INTRAMUSCULAR | Status: AC
  Administered 2017-11-09: 1000 ug via INTRAMUSCULAR

## 2017-11-09 NOTE — Progress Notes (Signed)
Patient here for a NV to get her B12 injection 0.5 ml [place] in LEFT upper arm.

## 2017-12-09 ENCOUNTER — Ambulatory Visit (INDEPENDENT_AMBULATORY_CARE_PROVIDER_SITE_OTHER): Payer: Medicare Other

## 2017-12-09 VITALS — BP 120/64 | Wt 117.0 lb

## 2017-12-09 DIAGNOSIS — D518 Other vitamin B12 deficiency anemias: Secondary | ICD-10-CM

## 2017-12-09 MED ORDER — CYANOCOBALAMIN 1000 MCG/ML IJ SOLN
1000.0000 ug | Freq: Once | INTRAMUSCULAR | Status: AC
  Administered 2017-12-09: 1000 ug via INTRAMUSCULAR

## 2017-12-09 NOTE — Progress Notes (Signed)
Patient here for a NV to get her B12 injection 0.5 ml [place] in RIGHT deltoid

## 2018-01-04 ENCOUNTER — Other Ambulatory Visit: Payer: Self-pay | Admitting: Internal Medicine

## 2018-01-06 ENCOUNTER — Ambulatory Visit (INDEPENDENT_AMBULATORY_CARE_PROVIDER_SITE_OTHER): Payer: Medicare Other

## 2018-01-06 DIAGNOSIS — E538 Deficiency of other specified B group vitamins: Secondary | ICD-10-CM

## 2018-01-06 MED ORDER — CYANOCOBALAMIN 1000 MCG/ML IJ SOLN
1000.0000 ug | Freq: Once | INTRAMUSCULAR | Status: AC
  Administered 2018-01-06: 1000 ug via INTRAMUSCULAR

## 2018-01-06 NOTE — Progress Notes (Signed)
Patient here for a NV to get her B12 injection 0.5 ml [place] inLEFTdeltoid

## 2018-02-02 ENCOUNTER — Other Ambulatory Visit: Payer: Self-pay | Admitting: Internal Medicine

## 2018-02-03 ENCOUNTER — Ambulatory Visit (INDEPENDENT_AMBULATORY_CARE_PROVIDER_SITE_OTHER): Payer: Medicare Other

## 2018-02-03 VITALS — BP 118/66 | Ht 63.5 in | Wt 117.0 lb

## 2018-02-03 DIAGNOSIS — D518 Other vitamin B12 deficiency anemias: Secondary | ICD-10-CM

## 2018-02-03 MED ORDER — OMEPRAZOLE 40 MG PO CPDR: DELAYED_RELEASE_CAPSULE | ORAL | 1 refills | Status: DC

## 2018-02-03 MED ORDER — AMITRIPTYLINE HCL 50 MG PO TABS: 50.0000 mg | ORAL_TABLET | Freq: Every day | ORAL | 1 refills | Status: DC

## 2018-02-03 MED ORDER — CYANOCOBALAMIN 1000 MCG/ML IJ SOLN
1000.0000 ug | Freq: Once | INTRAMUSCULAR | Status: AC
  Administered 2018-02-03: 1000 ug via INTRAMUSCULAR

## 2018-02-03 NOTE — Progress Notes (Signed)
Patient here for a NV to get her B12 injection 0.5 ml [place] inLEFTdeltoid Pt reports no concerns at this time.  

## 2018-02-04 ENCOUNTER — Ambulatory Visit: Payer: Self-pay

## 2018-02-15 ENCOUNTER — Encounter (INDEPENDENT_AMBULATORY_CARE_PROVIDER_SITE_OTHER): Payer: Medicare Other | Admitting: Ophthalmology

## 2018-02-15 DIAGNOSIS — H353122 Nonexudative age-related macular degeneration, left eye, intermediate dry stage: Secondary | ICD-10-CM

## 2018-02-15 DIAGNOSIS — I10 Essential (primary) hypertension: Secondary | ICD-10-CM | POA: Diagnosis not present

## 2018-02-15 DIAGNOSIS — H43813 Vitreous degeneration, bilateral: Secondary | ICD-10-CM

## 2018-02-15 DIAGNOSIS — H34831 Tributary (branch) retinal vein occlusion, right eye, with macular edema: Secondary | ICD-10-CM | POA: Diagnosis not present

## 2018-02-15 DIAGNOSIS — H35033 Hypertensive retinopathy, bilateral: Secondary | ICD-10-CM | POA: Diagnosis not present

## 2018-03-03 ENCOUNTER — Ambulatory Visit (INDEPENDENT_AMBULATORY_CARE_PROVIDER_SITE_OTHER): Payer: Medicare Other

## 2018-03-03 VITALS — BP 126/60 | Wt 113.0 lb

## 2018-03-03 DIAGNOSIS — D510 Vitamin B12 deficiency anemia due to intrinsic factor deficiency: Secondary | ICD-10-CM

## 2018-03-03 DIAGNOSIS — D518 Other vitamin B12 deficiency anemias: Secondary | ICD-10-CM | POA: Diagnosis not present

## 2018-03-03 MED ORDER — CYANOCOBALAMIN 1000 MCG/ML IJ SOLN
1000.0000 ug | Freq: Once | INTRAMUSCULAR | Status: AC
  Administered 2018-03-03: 1000 ug via INTRAMUSCULAR

## 2018-03-03 NOTE — Progress Notes (Signed)
Patient here for a NV to get her B12 injection 0.5 ml [place] inLEFTdeltoid Pt reports no concerns at this time.

## 2018-04-04 ENCOUNTER — Other Ambulatory Visit: Payer: Self-pay | Admitting: Physician Assistant

## 2018-04-07 ENCOUNTER — Ambulatory Visit (INDEPENDENT_AMBULATORY_CARE_PROVIDER_SITE_OTHER): Payer: Medicare Other

## 2018-04-07 VITALS — BP 116/68 | Ht 63.5 in | Wt 112.8 lb

## 2018-04-07 DIAGNOSIS — D510 Vitamin B12 deficiency anemia due to intrinsic factor deficiency: Secondary | ICD-10-CM | POA: Diagnosis not present

## 2018-04-07 MED ORDER — CYANOCOBALAMIN 1000 MCG/ML IJ SOLN
1000.0000 ug | Freq: Once | INTRAMUSCULAR | Status: AC
  Administered 2018-04-07: 1000 ug via INTRAMUSCULAR

## 2018-04-07 NOTE — Progress Notes (Signed)
Patient here for a NV to get her B12 injection 0.5 ml [place] inLEFTdeltoid Pt reports no concerns at this time.

## 2018-04-28 ENCOUNTER — Other Ambulatory Visit: Payer: Self-pay | Admitting: Internal Medicine

## 2018-04-28 DIAGNOSIS — E039 Hypothyroidism, unspecified: Secondary | ICD-10-CM

## 2018-04-29 ENCOUNTER — Other Ambulatory Visit: Payer: Self-pay | Admitting: Physician Assistant

## 2018-05-04 ENCOUNTER — Ambulatory Visit (INDEPENDENT_AMBULATORY_CARE_PROVIDER_SITE_OTHER): Payer: Medicare Other | Admitting: Internal Medicine

## 2018-05-04 ENCOUNTER — Encounter: Payer: Self-pay | Admitting: Internal Medicine

## 2018-05-04 ENCOUNTER — Ambulatory Visit: Payer: Self-pay

## 2018-05-04 VITALS — BP 136/72 | HR 84 | Temp 97.5°F | Resp 16 | Ht 63.5 in | Wt 115.0 lb

## 2018-05-04 DIAGNOSIS — Z79899 Other long term (current) drug therapy: Secondary | ICD-10-CM

## 2018-05-04 DIAGNOSIS — E039 Hypothyroidism, unspecified: Secondary | ICD-10-CM | POA: Diagnosis not present

## 2018-05-04 DIAGNOSIS — K219 Gastro-esophageal reflux disease without esophagitis: Secondary | ICD-10-CM | POA: Diagnosis not present

## 2018-05-04 DIAGNOSIS — I10 Essential (primary) hypertension: Secondary | ICD-10-CM

## 2018-05-04 DIAGNOSIS — D519 Vitamin B12 deficiency anemia, unspecified: Secondary | ICD-10-CM

## 2018-05-04 DIAGNOSIS — E559 Vitamin D deficiency, unspecified: Secondary | ICD-10-CM | POA: Diagnosis not present

## 2018-05-04 DIAGNOSIS — R7309 Other abnormal glucose: Secondary | ICD-10-CM | POA: Diagnosis not present

## 2018-05-04 DIAGNOSIS — E782 Mixed hyperlipidemia: Secondary | ICD-10-CM | POA: Diagnosis not present

## 2018-05-04 DIAGNOSIS — D510 Vitamin B12 deficiency anemia due to intrinsic factor deficiency: Secondary | ICD-10-CM | POA: Diagnosis not present

## 2018-05-04 MED ORDER — CYANOCOBALAMIN 1000 MCG/ML IJ SOLN
1000.0000 ug | Freq: Once | INTRAMUSCULAR | Status: AC
  Administered 2018-05-04: 1000 ug via INTRAMUSCULAR

## 2018-05-04 NOTE — Progress Notes (Signed)
This very nice 84 y.o. WWFpresents for 3 month follow up with HTN, HLD, Pre-Diabetes and Vitamin D Deficiency. Patient has GERD controlled on current meds. Patient is also followed by Dr Ashley RoyaltyMatthews for wet Macular Degeneration and has received intra-occular injections.      Patient is treated for HTN circa 1987 & BP has been controlled at home. Today's BP was initially slightly elevated & rechecked at goal - 136/72. Patient has had no complaints of any cardiac type chest pain, palpitations, dyspnea / orthopnea / PND, dizziness, claudication, or dependent edema.     Hyperlipidemia is controlled with diet. Last Lipids were at goal: Lab Results  Component Value Date   CHOL 151 09/07/2017   HDL 49 (L) 09/07/2017   LDLCALC 81 09/07/2017   TRIG 113 09/07/2017   CHOLHDL 3.1 09/07/2017      Also, the patient has history of PreDiabetes (A1c 5.7% in 2016)  and has had no symptoms of reactive hypoglycemia, diabetic polys, paresthesias or visual blurring.  Last A1c was Normal & at goal: Lab Results  Component Value Date   HGBA1C 5.5 05/04/2018      Patient has been on Thyroid Replacement since the 1990's. Further, the patient also has history of Vitamin D Deficiency("26" in 2016) and supplements vitamin D without any suspected side-effects. Last vitamin D was at goal:  Lab Results  Component Value Date   VD25OH 68 05/04/2018   Current Outpatient Medications on File Prior to Visit  Medication Sig  . ALPRAZolam (XANAX) 0.5 MG tablet Take 1/2 tablet at hour of sleep ONLY if needed  . amitriptyline (ELAVIL) 50 MG tablet Take 1 tablet (50 mg total) by mouth at bedtime.  Marland Kitchen. amLODipine (NORVASC) 2.5 MG tablet TAKE 1 TABLET BY MOUTH EVERY DAY  . Cholecalciferol (VITAMIN D-3) 5000 UNITS TABS Take 5,000 Units by mouth daily.  . cyanocobalamin (,VITAMIN B-12,) 1000 MCG/ML injection INJECT 1 MILLILITER INTO THE SKIN EVERY 30 DAYS  . levothyroxine (SYNTHROID, LEVOTHROID) 125 MCG tablet TAKE 1 TABLET BY  MOUTH BEFORE BREAKFAST OR AS DIRECTED.  Marland Kitchen. omeprazole (PRILOSEC) 40 MG capsule TAKE 1 CAPSULE BY MOUTH EVERY DAY   Current Facility-Administered Medications on File Prior to Visit  Medication  . cyanocobalamin ((VITAMIN B-12)) injection 1,000 mcg   Allergies  Allergen Reactions  . Amlodipine Swelling    Swelling/edema  . Norvasc [Amlodipine Besylate]   . Sulfa Antibiotics    PMHx:   Past Medical History:  Diagnosis Date  . Allergy   . Anemia   . Anxiety   . DDD (degenerative disc disease), lumbar   . Depression   . GERD (gastroesophageal reflux disease)   . Hyperlipidemia   . Hypertension   . OA (osteoarthritis) of knee    right knee  . Pre-diabetes   . Thyroid disease   . Vitamin D deficiency    Immunization History  Administered Date(s) Administered  . DTaP 05/20/2012  . Influenza Whole 07/21/2013  . Influenza, High Dose Seasonal PF 06/29/2014, 07/30/2015, 07/10/2016, 09/07/2017  . Pneumococcal Conjugate-13 05/29/2014  . Pneumococcal Polysaccharide-23 05/07/2010, 12/05/2014  . Zoster 11/03/2006   Past Surgical History:  Procedure Laterality Date  . CATARACT EXTRACTION     both  . KNEE ARTHROSCOPY Left    FHx:    Reviewed / unchanged  SHx:    Reviewed / unchanged   Systems Review:  Constitutional: Denies fever, chills, wt changes, headaches, insomnia, fatigue, night sweats, change in appetite. Eyes: Denies redness, blurred  vision, diplopia, discharge, itchy, watery eyes.  ENT: Denies discharge, congestion, post nasal drip, epistaxis, sore throat, earache, hearing loss, dental pain, tinnitus, vertigo, sinus pain, snoring.  CV: Denies chest pain, palpitations, irregular heartbeat, syncope, dyspnea, diaphoresis, orthopnea, PND, claudication or edema. Respiratory: denies cough, dyspnea, DOE, pleurisy, hoarseness, laryngitis, wheezing.  Gastrointestinal: Denies dysphagia, odynophagia, heartburn, reflux, water brash, abdominal pain or cramps, nausea, vomiting,  bloating, diarrhea, constipation, hematemesis, melena, hematochezia  or hemorrhoids. Genitourinary: Denies dysuria, frequency, urgency, nocturia, hesitancy, discharge, hematuria or flank pain. Musculoskeletal: Denies arthralgias, myalgias, stiffness, jt. swelling, pain, limping or strain/sprain.  Skin: Denies pruritus, rash, hives, warts, acne, eczema or change in skin lesion(s). Neuro: No weakness, tremor, incoordination, spasms, paresthesia or pain. Psychiatric: Denies confusion, memory loss or sensory loss. Endo: Denies change in weight, skin or hair change.  Heme/Lymph: No excessive bleeding, bruising or enlarged lymph nodes.  Physical Exam  BP 136/72   Pulse 84   Temp (!) 97.5 F (36.4 C)   Resp 16   Ht 5' 3.5" (1.613 m)   Wt 115 lb (52.2 kg)   BMI 20.05 kg/m   Appears  well nourished, well groomed  and in no distress.  Eyes: PERRLA, EOMs, conjunctiva no swelling or erythema. Sinuses: No frontal/maxillary tenderness ENT/Mouth: EAC's clear, TM's nl w/o erythema, bulging. Nares clear w/o erythema, swelling, exudates. Oropharynx clear without erythema or exudates. Oral hygiene is good. Tongue normal, non obstructing. Hearing intact.  Neck: Supple. Thyroid not palpable. Car 2+/2+ without bruits, nodes or JVD. Chest: Respirations nl with BS clear & equal w/o rales, rhonchi, wheezing or stridor.  Cor: Heart sounds normal w/ regular rate and rhythm without sig. murmurs, gallops, clicks or rubs. Peripheral pulses normal and equal  without edema.  Abdomen: Soft & bowel sounds normal. Non-tender w/o guarding, rebound, hernias, masses or organomegaly.  Lymphatics: Unremarkable.  Musculoskeletal: Full ROM all peripheral extremities, joint stability, 5/5 strength and normal gait.  Skin: Warm, dry without exposed rashes, lesions or ecchymosis apparent.  Neuro: Cranial nerves intact, reflexes equal bilaterally. Sensory-motor testing grossly intact. Tendon reflexes grossly intact.  Pysch:  Alert & oriented x 3.  Insight and judgement nl & appropriate. No ideations.  Assessment and Plan:  1. Essential hypertension  - Continue medication, monitor blood pressure at home.  - Continue DASH diet.  Reminder to go to the ER if any CP,  SOB, nausea, dizziness, severe HA, changes vision/speech.  - CBC with Differential/Platelet - COMPLETE METABOLIC PANEL WITH GFR - Magnesium - TSH  2. Hyperlipidemia, mixed  - Continue diet/meds, exercise,& lifestyle modifications.  - Continue monitor periodic cholesterol/liver & renal functions   - TSH  3. Abnormal glucose  - Continue diet, exercise, lifestyle modifications.  - Monitor appropriate labs.  - Hemoglobin A1c - Insulin, random  4. Vitamin D deficiency  - Continue supplementation.  - VITAMIN D 25 Hydroxyl  5. Hypothyroidism  - TSH  6. Gastroesophageal reflux disease   7. Vitamin B12 deficiency anemia due to intrinsic factor deficiency  - VITAMIN B-12 injection 1,000 mcg - CBC with Differential/Platelet  8. Medication management  - CBC with Differential/Platelet - COMPLETE METABOLIC PANEL WITH GFR - Magnesium - TSH - Hemoglobin A1c - Insulin, random - VITAMIN D 25 Hydroxy (Vit-D Deficiency, Fractures)       Discussed  regular exercise, BP monitoring, weight control to achieve/maintain BMI less than 25 and discussed med and SE's. Recommended labs to assess and monitor clinical status with further disposition pending results of labs. Over  30 minutes of exam, counseling, chart review was performed.

## 2018-05-04 NOTE — Patient Instructions (Signed)

## 2018-05-05 ENCOUNTER — Ambulatory Visit: Payer: Self-pay

## 2018-05-05 LAB — CBC WITH DIFFERENTIAL/PLATELET
BASOS ABS: 28 {cells}/uL (ref 0–200)
BASOS PCT: 0.5 %
EOS ABS: 50 {cells}/uL (ref 15–500)
Eosinophils Relative: 0.9 %
HCT: 29.9 % — ABNORMAL LOW (ref 35.0–45.0)
HEMOGLOBIN: 9.8 g/dL — AB (ref 11.7–15.5)
LYMPHS ABS: 1439 {cells}/uL (ref 850–3900)
MCH: 29.1 pg (ref 27.0–33.0)
MCHC: 32.8 g/dL (ref 32.0–36.0)
MCV: 88.7 fL (ref 80.0–100.0)
MPV: 9.4 fL (ref 7.5–12.5)
Monocytes Relative: 7.9 %
NEUTROS ABS: 3640 {cells}/uL (ref 1500–7800)
Neutrophils Relative %: 65 %
Platelets: 214 10*3/uL (ref 140–400)
RBC: 3.37 10*6/uL — ABNORMAL LOW (ref 3.80–5.10)
RDW: 13.2 % (ref 11.0–15.0)
Total Lymphocyte: 25.7 %
WBC mixed population: 442 cells/uL (ref 200–950)
WBC: 5.6 10*3/uL (ref 3.8–10.8)

## 2018-05-05 LAB — COMPLETE METABOLIC PANEL WITH GFR
AG RATIO: 1.8 (calc) (ref 1.0–2.5)
ALT: 9 U/L (ref 6–29)
AST: 22 U/L (ref 10–35)
Albumin: 4 g/dL (ref 3.6–5.1)
Alkaline phosphatase (APISO): 71 U/L (ref 33–130)
BUN/Creatinine Ratio: 16 (calc) (ref 6–22)
BUN: 16 mg/dL (ref 7–25)
CALCIUM: 9.5 mg/dL (ref 8.6–10.4)
CO2: 27 mmol/L (ref 20–32)
Chloride: 106 mmol/L (ref 98–110)
Creat: 1.02 mg/dL — ABNORMAL HIGH (ref 0.60–0.88)
GFR, EST AFRICAN AMERICAN: 57 mL/min/{1.73_m2} — AB (ref >=60)
GFR, EST NON AFRICAN AMERICAN: 49 mL/min/{1.73_m2} — AB (ref >=60)
Globulin: 2.2 g/dL (calc) (ref 1.9–3.7)
Glucose, Bld: 94 mg/dL (ref 65–99)
POTASSIUM: 5.8 mmol/L — AB (ref 3.5–5.3)
Sodium: 139 mmol/L (ref 135–146)
Total Bilirubin: 0.4 mg/dL (ref 0.2–1.2)
Total Protein: 6.2 g/dL (ref 6.1–8.1)

## 2018-05-05 LAB — MAGNESIUM: Magnesium: 1.9 mg/dL (ref 1.5–2.5)

## 2018-05-05 LAB — HEMOGLOBIN A1C
Hgb A1c MFr Bld: 5.5 % of total Hgb (ref <5.7)
Mean Plasma Glucose: 111 (calc)
eAG (mmol/L): 6.2 (calc)

## 2018-05-05 LAB — TSH: TSH: 1.94 mIU/L (ref 0.40–4.50)

## 2018-05-05 LAB — INSULIN, RANDOM: Insulin: 1.8 u[IU]/mL — ABNORMAL LOW (ref 2.0–19.6)

## 2018-05-05 LAB — VITAMIN D 25 HYDROXY (VIT D DEFICIENCY, FRACTURES): VIT D 25 HYDROXY: 68 ng/mL (ref 30–100)

## 2018-05-08 ENCOUNTER — Encounter: Payer: Self-pay | Admitting: Internal Medicine

## 2018-06-07 ENCOUNTER — Ambulatory Visit (INDEPENDENT_AMBULATORY_CARE_PROVIDER_SITE_OTHER): Payer: Medicare Other

## 2018-06-07 VITALS — BP 118/64 | Ht 63.5 in | Wt 112.0 lb

## 2018-06-07 DIAGNOSIS — D518 Other vitamin B12 deficiency anemias: Secondary | ICD-10-CM | POA: Diagnosis not present

## 2018-06-07 MED ORDER — CYANOCOBALAMIN 1000 MCG/ML IJ SOLN: 1000.0000 ug | Freq: Once | INTRAMUSCULAR | Status: DC

## 2018-06-07 NOTE — Progress Notes (Signed)
PT REPORTS FOR B12 INJECTION, WHICH WAS GIVEN IN LEFT ARM W/O CONCERNS. PT'S WT WAS 112.0LB & B/P WAS 118/64.

## 2018-06-28 ENCOUNTER — Encounter (INDEPENDENT_AMBULATORY_CARE_PROVIDER_SITE_OTHER): Payer: Medicare Other | Admitting: Ophthalmology

## 2018-06-28 DIAGNOSIS — H43813 Vitreous degeneration, bilateral: Secondary | ICD-10-CM | POA: Diagnosis not present

## 2018-06-28 DIAGNOSIS — H353122 Nonexudative age-related macular degeneration, left eye, intermediate dry stage: Secondary | ICD-10-CM

## 2018-06-28 DIAGNOSIS — I10 Essential (primary) hypertension: Secondary | ICD-10-CM

## 2018-06-28 DIAGNOSIS — H34831 Tributary (branch) retinal vein occlusion, right eye, with macular edema: Secondary | ICD-10-CM | POA: Diagnosis not present

## 2018-06-28 DIAGNOSIS — H35033 Hypertensive retinopathy, bilateral: Secondary | ICD-10-CM

## 2018-07-06 ENCOUNTER — Ambulatory Visit (INDEPENDENT_AMBULATORY_CARE_PROVIDER_SITE_OTHER): Payer: Medicare Other

## 2018-07-06 VITALS — BP 126/60 | Wt 114.8 lb

## 2018-07-06 DIAGNOSIS — D518 Other vitamin B12 deficiency anemias: Secondary | ICD-10-CM

## 2018-07-06 MED ORDER — CYANOCOBALAMIN 1000 MCG/ML IJ SOLN
1000.0000 ug | Freq: Once | INTRAMUSCULAR | Status: AC
  Administered 2018-07-06: 1000 ug via INTRAMUSCULAR

## 2018-07-06 NOTE — Progress Notes (Signed)
Patient here for a NV to get her B12 injection 0.5 ml [place] inleftdeltoid. Pt reports no concerns at this time. Wt today was 114.8lbs and BP was 126/60

## 2018-07-08 ENCOUNTER — Ambulatory Visit: Payer: Self-pay

## 2018-08-01 ENCOUNTER — Other Ambulatory Visit: Payer: Self-pay | Admitting: Internal Medicine

## 2018-08-09 ENCOUNTER — Ambulatory Visit (INDEPENDENT_AMBULATORY_CARE_PROVIDER_SITE_OTHER): Payer: Medicare Other

## 2018-08-09 DIAGNOSIS — D518 Other vitamin B12 deficiency anemias: Secondary | ICD-10-CM | POA: Diagnosis not present

## 2018-08-09 MED ORDER — CYANOCOBALAMIN 1000 MCG/ML IJ SOLN
1000.0000 ug | Freq: Once | INTRAMUSCULAR | Status: AC
  Administered 2018-08-09: 1000 ug via INTRAMUSCULAR

## 2018-08-09 NOTE — Progress Notes (Signed)
Patient here for a NV to get her B12 injection 0.5 ml [place] inRIGHTdeltoid. Pt reports no concerns at this time. Wt today was 113lb and BP was 116/70

## 2018-08-10 DIAGNOSIS — Z23 Encounter for immunization: Secondary | ICD-10-CM | POA: Diagnosis not present

## 2018-09-06 NOTE — Progress Notes (Signed)
MEDICARE ANNUAL WELLNESS VISIT AND CPE  Assessment:   Essential hypertension - continue medications, DASH diet, exercise and monitor at home. Call if greater than 130/80.  -     CBC with Differential/Platelet -     BASIC METABOLIC PANEL WITH GFR -     Hepatic function panel -     TSH -     EKG 12-Lead -     Urinalysis, Routine w reflex microscopic -     Microalbumin / creatinine urine ratio  Chronic obstructive pulmonary disease, unspecified COPD type (HCC) Avoid triggers, continue allergy pill  Mixed hyperlipidemia -decrease fatty foods, increase activity.  -     Lipid panel  Hypothyroidism, unspecified type Hypothyroidism-check TSH level, continue medications the same, reminded to take on an empty stomach 30-33mns before food.  -     TSH  Malnutrition of mild degree (HCC) Continue protein/weight gain/ensure -     Hepatic function panel  Other abnormal glucose Monitor  Medication management -     Magnesium  Anemia due to vitamin B12 deficiency, unspecified B12 deficiency type Continue injections  Vitamin D deficiency Continue supplement  Anemia, unspecified type Monitor  Macular degeneration, unspecified laterality, unspecified type Continue follow up Dr. MZigmund Daniel Gastroesophageal reflux disease, esophagitis presence not specified Continue PPI/H2 blocker, diet discussed  Encounter for Medicare annual wellness exam 1 year  BMI 19 Get on ensure  Over 40 minutes of exam, counseling, chart review and critical decision making was performed Future Appointments  Date Time Provider DBritton 11/08/2018 12:30 PM MHayden Pedro MD [facility] None     Plan:   During the course of the visit the patient was educated and counseled about appropriate screening and preventive services including:    Pneumococcal vaccine   Prevnar 13  Influenza vaccine  Td vaccine  Screening electrocardiogram  Bone densitometry screening  Colorectal cancer  screening  Diabetes screening  Glaucoma screening  Nutrition counseling   Advanced directives: requested   Subjective:  Shelby CZERWONKAis a 84y.o. female who presents for Medicare Annual Wellness Visit and complete physical.    She complains of arthritis in her bilateral knees and hands but states it is not enough to take tylenol.   Her blood pressure has been controlled at home, today their BP is BP: 122/72  She does not workout. She denies chest pain, shortness of breath, dizziness.  She is not on cholesterol medication and denies myalgias. Her cholesterol is at goal. The cholesterol last visit was:   Lab Results  Component Value Date   CHOL 151 09/07/2017   HDL 49 (L) 09/07/2017   LDLCALC 81 09/07/2017   TRIG 113 09/07/2017   CHOLHDL 3.1 09/07/2017    She has been working on diet and exercise for prediabetes, and denies paresthesia of the feet, polydipsia, polyuria and visual disturbances. Last A1C in the office was:  Lab Results  Component Value Date   HGBA1C 5.5 05/04/2018   Last GFR: Lab Results  Component Value Date   GFRNONAA 49 (L) 05/04/2018   Patient is on Vitamin D supplement.   Lab Results  Component Value Date   VD25OH 68 05/04/2018     She is on thyroid medication. Her medication was changed last visit, she is on 1 pill Tuesday and thursday and 1/2 pill the rest of the week, she takes the prilosec after 4 hours. She feels anxious sometime but no other symptoms of hyperthyroidism however when she takes it she  feels dizzy after taking it, she does not eat with it.   Lab Results  Component Value Date   TSH 1.94 05/04/2018  .  She has chronic hearing loss in left ear from mastoid sx when she was younger, wears bilateral hearing aids.  Follows up with Dr. Katy Fitch regularly, and he sent her to Dr. Rodman Key for mac degeneration, she gets injections,, she does still drive but does not drive at night.   She takes amitriptyline 1/2 pill at night for sleep.  She  will take xanax occ during the day for anxiety.  BMI is Body mass index is 19.56 kg/m., she has lost some weight, she has decreased appetite.  Wt Readings from Last 3 Encounters:  09/07/18 112 lb 3.2 oz (50.9 kg)  07/06/18 114 lb 12.8 oz (52.1 kg)  06/07/18 112 lb (50.8 kg)    Medication Review: Current Outpatient Medications on File Prior to Visit  Medication Sig Dispense Refill  . ALPRAZolam (XANAX) 0.5 MG tablet Take 1/2 tablet at hour of sleep ONLY if needed 45 tablet 0  . amitriptyline (ELAVIL) 50 MG tablet Take 1 tablet (50 mg total) by mouth at bedtime. 90 tablet 1  . amLODipine (NORVASC) 2.5 MG tablet TAKE 1 TABLET BY MOUTH EVERY DAY 90 tablet 2  . Cholecalciferol (VITAMIN D-3) 5000 UNITS TABS Take 5,000 Units by mouth daily.    . cyanocobalamin (,VITAMIN B-12,) 1000 MCG/ML injection INJECT 1 MILLILITER INTO THE SKIN EVERY 30 DAYS 3 mL 2  . levothyroxine (SYNTHROID, LEVOTHROID) 125 MCG tablet TAKE 1 TABLET BY MOUTH BEFORE BREAKFAST OR AS DIRECTED. 90 tablet 0  . omeprazole (PRILOSEC) 40 MG capsule TAKE 1 CAPSULE BY MOUTH EVERY DAY 90 capsule 1   Current Facility-Administered Medications on File Prior to Visit  Medication Dose Route Frequency Provider Last Rate Last Dose  . cyanocobalamin ((VITAMIN B-12)) injection 1,000 mcg  1,000 mcg Intramuscular Once Vicie Mutters, PA-C      . cyanocobalamin ((VITAMIN B-12)) injection 1,000 mcg  1,000 mcg Intramuscular Once Unk Pinto, MD        Allergies  Allergen Reactions  . Amlodipine Swelling    Swelling/edema  . Norvasc [Amlodipine Besylate]   . Sulfa Antibiotics     Current Problems (verified) Patient Active Problem List   Diagnosis Date Noted  . Malnutrition of mild degree ([place]) 11/18/2016  . COPD (chronic obstructive pulmonary disease) ([place]) 07/10/2016  . Encounter for Medicare annual wellness exam 10/02/2015  . Hypothyroidism 06/28/2015  . Macular degeneration 06/28/2015  . Lower back pain 06/28/2015  .  Medication management 12/05/2014  . Hypertension   . Hyperlipidemia, mixed   . B12 deficiency anemia   . Anemia   . Gastroesophageal reflux disease   . Vitamin D deficiency   . Abnormal glucose     Screening Tests Immunization History  Administered Date(s) Administered  . DTaP 05/20/2012  . Influenza Whole 07/21/2013  . Influenza, High Dose Seasonal PF 06/29/2014, 07/30/2015, 07/10/2016, 09/07/2017  . Pneumococcal Conjugate-13 05/29/2014  . Pneumococcal Polysaccharide-23 05/07/2010, 12/05/2014  . Zoster 11/03/2006   Preventative care: Last colonoscopy: 2010 will not get another Last mammogram: 2009 declines another Last pap smear/pelvic exam: remote DEXA: 2013 osteoporosis, declines another  Prior vaccinations: TD or Tdap: 2013  Influenza: 2018 DUE TODAY Pneumococcal: 2016 Prevnar13: 2015 Shingles/Zostavax: 2008  Names of Other Physician/Practitioners you currently use: 1. [facility] here for primary care 2. Dr. Katy Fitch, eye doctor, once a year 3. Ronnell Freshwater, dentist,  once a year 4. Dr. Zigmund Daniel, treated MD, June 15 2017 Patient Care Team: Unk Pinto, MD as PCP - General (Internal Medicine) Laurence Spates, MD as Consulting Physician (Gastroenterology) Berenice Primas, MD as Referring Physician (Orthopedic Surgery) Warden Fillers, MD as Consulting Physician (Optometry)  SURGICAL HISTORY She  has a past surgical history that includes Knee arthroscopy (Left) and Cataract extraction. FAMILY HISTORY Her family history includes COPD in her father; Heart attack in her brother and brother; Hypertension in her mother. SOCIAL HISTORY She  reports that she has never smoked. She has never used smokeless tobacco. She reports that she does not drink alcohol or use drugs.   MEDICARE WELLNESS OBJECTIVES: Physical activity: Current Exercise Habits: The patient does not participate in regular exercise at present(she stays  busy) Cardiac risk factors: Cardiac Risk Factors include: advanced age (>41mn, >>66women);dyslipidemia;hypertension;sedentary lifestyle Depression/mood screen:   [facility]07/2018  Decreased Interest 0  Down, Depressed, Hopeless 0  PHQ - 2 Score 0    ADLs:  In your present state of health, do you have any difficulty performing the following activities: 09/07/2018 05/08/2018  Hearing? Y N  Vision? N N  Comment gets injection for mac degen -  Difficulty concentrating or making decisions? N N  Walking or climbing stairs? N N  Comment she is able to get up on the exam table without assitance -  Dressing or bathing? N N  Doing errands, shopping? N N  Comment does not drive at night -  Preparing Food and eating ? N -  Using the Toilet? Y -  In the past six months, have you accidently leaked urine? Y -  Do you have problems with loss of bowel control? N -  Managing your Medications? N -  Managing your Finances? N -  Housekeeping or managing your Housekeeping? N -  Some recent data might be hidden    Cognitive Testing  Alert? Yes  Normal Appearance?Yes  Oriented to person? Yes  Place? Yes   Time? Yes  Recall of three objects?  2/3  Can perform simple calculations? Yes  Displays appropriate judgment?Yes  Can read the correct time from a watch face?Yes  EOL planning: Does Patient Have a Medical Advance Directive?: Yes Type of Advance Directive: Healthcare Power of Attorney, Living will Does patient want to make changes to medical advance directive?: No - Patient declined  Review of Systems  Constitutional: Negative for chills, fever and malaise/fatigue.  HENT: Negative for congestion, ear pain and sore throat.   Eyes: Negative.   Respiratory: Negative for cough, shortness of breath and wheezing.   Cardiovascular: Negative for chest pain, palpitations and leg swelling.  Gastrointestinal: Negative for abdominal pain, blood in stool, constipation, diarrhea, heartburn  and melena.  Genitourinary: Negative.   Skin: Negative.   Neurological: Negative for dizziness, sensory change, loss of consciousness and headaches.  Psychiatric/Behavioral: Negative for depression. The patient is not nervous/anxious and does not have insomnia.      Objective:     Today's Vitals   09/07/18 1131  BP: 122/72  Pulse: 66  Resp: 16  Temp: 98 F (36.7 C)  SpO2: 96%  Weight: 112 lb 3.2 oz (50.9 kg)  Height: 5' 3.5" (1.613 m)   Body mass index is 19.56 kg/m.  General appearance: alert, no distress, WD/WN,  female HEENT: Left eye ptosis, normocephalic, sclerae anicteric, TM pearly on left, right TM white/scarred or absent TM, nares patent, no discharge or erythema, pharynx normal Oral cavity:  MMM, no lesions Neck: supple, no lymphadenopathy, no thyromegaly, no masses Heart: RRR, normal S1, S2, no murmurs Lungs: CTA bilaterally, no wheezes, rhonchi, or rales Abdomen: +bs, soft, non tender, non distended, no masses, no hepatomegaly, no splenomegaly Musculoskeletal: nontender, no swelling, no obvious deformity, mild- moderate cervical kyphosis Extremities: no edema, no cyanosis, no clubbing Pulses: 2+ symmetric, upper and lower extremities, normal cap refill Neurological: alert, oriented x 3, CN2-12 intact, strength normal upper extremities and lower extremities, sensation normal throughout, DTRs 2+ throughout, no cerebellar signs, gait normal Psychiatric: normal affect, behavior normal, pleasant  Breast: nontender, no masses or lumps, no skin changes, no nipple discharge or inversion, no axillary lymphadenopathy  Medicare Attestation I have personally reviewed: The patient's medical and social history Their use of alcohol, tobacco or illicit drugs Their current medications and supplements The patient's functional ability including ADLs,fall risks, home safety risks, cognitive, and hearing and visual impairment Diet and physical activities Evidence for depression or  mood disorders  The patient's weight, height, BMI, and visual acuity have been recorded in the chart.  I have made referrals, counseling, and provided education to the patient based on review of the above and I have provided the patient with a written personalized care plan for preventive services.     Vicie Mutters, PA-C   09/07/2018

## 2018-09-07 ENCOUNTER — Encounter: Payer: Self-pay | Admitting: Physician Assistant

## 2018-09-07 ENCOUNTER — Ambulatory Visit (INDEPENDENT_AMBULATORY_CARE_PROVIDER_SITE_OTHER): Payer: Medicare Other | Admitting: Physician Assistant

## 2018-09-07 VITALS — BP 122/72 | HR 66 | Temp 98.0°F | Resp 16 | Ht 63.5 in | Wt 112.2 lb

## 2018-09-07 DIAGNOSIS — K219 Gastro-esophageal reflux disease without esophagitis: Secondary | ICD-10-CM

## 2018-09-07 DIAGNOSIS — M545 Low back pain, unspecified: Secondary | ICD-10-CM

## 2018-09-07 DIAGNOSIS — I10 Essential (primary) hypertension: Secondary | ICD-10-CM | POA: Diagnosis not present

## 2018-09-07 DIAGNOSIS — R6889 Other general symptoms and signs: Secondary | ICD-10-CM

## 2018-09-07 DIAGNOSIS — D508 Other iron deficiency anemias: Secondary | ICD-10-CM

## 2018-09-07 DIAGNOSIS — E039 Hypothyroidism, unspecified: Secondary | ICD-10-CM

## 2018-09-07 DIAGNOSIS — Z79899 Other long term (current) drug therapy: Secondary | ICD-10-CM | POA: Diagnosis not present

## 2018-09-07 DIAGNOSIS — E441 Mild protein-calorie malnutrition: Secondary | ICD-10-CM | POA: Diagnosis not present

## 2018-09-07 DIAGNOSIS — Z136 Encounter for screening for cardiovascular disorders: Secondary | ICD-10-CM | POA: Diagnosis not present

## 2018-09-07 DIAGNOSIS — R7309 Other abnormal glucose: Secondary | ICD-10-CM | POA: Diagnosis not present

## 2018-09-07 DIAGNOSIS — Z0001 Encounter for general adult medical examination with abnormal findings: Secondary | ICD-10-CM | POA: Diagnosis not present

## 2018-09-07 DIAGNOSIS — E782 Mixed hyperlipidemia: Secondary | ICD-10-CM | POA: Diagnosis not present

## 2018-09-07 DIAGNOSIS — J449 Chronic obstructive pulmonary disease, unspecified: Secondary | ICD-10-CM

## 2018-09-07 DIAGNOSIS — H3530 Unspecified macular degeneration: Secondary | ICD-10-CM

## 2018-09-07 DIAGNOSIS — D518 Other vitamin B12 deficiency anemias: Secondary | ICD-10-CM | POA: Diagnosis not present

## 2018-09-07 DIAGNOSIS — Z0000 Encounter for general adult medical examination without abnormal findings: Secondary | ICD-10-CM

## 2018-09-07 DIAGNOSIS — E559 Vitamin D deficiency, unspecified: Secondary | ICD-10-CM

## 2018-09-07 MED ORDER — CYANOCOBALAMIN 1000 MCG/ML IJ SOLN
1000.0000 ug | Freq: Once | INTRAMUSCULAR | Status: AC
  Administered 2018-09-07: 1000 ug via INTRAMUSCULAR

## 2018-09-07 NOTE — Patient Instructions (Addendum)
Please try to eat with the thyroid pill and see if this helps with the dizziness We will check your thyroid today and see how you are doing.   TUMERIC FOR INFLAMMATION  Tumeric with black pepper extract is a great natural antiinflammatory that helps with arthritis and aches and pain. Can get from [employer] or any health food store. Need to take at least 800mg  twice a day with food TO AVOID HEART BURN.   MAXIMUM AMOUNT OF TYLENOL IN A DAY  You can take tylenol (500mg ) or tylenol arthritis (650mg ) with the meloxicam/antiinflammatories. The max you can take of tylenol a day is 3000mg  daily, this is a max of 6 pills a day of the regular tyelnol (500mg ) or a max of 4 a day of the tylenol arthritis (650mg ) as long as no other medications you are taking contain tylenol.    WEIGHT GAIN Continue ensure/boost for your weight Can be more liberal with what you eat  Try to make sure you are eating plenty of high calorie dense foods like: Avocado Nuts Peanut butter Oatmeal Dates Cottage cheese Greek yogurt Protein powder

## 2018-09-08 LAB — COMPLETE METABOLIC PANEL WITH GFR
AG RATIO: 1.8 (calc) (ref 1.0–2.5)
ALT: 10 U/L (ref 6–29)
AST: 22 U/L (ref 10–35)
Albumin: 4 g/dL (ref 3.6–5.1)
Alkaline phosphatase (APISO): 62 U/L (ref 33–130)
BILIRUBIN TOTAL: 0.4 mg/dL (ref 0.2–1.2)
BUN / CREAT RATIO: 17 (calc) (ref 6–22)
BUN: 19 mg/dL (ref 7–25)
CALCIUM: 9.3 mg/dL (ref 8.6–10.4)
CHLORIDE: 104 mmol/L (ref 98–110)
CO2: 22 mmol/L (ref 20–32)
Creat: 1.09 mg/dL — ABNORMAL HIGH (ref 0.60–0.88)
GFR, EST AFRICAN AMERICAN: 52 mL/min/{1.73_m2} — AB (ref >=60)
GFR, Est Non African American: 45 mL/min/{1.73_m2} — ABNORMAL LOW (ref >=60)
Globulin: 2.2 g/dL (calc) (ref 1.9–3.7)
Glucose, Bld: 91 mg/dL (ref 65–99)
Potassium: 4.3 mmol/L (ref 3.5–5.3)
SODIUM: 134 mmol/L — AB (ref 135–146)
TOTAL PROTEIN: 6.2 g/dL (ref 6.1–8.1)

## 2018-09-08 LAB — CBC WITH DIFFERENTIAL/PLATELET
BASOS PCT: 0.7 %
Basophils Absolute: 39 cells/uL (ref 0–200)
Eosinophils Absolute: 28 cells/uL (ref 15–500)
Eosinophils Relative: 0.5 %
HCT: 29 % — ABNORMAL LOW (ref 35.0–45.0)
HEMOGLOBIN: 9.6 g/dL — AB (ref 11.7–15.5)
LYMPHS ABS: 1265 {cells}/uL (ref 850–3900)
MCH: 29.3 pg (ref 27.0–33.0)
MCHC: 33.1 g/dL (ref 32.0–36.0)
MCV: 88.4 fL (ref 80.0–100.0)
MPV: 9.2 fL (ref 7.5–12.5)
Monocytes Relative: 7.6 %
NEUTROS ABS: 3751 {cells}/uL (ref 1500–7800)
Neutrophils Relative %: 68.2 %
PLATELETS: 213 10*3/uL (ref 140–400)
RBC: 3.28 10*6/uL — AB (ref 3.80–5.10)
RDW: 13.2 % (ref 11.0–15.0)
TOTAL LYMPHOCYTE: 23 %
WBC: 5.5 10*3/uL (ref 3.8–10.8)
WBCMIX: 418 {cells}/uL (ref 200–950)

## 2018-09-08 LAB — URINALYSIS, ROUTINE W REFLEX MICROSCOPIC
Bilirubin Urine: NEGATIVE
GLUCOSE, UA: NEGATIVE
HGB URINE DIPSTICK: NEGATIVE
Ketones, ur: NEGATIVE
Leukocytes, UA: NEGATIVE
Nitrite: NEGATIVE
PROTEIN: NEGATIVE
Specific Gravity, Urine: 1.009 (ref 1.001–1.03)
pH: 5.5 (ref 5.0–8.0)

## 2018-09-08 LAB — IRON, TOTAL/TOTAL IRON BINDING CAP
%SAT: 24 % (calc) (ref 16–45)
Iron: 60 ug/dL (ref 45–160)
TIBC: 255 mcg/dL (calc) (ref 250–450)

## 2018-09-08 LAB — LIPID PANEL
Cholesterol: 163 mg/dL (ref <200)
HDL: 56 mg/dL (ref >50)
LDL Cholesterol (Calc): 85 mg/dL (calc)
NON-HDL CHOLESTEROL (CALC): 107 mg/dL (ref <130)
TRIGLYCERIDES: 127 mg/dL (ref <150)
Total CHOL/HDL Ratio: 2.9 (calc) (ref <5.0)

## 2018-09-08 LAB — FERRITIN: Ferritin: 50 ng/mL (ref 16–288)

## 2018-09-08 LAB — TSH: TSH: 1.35 mIU/L (ref 0.40–4.50)

## 2018-09-08 LAB — MAGNESIUM: MAGNESIUM: 1.7 mg/dL (ref 1.5–2.5)

## 2018-09-08 LAB — MICROALBUMIN / CREATININE URINE RATIO
CREATININE, URINE: 48 mg/dL (ref 20–275)
Microalb Creat Ratio: 27 mcg/mg creat (ref <30)
Microalb, Ur: 1.3 mg/dL

## 2018-10-06 ENCOUNTER — Ambulatory Visit (INDEPENDENT_AMBULATORY_CARE_PROVIDER_SITE_OTHER): Payer: Medicare Other

## 2018-10-06 VITALS — BP 122/64 | HR 85 | Temp 97.9°F | Ht 63.5 in | Wt 113.4 lb

## 2018-10-06 DIAGNOSIS — D518 Other vitamin B12 deficiency anemias: Secondary | ICD-10-CM | POA: Diagnosis not present

## 2018-10-06 MED ORDER — CYANOCOBALAMIN 1000 MCG/ML IJ SOLN
1000.0000 ug | Freq: Once | INTRAMUSCULAR | Status: AC
  Administered 2018-10-06: 1000 ug via INTRAMUSCULAR

## 2018-10-06 NOTE — Progress Notes (Signed)
Patient here for a NV to get her B12 injection 0.5 ml [place] inLEFT deltoid.Pt reports no concerns at this time. Vitals taken and recorded.

## 2018-10-26 ENCOUNTER — Other Ambulatory Visit: Payer: Self-pay | Admitting: Internal Medicine

## 2018-11-01 ENCOUNTER — Ambulatory Visit (INDEPENDENT_AMBULATORY_CARE_PROVIDER_SITE_OTHER): Payer: Medicare Other

## 2018-11-01 VITALS — BP 136/60 | HR 80 | Temp 98.6°F | Ht 63.5 in | Wt 111.6 lb

## 2018-11-01 DIAGNOSIS — D518 Other vitamin B12 deficiency anemias: Secondary | ICD-10-CM

## 2018-11-01 DIAGNOSIS — E039 Hypothyroidism, unspecified: Secondary | ICD-10-CM

## 2018-11-01 MED ORDER — CYANOCOBALAMIN 1000 MCG/ML IJ SOLN
1000.0000 ug | Freq: Once | INTRAMUSCULAR | Status: AC
  Administered 2018-11-01: 1000 ug via INTRAMUSCULAR

## 2018-11-01 MED ORDER — CYANOCOBALAMIN 1000 MCG/ML IJ SOLN: INTRAMUSCULAR | 2 refills | Status: DC

## 2018-11-01 NOTE — Progress Notes (Signed)
Patient here for a NV to get her B12 injection 0.5 ml [place] inRIGHT deltoid.Pt reports no concerns at this time. Vitals taken and recorded

## 2018-11-08 ENCOUNTER — Encounter (INDEPENDENT_AMBULATORY_CARE_PROVIDER_SITE_OTHER): Payer: Medicare Other | Admitting: Ophthalmology

## 2018-11-08 ENCOUNTER — Ambulatory Visit: Payer: Self-pay

## 2018-11-08 DIAGNOSIS — H353122 Nonexudative age-related macular degeneration, left eye, intermediate dry stage: Secondary | ICD-10-CM

## 2018-11-08 DIAGNOSIS — H43813 Vitreous degeneration, bilateral: Secondary | ICD-10-CM

## 2018-11-08 DIAGNOSIS — H34831 Tributary (branch) retinal vein occlusion, right eye, with macular edema: Secondary | ICD-10-CM

## 2018-11-08 DIAGNOSIS — I10 Essential (primary) hypertension: Secondary | ICD-10-CM | POA: Diagnosis not present

## 2018-11-08 DIAGNOSIS — H35033 Hypertensive retinopathy, bilateral: Secondary | ICD-10-CM | POA: Diagnosis not present

## 2018-12-01 ENCOUNTER — Ambulatory Visit (INDEPENDENT_AMBULATORY_CARE_PROVIDER_SITE_OTHER): Payer: Medicare Other

## 2018-12-01 VITALS — BP 116/70 | HR 66 | Temp 98.9°F | Ht 63.5 in | Wt 112.0 lb

## 2018-12-01 DIAGNOSIS — D518 Other vitamin B12 deficiency anemias: Secondary | ICD-10-CM | POA: Diagnosis not present

## 2018-12-01 MED ORDER — CYANOCOBALAMIN 1000 MCG/ML IJ SOLN: 1000.0000 ug | Freq: Once | INTRAMUSCULAR | Status: DC

## 2018-12-01 NOTE — Progress Notes (Signed)
Patient here for a NV to get her B12 injection 0.5 ml [place] inLEFT deltoid.Pt reports no concerns at this time.  Vital signs were entered into Epic at the time of visit. Pt was given check out slip after injection.

## 2018-12-29 ENCOUNTER — Other Ambulatory Visit: Payer: Self-pay | Admitting: Adult Health

## 2019-01-03 NOTE — Progress Notes (Signed)
Assessment and Plan:  Hypertension -Continue medication, monitor blood pressure at home. Continue DASH diet.  Reminder to go to the ER if any CP, SOB, nausea, dizziness, severe HA, changes vision/speech, left arm numbness and tingling and jaw pain.   Cholesterol -Continue diet and exercise. Check cholesterol.   Vitamin D Def - check level and continue medications.   Hypothyroidism -check TSH level, continue medications the same, reminded to take on an empty stomach 30-74mns before food.   Mild malnutrition.  Has had normal CXR, normal colonoscopy 2010, normal MGM 2004 but normal breast exam continue ensure/boost daily Weight is steady, will not get work up due to age, patient declines  COPD No triggers, well controlled symptoms, cont to monitor  Continue diet and meds as discussed. Further disposition pending results of labs. Over 30 minutes of exam, counseling, chart review, and critical decision making was performed  Future Appointments  Date Time Provider DLambert 03/21/2019 12:30 PM MHayden Pedro MD [facility] None  05/09/2019  2:30 PM MUnk Pinto MD [facility] None  09/13/2019 11:15 AM CVicie Mutters PA-C [facility] None     HPI 85y.o. female  presents for 3 month follow up on hypertension, cholesterol, prediabetes, and vitamin D deficiency.   She does not walk with a cane or assistant device, she will hold onto grocery carts and walls occ with walking, no falls recently. She is still driving, not at night. She does her own medications and cooking, lives alone and is very independent. She has no incontinence. She is on elavil 25 mg at night and she take xanax 0.243mAS needed when she is anxious, last refill was 04/29/2018.    Her blood pressure has been controlled at home, today their BP is BP: 130/68  She does workout. She denies chest pain, shortness of breath, dizziness.  She is not on cholesterol medication. Her cholesterol is at goal. The  cholesterol last visit was:   Lab Results  Component Value Date   CHOL 163 09/07/2018   HDL 56 09/07/2018   LDLCALC 85 09/07/2018   TRIG 127 09/07/2018   CHOLHDL 2.9 09/07/2018   She has been working on diet and exercise for prediabetes, and denies paresthesia of the feet, polydipsia, polyuria and visual disturbances. Last A1C in the office was:  Lab Results  Component Value Date   HGBA1C 5.5 05/04/2018   Patient is on Vitamin D supplement.   Lab Results  Component Value Date   VD25OH 68 05/04/2018   She follows with Dr. MaRodman Keyor mac degen in her right eye, states it has improved.  She is on thyroid medication, she is on 1 pill T, T, 1/2 every other day.  Her medication was changed last visit.   Lab Results  Component Value Date   TSH 1.35 09/07/2018  .  BMI is Body mass index is 19.77 kg/m.  Weight is holding steady, she is on boost.  She had normal colonoscopy in 2010, last MGM 2009, declines another, had normal CXR 07/2016. She denies headache, trouble swallowing, SOB, cough, wheezing, GERD, diarrhea/constipation, joint pain.  Wt Readings from Last 3 Encounters:  01/05/19 113 lb 6.4 oz (51.4 kg)  12/01/18 112 lb (50.8 kg)  11/01/18 111 lb 9.6 oz (50.6 kg)    Current Medications:  Current Outpatient Medications on File Prior to Visit  Medication Sig Dispense Refill  . ALPRAZolam (XANAX) 0.5 MG tablet Take 1/2 tablet at hour of sleep ONLY if needed 45 tablet 0  .  amitriptyline (ELAVIL) 50 MG tablet Take 1 tablet (50 mg total) by mouth at bedtime. 90 tablet 1  . amLODipine (NORVASC) 2.5 MG tablet TAKE 1 TABLET BY MOUTH EVERY DAY 90 tablet 2  . Cholecalciferol (VITAMIN D-3) 5000 UNITS TABS Take 5,000 Units by mouth daily.    . cyanocobalamin (,VITAMIN B-12,) 1000 MCG/ML injection INJECT 1 MILLILITER INTO THE SKIN EVERY 30 DAYS 3 mL 2  . levothyroxine (SYNTHROID, LEVOTHROID) 125 MCG tablet TAKE 1 TABLET BY MOUTH BEFORE BREAKFAST OR AS DIRECTED. 90 tablet 0  . omeprazole  (PRILOSEC) 40 MG capsule TAKE 1 CAPSULE BY MOUTH EVERY DAY 90 capsule 1   Current Facility-Administered Medications on File Prior to Visit  Medication Dose Route Frequency Provider Last Rate Last Dose  . cyanocobalamin ((VITAMIN B-12)) injection 1,000 mcg  1,000 mcg Intramuscular Once Vicie Mutters, PA-C      . cyanocobalamin ((VITAMIN B-12)) injection 1,000 mcg  1,000 mcg Intramuscular Once Unk Pinto, MD      . cyanocobalamin ((VITAMIN B-12)) injection 1,000 mcg  1,000 mcg Intramuscular Once Vicie Mutters, PA-C       Medical History:  Past Medical History:  Diagnosis Date  . Allergy   . Anemia   . Anxiety   . DDD (degenerative disc disease), lumbar   . Depression   . GERD (gastroesophageal reflux disease)   . Hyperlipidemia   . Hypertension   . OA (osteoarthritis) of knee    right knee  . Pre-diabetes   . Thyroid disease   . Vitamin D deficiency    Allergies:  Allergies  Allergen Reactions  . Amlodipine Swelling    Swelling/edema  . Norvasc [Amlodipine Besylate]   . Sulfa Antibiotics      Review of Systems:  Review of Systems  Constitutional: Negative for chills, diaphoresis, fever, malaise/fatigue and weight loss.  HENT: Negative.   Eyes: Negative.   Respiratory: Negative.  Negative for cough and shortness of breath.   Cardiovascular: Negative.  Negative for chest pain and palpitations.  Gastrointestinal: Negative.  Negative for abdominal pain and diarrhea.  Genitourinary: Negative.   Musculoskeletal: Negative.   Skin: Negative.   Neurological: Negative.  Negative for weakness.  Endo/Heme/Allergies: Negative.   Psychiatric/Behavioral: Negative.  Negative for depression.    Family history- Review and unchanged Social history- Review and unchanged Physical Exam: BP 130/68   Pulse 83   Temp 98.2 F (36.8 C)   Ht 5' 3.5" (1.613 m)   Wt 113 lb 6.4 oz (51.4 kg)   SpO2 95%   BMI 19.77 kg/m  Wt Readings from Last 3 Encounters:  01/05/19 113 lb 6.4  oz (51.4 kg)  12/01/18 112 lb (50.8 kg)  11/01/18 111 lb 9.6 oz (50.6 kg)   General appearance: alert, no distress, WD/WN,  female HEENT: Left eye ptosis, normocephalic, sclerae anicteric, TM pearly on left, right TM white/scarred, nares patent, no discharge or erythema, pharynx normal Oral cavity: MMM, no lesions Neck: supple, no lymphadenopathy, hard non mobile bone pass left anterior cervical neck, no pulse with it, patient states has been there forever, no thyromegaly, no masses Heart: RRR, normal S1, S2, no murmurs Lungs: CTA bilaterally, no wheezes, rhonchi, or rales Abdomen: +bs, soft, non tender, non distended, no masses, no hepatomegaly, no splenomegaly Musculoskeletal: nontender, no swelling, no obvious deformity, mild- moderate cervical kyphosis Extremities: no edema, no cyanosis, no clubbing Pulses: 2+ symmetric, upper and lower extremities, normal cap refill Neurological: alert, oriented x 3, CN2-12 intact, strength normal upper extremities and  lower extremities for age, sensation normal throughout, DTRs 2+ throughout, no cerebellar signs, gait normal Psychiatric: normal affect, behavior normal, pleasant    Vicie Mutters, PA-C 2:30 PM [facility]

## 2019-01-05 ENCOUNTER — Ambulatory Visit (INDEPENDENT_AMBULATORY_CARE_PROVIDER_SITE_OTHER): Payer: Medicare Other | Admitting: Physician Assistant

## 2019-01-05 ENCOUNTER — Encounter: Payer: Self-pay | Admitting: Physician Assistant

## 2019-01-05 VITALS — BP 130/68 | HR 83 | Temp 98.2°F | Ht 63.5 in | Wt 113.4 lb

## 2019-01-05 DIAGNOSIS — R7309 Other abnormal glucose: Secondary | ICD-10-CM

## 2019-01-05 DIAGNOSIS — E039 Hypothyroidism, unspecified: Secondary | ICD-10-CM

## 2019-01-05 DIAGNOSIS — D518 Other vitamin B12 deficiency anemias: Secondary | ICD-10-CM

## 2019-01-05 DIAGNOSIS — E782 Mixed hyperlipidemia: Secondary | ICD-10-CM | POA: Diagnosis not present

## 2019-01-05 DIAGNOSIS — J449 Chronic obstructive pulmonary disease, unspecified: Secondary | ICD-10-CM

## 2019-01-05 DIAGNOSIS — E441 Mild protein-calorie malnutrition: Secondary | ICD-10-CM | POA: Diagnosis not present

## 2019-01-05 DIAGNOSIS — D508 Other iron deficiency anemias: Secondary | ICD-10-CM | POA: Diagnosis not present

## 2019-01-05 DIAGNOSIS — I10 Essential (primary) hypertension: Secondary | ICD-10-CM

## 2019-01-05 MED ORDER — CYANOCOBALAMIN 1000 MCG/ML IJ SOLN
1000.0000 ug | Freq: Once | INTRAMUSCULAR | Status: AC
  Administered 2019-01-05: 1000 ug via INTRAMUSCULAR

## 2019-01-05 NOTE — Patient Instructions (Addendum)
WEIGHT GAIN Continue boost or enusre  Try to make sure you are eating plenty of high calorie dense foods like: Avocado Nuts Peanut butter Oatmeal Dates [place] yogurt Protein powder  GENERAL HEALTH GOALS  Know what a healthy weight is for you (roughly BMI <25) and aim to maintain this  Aim for 7+ servings of fruits and vegetables daily  70-80+ fluid ounces of water or unsweet tea for healthy kidneys  Limit to max 1 drink of alcohol per day; avoid smoking/tobacco  Limit animal fats in diet for cholesterol and heart health - choose grass fed whenever available  Avoid highly processed foods, and foods high in saturated/trans fats  Aim for low stress - take time to unwind and care for your mental health  Aim for 150 min of moderate intensity exercise weekly for heart health, and weights twice weekly for bone health  Aim for 7-9 hours of sleep daily  Producer, television/film/video Information Description of Services Cost  A Matter of Balance Class locations vary. Call [place] on Aging for more information.  http://dawson-may.com/ [phone] 8-Session program addressing the fear of falling and increasing activity levels of older adults Free to minimal cost  A.C.T. By [employer] [address], [place], [place] [address].  BetaBlues.dk [phone]  Personal training, gym, classes including Silver Sneakers* and ACTion for Aging Adults Fee-based  A.H.O.Y. (Add Health to [place]) Airs on Time [employer] 13, M-F at [place]: [employer],  [place] [facility] [place],  [place], [place] [facility], [address] [facility], [place], [place], [place] [address]  [place] Location: Sharrell Ku. [employer] [place]      [phone]  [phone]  [phone]  [phone]  [phone]  [phone]  [phone]  [phone]  [phone]  [phone]    [phone] A total-body conditioning class for adults 67 and older; designed to increase muscular strength, endurance, range of movement, flexibility, balance, agility and coordination Free  [facility] [place], [place] [address] [place]      [address]      [phone]      Pilate's class for individualsreturning to exercise after an injury, before or after surgery or for individuals with complex musculoskeletal issues; designed to improve strength, balance , flexibility      $15/class  [place] [address] [place], [place] [address] www.CreditChaos.dk [place] classes for beginners to advanced [place], [place] [address] Seniorcenter_0 -resources-[place].org www.senior-rescources-[place].org/sr.center.cfm Lyman Chair Exercises Free, ages 72 and older; Ages 64-59 fee based  Marvia Pickles, [facility] [address] [place], [place] [address] Seniorcenter_1 .Beverlee Nims [phone]  A.H.O.Y. Tai Chi Fee-based Donation based or free  [place] Class locations vary.  Call or email Angela Burke or view website for more information. Info_2 .com GainPain.com.cy.html [phone] Ongoing classes at local YMCAs and gyms Fee-based  Silver Sneakers A.C.T. By [place] [employer] Pure  Energy: [place] [facility] [place] [phone]  [phone] Classes designed for older adults who want to improve their strength, flexibility, balance and endurance.   Silver sneakers is covered by some insurance plans and includes a fitness center membership at participating locations. Find out more by calling [phone] or visiting www.silversneakers.com Covered by some insurance plans  [place] [facility] [place] [phone] A.H.O.Y., fitness room, personal training, fitness classes for injury prevention, strength, balance, flexibility, water fitness classes Ages 55+: $70 for 6 months; Ages 7-54: $34 for 6 months  Tai Chi for Everybody [facility] [address] [place], [place] [address] Taichiforeverybody_0 .Patsi Sears [phone] Tai Chi classes for beginners to advanced; geared for seniors Donation Based      [employer] (Helpling Others Participate in Exercise     Loyal Gambler. Rosana Hoes, PhD, [place] pgdavis_1 .edu [place]     [phone]     A comprehensive fitness program for adults.  The program paris senior-level undergraduates Kinesiology students with adults who desire to learn how to exercise safely.  Includes a structural exercise class focusing on functional fitnesss     $100/semester in fall and spring; $75 in summer (no trainers)    *Silver Sneakers is covered by some Personal assistant and includes a  Radio producer at participating locations.  Find out more by calling [phone] or visiting www.silversneakers.com  For additional health and human services resources for senior adults, please contact SeniorLine at [phone] in [place] and [place] at [phone] in all other areas.  Life Alert  Resources  Medical Alert Systems that can locate patients outside the home:  http://mobilealertsystems.com/ [phone] http://www.lifelinesys.com/content/lifeline-products/get-life-gosafe  626-093-1965 www.verizonwireless.com/sure/ 443-765-2573  Medial Alert systems that link to smart phones:  http://www.lifelinesys.com/content/lifeline-products/response-app https://www.stanton.info/ RecycleRoad.pl.aspx  Alzheimer's Association GPS Tracker:  VoiceZap.is [phone]

## 2019-01-06 LAB — CBC WITH DIFFERENTIAL/PLATELET
Absolute Monocytes: 442 cells/uL (ref 200–950)
BASOS ABS: 22 {cells}/uL (ref 0–200)
BASOS PCT: 0.4 %
EOS PCT: 0.9 %
Eosinophils Absolute: 50 cells/uL (ref 15–500)
HEMATOCRIT: 30.6 % — AB (ref 35.0–45.0)
HEMOGLOBIN: 10.1 g/dL — AB (ref 11.7–15.5)
Lymphs Abs: 1602 cells/uL (ref 850–3900)
MCH: 29.4 pg (ref 27.0–33.0)
MCHC: 33 g/dL (ref 32.0–36.0)
MCV: 89.2 fL (ref 80.0–100.0)
MONOS PCT: 7.9 %
MPV: 9.5 fL (ref 7.5–12.5)
NEUTROS ABS: 3483 {cells}/uL (ref 1500–7800)
Neutrophils Relative %: 62.2 %
Platelets: 233 10*3/uL (ref 140–400)
RBC: 3.43 10*6/uL — AB (ref 3.80–5.10)
RDW: 12.8 % (ref 11.0–15.0)
Total Lymphocyte: 28.6 %
WBC: 5.6 10*3/uL (ref 3.8–10.8)

## 2019-01-06 LAB — COMPLETE METABOLIC PANEL WITH GFR
AG Ratio: 1.7 (calc) (ref 1.0–2.5)
ALBUMIN MSPROF: 3.8 g/dL (ref 3.6–5.1)
ALT: 9 U/L (ref 6–29)
AST: 20 U/L (ref 10–35)
Alkaline phosphatase (APISO): 73 U/L (ref 37–153)
BUN/Creatinine Ratio: 22 (calc) (ref 6–22)
BUN: 20 mg/dL (ref 7–25)
CO2: 26 mmol/L (ref 20–32)
CREATININE: 0.92 mg/dL — AB (ref 0.60–0.88)
Calcium: 9.3 mg/dL (ref 8.6–10.4)
Chloride: 106 mmol/L (ref 98–110)
GFR, EST AFRICAN AMERICAN: 64 mL/min/{1.73_m2} (ref >=60)
GFR, EST NON AFRICAN AMERICAN: 56 mL/min/{1.73_m2} — AB (ref >=60)
GLOBULIN: 2.2 g/dL (ref 1.9–3.7)
GLUCOSE: 96 mg/dL (ref 65–99)
POTASSIUM: 4.7 mmol/L (ref 3.5–5.3)
Sodium: 139 mmol/L (ref 135–146)
TOTAL PROTEIN: 6 g/dL — AB (ref 6.1–8.1)
Total Bilirubin: 0.3 mg/dL (ref 0.2–1.2)

## 2019-01-06 LAB — LIPID PANEL
Cholesterol: 153 mg/dL (ref <200)
HDL: 55 mg/dL (ref >=50)
LDL Cholesterol (Calc): 80 mg/dL (calc)
Non-HDL Cholesterol (Calc): 98 mg/dL (calc) (ref <130)
Total CHOL/HDL Ratio: 2.8 (calc) (ref <5.0)
Triglycerides: 94 mg/dL (ref <150)

## 2019-01-06 LAB — TSH: TSH: 3.59 mIU/L (ref 0.40–4.50)

## 2019-01-11 ENCOUNTER — Ambulatory Visit: Payer: Self-pay | Admitting: Physician Assistant

## 2019-01-17 ENCOUNTER — Emergency Department (HOSPITAL_COMMUNITY): Payer: Medicare Other

## 2019-01-17 ENCOUNTER — Other Ambulatory Visit: Payer: Self-pay

## 2019-01-17 ENCOUNTER — Inpatient Hospital Stay (HOSPITAL_COMMUNITY)
Admission: EM | Admit: 2019-01-17 | Discharge: 2019-01-20 | DRG: 419 | Disposition: A | Discharge status: Home Health | Payer: Medicare Other | Attending: Internal Medicine | Admitting: Internal Medicine

## 2019-01-17 ENCOUNTER — Encounter (HOSPITAL_COMMUNITY): Payer: Self-pay | Admitting: Radiology

## 2019-01-17 DIAGNOSIS — Z825 Family history of asthma and other chronic lower respiratory diseases: Secondary | ICD-10-CM

## 2019-01-17 DIAGNOSIS — J439 Emphysema, unspecified: Secondary | ICD-10-CM | POA: Diagnosis not present

## 2019-01-17 DIAGNOSIS — E559 Vitamin D deficiency, unspecified: Secondary | ICD-10-CM | POA: Diagnosis present

## 2019-01-17 DIAGNOSIS — D513 Other dietary vitamin B12 deficiency anemia: Secondary | ICD-10-CM | POA: Diagnosis present

## 2019-01-17 DIAGNOSIS — Z79890 Hormone replacement therapy (postmenopausal): Secondary | ICD-10-CM

## 2019-01-17 DIAGNOSIS — E876 Hypokalemia: Secondary | ICD-10-CM | POA: Diagnosis not present

## 2019-01-17 DIAGNOSIS — Z9842 Cataract extraction status, left eye: Secondary | ICD-10-CM

## 2019-01-17 DIAGNOSIS — A419 Sepsis, unspecified organism: Secondary | ICD-10-CM

## 2019-01-17 DIAGNOSIS — Z882 Allergy status to sulfonamides status: Secondary | ICD-10-CM

## 2019-01-17 DIAGNOSIS — R740 Nonspecific elevation of levels of transaminase and lactic acid dehydrogenase [LDH]: Secondary | ICD-10-CM | POA: Diagnosis not present

## 2019-01-17 DIAGNOSIS — Z888 Allergy status to other drugs, medicaments and biological substances status: Secondary | ICD-10-CM

## 2019-01-17 DIAGNOSIS — M1711 Unilateral primary osteoarthritis, right knee: Secondary | ICD-10-CM | POA: Diagnosis present

## 2019-01-17 DIAGNOSIS — R000 Tachycardia, unspecified: Secondary | ICD-10-CM | POA: Diagnosis not present

## 2019-01-17 DIAGNOSIS — E782 Mixed hyperlipidemia: Secondary | ICD-10-CM | POA: Diagnosis present

## 2019-01-17 DIAGNOSIS — Z8249 Family history of ischemic heart disease and other diseases of the circulatory system: Secondary | ICD-10-CM

## 2019-01-17 DIAGNOSIS — F419 Anxiety disorder, unspecified: Secondary | ICD-10-CM | POA: Diagnosis present

## 2019-01-17 DIAGNOSIS — K219 Gastro-esophageal reflux disease without esophagitis: Secondary | ICD-10-CM | POA: Diagnosis present

## 2019-01-17 DIAGNOSIS — Z79899 Other long term (current) drug therapy: Secondary | ICD-10-CM

## 2019-01-17 DIAGNOSIS — E039 Hypothyroidism, unspecified: Secondary | ICD-10-CM | POA: Diagnosis not present

## 2019-01-17 DIAGNOSIS — K810 Acute cholecystitis: Secondary | ICD-10-CM | POA: Diagnosis not present

## 2019-01-17 DIAGNOSIS — R509 Fever, unspecified: Secondary | ICD-10-CM | POA: Diagnosis not present

## 2019-01-17 DIAGNOSIS — F319 Bipolar disorder, unspecified: Secondary | ICD-10-CM | POA: Diagnosis not present

## 2019-01-17 DIAGNOSIS — R197 Diarrhea, unspecified: Secondary | ICD-10-CM | POA: Diagnosis not present

## 2019-01-17 DIAGNOSIS — E119 Type 2 diabetes mellitus without complications: Secondary | ICD-10-CM | POA: Diagnosis present

## 2019-01-17 DIAGNOSIS — J449 Chronic obstructive pulmonary disease, unspecified: Secondary | ICD-10-CM | POA: Diagnosis present

## 2019-01-17 DIAGNOSIS — R0989 Other specified symptoms and signs involving the circulatory and respiratory systems: Secondary | ICD-10-CM | POA: Diagnosis present

## 2019-01-17 DIAGNOSIS — R52 Pain, unspecified: Secondary | ICD-10-CM | POA: Diagnosis not present

## 2019-01-17 DIAGNOSIS — R1084 Generalized abdominal pain: Secondary | ICD-10-CM | POA: Diagnosis not present

## 2019-01-17 DIAGNOSIS — H3530 Unspecified macular degeneration: Secondary | ICD-10-CM | POA: Diagnosis present

## 2019-01-17 DIAGNOSIS — Z9841 Cataract extraction status, right eye: Secondary | ICD-10-CM

## 2019-01-17 DIAGNOSIS — I10 Essential (primary) hypertension: Secondary | ICD-10-CM | POA: Diagnosis present

## 2019-01-17 DIAGNOSIS — Z419 Encounter for procedure for purposes other than remedying health state, unspecified: Secondary | ICD-10-CM

## 2019-01-17 DIAGNOSIS — R339 Retention of urine, unspecified: Secondary | ICD-10-CM | POA: Diagnosis not present

## 2019-01-17 DIAGNOSIS — M5136 Other intervertebral disc degeneration, lumbar region: Secondary | ICD-10-CM | POA: Diagnosis present

## 2019-01-17 LAB — INFLUENZA PANEL BY PCR (TYPE A & B)
Influenza A By PCR: NEGATIVE
Influenza B By PCR: NEGATIVE

## 2019-01-17 LAB — COMPREHENSIVE METABOLIC PANEL
ALBUMIN: 3.4 g/dL — AB (ref 3.5–5.0)
ALT: 149 U/L — ABNORMAL HIGH (ref 0–44)
ANION GAP: 10 (ref 5–15)
AST: 124 U/L — ABNORMAL HIGH (ref 15–41)
Alkaline Phosphatase: 160 U/L — ABNORMAL HIGH (ref 38–126)
BUN: 20 mg/dL (ref 8–23)
CO2: 19 mmol/L — ABNORMAL LOW (ref 22–32)
Calcium: 9 mg/dL (ref 8.9–10.3)
Chloride: 104 mmol/L (ref 98–111)
Creatinine, Ser: 1.07 mg/dL — ABNORMAL HIGH (ref 0.44–1.00)
GFR calc Af Amer: 54 mL/min — ABNORMAL LOW (ref >60)
GFR calc non Af Amer: 46 mL/min — ABNORMAL LOW (ref >60)
Glucose, Bld: 134 mg/dL — ABNORMAL HIGH (ref 70–99)
POTASSIUM: 3.5 mmol/L (ref 3.5–5.1)
Sodium: 133 mmol/L — ABNORMAL LOW (ref 135–145)
Total Bilirubin: 3.1 mg/dL — ABNORMAL HIGH (ref 0.3–1.2)
Total Protein: 6.4 g/dL — ABNORMAL LOW (ref 6.5–8.1)

## 2019-01-17 LAB — CBC WITH DIFFERENTIAL/PLATELET
Abs Immature Granulocytes: 0.03 10*3/uL (ref 0.00–0.07)
BASOS ABS: 0 10*3/uL (ref 0.0–0.1)
Basophils Relative: 0 %
EOS ABS: 0.1 10*3/uL (ref 0.0–0.5)
Eosinophils Relative: 2 %
HCT: 28.8 % — ABNORMAL LOW (ref 36.0–46.0)
Hemoglobin: 9.7 g/dL — ABNORMAL LOW (ref 12.0–15.0)
Immature Granulocytes: 0 %
Lymphocytes Relative: 2 %
Lymphs Abs: 0.1 10*3/uL — ABNORMAL LOW (ref 0.7–4.0)
MCH: 29.6 pg (ref 26.0–34.0)
MCHC: 33.7 g/dL (ref 30.0–36.0)
MCV: 87.8 fL (ref 80.0–100.0)
Monocytes Absolute: 0.2 10*3/uL (ref 0.1–1.0)
Monocytes Relative: 2 %
NRBC: 0 % (ref 0.0–0.2)
Neutro Abs: 6.8 10*3/uL (ref 1.7–7.7)
Neutrophils Relative %: 94 %
Platelets: 138 10*3/uL — ABNORMAL LOW (ref 150–400)
RBC: 3.28 MIL/uL — ABNORMAL LOW (ref 3.87–5.11)
RDW: 13.7 % (ref 11.5–15.5)
WBC: 7.3 10*3/uL (ref 4.0–10.5)

## 2019-01-17 LAB — URINALYSIS, ROUTINE W REFLEX MICROSCOPIC
Bilirubin Urine: NEGATIVE
Glucose, UA: NEGATIVE mg/dL
KETONES UR: 5 mg/dL — AB
Nitrite: NEGATIVE
Protein, ur: NEGATIVE mg/dL
Specific Gravity, Urine: 1.011 (ref 1.005–1.030)
pH: 5 (ref 5.0–8.0)

## 2019-01-17 LAB — LACTIC ACID, PLASMA: Lactic Acid, Venous: 1.2 mmol/L (ref 0.5–1.9)

## 2019-01-17 LAB — LIPASE, BLOOD: LIPASE: 59 U/L — AB (ref 11–51)

## 2019-01-17 LAB — PROTIME-INR
INR: 1.2 (ref 0.8–1.2)
Prothrombin Time: 15.1 seconds (ref 11.4–15.2)

## 2019-01-17 MED ORDER — SODIUM CHLORIDE 0.9 % IV SOLN
1.0000 g | Freq: Two times a day (BID) | INTRAVENOUS | Status: DC
  Administered 2019-01-18 – 2019-01-20 (×5): 1 g via INTRAVENOUS
  Filled 2019-01-17 (×7): qty 1

## 2019-01-17 MED ORDER — SODIUM CHLORIDE 0.9 % IV SOLN
INTRAVENOUS | Status: DC
  Administered 2019-01-17: via INTRAVENOUS

## 2019-01-17 MED ORDER — SODIUM CHLORIDE 0.9% FLUSH
3.0000 mL | Freq: Once | INTRAVENOUS | Status: AC
  Administered 2019-01-17: 3 mL via INTRAVENOUS

## 2019-01-17 MED ORDER — METRONIDAZOLE IN NACL 5-0.79 MG/ML-% IV SOLN
500.0000 mg | Freq: Three times a day (TID) | INTRAVENOUS | Status: DC
  Administered 2019-01-18 – 2019-01-20 (×7): 500 mg via INTRAVENOUS
  Filled 2019-01-17 (×7): qty 100

## 2019-01-17 MED ORDER — SODIUM CHLORIDE 0.9 % IV SOLN
2.0000 g | Freq: Once | INTRAVENOUS | Status: AC
  Administered 2019-01-17: 2 g via INTRAVENOUS
  Filled 2019-01-17: qty 2

## 2019-01-17 MED ORDER — ACETAMINOPHEN 500 MG PO TABS
1000.0000 mg | ORAL_TABLET | Freq: Once | ORAL | Status: AC
  Administered 2019-01-17: 1000 mg via ORAL
  Filled 2019-01-17: qty 2

## 2019-01-17 MED ORDER — METRONIDAZOLE IN NACL 5-0.79 MG/ML-% IV SOLN
500.0000 mg | Freq: Once | INTRAVENOUS | Status: AC
  Administered 2019-01-17: 500 mg via INTRAVENOUS
  Filled 2019-01-17: qty 100

## 2019-01-17 MED ORDER — IOHEXOL 300 MG/ML  SOLN
80.0000 mL | Freq: Once | INTRAMUSCULAR | Status: AC | PRN
  Administered 2019-01-17: 80 mL via INTRAVENOUS

## 2019-01-17 NOTE — ED Provider Notes (Signed)
[facility] EMERGENCY DEPARTMENT Provider Note   CSN: [id number] Arrival date & time: 01/17/19  2011    History   Chief Complaint Chief Complaint  Patient presents with   Abdominal Pain    HPI PANDORA BALCAZAR is a 85 y.o. female.     HPI  The patient is an 85 year old female, she presents to the hospital today with a complaint of having a fever, there has been some weakness, she was initially having some right upper quadrant pain though she states that that pain has now resolved and is only present intermittently.  She reports that she has never had any abdominal surgery, she has not been coughing nor has she had any urinary symptoms, rashes.  She does report that she had diarrhea yesterday and has had some today as well.  There is been no travel, no sick contacts, she had been taking Imodium which she felt was not working.  When her daughter went over to the house today she found her to be in this state with a temperature of 102 and brought her to the hospital.  The patient's family doctor had offered to see the patient in the morning however because of her fever she was brought to the hospital this evening.  Symptoms are persistent, nothing makes this better or worse, no associated travel or sick contacts.  Past Medical History:  Diagnosis Date   Allergy    Anemia    Anxiety    DDD (degenerative disc disease), lumbar    Depression    GERD (gastroesophageal reflux disease)    Hyperlipidemia    Hypertension    OA (osteoarthritis) of knee    right knee   Pre-diabetes    Thyroid disease    Vitamin D deficiency     Patient Active Problem List   Diagnosis Date Noted   Malnutrition of mild degree (HCC) 11/18/2016   COPD (chronic obstructive pulmonary disease) (HCC) 07/10/2016   Encounter for Medicare annual wellness exam 10/02/2015   Hypothyroidism 06/28/2015   Macular degeneration 06/28/2015   Lower back pain 06/28/2015    Medication management 12/05/2014   Hypertension    Hyperlipidemia, mixed    B12 deficiency anemia    Anemia    Gastroesophageal reflux disease    Vitamin D deficiency    Abnormal glucose     Past Surgical History:  Procedure Laterality Date   CATARACT EXTRACTION     both   KNEE ARTHROSCOPY Left      OB History   No obstetric history on file.      Home Medications    Prior to Admission medications   Medication Sig Start Date End Date Taking? Authorizing Provider  ALPRAZolam Prudy Feeler) 0.5 MG tablet Take 1/2 tablet at hour of sleep ONLY if needed 04/29/18   Lucky Cowboy, MD  amitriptyline (ELAVIL) 50 MG tablet Take 1 tablet (50 mg total) by mouth at bedtime. 02/03/18   Quentin Mulling, PA-C  amLODipine (NORVASC) 2.5 MG tablet TAKE 1 TABLET BY MOUTH EVERY DAY 12/29/18   Lucky Cowboy, MD  Cholecalciferol (VITAMIN D-3) 5000 UNITS TABS Take 5,000 Units by mouth daily.    [provider]  cyanocobalamin (,VITAMIN B-12,) 1000 MCG/ML injection INJECT 1 MILLILITER INTO THE SKIN EVERY 30 DAYS 11/01/18   Judd Gaudier, NP  levothyroxine (SYNTHROID, LEVOTHROID) 125 MCG tablet TAKE 1 TABLET BY MOUTH BEFORE BREAKFAST OR AS DIRECTED. 10/26/18   Judd Gaudier, NP  omeprazole (PRILOSEC) 40 MG capsule TAKE 1 CAPSULE  BY MOUTH EVERY DAY 02/03/18   Quentin Mulling, PA-C    Family History Family History  Problem Relation Age of Onset   Hypertension Mother    COPD Father    Heart attack Brother    Heart attack Brother     Social History Social History   Tobacco Use   Smoking status: Never Smoker   Smokeless tobacco: Never Used  Substance Use Topics   Alcohol use: No   Drug use: No     Allergies   Amlodipine; Norvasc [amlodipine besylate]; and Sulfa antibiotics   Review of Systems Review of Systems  All other systems reviewed and are negative.    Physical Exam Updated Vital Signs BP 126/76    Pulse 93    Temp (!) 102.9 F (39.4 C) (Oral)     Resp 16    Ht 1.626 m ( )    Wt 51 kg    SpO2 99%    BMI 19.30 kg/m   Physical Exam Vitals signs and nursing note reviewed.  Constitutional:      General: She is not in acute distress.    Appearance: She is well-developed.  HENT:     Head: Normocephalic and atraumatic.     Mouth/Throat:     Pharynx: No oropharyngeal exudate.     Comments: Dry MM Eyes:     General: No scleral icterus.       Right eye: No discharge.        Left eye: No discharge.     Conjunctiva/sclera: Conjunctivae normal.     Pupils: Pupils are equal, round, and reactive to light.  Neck:     Musculoskeletal: Normal range of motion and neck supple.     Thyroid: No thyromegaly.     Vascular: No JVD.  Cardiovascular:     Rate and Rhythm: Regular rhythm. Tachycardia present.     Heart sounds: Normal heart sounds. No murmur. No friction rub. No gallop.   Pulmonary:     Effort: Pulmonary effort is normal. No respiratory distress.     Breath sounds: Normal breath sounds. No wheezing or rales.  Abdominal:     General: Bowel sounds are normal. There is no distension.     Palpations: Abdomen is soft. There is no mass.     Tenderness: There is abdominal tenderness ( ttp in the RUQ, mild).  Musculoskeletal: Normal range of motion.        General: No tenderness.  Lymphadenopathy:     Cervical: No cervical adenopathy.  Skin:    General: Skin is warm and dry.     Findings: No erythema or rash.  Neurological:     Mental Status: She is alert.     Coordination: Coordination normal.  Psychiatric:        Behavior: Behavior normal.      ED Treatments / Results  Labs (all labs ordered are listed, but only abnormal results are displayed) Labs Reviewed  COMPREHENSIVE METABOLIC PANEL - Abnormal; Notable for the following components:      Result Value   Sodium 133 (*)    CO2 19 (*)    Glucose, Bld 134 (*)    Creatinine, Ser 1.07 (*)    Total Protein 6.4 (*)    Albumin 3.4 (*)    AST 124 (*)    ALT 149 (*)     Alkaline Phosphatase 160 (*)    Total Bilirubin 3.1 (*)    GFR calc non Af Amer 46 (*)  GFR calc Af Amer 54 (*)    All other components within normal limits  CBC WITH DIFFERENTIAL/PLATELET - Abnormal; Notable for the following components:   RBC 3.28 (*)    Hemoglobin 9.7 (*)    HCT 28.8 (*)    Platelets 138 (*)    Lymphs Abs 0.1 (*)    All other components within normal limits  URINALYSIS, ROUTINE W REFLEX MICROSCOPIC - Abnormal; Notable for the following components:   Hgb urine dipstick LARGE (*)    Ketones, ur 5 (*)    Leukocytes,Ua SMALL (*)    Bacteria, UA RARE (*)    All other components within normal limits  LIPASE, BLOOD - Abnormal; Notable for the following components:   Lipase 59 (*)    All other components within normal limits  CULTURE, BLOOD (ROUTINE X 2)  CULTURE, BLOOD (ROUTINE X 2)  CULTURE, BLOOD (ROUTINE X 2)  CULTURE, BLOOD (ROUTINE X 2)  URINE CULTURE  LACTIC ACID, PLASMA  PROTIME-INR  INFLUENZA PANEL BY PCR (TYPE A & B)  LACTIC ACID, PLASMA    EKG EKG Interpretation  Date/Time:  Monday January 17 2019 20:56:52 EDT Ventricular Rate:  108 PR Interval:    QRS Duration: 91 QT Interval:  320 QTC Calculation: 429 R Axis:   55 Text Interpretation:  Sinus tachycardia Anteroseptal infarct, age indeterminate Since last tracing rate faster Confirmed by Eber Hong ([id number]) on 01/17/2019 9:12:45 PM   Radiology Dg Chest 2 View  Result Date: 01/17/2019 CLINICAL DATA:  Sepsis, fever EXAM: CHEST - 2 VIEW COMPARISON:  07/31/2016 FINDINGS: There is hyperinflation of the lungs compatible with COPD. Biapical scarring. Heart is normal size. No confluent opacities or effusions. Mild peribronchial thickening. IMPRESSION: Emphysema. Mild chronic bronchitic changes. Biapical scarring. No active disease. Electronically Signed   By: Charlett Nose M.D.   On: 01/17/2019 21:29   Ct Abdomen Pelvis W Contrast  Result Date: 01/17/2019 CLINICAL DATA:  Right upper quadrant  abdominal pain with nausea and diarrhea for 3 days. EXAM: CT ABDOMEN AND PELVIS WITH CONTRAST TECHNIQUE: Multidetector CT imaging of the abdomen and pelvis was performed using the standard protocol following bolus administration of intravenous contrast. CONTRAST:  68mL OMNIPAQUE IOHEXOL 300 MG/ML  SOLN COMPARISON:  None. FINDINGS: Lower chest: No acute findings. Several lung nodules, largest left upper lobe lingula abutting the left heart border measuring 6 mm. There are additional linear and reticular opacities that are likely due to scarring and/or subsegmental atelectasis. Hepatobiliary: Gallbladder is distended. Wall thickening measures up to 8 mm. 1 cm dependent stone or possible polyp lies in the upper gallbladder segment. There is inflammation along the gastrohepatic ligament. No common bile duct dilation. Liver shows mild intrahepatic bile duct dilation. No liver masses or focal lesions. Pancreas: Unremarkable. No pancreatic ductal dilatation or surrounding inflammatory changes. Spleen: Normal in size without focal abnormality. Adrenals/Urinary Tract: No adrenal masses. Mild bilateral renal cortical thinning. Left upper pole cyst, 1.1 cm. No other renal masses. No hydronephrosis. Ureters are normal in course and in caliber. Bladder is unremarkable. Stomach/Bowel: Small hiatal hernia. Stomach otherwise unremarkable. Second portion of the duodenum is compressed by the distended gallbladder. Small bowel and colon are normal in caliber. No wall thickening or inflammation. There are scattered left colon diverticula. No diverticulitis. No evidence of appendicitis. Vascular/Lymphatic: Aortic atherosclerosis. No enlarged abdominal or pelvic lymph nodes. Reproductive: Uterus and bilateral adnexa are unremarkable. Other: No abdominal wall hernia.  No ascites. Musculoskeletal: Mild to moderate chronic compression fracture of L2  no acute fracture. No osteoblastic or osteolytic lesions. IMPRESSION: 1. Acute  cholecystitis. 2. No other acute abnormality within the abdomen or pelvis. 3. Small lung base nodules. No follow-up needed if patient is low-risk (and has no known or suspected primary neoplasm). Non-contrast chest CT can be considered in 12 months if patient is high-risk. This recommendation follows the consensus statement: Guidelines for Management of Incidental Pulmonary Nodules Detected on CT Images: From the Fleischner Society 2017; Radiology 2017; 284:228-243. Electronically Signed   By: Amie Portland M.D.   On: 01/17/2019 23:03    Procedures .Critical Care Performed by: Eber Hong, MD Authorized by: Eber Hong, MD   Critical care provider statement:    Critical care time (minutes):  35   Critical care time was exclusive of:  Separately billable procedures and treating other patients and teaching time   Critical care was necessary to treat or prevent imminent or life-threatening deterioration of the following conditions:  Sepsis   Critical care was time spent personally by me on the following activities:  Blood draw for specimens, development of treatment plan with patient or surrogate, discussions with consultants, evaluation of patient's response to treatment, examination of patient, obtaining history from patient or surrogate, ordering and performing treatments and interventions, ordering and review of laboratory studies, ordering and review of radiographic studies, pulse oximetry, re-evaluation of patient's condition and review of old charts   (including critical care time)  Medications Ordered in ED Medications  0.9 %  sodium chloride infusion ( Intravenous New Bag/Given 01/17/19 2348)  ceFEPIme (MAXIPIME) 1 g in sodium chloride 0.9 % 100 mL IVPB (has no administration in time range)  metroNIDAZOLE (FLAGYL) IVPB 500 mg (has no administration in time range)  sodium chloride flush (NS) 0.9 % injection 3 mL (3 mLs Intravenous Given 01/17/19 2127)  ceFEPIme (MAXIPIME) 2 g in sodium  chloride 0.9 % 100 mL IVPB (0 g Intravenous Stopped 01/17/19 2341)  metroNIDAZOLE (FLAGYL) IVPB 500 mg (0 mg Intravenous Stopped 01/17/19 2342)  acetaminophen (TYLENOL) tablet 1,000 mg (1,000 mg Oral Given 01/17/19 2127)  iohexol (OMNIPAQUE) 300 MG/ML solution 80 mL (80 mLs Intravenous Contrast Given 01/17/19 2232)     Initial Impression / Assessment and Plan / ED Course  I have reviewed the triage vital signs and the nursing notes.  Pertinent labs & imaging results that were available during my care of the patient were reviewed by me and considered in my medical decision making (see chart for details).        The patient presents with right upper quadrant pain fever and tachycardia.  She meets sirs criteria and I suspect that she has an intra-abdominal infection.  Given that she is elderly, has some muscle wasting and appears ill she will likely need to be admitted to the hospital.  We will check for influenza as well as pneumonia urinary tract infection or possibly right upper quadrant pathology including cholangitis.  Code sepsis activated.  Antibiotics ordered.  The patient is not hypotensive at this time.  Last blood pressure was 164/69.  Lactate pending  I have discussed the case with general surgery Dr. Janee Morn who has been kind enough to see the patient in consultation for surgical evaluation.  Will discuss with hospitalist  Discussed with Dr. Julian Reil who will admit for ongoing evaluation and treatment.  The patient meets criteria for sepsis given her systemic inflammatory response syndrome with tachycardia, fever and a source of infection with her right upper quadrant biliary tree.  She has  been given antibiotics to cover for this, she is not in shock  Final Clinical Impressions(s) / ED Diagnoses   Final diagnoses:  Acute cholecystitis  Sepsis, due to unspecified organism, unspecified whether acute organ dysfunction present [facility])      Eber Hong, MD 01/18/19 0002

## 2019-01-17 NOTE — ED Triage Notes (Signed)
Pt reports RUQ abd pain, nausea, diarrhea since Saturday. Poor historian. Febrile in triage.

## 2019-01-17 NOTE — Progress Notes (Signed)
Pharmacy Antibiotic Note  Shelby Bradley is a 85 y.o. female admitted on 01/17/2019 with sepsis.  Pharmacy has been consulted for Cefepime dosing.  Plan: Cefepime 2 gm IV x 1, then 1 gm IV q12hr Monitor renal function and C&S  Height: 5\' 4"  (162.6 cm) Weight: 112 lb 7 oz (51 kg) IBW/kg (Calculated) : 54.7  Temp (24hrs), Avg:102.9 F (39.4 C), Min:102.9 F (39.4 C), Max:102.9 F (39.4 C)  Recent Labs  Lab 01/17/19 2047  WBC 7.3  CREATININE 1.07*  LATICACIDVEN 1.2    Estimated Creatinine Clearance: 29.3 mL/min (A) (by C-G formula based on SCr of 1.07 mg/dL (H)).    Allergies  Allergen Reactions  . Amlodipine Swelling    Swelling/edema  . Norvasc [Amlodipine Besylate]   . Sulfa Antibiotics     Antimicrobials this admission: Cefepime 3/16 >>   Thank you for allowing pharmacy to be a part of this patient's care.  Jeanella Cara, PharmD, [facility] Clinical Pharmacist Please see AMION for all Pharmacists' Contact Phone Numbers 01/17/2019, 9:53 PM

## 2019-01-18 ENCOUNTER — Encounter (HOSPITAL_COMMUNITY)
Admission: EM | Disposition: A | Discharge status: Home Health | Payer: Self-pay | Source: Home / Self Care | Attending: Internal Medicine

## 2019-01-18 ENCOUNTER — Inpatient Hospital Stay (HOSPITAL_COMMUNITY): Payer: Medicare Other

## 2019-01-18 ENCOUNTER — Inpatient Hospital Stay (HOSPITAL_COMMUNITY): Payer: Medicare Other | Admitting: Registered Nurse

## 2019-01-18 ENCOUNTER — Other Ambulatory Visit: Payer: Self-pay

## 2019-01-18 ENCOUNTER — Encounter (HOSPITAL_COMMUNITY): Payer: Self-pay | Admitting: Neurology

## 2019-01-18 ENCOUNTER — Ambulatory Visit: Payer: Medicare Other | Admitting: Adult Health Nurse Practitioner

## 2019-01-18 DIAGNOSIS — R339 Retention of urine, unspecified: Secondary | ICD-10-CM | POA: Diagnosis not present

## 2019-01-18 DIAGNOSIS — Z79890 Hormone replacement therapy (postmenopausal): Secondary | ICD-10-CM | POA: Diagnosis not present

## 2019-01-18 DIAGNOSIS — Z882 Allergy status to sulfonamides status: Secondary | ICD-10-CM | POA: Diagnosis not present

## 2019-01-18 DIAGNOSIS — R945 Abnormal results of liver function studies: Secondary | ICD-10-CM | POA: Diagnosis not present

## 2019-01-18 DIAGNOSIS — J449 Chronic obstructive pulmonary disease, unspecified: Secondary | ICD-10-CM

## 2019-01-18 DIAGNOSIS — R1011 Right upper quadrant pain: Secondary | ICD-10-CM | POA: Diagnosis not present

## 2019-01-18 DIAGNOSIS — J439 Emphysema, unspecified: Secondary | ICD-10-CM | POA: Diagnosis present

## 2019-01-18 DIAGNOSIS — E119 Type 2 diabetes mellitus without complications: Secondary | ICD-10-CM | POA: Diagnosis present

## 2019-01-18 DIAGNOSIS — M5136 Other intervertebral disc degeneration, lumbar region: Secondary | ICD-10-CM | POA: Diagnosis present

## 2019-01-18 DIAGNOSIS — E559 Vitamin D deficiency, unspecified: Secondary | ICD-10-CM | POA: Diagnosis present

## 2019-01-18 DIAGNOSIS — Z79899 Other long term (current) drug therapy: Secondary | ICD-10-CM | POA: Diagnosis not present

## 2019-01-18 DIAGNOSIS — K219 Gastro-esophageal reflux disease without esophagitis: Secondary | ICD-10-CM | POA: Diagnosis present

## 2019-01-18 DIAGNOSIS — K8010 Calculus of gallbladder with chronic cholecystitis without obstruction: Secondary | ICD-10-CM | POA: Diagnosis not present

## 2019-01-18 DIAGNOSIS — Z825 Family history of asthma and other chronic lower respiratory diseases: Secondary | ICD-10-CM | POA: Diagnosis not present

## 2019-01-18 DIAGNOSIS — F319 Bipolar disorder, unspecified: Secondary | ICD-10-CM | POA: Diagnosis present

## 2019-01-18 DIAGNOSIS — E039 Hypothyroidism, unspecified: Secondary | ICD-10-CM | POA: Diagnosis present

## 2019-01-18 DIAGNOSIS — K810 Acute cholecystitis: Principal | ICD-10-CM

## 2019-01-18 DIAGNOSIS — D513 Other dietary vitamin B12 deficiency anemia: Secondary | ICD-10-CM | POA: Diagnosis present

## 2019-01-18 DIAGNOSIS — H3530 Unspecified macular degeneration: Secondary | ICD-10-CM | POA: Diagnosis present

## 2019-01-18 DIAGNOSIS — Z9841 Cataract extraction status, right eye: Secondary | ICD-10-CM | POA: Diagnosis not present

## 2019-01-18 DIAGNOSIS — E876 Hypokalemia: Secondary | ICD-10-CM | POA: Diagnosis present

## 2019-01-18 DIAGNOSIS — I10 Essential (primary) hypertension: Secondary | ICD-10-CM

## 2019-01-18 DIAGNOSIS — Z888 Allergy status to other drugs, medicaments and biological substances status: Secondary | ICD-10-CM | POA: Diagnosis not present

## 2019-01-18 DIAGNOSIS — M1711 Unilateral primary osteoarthritis, right knee: Secondary | ICD-10-CM | POA: Diagnosis present

## 2019-01-18 DIAGNOSIS — R740 Nonspecific elevation of levels of transaminase and lactic acid dehydrogenase [LDH]: Secondary | ICD-10-CM | POA: Diagnosis present

## 2019-01-18 DIAGNOSIS — Z9842 Cataract extraction status, left eye: Secondary | ICD-10-CM | POA: Diagnosis not present

## 2019-01-18 DIAGNOSIS — Z8249 Family history of ischemic heart disease and other diseases of the circulatory system: Secondary | ICD-10-CM | POA: Diagnosis not present

## 2019-01-18 DIAGNOSIS — K9181 Other intraoperative complications of digestive system: Secondary | ICD-10-CM | POA: Diagnosis not present

## 2019-01-18 DIAGNOSIS — F419 Anxiety disorder, unspecified: Secondary | ICD-10-CM | POA: Diagnosis present

## 2019-01-18 HISTORY — PX: CHOLECYSTECTOMY: SHX55

## 2019-01-18 HISTORY — DX: Acute cholecystitis: K81.0

## 2019-01-18 LAB — COMPREHENSIVE METABOLIC PANEL
ALT: 113 U/L — ABNORMAL HIGH (ref 0–44)
AST: 89 U/L — ABNORMAL HIGH (ref 15–41)
Albumin: 2.7 g/dL — ABNORMAL LOW (ref 3.5–5.0)
Alkaline Phosphatase: 143 U/L — ABNORMAL HIGH (ref 38–126)
Anion gap: 9 (ref 5–15)
BILIRUBIN TOTAL: 3.1 mg/dL — AB (ref 0.3–1.2)
BUN: 17 mg/dL (ref 8–23)
CO2: 19 mmol/L — ABNORMAL LOW (ref 22–32)
CREATININE: 1.02 mg/dL — AB (ref 0.44–1.00)
Calcium: 8.1 mg/dL — ABNORMAL LOW (ref 8.9–10.3)
Chloride: 103 mmol/L (ref 98–111)
GFR calc Af Amer: 57 mL/min — ABNORMAL LOW (ref >60)
GFR calc non Af Amer: 49 mL/min — ABNORMAL LOW (ref >60)
Glucose, Bld: 127 mg/dL — ABNORMAL HIGH (ref 70–99)
Potassium: 3.1 mmol/L — ABNORMAL LOW (ref 3.5–5.1)
Sodium: 131 mmol/L — ABNORMAL LOW (ref 135–145)
Total Protein: 5.1 g/dL — ABNORMAL LOW (ref 6.5–8.1)

## 2019-01-18 LAB — URINE CULTURE

## 2019-01-18 LAB — LACTIC ACID, PLASMA: Lactic Acid, Venous: 0.9 mmol/L (ref 0.5–1.9)

## 2019-01-18 LAB — MRSA PCR SCREENING: MRSA by PCR: NEGATIVE

## 2019-01-18 SURGERY — LAPAROSCOPIC CHOLECYSTECTOMY WITH INTRAOPERATIVE CHOLANGIOGRAM
Anesthesia: General

## 2019-01-18 MED ORDER — FENTANYL CITRATE (PF) 100 MCG/2ML IJ SOLN
INTRAMUSCULAR | Status: DC | PRN
  Administered 2019-01-18 (×2): 50 ug via INTRAVENOUS
  Administered 2019-01-18: 100 ug via INTRAVENOUS
  Administered 2019-01-18: 50 ug via INTRAVENOUS

## 2019-01-18 MED ORDER — OXYCODONE HCL 5 MG/5ML PO SOLN: 5.0000 mg | Freq: Once | ORAL | Status: DC | PRN

## 2019-01-18 MED ORDER — PANTOPRAZOLE SODIUM 40 MG PO TBEC
80.0000 mg | DELAYED_RELEASE_TABLET | Freq: Every day | ORAL | Status: DC
  Administered 2019-01-18 – 2019-01-20 (×3): 80 mg via ORAL
  Filled 2019-01-18 (×3): qty 2

## 2019-01-18 MED ORDER — SUCCINYLCHOLINE CHLORIDE 200 MG/10ML IV SOSY
PREFILLED_SYRINGE | INTRAVENOUS | Status: DC | PRN
  Administered 2019-01-18: 60 mg via INTRAVENOUS

## 2019-01-18 MED ORDER — PROPOFOL 10 MG/ML IV BOLUS
INTRAVENOUS | Status: AC
  Filled 2019-01-18: qty 20

## 2019-01-18 MED ORDER — ACETAMINOPHEN 325 MG PO TABS
650.0000 mg | ORAL_TABLET | Freq: Four times a day (QID) | ORAL | Status: DC | PRN
  Administered 2019-01-18 – 2019-01-20 (×3): 650 mg via ORAL
  Filled 2019-01-18 (×3): qty 2

## 2019-01-18 MED ORDER — LACTATED RINGERS IV SOLN
INTRAVENOUS | Status: DC
  Administered 2019-01-18: 09:00:00 via INTRAVENOUS

## 2019-01-18 MED ORDER — AMLODIPINE BESYLATE 2.5 MG PO TABS
2.5000 mg | ORAL_TABLET | Freq: Every day | ORAL | Status: DC
  Administered 2019-01-18: 2.5 mg via ORAL
  Filled 2019-01-18 (×3): qty 1

## 2019-01-18 MED ORDER — WITCH HAZEL-GLYCERIN EX PADS
MEDICATED_PAD | CUTANEOUS | Status: DC | PRN
  Filled 2019-01-18: qty 100

## 2019-01-18 MED ORDER — FENTANYL CITRATE (PF) 100 MCG/2ML IJ SOLN
25.0000 ug | INTRAMUSCULAR | Status: DC | PRN
  Administered 2019-01-18: 50 ug via INTRAVENOUS

## 2019-01-18 MED ORDER — OXYCODONE HCL 5 MG PO TABS: 5.0000 mg | ORAL_TABLET | Freq: Once | ORAL | Status: DC | PRN

## 2019-01-18 MED ORDER — BUPIVACAINE-EPINEPHRINE 0.25% -1:200000 IJ SOLN
INTRAMUSCULAR | Status: DC | PRN
  Administered 2019-01-18: 8 mL
  Administered 2019-01-18: 30 mL

## 2019-01-18 MED ORDER — 0.9 % SODIUM CHLORIDE (POUR BTL) OPTIME
TOPICAL | Status: DC | PRN
  Administered 2019-01-18: 1000 mL

## 2019-01-18 MED ORDER — GLYCOPYRROLATE PF 0.2 MG/ML IJ SOSY
PREFILLED_SYRINGE | INTRAMUSCULAR | Status: DC | PRN
  Administered 2019-01-18: .2 mg via INTRAVENOUS

## 2019-01-18 MED ORDER — FENTANYL CITRATE (PF) 250 MCG/5ML IJ SOLN
INTRAMUSCULAR | Status: AC
  Filled 2019-01-18: qty 5

## 2019-01-18 MED ORDER — SODIUM CHLORIDE 0.9 % IV SOLN
INTRAVENOUS | Status: DC | PRN
  Administered 2019-01-18: 25 ug/min via INTRAVENOUS

## 2019-01-18 MED ORDER — ACETAMINOPHEN 160 MG/5ML PO SOLN: 1000.0000 mg | Freq: Once | ORAL | Status: DC | PRN

## 2019-01-18 MED ORDER — ALPRAZOLAM 0.25 MG PO TABS
0.2500 mg | ORAL_TABLET | Freq: Every evening | ORAL | Status: DC | PRN
  Administered 2019-01-19 (×2): 0.25 mg via ORAL
  Filled 2019-01-18 (×2): qty 1

## 2019-01-18 MED ORDER — ACETAMINOPHEN 10 MG/ML IV SOLN: 1000.0000 mg | Freq: Once | INTRAVENOUS | Status: DC | PRN

## 2019-01-18 MED ORDER — DEXAMETHASONE SODIUM PHOSPHATE 10 MG/ML IJ SOLN
INTRAMUSCULAR | Status: DC | PRN
  Administered 2019-01-18: 5 mg via INTRAVENOUS

## 2019-01-18 MED ORDER — LIDOCAINE 2% (20 MG/ML) 5 ML SYRINGE
INTRAMUSCULAR | Status: DC | PRN
  Administered 2019-01-18: 60 mg via INTRAVENOUS

## 2019-01-18 MED ORDER — LEVOTHYROXINE SODIUM 25 MCG PO TABS
125.0000 ug | ORAL_TABLET | Freq: Every day | ORAL | Status: DC
  Administered 2019-01-18 – 2019-01-20 (×3): 125 ug via ORAL
  Filled 2019-01-18 (×3): qty 1

## 2019-01-18 MED ORDER — DEXAMETHASONE SODIUM PHOSPHATE 10 MG/ML IJ SOLN
INTRAMUSCULAR | Status: AC
  Filled 2019-01-18: qty 1

## 2019-01-18 MED ORDER — PROPOFOL 10 MG/ML IV BOLUS
INTRAVENOUS | Status: DC | PRN
  Administered 2019-01-18: 70 mg via INTRAVENOUS

## 2019-01-18 MED ORDER — GLYCOPYRROLATE PF 0.2 MG/ML IJ SOSY
PREFILLED_SYRINGE | INTRAMUSCULAR | Status: AC
  Filled 2019-01-18: qty 1

## 2019-01-18 MED ORDER — FENTANYL CITRATE (PF) 100 MCG/2ML IJ SOLN
INTRAMUSCULAR | Status: AC
  Filled 2019-01-18: qty 2

## 2019-01-18 MED ORDER — ONDANSETRON HCL 4 MG/2ML IJ SOLN
INTRAMUSCULAR | Status: AC
  Filled 2019-01-18: qty 2

## 2019-01-18 MED ORDER — BUPIVACAINE-EPINEPHRINE (PF) 0.25% -1:200000 IJ SOLN
INTRAMUSCULAR | Status: AC
  Filled 2019-01-18: qty 30

## 2019-01-18 MED ORDER — ONDANSETRON HCL 4 MG PO TABS: 4.0000 mg | ORAL_TABLET | Freq: Four times a day (QID) | ORAL | Status: DC | PRN

## 2019-01-18 MED ORDER — LIDOCAINE 2% (20 MG/ML) 5 ML SYRINGE
INTRAMUSCULAR | Status: AC
  Filled 2019-01-18: qty 5

## 2019-01-18 MED ORDER — ACETAMINOPHEN 500 MG PO TABS: 1000.0000 mg | ORAL_TABLET | Freq: Once | ORAL | Status: DC | PRN

## 2019-01-18 MED ORDER — SODIUM CHLORIDE 0.9 % IV SOLN
INTRAVENOUS | Status: DC | PRN
  Administered 2019-01-18: 50 mL

## 2019-01-18 MED ORDER — SUCCINYLCHOLINE CHLORIDE 200 MG/10ML IV SOSY
PREFILLED_SYRINGE | INTRAVENOUS | Status: AC
  Filled 2019-01-18: qty 10

## 2019-01-18 MED ORDER — AMITRIPTYLINE HCL 25 MG PO TABS
25.0000 mg | ORAL_TABLET | Freq: Every day | ORAL | Status: DC
  Administered 2019-01-18 – 2019-01-19 (×3): 25 mg via ORAL
  Filled 2019-01-18 (×3): qty 1

## 2019-01-18 MED ORDER — ACETAMINOPHEN 650 MG RE SUPP: 650.0000 mg | Freq: Four times a day (QID) | RECTAL | Status: DC | PRN

## 2019-01-18 MED ORDER — ONDANSETRON HCL 4 MG/2ML IJ SOLN: 4.0000 mg | Freq: Four times a day (QID) | INTRAMUSCULAR | Status: DC | PRN

## 2019-01-18 MED ORDER — ONDANSETRON HCL 4 MG/2ML IJ SOLN
INTRAMUSCULAR | Status: DC | PRN
  Administered 2019-01-18: 4 mg via INTRAVENOUS

## 2019-01-18 SURGICAL SUPPLY — 45 items
ADH SKN CLS APL DERMABOND .7 (GAUZE/BANDAGES/DRESSINGS) ×1
ADH SKN CLS LQ APL DERMABOND (GAUZE/BANDAGES/DRESSINGS) ×1
APPLIER CLIP 5 13 M/L LIGAMAX5 (MISCELLANEOUS) ×3
APR CLP MED LRG 5 ANG JAW (MISCELLANEOUS) ×1
BAG SPEC RTRVL LRG 6X4 10 (ENDOMECHANICALS) ×1
CANISTER SUCT 3000ML PPV (MISCELLANEOUS) ×3 IMPLANT
CHLORAPREP W/TINT 26ML (MISCELLANEOUS) ×3 IMPLANT
CLIP APPLIE 5 13 M/L LIGAMAX5 (MISCELLANEOUS) ×1 IMPLANT
CONT SPEC 4OZ CLIKSEAL STRL BL (MISCELLANEOUS) ×3 IMPLANT
COVER MAYO STAND STRL (DRAPES) ×2 IMPLANT
COVER SURGICAL LIGHT HANDLE (MISCELLANEOUS) ×3 IMPLANT
COVER WAND RF STERILE (DRAPES) ×3 IMPLANT
DERMABOND ADHESIVE PROPEN (GAUZE/BANDAGES/DRESSINGS) ×2
DERMABOND ADVANCED (GAUZE/BANDAGES/DRESSINGS) ×2
DERMABOND ADVANCED .7 DNX12 (GAUZE/BANDAGES/DRESSINGS) ×1 IMPLANT
DERMABOND ADVANCED .7 DNX6 (GAUZE/BANDAGES/DRESSINGS) IMPLANT
DRAPE C-ARM 42X72 X-RAY (DRAPES) ×2 IMPLANT
ELECT REM PT RETURN 9FT ADLT (ELECTROSURGICAL) ×3
ELECTRODE REM PT RTRN 9FT ADLT (ELECTROSURGICAL) ×1 IMPLANT
GLOVE BIO SURGEON STRL SZ 6 (GLOVE) ×3 IMPLANT
GLOVE ECLIPSE 7.5 STRL STRAW (GLOVE) ×2 IMPLANT
GLOVE INDICATOR 6.5 STRL GRN (GLOVE) ×3 IMPLANT
GLOVE INDICATOR 7.5 STRL GRN (GLOVE) ×4 IMPLANT
GOWN STRL REUS W/ TWL LRG LVL3 (GOWN DISPOSABLE) ×3 IMPLANT
GOWN STRL REUS W/TWL LRG LVL3 (GOWN DISPOSABLE) ×9
GRASPER SUT TROCAR 14GX15 (MISCELLANEOUS) ×3 IMPLANT
KIT BASIN OR (CUSTOM PROCEDURE TRAY) ×3 IMPLANT
KIT TURNOVER KIT B (KITS) ×3 IMPLANT
NDL INSUFFLATION 14GA 120MM (NEEDLE) ×1 IMPLANT
NEEDLE INSUFFLATION 14GA 120MM (NEEDLE) ×3 IMPLANT
NS IRRIG 1000ML POUR BTL (IV SOLUTION) ×3 IMPLANT
PAD ARMBOARD 7.5X6 YLW CONV (MISCELLANEOUS) ×3 IMPLANT
POUCH SPECIMEN RETRIEVAL 10MM (ENDOMECHANICALS) ×3 IMPLANT
SCISSORS LAP 5X35 DISP (ENDOMECHANICALS) ×3 IMPLANT
SET CHOLANGIOGRAPH 5 50 .035 (SET/KITS/TRAYS/PACK) ×2 IMPLANT
SET IRRIG TUBING LAPAROSCOPIC (IRRIGATION / IRRIGATOR) ×3 IMPLANT
SET TUBE SMOKE EVAC HIGH FLOW (TUBING) ×3 IMPLANT
SLEEVE ENDOPATH XCEL 5M (ENDOMECHANICALS) ×6 IMPLANT
SUT MNCRL AB 4-0 PS2 18 (SUTURE) ×3 IMPLANT
TOWEL OR 17X24 6PK STRL BLUE (TOWEL DISPOSABLE) ×3 IMPLANT
TOWEL OR 17X26 10 PK STRL BLUE (TOWEL DISPOSABLE) ×3 IMPLANT
TRAY LAPAROSCOPIC MC (CUSTOM PROCEDURE TRAY) ×3 IMPLANT
TROCAR XCEL NON-BLD 11X100MML (ENDOMECHANICALS) ×3 IMPLANT
TROCAR XCEL NON-BLD 5MMX100MML (ENDOMECHANICALS) ×3 IMPLANT
WATER STERILE IRR 1000ML POUR (IV SOLUTION) ×3 IMPLANT

## 2019-01-18 NOTE — ED Notes (Signed)
Attempted report 

## 2019-01-18 NOTE — Anesthesia Preprocedure Evaluation (Signed)
Anesthesia Evaluation  Patient identified by MRN, date of birth, ID band Patient awake    Reviewed: Allergy & Precautions, NPO status , Patient's Chart, lab work & pertinent test results  History of Anesthesia Complications Negative for: history of anesthetic complications  Airway Mallampati: I  TM Distance: >3 FB Neck ROM: Full    Dental  (+) Edentulous Upper, Edentulous Lower   Pulmonary COPD,    breath sounds clear to auscultation       Cardiovascular hypertension, Pt. on medications (-) angina(-) Past MI and (-) CHF (-) dysrhythmias  Rhythm:Regular     Neuro/Psych PSYCHIATRIC DISORDERS Depression negative neurological ROS     GI/Hepatic Neg liver ROS, GERD  Medicated and Controlled,  Endo/Other  Hypothyroidism   Renal/GU negative Renal ROS     Musculoskeletal  (+) Arthritis ,   Abdominal   Peds  Hematology  (+) anemia ,   Anesthesia Other Findings   Reproductive/Obstetrics                             Anesthesia Physical Anesthesia Plan  ASA: II  Anesthesia Plan: General   Post-op Pain Management:    Induction: Intravenous  PONV Risk Score and Plan: 3 and Ondansetron and Dexamethasone  Airway Management Planned: Oral ETT  Additional Equipment: None  Intra-op Plan:   Post-operative Plan: Extubation in OR  Informed Consent: I have reviewed the patients History and Physical, chart, labs and discussed the procedure including the risks, benefits and alternatives for the proposed anesthesia with the patient or authorized representative who has indicated his/her understanding and acceptance.     Dental advisory given  Plan Discussed with: CRNA and Surgeon  Anesthesia Plan Comments:         Anesthesia Quick Evaluation

## 2019-01-18 NOTE — Progress Notes (Signed)
Patient transferred back to room, receiving RN at bedside, family at bedside, no questions or concerns from patient, RN or family.  Hermina Barters, RN

## 2019-01-18 NOTE — Progress Notes (Signed)
Day of Surgery   Subjective/Chief Complaint: No longer having pain.    Objective: Vital signs in last 24 hours: Temp:  [97.7 F (36.5 C)-102.9 F (39.4 C)] 97.9 F (36.6 C) (03/17 0837) Pulse Rate:  [70-124] 79 (03/17 0837) Resp:  [12-19] 12 (03/17 0837) BP: (102-164)/(53-76) 127/60 (03/17 0837) SpO2:  [96 %-100 %] 97 % (03/17 0559) Weight:  [51 kg] 51 kg (03/16 2128) Last BM Date: 01/17/19  Intake/Output from previous day: No intake/output data recorded. Intake/Output this shift: No intake/output data recorded.  General appearance: alert and cooperative Resp: clear to auscultation bilaterally Cardio: regular rate and rhythm GI: soft, non-tender; bowel sounds normal; no masses,  no organomegaly Skin: Skin color, texture, turgor normal. No rashes or lesions Neurologic: Grossly normal  Lab Results:  Recent Labs    01/17/19 2047  WBC 7.3  HGB 9.7*  HCT 28.8*  PLT 138*   BMET Recent Labs    01/17/19 2047 01/18/19 0154  NA 133* 131*  K 3.5 3.1*  CL 104 103  CO2 19* 19*  GLUCOSE 134* 127*  BUN 20 17  CREATININE 1.07* 1.02*  CALCIUM 9.0 8.1*   PT/INR Recent Labs    01/17/19 2047  LABPROT 15.1  INR 1.2   ABG No results for input(s): PHART, HCO3 in the last 72 hours.  Invalid input(s): PCO2, PO2  Studies/Results: Dg Chest 2 View  Result Date: 01/17/2019 CLINICAL DATA:  Sepsis, fever EXAM: CHEST - 2 VIEW COMPARISON:  07/31/2016 FINDINGS: There is hyperinflation of the lungs compatible with COPD. Biapical scarring. Heart is normal size. No confluent opacities or effusions. Mild peribronchial thickening. IMPRESSION: Emphysema. Mild chronic bronchitic changes. Biapical scarring. No active disease. Electronically Signed   By: Charlett Nose M.D.   On: 01/17/2019 21:29   Ct Abdomen Pelvis W Contrast  Result Date: 01/17/2019 CLINICAL DATA:  Right upper quadrant abdominal pain with nausea and diarrhea for 3 days. EXAM: CT ABDOMEN AND PELVIS WITH CONTRAST  TECHNIQUE: Multidetector CT imaging of the abdomen and pelvis was performed using the standard protocol following bolus administration of intravenous contrast. CONTRAST:  13mL OMNIPAQUE IOHEXOL 300 MG/ML  SOLN COMPARISON:  None. FINDINGS: Lower chest: No acute findings. Several lung nodules, largest left upper lobe lingula abutting the left heart border measuring 6 mm. There are additional linear and reticular opacities that are likely due to scarring and/or subsegmental atelectasis. Hepatobiliary: Gallbladder is distended. Wall thickening measures up to 8 mm. 1 cm dependent stone or possible polyp lies in the upper gallbladder segment. There is inflammation along the gastrohepatic ligament. No common bile duct dilation. Liver shows mild intrahepatic bile duct dilation. No liver masses or focal lesions. Pancreas: Unremarkable. No pancreatic ductal dilatation or surrounding inflammatory changes. Spleen: Normal in size without focal abnormality. Adrenals/Urinary Tract: No adrenal masses. Mild bilateral renal cortical thinning. Left upper pole cyst, 1.1 cm. No other renal masses. No hydronephrosis. Ureters are normal in course and in caliber. Bladder is unremarkable. Stomach/Bowel: Small hiatal hernia. Stomach otherwise unremarkable. Second portion of the duodenum is compressed by the distended gallbladder. Small bowel and colon are normal in caliber. No wall thickening or inflammation. There are scattered left colon diverticula. No diverticulitis. No evidence of appendicitis. Vascular/Lymphatic: Aortic atherosclerosis. No enlarged abdominal or pelvic lymph nodes. Reproductive: Uterus and bilateral adnexa are unremarkable. Other: No abdominal wall hernia.  No ascites. Musculoskeletal: Mild to moderate chronic compression fracture of L2 no acute fracture. No osteoblastic or osteolytic lesions. IMPRESSION: 1. Acute cholecystitis. 2. No  other acute abnormality within the abdomen or pelvis. 3. Small lung base nodules. No  follow-up needed if patient is low-risk (and has no known or suspected primary neoplasm). Non-contrast chest CT can be considered in 12 months if patient is high-risk. This recommendation follows the consensus statement: Guidelines for Management of Incidental Pulmonary Nodules Detected on CT Images: From the Fleischner Society 2017; Radiology 2017; 284:228-243. Electronically Signed   By: Amie Portland M.D.   On: 01/17/2019 23:03    Anti-infectives: Anti-infectives (From admission, onward)   Start     Dose/Rate Route Frequency Ordered Stop   01/18/19 1000  ceFEPIme (MAXIPIME) 1 g in sodium chloride 0.9 % 100 mL IVPB     1 g 200 mL/hr over 30 Minutes Intravenous Every 12 hours 01/17/19 2150     01/18/19 0600  metroNIDAZOLE (FLAGYL) IVPB 500 mg     500 mg 100 mL/hr over 60 Minutes Intravenous Every 8 hours 01/17/19 2356     01/17/19 2115  ceFEPIme (MAXIPIME) 2 g in sodium chloride 0.9 % 100 mL IVPB     2 g 200 mL/hr over 30 Minutes Intravenous  Once 01/17/19 2109 01/17/19 2341   01/17/19 2115  metroNIDAZOLE (FLAGYL) IVPB 500 mg     500 mg 100 mL/hr over 60 Minutes Intravenous  Once 01/17/19 2109 01/17/19 2342      Assessment/Plan: Acute cholecystitis Elevated total bilirubin- stable at 3.1. Other enzymes downtrending.  I had a very long discussion with patient, her daughter and son in law. We discussed the anatomy of the biliary tract and the possibility that she has passed or has a partially obstructing choledocholithiasis, vs compressive etiology from distended gallbladder vs cholecystitis causing her elevated bilirubin. We discussed option of following the labs another day, proceeding with GI consult and then MRCP +/- ERCP, in both scenarios lap chole is recommended before discharge. Also discussed option of lap chole with IOC and possible need for subsequent procedure if choledocholithiasis confirmed. I recommend proceeding with laparoscopic cholecystectomy with cholangiogram. Discussed  risks of surgery including bleeding, pain, scarring, intraabdominal injury specifically to the common bile duct and sequelae such as need for ERCP or major surgery to repair, bile leak, injury to bowel or liver, conversion to open surgery, blood clot, pneumonia, heart attack, stroke, failure to resolve symptoms, and death.  Questions welcomed and answered. They are agreeable to proceed with surgery.   HTN HLD DM- diet controlled Reflux Macular degeneration Hypothyroidism Left femoral fx s/p repair 2011   LOS: 0 days    Berna Bue 01/18/2019

## 2019-01-18 NOTE — Progress Notes (Signed)
[facility] PROGRESS NOTE  Ariya W Stangl MRN:[id number] DOB: 12/12/1927 DOA: 01/17/2019 PCP: McKeown, William, MD   Narrative: 85-year-old Caucasian female HTN HLD DM diet controlled Reflux Macular degeneration Hypothyroidism Left femoral fracture status post repair 2011  Admitted 3/17 a.m. with abdominal pain, fever and right upper quadrant pain CT scan showed cholecystitis T-max 102.9 LFTs 100 range  A & Plan Acute cholecystitis Continue cefepime Flagyl and continue IV fluids at this time 100 cc/H We will defer further planning to general surgery Repeat lipase LFTs a.m.-it does appear that patient will go for cholecystectomy later today under Dr. Conner and she is agreeable to taking over care of the patient and we can follow peripherally for medical issues subsequent to surgery Hypertension Continue Norvasc 2.5 at bedtime Bipolar Continue Elavil 50 at bedtime and Xanax half tablets 0.25 as needed Hypothyroidism Continue Synthroid 125 mcg every morning BMI 19 Nutritionist to see SCDs Inpatient Pending further input from general surgery No family at bedside   Samtani, MD  [facility] Via amion app OR [email or web address] 7PM-7AM contact night coverage as above 01/18/2019, 8:10 AM  LOS: 0 days   Consultants:  General surgery  Procedures:  No  Antimicrobials:  Cefepime and Flagyl  Interval history/Subjective: Awake pleasant very coherent no distress no pain at this time  Objective:  Vitals:  Vitals:   01/18/19 0113 01/18/19 0559  BP: (!) 131/58 (!) 102/53  Pulse: 91 70  Resp: 17 19  Temp: 98.6 F (37 C) 97.7 F (36.5 C)  SpO2: 100% 97%    Exam:  Awake alert pleasant no distress EOMI NCAT frail Chest clinically clear no added sound Abdomen soft no rebound no guarding No lower extremity edema Neurologically intact no focal deficit   I have personally reviewed the following:  DATA   Labs:  Sodium 131 potassium 3.1  BUN/creatinine 17/1.0 alk phos down from 160-->140  AST ALT 124/149 down to 89/1 1 3  Bilirubin 3.1 and stable  Lipase 39  Lactic acid down from 1.2-0.9  Scheduled Meds: . amitriptyline  25 mg Oral QHS  . amLODipine  2.5 mg Oral QHS  . levothyroxine  125 mcg Oral Q0600  . pantoprazole  80 mg Oral Daily   Continuous Infusions: . sodium chloride 100 mL/hr at 01/18/19 0111  . ceFEPime (MAXIPIME) IV    . metronidazole 500 mg (01/18/19 0608)    Principal Problem:   Acute cholecystitis Active Problems:   Hypertension   COPD (chronic obstructive pulmonary disease) (HCC)   LOS: 0 days          

## 2019-01-18 NOTE — Progress Notes (Signed)
Patient arrived to room via bed. Patient transferred with assist, no complaints of pain patient orientated to unit. Vital stable will continue to monitor.

## 2019-01-18 NOTE — Transfer of Care (Signed)
Immediate Anesthesia Transfer of Care Note  Patient: Shelby Bradley  Procedure(s) Performed: LAPAROSCOPIC CHOLECYSTECTOMY WITH INTRAOPERATIVE CHOLANGIOGRAM (N/A )  Patient Location: PACU  Anesthesia Type:General  Level of Consciousness: drowsy and confused  Airway & Oxygen Therapy: Patient Spontanous Breathing and Patient connected to face mask oxygen  Post-op Assessment: Report given to RN and Post -op Vital signs reviewed and stable  Post vital signs: Reviewed and stable  Last Vitals:  Vitals Value Taken Time  BP    Temp    Pulse 108 01/18/2019 12:05 PM  Resp 11 01/18/2019 12:05 PM  SpO2 100 % 01/18/2019 12:05 PM  Vitals shown include unvalidated device data.  Last Pain:  Vitals:   01/18/19 0837  TempSrc: Oral  PainSc:          Complications: No apparent anesthesia complications

## 2019-01-18 NOTE — ED Notes (Signed)
ED TO INPATIENT HANDOFF REPORT  ED Nurse Name and Phone #: Romeo Apple [phone]  S Name/Age/Gender Shelby Bradley 85 y.o. female Room/Bed: 018C/018C  Code Status   Code Status: Full Code  Home/SNF/Other Home Patient oriented to: self, place, time and situation Is this baseline? Yes   Triage Complete: Triage complete  Chief Complaint pain on left side  Triage Note Pt reports RUQ abd pain, nausea, diarrhea since Saturday. Poor historian. Febrile in triage.    Allergies Allergies  Allergen Reactions  . Amlodipine Swelling    Swelling/edema  . Norvasc [Amlodipine Besylate]   . Sulfa Antibiotics     Level of Care/Admitting Diagnosis ED Disposition    ED Disposition Condition Comment   Admit  Hospital Area: [facility] [[id number]]  Level of Care: Med-Surg [16]  Diagnosis: Acute cholecystitis [575.0.ICD-9-CM]  Admitting Physician: Hillary Bow [[id number]]  Attending Physician: Hillary Bow [[id number]]  Estimated length of stay: past midnight tomorrow  Certification:: I certify this patient will need inpatient services for at least 2 midnights  PT Class (Do Not Modify): Inpatient [101]  PT Acc Code (Do Not Modify): Private [1]       B Medical/Surgery History Past Medical History:  Diagnosis Date  . Allergy   . Anemia   . Anxiety   . DDD (degenerative disc disease), lumbar   . Depression   . GERD (gastroesophageal reflux disease)   . Hyperlipidemia   . Hypertension   . OA (osteoarthritis) of knee    right knee  . Pre-diabetes   . Thyroid disease   . Vitamin D deficiency    Past Surgical History:  Procedure Laterality Date  . CATARACT EXTRACTION     both  . KNEE ARTHROSCOPY Left      A IV Location/Drains/Wounds Patient Lines/Drains/Airways Status   Active Line/Drains/Airways    Name:   Placement date:   Placement time:   Site:   Days:   Peripheral IV 01/17/19 Right Forearm   01/17/19    2115    Forearm   1          Intake/Output Last  24 hours No intake or output data in the 24 hours ending 01/18/19 0036  Labs/Imaging Results for orders placed or performed during the hospital encounter of 01/17/19 (from the past 48 hour(s))  Urinalysis, Routine w reflex microscopic     Status: Abnormal   Collection Time: 01/17/19  8:42 PM  Result Value Ref Range   Color, Urine YELLOW YELLOW   APPearance CLEAR CLEAR   Specific Gravity, Urine 1.011 1.005 - 1.030   pH 5.0 5.0 - 8.0   Glucose, UA NEGATIVE NEGATIVE mg/dL   Hgb urine dipstick LARGE (A) NEGATIVE   Bilirubin Urine NEGATIVE NEGATIVE   Ketones, ur 5 (A) NEGATIVE mg/dL   Protein, ur NEGATIVE NEGATIVE mg/dL   Nitrite NEGATIVE NEGATIVE   Leukocytes,Ua SMALL (A) NEGATIVE   RBC / HPF 11-20 0 - 5 RBC/hpf   WBC, UA 11-20 0 - 5 WBC/hpf   Bacteria, UA RARE (A) NONE SEEN   Squamous Epithelial / LPF 0-5 0 - 5   Mucus PRESENT     Comment: Performed at [facility], [address], [place], [place] [address]  Comprehensive metabolic panel     Status: Abnormal   Collection Time: 01/17/19  8:47 PM  Result Value Ref Range   Sodium 133 (L) 135 - 145 mmol/L   Potassium 3.5 3.5 - 5.1 mmol/L  Chloride 104 98 - 111 mmol/L   CO2 19 (L) 22 - 32 mmol/L   Glucose, Bld 134 (H) 70 - 99 mg/dL   BUN 20 8 - 23 mg/dL   Creatinine, Ser 9.60 (H) 0.44 - 1.00 mg/dL   Calcium 9.0 8.9 - 45.4 mg/dL   Total Protein 6.4 (L) 6.5 - 8.1 g/dL   Albumin 3.4 (L) 3.5 - 5.0 g/dL   AST 098 (H) 15 - 41 U/L   ALT 149 (H) 0 - 44 U/L   Alkaline Phosphatase 160 (H) 38 - 126 U/L   Total Bilirubin 3.1 (H) 0.3 - 1.2 mg/dL   GFR calc non Af Amer 46 (L) >60 mL/min   GFR calc Af Amer 54 (L) >60 mL/min   Anion gap 10 5 - 15    Comment: Performed at [facility], [address], [place], [place] [address]  Lactic acid, plasma     Status: None   Collection Time: 01/17/19  8:47 PM  Result Value Ref Range   Lactic Acid, Venous 1.2 0.5 - 1.9 mmol/L    Comment: Performed at [facility], [address], [place], [place] [address]  CBC with Differential     Status: Abnormal   Collection Time: 01/17/19  8:47 PM  Result Value Ref Range   WBC 7.3 4.0 - 10.5 K/uL   RBC 3.28 (L) 3.87 - 5.11 MIL/uL   Hemoglobin 9.7 (L) 12.0 - 15.0 g/dL   HCT 62.1 (L) 30.8 - 65.7 %   MCV 87.8 80.0 - 100.0 fL   MCH 29.6 26.0 - 34.0 pg   MCHC 33.7 30.0 - 36.0 g/dL   RDW 84.6 96.2 - 95.2 %   Platelets 138 (L) 150 - 400 K/uL   nRBC 0.0 0.0 - 0.2 %   Neutrophils Relative % 94 %   Neutro Abs 6.8 1.7 - 7.7 K/uL   Lymphocytes Relative 2 %   Lymphs Abs 0.1 (L) 0.7 - 4.0 K/uL   Monocytes Relative 2 %   Monocytes Absolute 0.2 0.1 - 1.0 K/uL   Eosinophils Relative 2 %   Eosinophils Absolute 0.1 0.0 - 0.5 K/uL   Basophils Relative 0 %   Basophils Absolute 0.0 0.0 - 0.1 K/uL   Immature Granulocytes 0 %   Abs Immature Granulocytes 0.03 0.00 - 0.07 K/uL    Comment: Performed at [facility], [address], [place], [place] [address]  Protime-INR     Status: None   Collection Time: 01/17/19  8:47 PM  Result Value Ref Range   Prothrombin Time 15.1 11.4 - 15.2 seconds   INR 1.2 0.8 - 1.2    Comment: (NOTE) INR goal varies based on device and disease states. Performed at [facility], [address] [place], [place] [address]   Lipase, blood     Status: Abnormal   Collection Time: 01/17/19  8:57 PM  Result Value Ref Range   Lipase 59 (H) 11 - 51 U/L    Comment: Performed at [facility], [address], [place], [place] [address]  Influenza panel by PCR (type A & B)     Status: None   Collection Time: 01/17/19  9:32 PM  Result Value Ref Range   Influenza A By PCR NEGATIVE NEGATIVE   Influenza B By PCR NEGATIVE NEGATIVE    Comment: (NOTE) The Xpert Xpress Flu assay is intended as an aid in the diagnosis of  influenza  and should not be used as a sole basis for treatment.  This  assay is FDA approved for nasopharyngeal swab specimens only. Nasal  washings and aspirates are unacceptable for  Xpert Xpress Flu testing. Performed at [facility], [address] [place], [place] [address]    Dg Chest 2 View  Result Date: 01/17/2019 CLINICAL DATA:  Sepsis, fever EXAM: CHEST - 2 VIEW COMPARISON:  07/31/2016 FINDINGS: There is hyperinflation of the lungs compatible with COPD. Biapical scarring. Heart is normal size. No confluent opacities or effusions. Mild peribronchial thickening. IMPRESSION: Emphysema. Mild chronic bronchitic changes. Biapical scarring. No active disease. Electronically Signed   By: Charlett Nose M.D.   On: 01/17/2019 21:29   Ct Abdomen Pelvis W Contrast  Result Date: 01/17/2019 CLINICAL DATA:  Right upper quadrant abdominal pain with nausea and diarrhea for 3 days. EXAM: CT ABDOMEN AND PELVIS WITH CONTRAST TECHNIQUE: Multidetector CT imaging of the abdomen and pelvis was performed using the standard protocol following bolus administration of intravenous contrast. CONTRAST:  72mL OMNIPAQUE IOHEXOL 300 MG/ML  SOLN COMPARISON:  None. FINDINGS: Lower chest: No acute findings. Several lung nodules, largest left upper lobe lingula abutting the left heart border measuring 6 mm. There are additional linear and reticular opacities that are likely due to scarring and/or subsegmental atelectasis. Hepatobiliary: Gallbladder is distended. Wall thickening measures up to 8 mm. 1 cm dependent stone or possible polyp lies in the upper gallbladder segment. There is inflammation along the gastrohepatic ligament. No common bile duct dilation. Liver shows mild intrahepatic bile duct dilation. No liver masses or focal lesions. Pancreas: Unremarkable. No pancreatic ductal dilatation or surrounding inflammatory changes. Spleen: Normal in size without focal abnormality. Adrenals/Urinary Tract: No adrenal masses. Mild bilateral renal cortical thinning. Left upper pole cyst, 1.1 cm. No other renal masses. No hydronephrosis. Ureters are normal in course and in caliber. Bladder is unremarkable.  Stomach/Bowel: Small hiatal hernia. Stomach otherwise unremarkable. Second portion of the duodenum is compressed by the distended gallbladder. Small bowel and colon are normal in caliber. No wall thickening or inflammation. There are scattered left colon diverticula. No diverticulitis. No evidence of appendicitis. Vascular/Lymphatic: Aortic atherosclerosis. No enlarged abdominal or pelvic lymph nodes. Reproductive: Uterus and bilateral adnexa are unremarkable. Other: No abdominal wall hernia.  No ascites. Musculoskeletal: Mild to moderate chronic compression fracture of L2 no acute fracture. No osteoblastic or osteolytic lesions. IMPRESSION: 1. Acute cholecystitis. 2. No other acute abnormality within the abdomen or pelvis. 3. Small lung base nodules. No follow-up needed if patient is low-risk (and has no known or suspected primary neoplasm). Non-contrast chest CT can be considered in 12 months if patient is high-risk. This recommendation follows the consensus statement: Guidelines for Management of Incidental Pulmonary Nodules Detected on CT Images: From the Fleischner Society 2017; Radiology 2017; 284:228-243. Electronically Signed   By: Amie Portland M.D.   On: 01/17/2019 23:03    Pending Labs Unresulted Labs (From admission, onward)    Start     Ordered   01/18/19 0500  Comprehensive metabolic panel  Tomorrow morning,   R     01/18/19 0011   01/17/19 2108  Blood Culture (routine x 2)  BLOOD CULTURE X 2,   STAT     01/17/19 2109   01/17/19 2108  Urine culture  ONCE - STAT,   STAT     01/17/19 2109   01/17/19 2039  Lactic acid, plasma  Now then every 2 hours,   STAT  01/17/19 2038   01/17/19 2039  Culture, blood (Routine x 2)  BLOOD CULTURE X 2,   STAT     01/17/19 2038          Vitals/Pain Today's Vitals   01/17/19 2128 01/17/19 2129 01/17/19 2348 01/17/19 2349  BP:    126/76  Pulse:    93  Resp:    16  Temp:      TempSrc:      SpO2:    99%  Weight: 51 kg     Height:  (1.626  m)     PainSc:  0-No pain 0-No pain     Isolation Precautions Droplet precaution  Medications Medications  0.9 %  sodium chloride infusion ( Intravenous New Bag/Given 01/17/19 2348)  ceFEPIme (MAXIPIME) 1 g in sodium chloride 0.9 % 100 mL IVPB (has no administration in time range)  metroNIDAZOLE (FLAGYL) IVPB 500 mg (has no administration in time range)  amLODipine (NORVASC) tablet 2.5 mg (has no administration in time range)  pantoprazole (PROTONIX) EC tablet 80 mg (has no administration in time range)  levothyroxine (SYNTHROID, LEVOTHROID) tablet 125 mcg (has no administration in time range)  amitriptyline (ELAVIL) tablet 25 mg (has no administration in time range)  ALPRAZolam (XANAX) tablet 0.25 mg (has no administration in time range)  acetaminophen (TYLENOL) tablet 650 mg (has no administration in time range)    Or  acetaminophen (TYLENOL) suppository 650 mg (has no administration in time range)  ondansetron (ZOFRAN) tablet 4 mg (has no administration in time range)    Or  ondansetron (ZOFRAN) injection 4 mg (has no administration in time range)  sodium chloride flush (NS) 0.9 % injection 3 mL (3 mLs Intravenous Given 01/17/19 2127)  ceFEPIme (MAXIPIME) 2 g in sodium chloride 0.9 % 100 mL IVPB (0 g Intravenous Stopped 01/17/19 2341)  metroNIDAZOLE (FLAGYL) IVPB 500 mg (0 mg Intravenous Stopped 01/17/19 2342)  acetaminophen (TYLENOL) tablet 1,000 mg (1,000 mg Oral Given 01/17/19 2127)  iohexol (OMNIPAQUE) 300 MG/ML solution 80 mL (80 mLs Intravenous Contrast Given 01/17/19 2232)    Mobility walks with person assist     Focused Assessments Gastro Cholecystitis   R Recommendations: See Admitting Provider Note  Report given to:   Additional Notes: N/A

## 2019-01-18 NOTE — H&P (Signed)
History and Physical    Shelby Bradley XJD:[id number] DOB: 1927/11/27 DOA: 01/17/2019  PCP: Lucky Cowboy, MD  Patient coming from: Home  I have personally briefly reviewed patient's old medical records in [facility]  Chief Complaint: Abd pain  HPI: Shelby MASHAW is a 85 y.o. Bradley with medical history significant of HTN.  Patient presents to the ED with c/o fever and RUQ abd pain.  Patient had diarrhea yesterday and today.  No travel no sick contacts, taking imodium.  Initially had RUQ pain which has now resolved and only present intermittently.  No cough, no dysuria.  T of 102 at home.  Called PCP who offered to see her in AM but daughter brought her to ED due to fever.   ED Course: Abd pain resolved at this point.  Tm 102.9  LFTs slightly elevated in the 100s.  CT reveals acute cholecystitis.   Review of Systems: As per HPI otherwise 10 point review of systems negative.   Past Medical History:  Diagnosis Date  . Allergy   . Anemia   . Anxiety   . DDD (degenerative disc disease), lumbar   . Depression   . GERD (gastroesophageal reflux disease)   . Hyperlipidemia   . Hypertension   . OA (osteoarthritis) of knee    right knee  . Pre-diabetes   . Thyroid disease   . Vitamin D deficiency     Past Surgical History:  Procedure Laterality Date  . CATARACT EXTRACTION     both  . KNEE ARTHROSCOPY Left      reports that she has never smoked. She has never used smokeless tobacco. She reports that she does not drink alcohol or use drugs.  Allergies  Allergen Reactions  . Amlodipine Swelling    Swelling/edema  . Norvasc [Amlodipine Besylate]   . Sulfa Antibiotics     Family History  Problem Relation Age of Onset  . Hypertension Mother   . COPD Father   . Heart attack Brother   . Heart attack Brother      Prior to Admission medications   Medication Sig Start Date End Date Taking? Authorizing Provider  ALPRAZolam Prudy Feeler) 0.5 MG tablet Take 1/2  tablet at hour of sleep ONLY if needed 04/29/18   Lucky Cowboy, MD  amitriptyline (ELAVIL) 50 MG tablet Take 1 tablet (50 mg total) by mouth at bedtime. 02/03/18   Quentin Mulling, PA-C  amLODipine (NORVASC) 2.5 MG tablet TAKE 1 TABLET BY MOUTH EVERY DAY 12/29/18   Lucky Cowboy, MD  Cholecalciferol (VITAMIN D-3) 5000 UNITS TABS Take 5,000 Units by mouth daily.    [provider]  cyanocobalamin (,VITAMIN B-12,) 1000 MCG/ML injection INJECT 1 MILLILITER INTO THE SKIN EVERY 30 DAYS 11/01/18   Judd Gaudier, NP  levothyroxine (SYNTHROID, LEVOTHROID) 125 MCG tablet TAKE 1 TABLET BY MOUTH BEFORE BREAKFAST OR AS DIRECTED. 10/26/18   Judd Gaudier, NP  omeprazole (PRILOSEC) 40 MG capsule TAKE 1 CAPSULE BY MOUTH EVERY DAY 02/03/18   Quentin Mulling, PA-C    Physical Exam: Vitals:   01/17/19 2024 01/17/19 2100 01/17/19 2128 01/17/19 2349  BP: (!) 164/69 (!) 142/65  126/76  Pulse: (!) 124 (!) 105  93  Resp: 18 18  16   Temp: (!) 102.9 F (39.4 C)     TempSrc: Oral     SpO2: 96% 96%  99%  Weight:   51 kg   Height:   5\' 4"  (1.626 m)     Constitutional: NAD, calm,  comfortable Eyes: PERRL, lids and conjunctivae normal ENMT: Mucous membranes are moist. Posterior pharynx clear of any exudate or lesions.Normal dentition.  Neck: normal, supple, no masses, no thyromegaly Respiratory: clear to auscultation bilaterally, no wheezing, no crackles. Normal respiratory effort. No accessory muscle use.  Cardiovascular: Regular rate and rhythm, no murmurs / rubs / gallops. No extremity edema. 2+ pedal pulses. No carotid bruits.  Abdomen: no tenderness, no masses palpated. No hepatosplenomegaly. Bowel sounds positive.  Musculoskeletal: no clubbing / cyanosis. No joint deformity upper and lower extremities. Good ROM, no contractures. Normal muscle tone.  Skin: no rashes, lesions, ulcers. No induration Neurologic: CN 2-12 grossly intact. Sensation intact, DTR normal. Strength 5/5 in all 4.   Psychiatric: Normal judgment and insight. Alert and oriented x 3. Normal mood.    Labs on Admission: I have personally reviewed following labs and imaging studies  CBC: Recent Labs  Lab 01/17/19 2047  WBC 7.3  NEUTROABS 6.8  HGB 9.7*  HCT 28.8*  MCV 87.8  PLT 138*   Basic Metabolic Panel: Recent Labs  Lab 01/17/19 2047  NA 133*  K 3.5  CL 104  CO2 19*  GLUCOSE 134*  BUN 20  CREATININE 1.07*  CALCIUM 9.0   GFR: Estimated Creatinine Clearance: 29.3 mL/min (A) (by C-G formula based on SCr of 1.07 mg/dL (H)). Liver Function Tests: Recent Labs  Lab 01/17/19 2047  AST 124*  ALT 149*  ALKPHOS 160*  BILITOT 3.1*  PROT 6.4*  ALBUMIN 3.4*   Recent Labs  Lab 01/17/19 2057  LIPASE 59*   No results for input(s): AMMONIA in the last 168 hours. Coagulation Profile: Recent Labs  Lab 01/17/19 2047  INR 1.2   Cardiac Enzymes: No results for input(s): CKTOTAL, CKMB, CKMBINDEX, TROPONINI in the last 168 hours. BNP (last 3 results) No results for input(s): PROBNP in the last 8760 hours. HbA1C: No results for input(s): HGBA1C in the last 72 hours. CBG: No results for input(s): GLUCAP in the last 168 hours. Lipid Profile: No results for input(s): CHOL, HDL, LDLCALC, TRIG, CHOLHDL, LDLDIRECT in the last 72 hours. Thyroid Function Tests: No results for input(s): TSH, T4TOTAL, FREET4, T3FREE, THYROIDAB in the last 72 hours. Anemia Panel: No results for input(s): VITAMINB12, FOLATE, FERRITIN, TIBC, IRON, RETICCTPCT in the last 72 hours. Urine analysis:    Component Value Date/Time   COLORURINE YELLOW 01/17/2019 2042   APPEARANCEUR CLEAR 01/17/2019 2042   LABSPEC 1.011 01/17/2019 2042   PHURINE 5.0 01/17/2019 2042   GLUCOSEU NEGATIVE 01/17/2019 2042   HGBUR LARGE (A) 01/17/2019 2042   BILIRUBINUR NEGATIVE 01/17/2019 2042   KETONESUR 5 (A) 01/17/2019 2042   PROTEINUR NEGATIVE 01/17/2019 2042   UROBILINOGEN 0.2 06/29/2014 1436   NITRITE NEGATIVE 01/17/2019 2042    LEUKOCYTESUR SMALL (A) 01/17/2019 2042    Radiological Exams on Admission: Dg Chest 2 View  Result Date: 01/17/2019 CLINICAL DATA:  Sepsis, fever EXAM: CHEST - 2 VIEW COMPARISON:  07/31/2016 FINDINGS: There is hyperinflation of the lungs compatible with COPD. Biapical scarring. Heart is normal size. No confluent opacities or effusions. Mild peribronchial thickening. IMPRESSION: Emphysema. Mild chronic bronchitic changes. Biapical scarring. No active disease. Electronically Signed   By: Charlett Nose M.D.   On: 01/17/2019 21:29   Ct Abdomen Pelvis W Contrast  Result Date: 01/17/2019 CLINICAL DATA:  Right upper quadrant abdominal pain with nausea and diarrhea for 3 days. EXAM: CT ABDOMEN AND PELVIS WITH CONTRAST TECHNIQUE: Multidetector CT imaging of the abdomen and pelvis was performed using  the standard protocol following bolus administration of intravenous contrast. CONTRAST:  62mL OMNIPAQUE IOHEXOL 300 MG/ML  SOLN COMPARISON:  None. FINDINGS: Lower chest: No acute findings. Several lung nodules, largest left upper lobe lingula abutting the left heart border measuring 6 mm. There are additional linear and reticular opacities that are likely due to scarring and/or subsegmental atelectasis. Hepatobiliary: Gallbladder is distended. Wall thickening measures up to 8 mm. 1 cm dependent stone or possible polyp lies in the upper gallbladder segment. There is inflammation along the gastrohepatic ligament. No common bile duct dilation. Liver shows mild intrahepatic bile duct dilation. No liver masses or focal lesions. Pancreas: Unremarkable. No pancreatic ductal dilatation or surrounding inflammatory changes. Spleen: Normal in size without focal abnormality. Adrenals/Urinary Tract: No adrenal masses. Mild bilateral renal cortical thinning. Left upper pole cyst, 1.1 cm. No other renal masses. No hydronephrosis. Ureters are normal in course and in caliber. Bladder is unremarkable. Stomach/Bowel: Small hiatal hernia.  Stomach otherwise unremarkable. Second portion of the duodenum is compressed by the distended gallbladder. Small bowel and colon are normal in caliber. No wall thickening or inflammation. There are scattered left colon diverticula. No diverticulitis. No evidence of appendicitis. Vascular/Lymphatic: Aortic atherosclerosis. No enlarged abdominal or pelvic lymph nodes. Reproductive: Uterus and bilateral adnexa are unremarkable. Other: No abdominal wall hernia.  No ascites. Musculoskeletal: Mild to moderate chronic compression fracture of L2 no acute fracture. No osteoblastic or osteolytic lesions. IMPRESSION: 1. Acute cholecystitis. 2. No other acute abnormality within the abdomen or pelvis. 3. Small lung base nodules. No follow-up needed if patient is low-risk (and has no known or suspected primary neoplasm). Non-contrast chest CT can be considered in 12 months if patient is high-risk. This recommendation follows the consensus statement: Guidelines for Management of Incidental Pulmonary Nodules Detected on CT Images: From the Fleischner Society 2017; Radiology 2017; 284:228-243. Electronically Signed   By: Amie Portland M.D.   On: 01/17/2019 23:03    EKG: Independently reviewed.  Assessment/Plan Principal Problem:   Acute cholecystitis Active Problems:   Hypertension   COPD (chronic obstructive pulmonary disease) (HCC)    1. Acute cholecystitis - 1. Will leave on cefepime and flagyl for now 2. Dr. Janee Morn seeing at bedside 1. NPO 2. Repeat CMP in AM 3. Tylenol PRN fever 4. No abd pain currently 5. IVF: NS at 100 cc/hr 2. HTN - 1. Continue home norvasc 3. ? Of COPD - 1. "emphysema" seen on CXR 2. Asymptomatic, on no home meds  DVT prophylaxis: SCDs Code Status: Full Family Communication: Daughter at bedside Disposition Plan: Home after admit Consults called: Dr. Janee Morn gen surg Admission status: Admit to inpatient  Severity of Illness: The appropriate patient status for this  patient is INPATIENT. Inpatient status is judged to be reasonable and necessary in order to provide the required intensity of service to ensure the patient's safety. The patient's presenting symptoms, physical exam findings, and initial radiographic and laboratory data in the context of their chronic comorbidities is felt to place them at high risk for further clinical deterioration. Furthermore, it is not anticipated that the patient will be medically stable for discharge from the hospital within 2 midnights of admission. The following factors support the patient status of inpatient.   IP status for treatment of acute cholecystitis.   * I certify that at the point of admission it is my clinical judgment that the patient will require inpatient hospital care spanning beyond 2 midnights from the point of admission due to high intensity of service,  high risk for further deterioration and high frequency of surveillance required.*    GARDNER, JARED M. DO [facility]  How to contact the [facility] Attending or Consulting provider 7A - 7P or covering provider during after hours 7P -7A, for this patient?  1. Check the care team in [facility] and look for a) attending/consulting [facility] provider listed and b) the [facility] team listed 2. Log into [email or web address]  Amion Physician Scheduling and messaging for groups and whole hospitals  On call and physician scheduling software for group practices, residents, hospitalists and other medical providers for call, clinic, rotation and shift schedules. On[employer] is a hospital-wide system for scheduling doctors and paging doctors on call. EasyPlot is for scientific plotting and data analysis.  [email or web address]  and use [place]'s universal password to access. If you do not have the password, please contact the hospital operator.  3. Locate the [facility] provider you are looking for under [facility] and page to a number that you can be directly reached. 4. If you still have  difficulty reaching the provider, please page the [facility] (Director on Call) for the Hospitalists listed on amion for assistance.  01/18/2019, 12:24 AM

## 2019-01-18 NOTE — Consult Note (Signed)
Reason for Consult:Cholecystitis Referring Physician: Saphire Buff is an 85 y.o. female.  HPI: 85yo F developed some right upper quadrant pressure under her ribs starting on Saturday.  This persisted and became more painful.  She also had some diarrhea.  She had a fever today and her daughter brought her to the emergency department.  Work-up here demonstrates normal white blood cell count, elevated liver function tests, and CT scan of the abdomen and pelvis shows acute cholecystitis.  I was asked to see her for surgical treatment.  She reports that the pain has almost completely eased off at this time.  She denies previous episodes of this pain.  Past Medical History:  Diagnosis Date  . Allergy   . Anemia   . Anxiety   . DDD (degenerative disc disease), lumbar   . Depression   . GERD (gastroesophageal reflux disease)   . Hyperlipidemia   . Hypertension   . OA (osteoarthritis) of knee    right knee  . Pre-diabetes   . Thyroid disease   . Vitamin D deficiency     Past Surgical History:  Procedure Laterality Date  . CATARACT EXTRACTION     both  . KNEE ARTHROSCOPY Left     Family History  Problem Relation Age of Onset  . Hypertension Mother   . COPD Father   . Heart attack Brother   . Heart attack Brother     Social History:  reports that she has never smoked. She has never used smokeless tobacco. She reports that she does not drink alcohol or use drugs.  Allergies:  Allergies  Allergen Reactions  . Amlodipine Swelling    Swelling/edema  . Norvasc [Amlodipine Besylate]   . Sulfa Antibiotics     Medications: I have reviewed the patient's current medications.  Results for orders placed or performed during the hospital encounter of 01/17/19 (from the past 48 hour(s))  Urinalysis, Routine w reflex microscopic     Status: Abnormal   Collection Time: 01/17/19  8:42 PM  Result Value Ref Range   Color, Urine YELLOW YELLOW   APPearance CLEAR CLEAR    Specific Gravity, Urine 1.011 1.005 - 1.030   pH 5.0 5.0 - 8.0   Glucose, UA NEGATIVE NEGATIVE mg/dL   Hgb urine dipstick LARGE (A) NEGATIVE   Bilirubin Urine NEGATIVE NEGATIVE   Ketones, ur 5 (A) NEGATIVE mg/dL   Protein, ur NEGATIVE NEGATIVE mg/dL   Nitrite NEGATIVE NEGATIVE   Leukocytes,Ua SMALL (A) NEGATIVE   RBC / HPF 11-20 0 - 5 RBC/hpf   WBC, UA 11-20 0 - 5 WBC/hpf   Bacteria, UA RARE (A) NONE SEEN   Squamous Epithelial / LPF 0-5 0 - 5   Mucus PRESENT     Comment: Performed at [facility], [address] [place], [place] [address]  Comprehensive metabolic panel     Status: Abnormal   Collection Time: 01/17/19  8:47 PM  Result Value Ref Range   Sodium 133 (L) 135 - 145 mmol/L   Potassium 3.5 3.5 - 5.1 mmol/L   Chloride 104 98 - 111 mmol/L   CO2 19 (L) 22 - 32 mmol/L   Glucose, Bld 134 (H) 70 - 99 mg/dL   BUN 20 8 - 23 mg/dL   Creatinine, Ser 1.30 (H) 0.44 - 1.00 mg/dL   Calcium 9.0 8.9 - 86.5 mg/dL   Total Protein 6.4 (L) 6.5 - 8.1 g/dL   Albumin 3.4 (L) 3.5 - 5.0  g/dL   AST 272 (H) 15 - 41 U/L   ALT 149 (H) 0 - 44 U/L   Alkaline Phosphatase 160 (H) 38 - 126 U/L   Total Bilirubin 3.1 (H) 0.3 - 1.2 mg/dL   GFR calc non Af Amer 46 (L) >60 mL/min   GFR calc Af Amer 54 (L) >60 mL/min   Anion gap 10 5 - 15    Comment: Performed at [facility], [address], [place], [place] [address]  Lactic acid, plasma     Status: None   Collection Time: 01/17/19  8:47 PM  Result Value Ref Range   Lactic Acid, Venous 1.2 0.5 - 1.9 mmol/L    Comment: Performed at [facility], [address] [place], [place] [address]  CBC with Differential     Status: Abnormal   Collection Time: 01/17/19  8:47 PM  Result Value Ref Range   WBC 7.3 4.0 - 10.5 K/uL   RBC 3.28 (L) 3.87 - 5.11 MIL/uL   Hemoglobin 9.7 (L) 12.0 - 15.0 g/dL   HCT 42.5 (L) 95.6 - 38.7 %   MCV 87.8 80.0 - 100.0 fL   MCH 29.6 26.0 - 34.0 pg   MCHC 33.7 30.0 - 36.0 g/dL   RDW 56.4 33.2 - 95.1 %    Platelets 138 (L) 150 - 400 K/uL   nRBC 0.0 0.0 - 0.2 %   Neutrophils Relative % 94 %   Neutro Abs 6.8 1.7 - 7.7 K/uL   Lymphocytes Relative 2 %   Lymphs Abs 0.1 (L) 0.7 - 4.0 K/uL   Monocytes Relative 2 %   Monocytes Absolute 0.2 0.1 - 1.0 K/uL   Eosinophils Relative 2 %   Eosinophils Absolute 0.1 0.0 - 0.5 K/uL   Basophils Relative 0 %   Basophils Absolute 0.0 0.0 - 0.1 K/uL   Immature Granulocytes 0 %   Abs Immature Granulocytes 0.03 0.00 - 0.07 K/uL    Comment: Performed at [facility], [address], [place], [place] [address]  Protime-INR     Status: None   Collection Time: 01/17/19  8:47 PM  Result Value Ref Range   Prothrombin Time 15.1 11.4 - 15.2 seconds   INR 1.2 0.8 - 1.2    Comment: (NOTE) INR goal varies based on device and disease states. Performed at [facility], [address] [place], [place] [address]   Lipase, blood     Status: Abnormal   Collection Time: 01/17/19  8:57 PM  Result Value Ref Range   Lipase 59 (H) 11 - 51 U/L    Comment: Performed at [facility], [address] [place], [place] [address]  Influenza panel by PCR (type A & B)     Status: None   Collection Time: 01/17/19  9:32 PM  Result Value Ref Range   Influenza A By PCR NEGATIVE NEGATIVE   Influenza B By PCR NEGATIVE NEGATIVE    Comment: (NOTE) The Xpert Xpress Flu assay is intended as an aid in the diagnosis of  influenza and should not be used as a sole basis for treatment.  This  assay is FDA approved for nasopharyngeal swab specimens only. Nasal  washings and aspirates are unacceptable for Xpert Xpress Flu testing. Performed at [facility], [address], [place], [place] [address]     Dg Chest 2 View  Result Date: 01/17/2019 CLINICAL DATA:  Sepsis, fever EXAM: CHEST - 2  VIEW COMPARISON:  07/31/2016 FINDINGS: There is hyperinflation of the lungs compatible with COPD. Biapical scarring. Heart is normal size. No confluent opacities or effusions. Mild  peribronchial thickening. IMPRESSION: Emphysema. Mild chronic bronchitic changes. Biapical scarring. No active disease. Electronically Signed   By: Charlett NoseKevin  Dover M.D.   On: 01/17/2019 21:29   Ct Abdomen Pelvis W Contrast  Result Date: 01/17/2019 CLINICAL DATA:  Right upper quadrant abdominal pain with nausea and diarrhea for 3 days. EXAM: CT ABDOMEN AND PELVIS WITH CONTRAST TECHNIQUE: Multidetector CT imaging of the abdomen and pelvis was performed using the standard protocol following bolus administration of intravenous contrast. CONTRAST:  80mL OMNIPAQUE IOHEXOL 300 MG/ML  SOLN COMPARISON:  None. FINDINGS: Lower chest: No acute findings. Several lung nodules, largest left upper lobe lingula abutting the left heart border measuring 6 mm. There are additional linear and reticular opacities that are likely due to scarring and/or subsegmental atelectasis. Hepatobiliary: Gallbladder is distended. Wall thickening measures up to 8 mm. 1 cm dependent stone or possible polyp lies in the upper gallbladder segment. There is inflammation along the gastrohepatic ligament. No common bile duct dilation. Liver shows mild intrahepatic bile duct dilation. No liver masses or focal lesions. Pancreas: Unremarkable. No pancreatic ductal dilatation or surrounding inflammatory changes. Spleen: Normal in size without focal abnormality. Adrenals/Urinary Tract: No adrenal masses. Mild bilateral renal cortical thinning. Left upper pole cyst, 1.1 cm. No other renal masses. No hydronephrosis. Ureters are normal in course and in caliber. Bladder is unremarkable. Stomach/Bowel: Small hiatal hernia. Stomach otherwise unremarkable. Second portion of the duodenum is compressed by the distended gallbladder. Small bowel and colon are normal in caliber. No wall thickening or inflammation. There are scattered left colon diverticula. No diverticulitis. No evidence of appendicitis. Vascular/Lymphatic: Aortic atherosclerosis. No enlarged abdominal or  pelvic lymph nodes. Reproductive: Uterus and bilateral adnexa are unremarkable. Other: No abdominal wall hernia.  No ascites. Musculoskeletal: Mild to moderate chronic compression fracture of L2 no acute fracture. No osteoblastic or osteolytic lesions. IMPRESSION: 1. Acute cholecystitis. 2. No other acute abnormality within the abdomen or pelvis. 3. Small lung base nodules. No follow-up needed if patient is low-risk (and has no known or suspected primary neoplasm). Non-contrast chest CT can be considered in 12 months if patient is high-risk. This recommendation follows the consensus statement: Guidelines for Management of Incidental Pulmonary Nodules Detected on CT Images: From the Fleischner Society 2017; Radiology 2017; 284:228-243. Electronically Signed   By: Amie Portlandavid  Ormond M.D.   On: 01/17/2019 23:03    Review of Systems  Constitutional: Positive for fever.  HENT: Positive for hearing loss.   Eyes: Negative.   Respiratory: Negative for cough and shortness of breath.   Cardiovascular: Negative for chest pain.  Gastrointestinal: Positive for abdominal pain and diarrhea. Negative for nausea and vomiting.  Genitourinary: Negative.   Musculoskeletal: Negative.   Skin: Negative.   Neurological: Negative.   Endo/Heme/Allergies: Negative.   Psychiatric/Behavioral: Negative.    Blood pressure 126/76, pulse 93, temperature (!) 102.9 F (39.4 C), temperature source Oral, resp. rate 16, height 5\' 4"  (1.626 m), weight 51 kg, SpO2 99 %. Physical Exam  Constitutional: She is oriented to person, place, and time. She appears well-developed and well-nourished.  HENT:  Head: Normocephalic.  Right Ear: External ear normal.  Left Ear: External ear normal.  Mouth/Throat: Oropharynx is clear and moist.  Eyes: Pupils are equal, round, and reactive to light. EOM are normal.  Faint icterus  Neck: No tracheal deviation present. No thyromegaly present.  Cardiovascular: Normal rate, regular rhythm and normal  heart sounds.  Respiratory: Breath sounds normal. No respiratory distress. She has no wheezes. She has no rales.  GI: Soft. She exhibits no distension. There is abdominal tenderness. There is no rebound and no guarding.  Very mild right upper quadrant tenderness, no generalized tenderness, no peritonitis  Musculoskeletal: Normal range of motion.  Neurological: She is alert and oriented to person, place, and time.  Skin: Skin is warm.  Psychiatric: She has a normal mood and affect.    Assessment/Plan: Acute cholecystitis and elevated liver function tests - agree with medical admission, IV antibiotics, NPO.  Repeat liver function tests in the morning.  If they are worse, she may need GI evaluation.  If they improve we will consider laparoscopic cholecystectomy if medically suitable.  I spoke at length with her and her daughter as well as with Dr. Julian Reil.  Liz Malady 01/18/2019, 12:23 AM

## 2019-01-18 NOTE — Anesthesia Procedure Notes (Signed)
Procedure Name: Intubation Date/Time: 01/18/2019 10:16 AM Performed by: Laruth Bouchard., CRNA Pre-anesthesia Checklist: Patient identified, Emergency Drugs available, Suction available and Patient being monitored Patient Re-evaluated:Patient Re-evaluated prior to induction Oxygen Delivery Method: Circle system utilized Preoxygenation: Pre-oxygenation with 100% oxygen Induction Type: IV induction and Rapid sequence Ventilation: Mask ventilation without difficulty Laryngoscope Size: 2 and Miller Grade View: Grade I Tube type: Oral Tube size: 7.0 mm Number of attempts: 1 Airway Equipment and Method: Stylet and Oral airway Placement Confirmation: ETT inserted through vocal cords under direct vision,  positive ETCO2 and breath sounds checked- equal and bilateral Secured at: 21 cm Tube secured with: Tape Dental Injury: Teeth and Oropharynx as per pre-operative assessment

## 2019-01-18 NOTE — Op Note (Signed)
Operative Note  Shelby Bradley 85 y.o. female [id number]  01/18/2019  Surgeon: Berna Bue MD  Assistant: Leary Roca PA-C  Procedure performed: Laparoscopic Cholecystectomy with cholangiogram  Preop diagnosis: cholecystitis, possible choledocholithiasis Post-op diagnosis/intraop findings: same, scarred appearance of the anterior surface of the liver and gallbladder peritoneum  Specimens: gallbladder  EBL: minimal  Complications: none  Description of procedure: After obtaining informed consent the patient was brought to the operating room. Patient is on antibiotics. SCD's were applied. General endotracheal anesthesia was initiated and a formal time-out was performed. The abdomen was prepped and draped in the usual sterile fashion and the abdomen was entered using an infraumbilical veress needle after instilling the site with local. Insufflation to was obtained, 3mm trocar and camera inserted and gross inspection revealed no evidence of injury from our entry or other intraabdominal abnormalities. Two 41mm trocars were introduced in the right midclavicular and right anterior axillary lines under direct visualization and following infiltration with local. An 30mm trocar was placed in the epigastrium. The liver is somewhat contracted with slightly greater discoloration and the anterior surface of the liver as well as the peritoneum overlying the posterior distal gallbladder has a scarred appearance although the cell feel soft, no palpable tumor. The gallbladder was retracted cephalad and the infundibulum was retracted laterally. It was very dilated and about 15cm in length. A combination of hook electrocautery and blunt dissection was utilized to clear the peritoneum from the neck and cystic duct, circumferentially isolating the cystic artery and cystic duct and lifting the gallbladder from the cystic plate. The critical view of safety was achieved with the cystic artery, cystic duct,  and liver bed visualized between them with no other structures. The artery was clipped with a single clip proximally and distally and divided. A clip was placed on the distal cystic duct and a ductotomy made sharply. The cholangiogram catheter was inserted and secured with a clip. Cholangiogram was performed demonstrating appropriate ductal anatomy, some reflux of contrast into the pancreatic duct, however I did not visualize the juncture of the common bile duct with the duodenum nor definitive entry of contrast into the duodenum. The cholangiogram catheter was removed and the proximal cystic duct ligated with 3 clips. The duct was then divided sharply. The gallbladder was dissected from the liver plate using electrocautery. Once freed the gallbladder was placed in an endocatch bag and removed intact through the epigastric trocar site. A small amount of bleeding on the liver bed was controlled with cautery. The right upper quadrant was irrigated and aspirated, the effluent was clear. Hemostasis was once again confirmed, and reinspection of the abdomen revealed no injuries. The clips were well opposed without any bile leak from the duct or the liver bed. The 37mm trocar site in the epigastrium was closed with 3 0 vicryls in the fascia under direct visualization using a PMI device. The abdomen was desufflated and all trocars removed. The skin incisions were closed with running subcuticular monocryl and Dermabond. The patient was awakened, extubated and transported to the recovery room in stable condition.   All counts were correct at the completion of the case.

## 2019-01-19 ENCOUNTER — Other Ambulatory Visit: Payer: Self-pay

## 2019-01-19 ENCOUNTER — Encounter (HOSPITAL_COMMUNITY): Payer: Self-pay | Admitting: General Practice

## 2019-01-19 LAB — CBC
HCT: 24.2 % — ABNORMAL LOW (ref 36.0–46.0)
Hemoglobin: 8.2 g/dL — ABNORMAL LOW (ref 12.0–15.0)
MCH: 29.7 pg (ref 26.0–34.0)
MCHC: 33.9 g/dL (ref 30.0–36.0)
MCV: 87.7 fL (ref 80.0–100.0)
Platelets: 124 10*3/uL — ABNORMAL LOW (ref 150–400)
RBC: 2.76 MIL/uL — ABNORMAL LOW (ref 3.87–5.11)
RDW: 14 % (ref 11.5–15.5)
WBC: 9 10*3/uL (ref 4.0–10.5)
nRBC: 0 % (ref 0.0–0.2)

## 2019-01-19 LAB — COMPREHENSIVE METABOLIC PANEL
ALBUMIN: 2.2 g/dL — AB (ref 3.5–5.0)
ALT: 81 U/L — ABNORMAL HIGH (ref 0–44)
AST: 49 U/L — ABNORMAL HIGH (ref 15–41)
Alkaline Phosphatase: 122 U/L (ref 38–126)
Anion gap: 5 (ref 5–15)
BILIRUBIN TOTAL: 1.3 mg/dL — AB (ref 0.3–1.2)
BUN: 16 mg/dL (ref 8–23)
CO2: 21 mmol/L — ABNORMAL LOW (ref 22–32)
Calcium: 7.9 mg/dL — ABNORMAL LOW (ref 8.9–10.3)
Chloride: 108 mmol/L (ref 98–111)
Creatinine, Ser: 0.86 mg/dL (ref 0.44–1.00)
GFR calc Af Amer: >60 mL/min (ref >60)
GFR calc non Af Amer: >60 mL/min (ref >60)
GLUCOSE: 161 mg/dL — AB (ref 70–99)
Potassium: 3.2 mmol/L — ABNORMAL LOW (ref 3.5–5.1)
Sodium: 134 mmol/L — ABNORMAL LOW (ref 135–145)
TOTAL PROTEIN: 4.4 g/dL — AB (ref 6.5–8.1)

## 2019-01-19 LAB — AMMONIA: Ammonia: 29 umol/L (ref 9–35)

## 2019-01-19 MED ORDER — POTASSIUM CHLORIDE CRYS ER 20 MEQ PO TBCR
40.0000 meq | EXTENDED_RELEASE_TABLET | ORAL | Status: AC
  Administered 2019-01-19: 40 meq via ORAL
  Filled 2019-01-19: qty 2

## 2019-01-19 MED ORDER — ENOXAPARIN SODIUM 40 MG/0.4ML ~~LOC~~ SOLN
40.0000 mg | SUBCUTANEOUS | Status: DC
  Administered 2019-01-19: 40 mg via SUBCUTANEOUS
  Filled 2019-01-19: qty 0.4

## 2019-01-19 MED ORDER — WHITE PETROLATUM EX OINT
TOPICAL_OINTMENT | CUTANEOUS | Status: AC
  Administered 2019-01-19
  Filled 2019-01-19: qty 28.35

## 2019-01-19 NOTE — Progress Notes (Signed)
Patient ID: Shelby Bradley, female   DOB: 10/02/1928, 85 y.o.   MRN: [id number]  PROGRESS NOTE    Shelby Bradley  HQI:[id number] DOB: [birth year] DOA: 01/17/2019 PCP: Lucky Cowboy, MD   Brief Narrative:  85 year old female with history of hypertension, hyperlipidemia, diabetes mellitus diet-controlled, reflux, hypothyroidism, macular degeneration presented on 01/18/2019 with abdominal pain, fever.  CT scan of the abdomen showed findings suggestive of cholecystitis.  She was started on IV antibiotics.  General surgery was consulted.  Assessment & Plan:   Principal Problem:   Acute cholecystitis Active Problems:   Hypertension   COPD (chronic obstructive pulmonary disease) (HCC)  Acute cholecystitis with acute transaminitis -Currently on cefepime and Flagyl.  Status post laparoscopic cholecystectomy on 01/18/2019.  Follow further recommendations by general surgery.  Advance diet as per general surgery.  Will DC antibiotics once okay with general surgery. -Currently afebrile. -Repeat a.m. labs including LFTs -Incentive spirometry  Hypertension -Continue Norvasc.  Blood pressure stable  Hypokalemia Replace.  Repeat a.m. labs  Hypothyroidism--continue Synthroid  Bipolar disorder -Continue Elavil; and Xanax as needed  DVT prophylaxis: Start Lovenox Code Status: Full Family Communication: Daughter at bedside Disposition Plan: Home in 1 to 2 days if remains stable and cleared by  general surgery  Consultants: General surgery  Procedures: Laparoscopic cholecystectomy on 01/18/2019  Antimicrobials:  Anti-infectives (From admission, onward)   Start     Dose/Rate Route Frequency Ordered Stop   01/18/19 1000  ceFEPIme (MAXIPIME) 1 g in sodium chloride 0.9 % 100 mL IVPB     1 g 200 mL/hr over 30 Minutes Intravenous Every 12 hours 01/17/19 2150     01/18/19 0600  metroNIDAZOLE (FLAGYL) IVPB 500 mg     500 mg 100 mL/hr over 60 Minutes Intravenous Every 8 hours 01/17/19 2356     01/17/19 2115  ceFEPIme (MAXIPIME) 2 g in sodium chloride 0.9 % 100 mL IVPB     2 g 200 mL/hr over 30 Minutes Intravenous  Once 01/17/19 2109 01/17/19 2341   01/17/19 2115  metroNIDAZOLE (FLAGYL) IVPB 500 mg     500 mg 100 mL/hr over 60 Minutes Intravenous  Once 01/17/19 2109 01/17/19 2342        Subjective: Patient seen and examined at bedside.  She denies worsening abdominal pain.  No overnight fever or vomiting.  Objective: Vitals:   01/19/19 0214 01/19/19 0534 01/19/19 1019 01/19/19 1250  BP: 119/66 124/65 140/62 139/68  Pulse: 75 76 78 79  Resp: Temp: 97.6 F (36.4 C) 97.6 F (36.4 C) 98 F (36.7 C) 98 F (36.7 C)  TempSrc: Oral Oral Oral Oral  SpO2: 98% 99% 100% 99%  Weight:      Height:        Intake/Output Summary (Last 24 hours) at 01/19/2019 1348 Last data filed at 01/19/2019 1134 Gross per 24 hour  Intake 1810.3 ml  Output 862 ml  Net 948.3 ml   Filed Weights   01/17/19 2128  Weight: 51 kg    Examination:  General exam: Appears calm and comfortable. Respiratory system: Bilateral decreased breath sounds at bases Cardiovascular system: S1 & S2 heard, Rate controlled Gastrointestinal system: Abdomen is nondistended, soft and nontender. Normal bowel sounds heard. Extremities: No cyanosis, clubbing, edema      Data Reviewed: I have personally reviewed following labs and imaging studies  CBC: Recent Labs  Lab 01/17/19 2047 01/19/19 0204  WBC 7.3 9.0  NEUTROABS 6.8  --   HGB 9.7*  8.2*  HCT 28.8* 24.2*  MCV 87.8 87.7  PLT 138* 124*   Basic Metabolic Panel: Recent Labs  Lab 01/17/19 2047 01/18/19 0154 01/19/19 0204  NA 133* 131* 134*  K 3.5 3.1* 3.2*  CL 104 103 108  CO2 19* 19* 21*  GLUCOSE 134* 127* 161*  BUN CREATININE 1.07* 1.02* 0.86  CALCIUM 9.0 8.1* 7.9*   GFR: Estimated Creatinine Clearance: 36.4 mL/min (by C-G formula based on SCr of 0.86 mg/dL). Liver Function Tests: Recent Labs  Lab 01/17/19 2047  01/18/19 0154 01/19/19 0204  AST 124* 89* 49*  ALT 149* 113* 81*  ALKPHOS 160* 143* 122  BILITOT 3.1* 3.1* 1.3*  PROT 6.4* 5.1* 4.4*  ALBUMIN 3.4* 2.7* 2.2*   Recent Labs  Lab 01/17/19 2057  LIPASE 59*   No results for input(s): AMMONIA in the last 168 hours. Coagulation Profile: Recent Labs  Lab 01/17/19 2047  INR 1.2   Cardiac Enzymes: No results for input(s): CKTOTAL, CKMB, CKMBINDEX, TROPONINI in the last 168 hours. BNP (last 3 results) No results for input(s): PROBNP in the last 8760 hours. HbA1C: No results for input(s): HGBA1C in the last 72 hours. CBG: No results for input(s): GLUCAP in the last 168 hours. Lipid Profile: No results for input(s): CHOL, HDL, LDLCALC, TRIG, CHOLHDL, LDLDIRECT in the last 72 hours. Thyroid Function Tests: No results for input(s): TSH, T4TOTAL, FREET4, T3FREE, THYROIDAB in the last 72 hours. Anemia Panel: No results for input(s): VITAMINB12, FOLATE, FERRITIN, TIBC, IRON, RETICCTPCT in the last 72 hours. Sepsis Labs: Recent Labs  Lab 01/17/19 2047 01/18/19 0154  LATICACIDVEN 1.2 0.9    Recent Results (from the past 240 hour(s))  Culture, blood (Routine x 2)     Status: None (Preliminary result)   Collection Time: 01/17/19  8:30 PM  Result Value Ref Range Status   Specimen Description BLOOD RIGHT ARM  Final   Special Requests   Final    BOTTLES DRAWN AEROBIC AND ANAEROBIC Blood Culture results may not be optimal due to an excessive volume of blood received in culture bottles   Culture   Final    NO GROWTH 2 DAYS Performed at [facility], [address] [place], [place] [address]    Report Status PENDING  Incomplete  Culture, blood (Routine x 2)     Status: None (Preliminary result)   Collection Time: 01/17/19  8:45 PM  Result Value Ref Range Status   Specimen Description BLOOD RIGHT HAND  Final   Special Requests   Final    BOTTLES DRAWN AEROBIC ONLY Blood Culture results may not be optimal due to an excessive  volume of blood received in culture bottles   Culture   Final    NO GROWTH 2 DAYS Performed at [facility], [address], [place], [place] [address]    Report Status PENDING  Incomplete  Urine culture     Status: Abnormal   Collection Time: 01/17/19  9:08 PM  Result Value Ref Range Status   Specimen Description URINE, CLEAN CATCH  Final   Special Requests   Final    NONE Performed at [facility], [address], [place], [place] [address]    Culture MULTIPLE SPECIES PRESENT, SUGGEST RECOLLECTION (A)  Final   Report Status 01/18/2019 FINAL  Final  MRSA PCR Screening     Status: None   Collection Time: 01/18/19  8:39 AM  Result Value Ref Range Status  MRSA by PCR NEGATIVE NEGATIVE Final    Comment:        The GeneXpert MRSA Assay (FDA approved for NASAL specimens only), is one component of a comprehensive MRSA colonization surveillance program. It is not intended to diagnose MRSA infection nor to guide or monitor treatment for MRSA infections. Performed at [facility], [address] [place], [place] [address]          Radiology Studies: Dg Chest 2 View  Result Date: 01/17/2019 CLINICAL DATA:  Sepsis, fever EXAM: CHEST - 2 VIEW COMPARISON:  07/31/2016 FINDINGS: There is hyperinflation of the lungs compatible with COPD. Biapical scarring. Heart is normal size. No confluent opacities or effusions. Mild peribronchial thickening. IMPRESSION: Emphysema. Mild chronic bronchitic changes. Biapical scarring. No active disease. Electronically Signed   By: Charlett Nose M.D.   On: 01/17/2019 21:29   Dg Cholangiogram Operative  Result Date: 01/18/2019 CLINICAL DATA:  Intraoperative cholangiogram during laparoscopic cholecystectomy. EXAM: INTRAOPERATIVE CHOLANGIOGRAM FLUOROSCOPY TIME:  26 seconds COMPARISON:  CT abdomen pelvis-01/17/2019 FINDINGS: Intraoperative cholangiographic images of the right upper abdominal quadrant during laparoscopic cholecystectomy are  provided for review. Surgical clips overlie the expected location of the gallbladder fossa. Contrast injection demonstrates selective cannulation of the central aspect of the cystic duct. There is passage of contrast through the central aspect of the cystic duct with filling of a non dilated common bile duct. There is minimal reflux of injected contrast into the common hepatic duct and central aspect of the non dilated intrahepatic biliary system. There is minimal opacification the central aspect the pancreatic duct appears nondilated. There are no discrete filling defects within the opacified portions of the biliary system to suggest the presence of choledocholithiasis, however there is non visualization of the distal aspect of the CBD without definitive opacification of the duodenum. IMPRESSION: No definite evidence of choledocholithiasis, however there is nonvisualization of the distal aspect of the CBD without definitive opacification of the duodenum. Correlation with the operative report is advised. Electronically Signed   By: Simonne Come M.D.   On: 01/18/2019 11:40   Ct Abdomen Pelvis W Contrast  Result Date: 01/17/2019 CLINICAL DATA:  Right upper quadrant abdominal pain with nausea and diarrhea for 3 days. EXAM: CT ABDOMEN AND PELVIS WITH CONTRAST TECHNIQUE: Multidetector CT imaging of the abdomen and pelvis was performed using the standard protocol following bolus administration of intravenous contrast. CONTRAST:  55mL OMNIPAQUE IOHEXOL 300 MG/ML  SOLN COMPARISON:  None. FINDINGS: Lower chest: No acute findings. Several lung nodules, largest left upper lobe lingula abutting the left heart border measuring 6 mm. There are additional linear and reticular opacities that are likely due to scarring and/or subsegmental atelectasis. Hepatobiliary: Gallbladder is distended. Wall thickening measures up to 8 mm. 1 cm dependent stone or possible polyp lies in the upper gallbladder segment. There is inflammation  along the gastrohepatic ligament. No common bile duct dilation. Liver shows mild intrahepatic bile duct dilation. No liver masses or focal lesions. Pancreas: Unremarkable. No pancreatic ductal dilatation or surrounding inflammatory changes. Spleen: Normal in size without focal abnormality. Adrenals/Urinary Tract: No adrenal masses. Mild bilateral renal cortical thinning. Left upper pole cyst, 1.1 cm. No other renal masses. No hydronephrosis. Ureters are normal in course and in caliber. Bladder is unremarkable. Stomach/Bowel: Small hiatal hernia. Stomach otherwise unremarkable. Second portion of the duodenum is compressed by the distended gallbladder. Small bowel and colon are normal in caliber. No wall thickening or inflammation. There are scattered left colon diverticula. No diverticulitis.  No evidence of appendicitis. Vascular/Lymphatic: Aortic atherosclerosis. No enlarged abdominal or pelvic lymph nodes. Reproductive: Uterus and bilateral adnexa are unremarkable. Other: No abdominal wall hernia.  No ascites. Musculoskeletal: Mild to moderate chronic compression fracture of L2 no acute fracture. No osteoblastic or osteolytic lesions. IMPRESSION: 1. Acute cholecystitis. 2. No other acute abnormality within the abdomen or pelvis. 3. Small lung base nodules. No follow-up needed if patient is low-risk (and has no known or suspected primary neoplasm). Non-contrast chest CT can be considered in 12 months if patient is high-risk. This recommendation follows the consensus statement: Guidelines for Management of Incidental Pulmonary Nodules Detected on CT Images: From the Fleischner Society 2017; Radiology 2017; 284:228-243. Electronically Signed   By: Amie Portland M.D.   On: 01/17/2019 23:03        Scheduled Meds: . amitriptyline  25 mg Oral QHS  . amLODipine  2.5 mg Oral QHS  . levothyroxine  125 mcg Oral Q0600  . pantoprazole  80 mg Oral Daily   Continuous Infusions: . sodium chloride Stopped (01/18/19  0853)  . ceFEPime (MAXIPIME) IV 1 g (01/19/19 1038)  . lactated ringers 10 mL/hr at 01/18/19 0924  . metronidazole 500 mg (01/19/19 0531)     LOS: 1 day        Glade Lloyd, MD [facility] 01/19/2019, 1:48 PM

## 2019-01-19 NOTE — Progress Notes (Signed)
Patient in tears this AM. States she gets anxious and cries and at home takes a benzodiazepam. Paged Dr on call to see if xanax could be given this AM opposed to ordered bedtime med.

## 2019-01-19 NOTE — Progress Notes (Addendum)
1 Day Post-Op  Subjective: CC: No complaints.  Patient denies any abdominal pain, N/V. Tolerating CLD, was not advance yesterday. No flatus yet. Mobilized with walker in halls yesterday. Reports she hasn't voided and needed an I/O yesterday. Daughter at bedside, all questions answered. Patient lives at home by herself and normally walks without cane/walker  Objective: Vital signs in last 24 hours: Temp:  [97.5 F (36.4 C)-98.4 F (36.9 C)] 97.6 F (36.4 C) (03/18 0534) Pulse Rate:  [70-106] 76 (03/18 0534) Resp:  [11-26] 17 (03/18 0534) BP: (119-139)/(53-94) 124/65 (03/18 0534) SpO2:  [92 %-100 %] 99 % (03/18 0534) Last BM Date: 01/17/19  Intake/Output from previous day: 03/17 0701 - 03/18 0700 In: 1748.3 [I.V.:1548.3; IV Piggyback:200] Out: 1542 [Urine:1512; Blood:30] Intake/Output this shift: No intake/output data recorded.  PE: Gen: Awake and alert, NAD.  Heart: RRR Lungs; CTA b/l, normal effort Abd: Soft, NT, ND, +BS, incisions c/d/i Msk: SCD's in place  Lab Results:  Recent Labs    01/17/19 2047 01/19/19 0204  WBC 7.3 9.0  HGB 9.7* 8.2*  HCT 28.8* 24.2*  PLT 138* 124*   BMET Recent Labs    01/18/19 0154 01/19/19 0204  NA 131* 134*  K 3.1* 3.2*  CL 103 108  CO2 19* 21*  GLUCOSE 127* 161*  BUN 17 16  CREATININE 1.02* 0.86  CALCIUM 8.1* 7.9*   PT/INR Recent Labs    01/17/19 2047  LABPROT 15.1  INR 1.2   CMP     Component Value Date/Time   NA 134 (L) 01/19/2019 0204   K 3.2 (L) 01/19/2019 0204   CL 108 01/19/2019 0204   CO2 21 (L) 01/19/2019 0204   GLUCOSE 161 (H) 01/19/2019 0204   BUN 16 01/19/2019 0204   CREATININE 0.86 01/19/2019 0204   CREATININE 0.92 (H) 01/05/2019 1455   CALCIUM 7.9 (L) 01/19/2019 0204   PROT 4.4 (L) 01/19/2019 0204   ALBUMIN 2.2 (L) 01/19/2019 0204   AST 49 (H) 01/19/2019 0204   ALT 81 (H) 01/19/2019 0204   ALKPHOS 122 01/19/2019 0204   BILITOT 1.3 (H) 01/19/2019 0204   GFRNONAA >60 01/19/2019 0204   GFRNONAA 56 (L) 01/05/2019 1455   GFRAA >60 01/19/2019 0204   GFRAA 64 01/05/2019 1455   Lipase     Component Value Date/Time   LIPASE 59 (H) 01/17/2019 2057       Studies/Results: Dg Chest 2 View  Result Date: 01/17/2019 CLINICAL DATA:  Sepsis, fever EXAM: CHEST - 2 VIEW COMPARISON:  07/31/2016 FINDINGS: There is hyperinflation of the lungs compatible with COPD. Biapical scarring. Heart is normal size. No confluent opacities or effusions. Mild peribronchial thickening. IMPRESSION: Emphysema. Mild chronic bronchitic changes. Biapical scarring. No active disease. Electronically Signed   By: Charlett Nose M.D.   On: 01/17/2019 21:29   Dg Cholangiogram Operative  Result Date: 01/18/2019 CLINICAL DATA:  Intraoperative cholangiogram during laparoscopic cholecystectomy. EXAM: INTRAOPERATIVE CHOLANGIOGRAM FLUOROSCOPY TIME:  26 seconds COMPARISON:  CT abdomen pelvis-01/17/2019 FINDINGS: Intraoperative cholangiographic images of the right upper abdominal quadrant during laparoscopic cholecystectomy are provided for review. Surgical clips overlie the expected location of the gallbladder fossa. Contrast injection demonstrates selective cannulation of the central aspect of the cystic duct. There is passage of contrast through the central aspect of the cystic duct with filling of a non dilated common bile duct. There is minimal reflux of injected contrast into the common hepatic duct and central aspect of the non dilated intrahepatic biliary system. There is minimal  opacification the central aspect the pancreatic duct appears nondilated. There are no discrete filling defects within the opacified portions of the biliary system to suggest the presence of choledocholithiasis, however there is non visualization of the distal aspect of the CBD without definitive opacification of the duodenum. IMPRESSION: No definite evidence of choledocholithiasis, however there is nonvisualization of the distal aspect of the CBD  without definitive opacification of the duodenum. Correlation with the operative report is advised. Electronically Signed   By: Simonne Come M.D.   On: 01/18/2019 11:40   Ct Abdomen Pelvis W Contrast  Result Date: 01/17/2019 CLINICAL DATA:  Right upper quadrant abdominal pain with nausea and diarrhea for 3 days. EXAM: CT ABDOMEN AND PELVIS WITH CONTRAST TECHNIQUE: Multidetector CT imaging of the abdomen and pelvis was performed using the standard protocol following bolus administration of intravenous contrast. CONTRAST:  27mL OMNIPAQUE IOHEXOL 300 MG/ML  SOLN COMPARISON:  None. FINDINGS: Lower chest: No acute findings. Several lung nodules, largest left upper lobe lingula abutting the left heart border measuring 6 mm. There are additional linear and reticular opacities that are likely due to scarring and/or subsegmental atelectasis. Hepatobiliary: Gallbladder is distended. Wall thickening measures up to 8 mm. 1 cm dependent stone or possible polyp lies in the upper gallbladder segment. There is inflammation along the gastrohepatic ligament. No common bile duct dilation. Liver shows mild intrahepatic bile duct dilation. No liver masses or focal lesions. Pancreas: Unremarkable. No pancreatic ductal dilatation or surrounding inflammatory changes. Spleen: Normal in size without focal abnormality. Adrenals/Urinary Tract: No adrenal masses. Mild bilateral renal cortical thinning. Left upper pole cyst, 1.1 cm. No other renal masses. No hydronephrosis. Ureters are normal in course and in caliber. Bladder is unremarkable. Stomach/Bowel: Small hiatal hernia. Stomach otherwise unremarkable. Second portion of the duodenum is compressed by the distended gallbladder. Small bowel and colon are normal in caliber. No wall thickening or inflammation. There are scattered left colon diverticula. No diverticulitis. No evidence of appendicitis. Vascular/Lymphatic: Aortic atherosclerosis. No enlarged abdominal or pelvic lymph nodes.  Reproductive: Uterus and bilateral adnexa are unremarkable. Other: No abdominal wall hernia.  No ascites. Musculoskeletal: Mild to moderate chronic compression fracture of L2 no acute fracture. No osteoblastic or osteolytic lesions. IMPRESSION: 1. Acute cholecystitis. 2. No other acute abnormality within the abdomen or pelvis. 3. Small lung base nodules. No follow-up needed if patient is low-risk (and has no known or suspected primary neoplasm). Non-contrast chest CT can be considered in 12 months if patient is high-risk. This recommendation follows the consensus statement: Guidelines for Management of Incidental Pulmonary Nodules Detected on CT Images: From the Fleischner Society 2017; Radiology 2017; 284:228-243. Electronically Signed   By: Amie Portland M.D.   On: 01/17/2019 23:03    Anti-infectives: Anti-infectives (From admission, onward)   Start     Dose/Rate Route Frequency Ordered Stop   01/18/19 1000  ceFEPIme (MAXIPIME) 1 g in sodium chloride 0.9 % 100 mL IVPB     1 g 200 mL/hr over 30 Minutes Intravenous Every 12 hours 01/17/19 2150     01/18/19 0600  metroNIDAZOLE (FLAGYL) IVPB 500 mg     500 mg 100 mL/hr over 60 Minutes Intravenous Every 8 hours 01/17/19 2356     01/17/19 2115  ceFEPIme (MAXIPIME) 2 g in sodium chloride 0.9 % 100 mL IVPB     2 g 200 mL/hr over 30 Minutes Intravenous  Once 01/17/19 2109 01/17/19 2341   01/17/19 2115  metroNIDAZOLE (FLAGYL) IVPB 500 mg  500 mg 100 mL/hr over 60 Minutes Intravenous  Once 01/17/19 2109 01/17/19 2342       Assessment/Plan Acute Cholecystitis  - s/p Laparoscopic Cholecystectomy with IOC, Dr. Fredricka Bonine, 3/17 - IOC without definite choledocholithiasis, T bili and LFT's downtrending. No indication for MRCP - Mobilize (PT eval) and IS - Advance diet   FEN - Regular VTE - SCDs, okay for chemical prophylaxis from surgical standpoint ID - WBC wnl; Cefepime, Flagyl 3/16 >>> Foley - None, bladder scan (reports has not urinated since  surgery and required 2 I&O's) Follow Up - 02/08/2019 @ 830am  Plan: Check bladder scan. Pt Eval (patient lives at home by herself). Advance diet. Pain is currently controlled with oral tylenol. Possible recheck later today. All questions were answered.    LOS: 1 day    Jacinto Halim , [facility] 01/19/2019, 8:42 AM Pager: [phone]

## 2019-01-19 NOTE — TOC Initial Note (Signed)
Transition of Care [facility]) - Initial/Assessment Note    Patient Details  Name: Shelby Bradley MRN: [id number] Date of Birth: 03/04/1928  Transition of Care [facility]) CM/SW Contact:    Kingsley Plan, RN Phone Number: 01/19/2019, 2:53 PM  Clinical Narrative:                  Patient resting in bed. Daughter Shelby Bradley visiting. Confirmed face sheet information. Patient lives alone, Shelby Bradley lives 1/4 mile away , but is able to stay with her mother at discharge until her mother gets stronger.   Shelby Bradley will check to see if they have a walker at home. Provided Medicare.gov list of home health agencies. Patient and Shelby Bradley will discuss this evening and NCM will follow up in am for choice.  Will need home health order for HHPT and face to face and order for walker. Expected Discharge Plan: Home w Home Health Services Barriers to Discharge: No Barriers Identified   Patient Goals and CMS Choice Patient states their goals for this hospitalization and ongoing recovery are:: To go home  CMS Medicare.gov Compare Post Acute Care list provided to:: Patient(Daughter Shelby Bradley) Choice offered to / list presented to : Patient, Adult Children  Expected Discharge Plan and Services Expected Discharge Plan: Home w Home Health Services Discharge Planning Services: CM Consult   Living arrangements for the past 2 months: Single Family Home                 DME Arranged: Walker rolling   HH Arranged: PT    Prior Living Arrangements/Services Living arrangements for the past 2 months: Single Family Home   Patient language and need for interpreter reviewed:: No Do you feel safe going back to the place where you live?: Yes      Need for Family Participation in Patient Care: Yes (Comment) Care giver support system in place?: Yes (comment)   Criminal Activity/Legal Involvement Pertinent to Current Situation/Hospitalization: No - Comment as needed  Activities of Daily Living Home Assistive Devices/Equipment:  Cane (specify quad or straight), Eyeglasses ADL Screening (condition at time of admission) Patient's cognitive ability adequate to safely complete daily activities?: Yes Is the patient deaf or have difficulty hearing?: No Does the patient have difficulty seeing, even when wearing glasses/contacts?: No Does the patient have difficulty concentrating, remembering, or making decisions?: No Patient able to express need for assistance with ADLs?: Yes Does the patient have difficulty dressing or bathing?: No Independently performs ADLs?: Yes (appropriate for developmental age) Does the patient have difficulty walking or climbing stairs?: No Weakness of Legs: None Weakness of Arms/Hands: None  Permission Sought/Granted   Permission granted to share information with : Yes, Verbal Permission Granted  Share Information with NAME: Shelby Bradley     Permission granted to share info w Relationship: daughter      Emotional Assessment Appearance:: Appears stated age     Orientation: : Oriented to Self, Oriented to Place, Oriented to  Time, Oriented to Situation      Admission diagnosis:  Acute cholecystitis [K81.0] Sepsis, due to unspecified organism, unspecified whether acute organ dysfunction present [facility]) [A41.9] Patient Active Problem List   Diagnosis Date Noted  . Acute cholecystitis 01/18/2019  . Malnutrition of mild degree (HCC) 11/18/2016  . COPD (chronic obstructive pulmonary disease) (HCC) 07/10/2016  . Encounter for Medicare annual wellness exam 10/02/2015  . Hypothyroidism 06/28/2015  . Macular degeneration 06/28/2015  . Lower back pain 06/28/2015  . Medication management 12/05/2014  . Hypertension   .  Hyperlipidemia, mixed   . B12 deficiency anemia   . Anemia   . Gastroesophageal reflux disease   . Vitamin D deficiency   . Abnormal glucose    PCP:  Lucky Cowboy, MD Pharmacy:   [facility] [address] [place] - [address] [place] [address] Phone: [phone] Fax: [phone]  [facility] [address] [place], [place] - [address] [place] [address] Phone: [phone] Fax: [phone]     Social Determinants of Health (SDOH) Interventions    Readmission Risk Interventions 30 Day Unplanned Readmission Risk Score     ED to Hosp-Admission (Current) from 01/17/2019 in [facility]  SURGICAL  30 Day Unplanned Readmission Risk Score (%)  12 Filed at 01/19/2019 1200     This score is the patient's risk of an unplanned readmission within 30 days of being discharged (0 -100%). The score is based on dignosis, age, lab data, medications, orders, and past utilization.   Low:  0-14.9   Medium: 15-21.9   High: 22-29.9   Extreme: 30 and above       No flowsheet data found.

## 2019-01-19 NOTE — Progress Notes (Signed)
Spke with Dr Sheliah Hatch who approved giving xanax now.

## 2019-01-19 NOTE — Progress Notes (Signed)
  Pt unable to void. Bladder scan performed at 0917 per MD order (504 ml).Seeing by PT, ambulating to BR without success.  Provided additional time and then requested BSC  to sit for at least 20 minutes. Repeat bladder scan @ 1218 (564 ml). Performed Straight cath with 420 ml yellow, clear urine. Pt verbalized relief upon intervention. Doralene Glanz Ruthe Mannan, BSN, RN

## 2019-01-19 NOTE — Evaluation (Signed)
Physical Therapy Evaluation Patient Details Name: Shelby Bradley MRN: [id number] DOB: 03/25/1928 Today's Date: 01/19/2019   History of Present Illness  Pt is an 85 yo female who with medical history significant of HTN, who presented to the ED with c/o fever and RUQ abd pain. Pt is now s/p Laparoscopic Cholecystectomy with cholangiogram.   Clinical Impression  Pt received up in chair, willing to participate in therapy. Assisted pt to bathroom. Ambulation and transfers with min guard. Bed mobility limited; educated pt on log roll technique to protect abdominal incision. Pt's daughter in room and says she is able to stay with pt to assist with mobility.  Recommending HHPT and 24 hour supervision, due to concerns about pt safely getting in and out of bed. Discussed recliner option for sleeping if needed at home. She will benefit from continued skilled acute PT in order to reinforce log roll for bed mobility and safely progress functional mobility for safe DC.    Follow Up Recommendations Home health PT;Supervision/Assistance - 24 hour    Equipment Recommendations  Rolling walker with 5" wheels    Recommendations for Other Services       Precautions / Restrictions Precautions Precautions: Fall Restrictions Weight Bearing Restrictions: No      Mobility  Bed Mobility Overal bed mobility: Needs Assistance Bed Mobility: Rolling;Sidelying to Sit;Sit to Supine Rolling: Min assist Sidelying to sit: Mod assist   Sit to supine: Min assist;HOB elevated   General bed mobility comments: MinA for LE management for sit to supine, pt unable to lie back on flat bed, required HOB elevated. Educated pt on log roll technique for bed mobility- required ModA for trunk elevation from flat bed.  Transfers Overall transfer level: Needs assistance Equipment used: Rolling walker (2 wheeled) Transfers: Sit to/from Stand Sit to Stand: Min guard;Min assist         General transfer comment: MinA for  initial sit to stand x2 from chair/toilet for boost. Progressed to min guard with increased time required  Ambulation/Gait Ambulation/Gait assistance: Min guard Gait Distance (Feet): 100 Feet Assistive device: Rolling walker (2 wheeled) Gait Pattern/deviations: Step-through pattern;Decreased stride length;Trunk flexed Gait velocity: decreased   General Gait Details: Ambulation in hall with min guard for safety, use of RW. Pt with slow steady gait, no LOB  Stairs            Wheelchair Mobility    Modified Rankin (Stroke Patients Only)       Balance Overall balance assessment: Needs assistance Sitting-balance support: No upper extremity supported;Feet supported Sitting balance-Leahy Scale: Good       Standing balance-Leahy Scale: Fair Standing balance comment: Pt able to stand and wash hands at sink with no UE support. Benefits from B UE support during ambulation for balance                             Pertinent Vitals/Pain Pain Assessment: No/denies pain    Home Living Family/patient expects to be discharged to:: Private residence Living Arrangements: Alone Available Help at Discharge: Family;Available 24 hours/day Type of Home: House Home Access: Ramped entrance     Home Layout: One level Home Equipment: Walker - 2 wheels;Tub bench;Grab bars - tub/shower      Prior Function Level of Independence: Independent         Comments: Pt lives alone and ambulates without AD     Hand Dominance        Extremity/Trunk Assessment  Upper Extremity Assessment Upper Extremity Assessment: Overall WFL for tasks assessed    Lower Extremity Assessment Lower Extremity Assessment: Generalized weakness    Cervical / Trunk Assessment Cervical / Trunk Assessment: Kyphotic  Communication   Communication: HOH  Cognition Arousal/Alertness: Awake/alert Behavior During Therapy: WFL for tasks assessed/performed Overall Cognitive Status: Within Functional  Limits for tasks assessed                                 General Comments: pt very independent-wants to prove she can do everything on her own      General Comments      Exercises     Assessment/Plan    PT Assessment Patient needs continued PT services  PT Problem List Decreased strength;Decreased balance;Decreased mobility;Decreased knowledge of use of DME;Decreased activity tolerance;Decreased coordination;Decreased safety awareness       PT Treatment Interventions DME instruction;Functional mobility training;Balance training;Patient/family education;Gait training;Therapeutic activities;Neuromuscular re-education;Therapeutic exercise;Cognitive remediation    PT Goals (Current goals can be found in the Care Plan section)  Acute Rehab PT Goals Patient Stated Goal: go home PT Goal Formulation: With patient/family Time For Goal Achievement: 02/02/19 Potential to Achieve Goals: Good    Frequency Min 3X/week   Barriers to discharge        Co-evaluation               AM-PAC PT "6 Clicks" Mobility  Outcome Measure Help needed turning from your back to your side while in a flat bed without using bedrails?: A Little Help needed moving from lying on your back to sitting on the side of a flat bed without using bedrails?: A Little Help needed moving to and from a bed to a chair (including a wheelchair)?: A Little Help needed standing up from a chair using your arms (e.g., wheelchair or bedside chair)?: A Little Help needed to walk in hospital room?: None Help needed climbing 3-5 steps with a railing? : A Lot 6 Click Score: 18    End of Session Equipment Utilized During Treatment: Gait belt Activity Tolerance: Patient tolerated treatment well Patient left: with family/visitor present;Other (comment)(on BSC, nurse tech notified) Nurse Communication: Mobility status PT Visit Diagnosis: Unsteadiness on feet (R26.81);Other abnormalities of gait and mobility  (R26.89)    Time:  -      Charges:              Halina Andreas, SPT   Halina Andreas 01/19/2019, 11:33 AM

## 2019-01-20 LAB — COMPREHENSIVE METABOLIC PANEL
ALT: 60 U/L — ABNORMAL HIGH (ref 0–44)
AST: 26 U/L (ref 15–41)
Albumin: 2.1 g/dL — ABNORMAL LOW (ref 3.5–5.0)
Alkaline Phosphatase: 101 U/L (ref 38–126)
Anion gap: 7 (ref 5–15)
BUN: 12 mg/dL (ref 8–23)
CALCIUM: 7.9 mg/dL — AB (ref 8.9–10.3)
CO2: 18 mmol/L — ABNORMAL LOW (ref 22–32)
Chloride: 111 mmol/L (ref 98–111)
Creatinine, Ser: 0.77 mg/dL (ref 0.44–1.00)
GFR calc Af Amer: >60 mL/min (ref >60)
GFR calc non Af Amer: >60 mL/min (ref >60)
Glucose, Bld: 122 mg/dL — ABNORMAL HIGH (ref 70–99)
Potassium: 3.4 mmol/L — ABNORMAL LOW (ref 3.5–5.1)
Sodium: 136 mmol/L (ref 135–145)
Total Bilirubin: 0.5 mg/dL (ref 0.3–1.2)
Total Protein: 4.2 g/dL — ABNORMAL LOW (ref 6.5–8.1)

## 2019-01-20 LAB — CBC WITH DIFFERENTIAL/PLATELET
Abs Immature Granulocytes: 0.03 10*3/uL (ref 0.00–0.07)
Basophils Absolute: 0 10*3/uL (ref 0.0–0.1)
Basophils Relative: 0 %
Eosinophils Absolute: 0 10*3/uL (ref 0.0–0.5)
Eosinophils Relative: 0 %
HCT: 25.4 % — ABNORMAL LOW (ref 36.0–46.0)
HEMOGLOBIN: 8.2 g/dL — AB (ref 12.0–15.0)
Immature Granulocytes: 0 %
LYMPHS ABS: 0.8 10*3/uL (ref 0.7–4.0)
LYMPHS PCT: 11 %
MCH: 27.9 pg (ref 26.0–34.0)
MCHC: 32.3 g/dL (ref 30.0–36.0)
MCV: 86.4 fL (ref 80.0–100.0)
Monocytes Absolute: 0.7 10*3/uL (ref 0.1–1.0)
Monocytes Relative: 9 %
Neutro Abs: 5.9 10*3/uL (ref 1.7–7.7)
Neutrophils Relative %: 80 %
Platelets: 139 10*3/uL — ABNORMAL LOW (ref 150–400)
RBC: 2.94 MIL/uL — AB (ref 3.87–5.11)
RDW: 14.3 % (ref 11.5–15.5)
WBC: 7.4 10*3/uL (ref 4.0–10.5)
nRBC: 0 % (ref 0.0–0.2)

## 2019-01-20 LAB — MAGNESIUM: Magnesium: 1.7 mg/dL (ref 1.7–2.4)

## 2019-01-20 MED ORDER — AMITRIPTYLINE HCL 50 MG PO TABS: 25.0000 mg | ORAL_TABLET | Freq: Every day | ORAL | Status: DC

## 2019-01-20 MED ORDER — TRAMADOL HCL 50 MG PO TABS: 50.0000 mg | ORAL_TABLET | Freq: Four times a day (QID) | ORAL | 0 refills | Status: DC | PRN

## 2019-01-20 MED ORDER — ONDANSETRON HCL 4 MG PO TABS: 4.0000 mg | ORAL_TABLET | Freq: Four times a day (QID) | ORAL | 0 refills | Status: DC | PRN

## 2019-01-20 MED ORDER — LEVOTHYROXINE SODIUM 125 MCG PO TABS: 62.5000 ug | ORAL_TABLET | Freq: Every day | ORAL | Status: DC

## 2019-01-20 NOTE — Progress Notes (Signed)
Lyman Bishop to be D/C'd  per MD order. Discussed with the patient and all questions fully answered.  VSS, Skin clean, dry and intact without evidence of skin break down, no evidence of skin tears noted.  IV catheter discontinued intact. Site without signs and symptoms of complications. Dressing and pressure applied.  An After Visit Summary was printed and given to the patient. Patient received prescription.  D/c education completed with patient/family including follow up instructions, medication list, d/c activities limitations if indicated, with other d/c instructions as indicated by MD - patient able to verbalize understanding, all questions fully answered.   Patient instructed to return to ED, call 911, or call MD for any changes in condition.   Patient to be escorted via WC, and D/C home via private auto.

## 2019-01-20 NOTE — Discharge Summary (Signed)
Physician Discharge Summary  Shelby Bradley:[id number] DOB: Apr 11, 1928 DOA: 01/17/2019  PCP: Lucky Cowboy, MD  Admit date: 01/17/2019 Discharge date: 01/20/2019  Admitted From: Home Disposition:  Home  Recommendations for Outpatient Follow-up:  1. Follow up with PCP in 1 week 2. Outpatient follow-up with general surgery 3. Follow up in ED if symptoms worsen or new appear   Home Health: Home with RN/PT Equipment/Devices: None  Discharge Condition: Stable CODE STATUS: Full Diet recommendation: Heart healthy  Brief/Interim Summary: 85 year old female with history of hypertension, hyperlipidemia, diabetes mellitus diet-controlled, reflux, hypothyroidism, macular degeneration presented on 01/18/2019 with abdominal pain, fever.  CT scan of the abdomen showed findings suggestive of cholecystitis.  She was started on IV antibiotics.  General surgery was consulted.She underwent laparoscopic cholecystectomy on 01/18/2019.  Postoperatively, she has remained stable except for weakness.  She is tolerating diet.  General surgery has cleared the patient for discharge.  Discharge Diagnoses:  Principal Problem:   Acute cholecystitis Active Problems:   Hypertension   COPD (chronic obstructive pulmonary disease) (HCC)  Acute cholecystitis with acute transaminitis -Currently on cefepime and Flagyl.  Status post laparoscopic cholecystectomy on 01/18/2019.  -Diet has been advanced per general surgery.  No more antibiotics needed as per general surgery recommendations.  General surgery has cleared the patient for discharge with outpatient follow-up with them.  Hypertension -Continue home regimen.  Outpatient follow-up  Hypokalemia -Replaced.    Hypothyroidism--continue Synthroid  Bipolar disorder -Continue Elavil; and Xanax as needed.  Outpatient follow-up  Discharge Instructions  Discharge Instructions    Call MD for:  persistant nausea and vomiting   Complete by:  As directed     Call MD for:  redness, tenderness, or signs of infection (pain, swelling, redness, odor or green/yellow discharge around incision site)   Complete by:  As directed    Call MD for:  severe uncontrolled pain   Complete by:  As directed    Call MD for:  temperature >100.4   Complete by:  As directed    Diet - low sodium heart healthy   Complete by:  As directed    Increase activity slowly   Complete by:  As directed      Allergies as of 01/20/2019      Reactions   Amlodipine Swelling   Swelling/edema   Norvasc [amlodipine Besylate]    Sulfa Antibiotics       Medication List    TAKE these medications   ALPRAZolam 0.5 MG tablet Commonly known as:  XANAX Take 1/2 tablet at hour of sleep ONLY if needed   amitriptyline 50 MG tablet Commonly known as:  ELAVIL Take 0.5 tablets (25 mg total) by mouth at bedtime. What changed:  how much to take   amLODipine 2.5 MG tablet Commonly known as:  NORVASC TAKE 1 TABLET BY MOUTH EVERY DAY   cyanocobalamin 1000 MCG/ML injection Commonly known as:  (VITAMIN B-12) INJECT 1 MILLILITER INTO THE SKIN EVERY 30 DAYS   ferrous sulfate 325 (65 FE) MG tablet Take 325 mg by mouth daily with breakfast.   levothyroxine 125 MCG tablet Commonly known as:  SYNTHROID, LEVOTHROID Take 0.5-1 tablets (62.5-125 mcg total) by mouth daily before breakfast. Taking 1 tablet on Tues, Thurs, all other days 1/2 tablet daily What changed:  See the new instructions.   omeprazole 40 MG capsule Commonly known as:  PRILOSEC TAKE 1 CAPSULE BY MOUTH EVERY DAY   ondansetron 4 MG tablet Commonly known as:  ZOFRAN Take 1 tablet (  4 mg total) by mouth every 6 (six) hours as needed for nausea.   traMADol 50 MG tablet Commonly known as:  Ultram Take 1 tablet (50 mg total) by mouth every 6 (six) hours as needed for severe pain.   Vitamin D-3 125 MCG (5000 UT) Tabs Take 5,000 Units by mouth daily.      Follow-up Information    [facility], [facility]. Call on  02/08/2019.   Specialty:  General Surgery Why:  Due to the coronavirus we are scheduling patient's for televisits. Please take a picture of your wound and email it to [email or web address] on the morning of your appointment. You will speak with the PA on 04/07 at 8:30 am.  Contact information: [address] [place] [address] [phone]        Lucky Cowboy, MD. Schedule an appointment as soon as possible for a visit in 1 week(s).   Specialty:  Internal Medicine Contact information: [address] [place] [address] [phone]          Allergies  Allergen Reactions  . Amlodipine Swelling    Swelling/edema  . Norvasc [Amlodipine Besylate]   . Sulfa Antibiotics     Consultations:  General surgery   Procedures/Studies: Dg Chest 2 View  Result Date: 01/17/2019 CLINICAL DATA:  Sepsis, fever EXAM: CHEST - 2 VIEW COMPARISON:  07/31/2016 FINDINGS: There is hyperinflation of the lungs compatible with COPD. Biapical scarring. Heart is normal size. No confluent opacities or effusions. Mild peribronchial thickening. IMPRESSION: Emphysema. Mild chronic bronchitic changes. Biapical scarring. No active disease. Electronically Signed   By: Charlett Nose M.D.   On: 01/17/2019 21:29   Dg Cholangiogram Operative  Result Date: 01/18/2019 CLINICAL DATA:  Intraoperative cholangiogram during laparoscopic cholecystectomy. EXAM: INTRAOPERATIVE CHOLANGIOGRAM FLUOROSCOPY TIME:  26 seconds COMPARISON:  CT abdomen pelvis-01/17/2019 FINDINGS: Intraoperative cholangiographic images of the right upper abdominal quadrant during laparoscopic cholecystectomy are provided for review. Surgical clips overlie the expected location of the gallbladder fossa. Contrast injection demonstrates selective cannulation of the central aspect of the cystic duct. There is passage of contrast through the central aspect of the cystic duct with filling of a non dilated common bile  duct. There is minimal reflux of injected contrast into the common hepatic duct and central aspect of the non dilated intrahepatic biliary system. There is minimal opacification the central aspect the pancreatic duct appears nondilated. There are no discrete filling defects within the opacified portions of the biliary system to suggest the presence of choledocholithiasis, however there is non visualization of the distal aspect of the CBD without definitive opacification of the duodenum. IMPRESSION: No definite evidence of choledocholithiasis, however there is nonvisualization of the distal aspect of the CBD without definitive opacification of the duodenum. Correlation with the operative report is advised. Electronically Signed   By: Simonne Come M.D.   On: 01/18/2019 11:40   Ct Abdomen Pelvis W Contrast  Result Date: 01/17/2019 CLINICAL DATA:  Right upper quadrant abdominal pain with nausea and diarrhea for 3 days. EXAM: CT ABDOMEN AND PELVIS WITH CONTRAST TECHNIQUE: Multidetector CT imaging of the abdomen and pelvis was performed using the standard protocol following bolus administration of intravenous contrast. CONTRAST:  80mL OMNIPAQUE IOHEXOL 300 MG/ML  SOLN COMPARISON:  None. FINDINGS: Lower chest: No acute findings. Several lung nodules, largest left upper lobe lingula abutting the left heart border measuring 6 mm. There are additional linear and reticular opacities that are likely due to scarring and/or subsegmental atelectasis. Hepatobiliary: Gallbladder  is distended. Wall thickening measures up to 8 mm. 1 cm dependent stone or possible polyp lies in the upper gallbladder segment. There is inflammation along the gastrohepatic ligament. No common bile duct dilation. Liver shows mild intrahepatic bile duct dilation. No liver masses or focal lesions. Pancreas: Unremarkable. No pancreatic ductal dilatation or surrounding inflammatory changes. Spleen: Normal in size without focal abnormality. Adrenals/Urinary  Tract: No adrenal masses. Mild bilateral renal cortical thinning. Left upper pole cyst, 1.1 cm. No other renal masses. No hydronephrosis. Ureters are normal in course and in caliber. Bladder is unremarkable. Stomach/Bowel: Small hiatal hernia. Stomach otherwise unremarkable. Second portion of the duodenum is compressed by the distended gallbladder. Small bowel and colon are normal in caliber. No wall thickening or inflammation. There are scattered left colon diverticula. No diverticulitis. No evidence of appendicitis. Vascular/Lymphatic: Aortic atherosclerosis. No enlarged abdominal or pelvic lymph nodes. Reproductive: Uterus and bilateral adnexa are unremarkable. Other: No abdominal wall hernia.  No ascites. Musculoskeletal: Mild to moderate chronic compression fracture of L2 no acute fracture. No osteoblastic or osteolytic lesions. IMPRESSION: 1. Acute cholecystitis. 2. No other acute abnormality within the abdomen or pelvis. 3. Small lung base nodules. No follow-up needed if patient is low-risk (and has no known or suspected primary neoplasm). Non-contrast chest CT can be considered in 12 months if patient is high-risk. This recommendation follows the consensus statement: Guidelines for Management of Incidental Pulmonary Nodules Detected on CT Images: From the Fleischner Society 2017; Radiology 2017; 284:228-243. Electronically Signed   By: Amie Portland M.D.   On: 01/17/2019 23:03       Subjective: Patient seen and examined at bedside.  She feels weak but otherwise denies any worsening abdominal pain, overnight fever or nausea or vomiting.  Discharge Exam: Vitals:   01/20/19 0142 01/20/19 0627  BP: 121/64 138/75  Pulse: 84 75  Resp: 16 16  Temp: 98.2 F (36.8 C) 97.6 F (36.4 C)  SpO2: 98% 99%   Vitals:   01/19/19 1250 01/19/19 2213 01/20/19 0142 01/20/19 0627  BP: 139/68 134/68 121/64 138/75  Pulse: 79 90 84 75  Resp: 18 17 16 16   Temp: 98 F (36.7 C) 97.8 F (36.6 C) 98.2 F (36.8  C) 97.6 F (36.4 C)  TempSrc: Oral Oral Oral Oral  SpO2: 99% 98% 98% 99%  Weight:      Height:        General: Pt is alert, awake, not in acute distress.  Elderly female lying in bed. Cardiovascular: rate controlled, S1/S2 + Respiratory: bilateral decreased breath sounds at bases Abdominal: Soft, NT, ND, bowel sounds + Extremities: no edema, no cyanosis    The results of significant diagnostics from this hospitalization (including imaging, microbiology, ancillary and laboratory) are listed below for reference.     Microbiology: Recent Results (from the past 240 hour(s))  Culture, blood (Routine x 2)     Status: None (Preliminary result)   Collection Time: 01/17/19  8:30 PM  Result Value Ref Range Status   Specimen Description BLOOD RIGHT ARM  Final   Special Requests   Final    BOTTLES DRAWN AEROBIC AND ANAEROBIC Blood Culture results may not be optimal due to an excessive volume of blood received in culture bottles   Culture   Final    NO GROWTH 2 DAYS Performed at [facility], [address], [place], [place] [address]    Report Status PENDING  Incomplete  Culture, blood (Routine x 2)     Status:  None (Preliminary result)   Collection Time: 01/17/19  8:45 PM  Result Value Ref Range Status   Specimen Description BLOOD RIGHT HAND  Final   Special Requests   Final    BOTTLES DRAWN AEROBIC ONLY Blood Culture results may not be optimal due to an excessive volume of blood received in culture bottles   Culture   Final    NO GROWTH 2 DAYS Performed at [facility], [address] [place], [place] [address]    Report Status PENDING  Incomplete  Urine culture     Status: Abnormal   Collection Time: 01/17/19  9:08 PM  Result Value Ref Range Status   Specimen Description URINE, CLEAN CATCH  Final   Special Requests   Final    NONE Performed at [facility], [address] [place], [place] [address]    Culture MULTIPLE SPECIES PRESENT, SUGGEST  RECOLLECTION (A)  Final   Report Status 01/18/2019 FINAL  Final  MRSA PCR Screening     Status: None   Collection Time: 01/18/19  8:39 AM  Result Value Ref Range Status   MRSA by PCR NEGATIVE NEGATIVE Final    Comment:        The GeneXpert MRSA Assay (FDA approved for NASAL specimens only), is one component of a comprehensive MRSA colonization surveillance program. It is not intended to diagnose MRSA infection nor to guide or monitor treatment for MRSA infections. Performed at [facility], [address] [place], [place] [address]      Labs: BNP (last 3 results) No results for input(s): BNP in the last 8760 hours. Basic Metabolic Panel: Recent Labs  Lab 01/17/19 2047 01/18/19 0154 01/19/19 0204 01/20/19 0138  NA 133* 131* 134* 136  K 3.5 3.1* 3.2* 3.4*  CL 104 103 108 111  CO2 19* 19* 21* 18*  GLUCOSE 134* 127* 161* 122*  BUN 20 17 16 12   CREATININE 1.07* 1.02* 0.86 0.77  CALCIUM 9.0 8.1* 7.9* 7.9*  MG  --   --   --  1.7   Liver Function Tests: Recent Labs  Lab 01/17/19 2047 01/18/19 0154 01/19/19 0204 01/20/19 0138  AST 124* 89* 49* 26  ALT 149* 113* 81* 60*  ALKPHOS 160* 143* 122 101  BILITOT 3.1* 3.1* 1.3* 0.5  PROT 6.4* 5.1* 4.4* 4.2*  ALBUMIN 3.4* 2.7* 2.2* 2.1*   Recent Labs  Lab 01/17/19 2057  LIPASE 59*   Recent Labs  Lab 01/19/19 2221  AMMONIA 29   CBC: Recent Labs  Lab 01/17/19 2047 01/19/19 0204 01/20/19 0138  WBC 7.3 9.0 7.4  NEUTROABS 6.8  --  5.9  HGB 9.7* 8.2* 8.2*  HCT 28.8* 24.2* 25.4*  MCV 87.8 87.7 86.4  PLT 138* 124* 139*   Cardiac Enzymes: No results for input(s): CKTOTAL, CKMB, CKMBINDEX, TROPONINI in the last 168 hours. BNP: Invalid input(s): POCBNP CBG: No results for input(s): GLUCAP in the last 168 hours. D-Dimer No results for input(s): DDIMER in the last 72 hours. Hgb A1c No results for input(s): HGBA1C in the last 72 hours. Lipid Profile No results for input(s): CHOL, HDL, LDLCALC, TRIG,  CHOLHDL, LDLDIRECT in the last 72 hours. Thyroid function studies No results for input(s): TSH, T4TOTAL, T3FREE, THYROIDAB in the last 72 hours.  Invalid input(s): FREET3 Anemia work up No results for input(s): VITAMINB12, FOLATE, FERRITIN, TIBC, IRON, RETICCTPCT in the last 72 hours. Urinalysis    Component Value Date/Time   COLORURINE YELLOW 01/17/2019  2042   APPEARANCEUR CLEAR 01/17/2019 2042   LABSPEC 1.011 01/17/2019 2042   PHURINE 5.0 01/17/2019 2042   GLUCOSEU NEGATIVE 01/17/2019 2042   HGBUR LARGE (A) 01/17/2019 2042   BILIRUBINUR NEGATIVE 01/17/2019 2042   KETONESUR 5 (A) 01/17/2019 2042   PROTEINUR NEGATIVE 01/17/2019 2042   UROBILINOGEN 0.2 06/29/2014 1436   NITRITE NEGATIVE 01/17/2019 2042   LEUKOCYTESUR SMALL (A) 01/17/2019 2042   Sepsis Labs Invalid input(s): PROCALCITONIN,  WBC,  LACTICIDVEN Microbiology Recent Results (from the past 240 hour(s))  Culture, blood (Routine x 2)     Status: None (Preliminary result)   Collection Time: 01/17/19  8:30 PM  Result Value Ref Range Status   Specimen Description BLOOD RIGHT ARM  Final   Special Requests   Final    BOTTLES DRAWN AEROBIC AND ANAEROBIC Blood Culture results may not be optimal due to an excessive volume of blood received in culture bottles   Culture   Final    NO GROWTH 2 DAYS Performed at [facility], [address] [place], [place] [address]    Report Status PENDING  Incomplete  Culture, blood (Routine x 2)     Status: None (Preliminary result)   Collection Time: 01/17/19  8:45 PM  Result Value Ref Range Status   Specimen Description BLOOD RIGHT HAND  Final   Special Requests   Final    BOTTLES DRAWN AEROBIC ONLY Blood Culture results may not be optimal due to an excessive volume of blood received in culture bottles   Culture   Final    NO GROWTH 2 DAYS Performed at [facility], [address], [place], [place] [address]    Report Status PENDING  Incomplete  Urine culture     Status:  Abnormal   Collection Time: 01/17/19  9:08 PM  Result Value Ref Range Status   Specimen Description URINE, CLEAN CATCH  Final   Special Requests   Final    NONE Performed at [facility], [address] [place], [place] [address]    Culture MULTIPLE SPECIES PRESENT, SUGGEST RECOLLECTION (A)  Final   Report Status 01/18/2019 FINAL  Final  MRSA PCR Screening     Status: None   Collection Time: 01/18/19  8:39 AM  Result Value Ref Range Status   MRSA by PCR NEGATIVE NEGATIVE Final    Comment:        The GeneXpert MRSA Assay (FDA approved for NASAL specimens only), is one component of a comprehensive MRSA colonization surveillance program. It is not intended to diagnose MRSA infection nor to guide or monitor treatment for MRSA infections. Performed at [facility], [address], [place], [place] [address]      Time coordinating discharge: 35 minutes  SIGNED:   Glade Lloyd, MD  [facility] 01/20/2019, 10:59 AM

## 2019-01-20 NOTE — TOC Progression Note (Signed)
Transition of Care [facility]) - Progression Note    Patient Details  Name: Shelby Bradley MRN: [id number] Date of Birth: 08-09-1928  Transition of Care [facility]) CM/SW Contact  Shelby Bradley Shelby Devon, RN Phone Number: 01/20/2019, 11:14 AM  Clinical Narrative:     Spoke to patient and daughter Shelby Bradley at bedside. [facility] would like [facility] and HHPT.   Patient has walker, 3 in 1 , tub bench at home already  Expected Discharge Plan: Home w Home Health Services Barriers to Discharge: No Barriers Identified  Expected Discharge Plan and Services Expected Discharge Plan: Home w Home Health Services Discharge Planning Services: CM Consult   Living arrangements for the past 2 months: Single Family Home Expected Discharge Date: 01/20/19               DME Arranged: Dan Humphreys rolling   HH Arranged: PT     Social Determinants of Health (SDOH) Interventions    Readmission Risk Interventions 30 Day Unplanned Readmission Risk Score     ED to Hosp-Admission (Current) from 01/17/2019 in [facility]  SURGICAL  30 Day Unplanned Readmission Risk Score (%)  13 Filed at 01/20/2019 0801     This score is the patient's risk of an unplanned readmission within 30 days of being discharged (0 -100%). The score is based on dignosis, age, lab data, medications, orders, and past utilization.   Low:  0-14.9   Medium: 15-21.9   High: 22-29.9   Extreme: 30 and above       No flowsheet data found.

## 2019-01-20 NOTE — Progress Notes (Signed)
[facility] Progress Note  2 Days Post-Op  Subjective: CC-  Sitting up in bed. Tired but overall feeling well. Denies abdominal pain. Ate the majority of her breakfast. Denies n/v. Passing flatus, no BM. Urinated this morning without any issues.  Objective: Vital signs in last 24 hours: Temp:  [97.6 F (36.4 C)-98.2 F (36.8 C)] 97.6 F (36.4 C) (03/19 0627) Pulse Rate:  [75-90] 75 (03/19 0627) Resp:  [16-18] 16 (03/19 0627) BP: (121-139)/(64-75) 138/75 (03/19 0627) SpO2:  [98 %-99 %] 99 % (03/19 0627) Last BM Date: 01/17/19  Intake/Output from previous day: 03/18 0701 - 03/19 0700 In: 1052 [P.O.:552; IV Piggyback:500] Out: 1020 [Urine:1020] Intake/Output this shift: No intake/output data recorded.  PE: Gen:  Alert, NAD, pleasant HEENT: EOM's intact, pupils equal and round Pulm:  effort normal Abd: Soft, NT/ND, +BS, no HSM, lap incisions C/D/I  Lab Results:  Recent Labs    01/19/19 0204 01/20/19 0138  WBC 9.0 7.4  HGB 8.2* 8.2*  HCT 24.2* 25.4*  PLT 124* 139*   BMET Recent Labs    01/19/19 0204 01/20/19 0138  NA 134* 136  K 3.2* 3.4*  CL 108 111  CO2 21* 18*  GLUCOSE 161* 122*  BUN 16 12  CREATININE 0.86 0.77  CALCIUM 7.9* 7.9*   PT/INR Recent Labs    01/17/19 2047  LABPROT 15.1  INR 1.2   CMP     Component Value Date/Time   NA 136 01/20/2019 0138   K 3.4 (L) 01/20/2019 0138   CL 111 01/20/2019 0138   CO2 18 (L) 01/20/2019 0138   GLUCOSE 122 (H) 01/20/2019 0138   BUN 12 01/20/2019 0138   CREATININE 0.77 01/20/2019 0138   CREATININE 0.92 (H) 01/05/2019 1455   CALCIUM 7.9 (L) 01/20/2019 0138   PROT 4.2 (L) 01/20/2019 0138   ALBUMIN 2.1 (L) 01/20/2019 0138   AST 26 01/20/2019 0138   ALT 60 (H) 01/20/2019 0138   ALKPHOS 101 01/20/2019 0138   BILITOT 0.5 01/20/2019 0138   GFRNONAA >60 01/20/2019 0138   GFRNONAA 56 (L) 01/05/2019 1455   GFRAA >60 01/20/2019 0138   GFRAA 64 01/05/2019 1455   Lipase     Component Value  Date/Time   LIPASE 59 (H) 01/17/2019 2057       Studies/Results: Dg Cholangiogram Operative  Result Date: 01/18/2019 CLINICAL DATA:  Intraoperative cholangiogram during laparoscopic cholecystectomy. EXAM: INTRAOPERATIVE CHOLANGIOGRAM FLUOROSCOPY TIME:  26 seconds COMPARISON:  CT abdomen pelvis-01/17/2019 FINDINGS: Intraoperative cholangiographic images of the right upper abdominal quadrant during laparoscopic cholecystectomy are provided for review. Surgical clips overlie the expected location of the gallbladder fossa. Contrast injection demonstrates selective cannulation of the central aspect of the cystic duct. There is passage of contrast through the central aspect of the cystic duct with filling of a non dilated common bile duct. There is minimal reflux of injected contrast into the common hepatic duct and central aspect of the non dilated intrahepatic biliary system. There is minimal opacification the central aspect the pancreatic duct appears nondilated. There are no discrete filling defects within the opacified portions of the biliary system to suggest the presence of choledocholithiasis, however there is non visualization of the distal aspect of the CBD without definitive opacification of the duodenum. IMPRESSION: No definite evidence of choledocholithiasis, however there is nonvisualization of the distal aspect of the CBD without definitive opacification of the duodenum. Correlation with the operative report is advised. Electronically Signed   By: Simonne Come M.D.   On:  01/18/2019 11:40    Anti-infectives: Anti-infectives (From admission, onward)   Start     Dose/Rate Route Frequency Ordered Stop   01/18/19 1000  ceFEPIme (MAXIPIME) 1 g in sodium chloride 0.9 % 100 mL IVPB     1 g 200 mL/hr over 30 Minutes Intravenous Every 12 hours 01/17/19 2150     01/18/19 0600  metroNIDAZOLE (FLAGYL) IVPB 500 mg     500 mg 100 mL/hr over 60 Minutes Intravenous Every 8 hours 01/17/19 2356      01/17/19 2115  ceFEPIme (MAXIPIME) 2 g in sodium chloride 0.9 % 100 mL IVPB     2 g 200 mL/hr over 30 Minutes Intravenous  Once 01/17/19 2109 01/17/19 2341   01/17/19 2115  metroNIDAZOLE (FLAGYL) IVPB 500 mg     500 mg 100 mL/hr over 60 Minutes Intravenous  Once 01/17/19 2109 01/17/19 2342       Assessment/Plan Acute Cholecystitis  s/p Laparoscopic Cholecystectomy with IOC, Dr. Fredricka Bonine, 3/17 - POD 2 - IOC without definite choledocholithiasis, T bili and LFT's downtrending. - pain controlled, tolerating diet  FEN - Regular VTE - SCDs, okay for chemical prophylaxis from surgical standpoint ID - WBC wnl, afebrile; Cefepime, Flagyl 3/16 >>> Foley - None Follow Up - 02/08/2019 @ 830am  Plan: Patient stable for discharge from surgical standpoint. She does not need any more antibiotics. Discharge instructions and f/u info on AVS. rx for tramadol sent to pharmacy.   LOS: 2 days    Franne Forts , [facility] Surgery 01/20/2019, 10:28 AM Pager: [phone] Mon-Thurs 7:00 am-4:30 pm Fri 7:00 am -11:30 AM Sat-Sun 7:00 am-11:30 am

## 2019-01-20 NOTE — Discharge Instructions (Signed)
[facility] ° °Please arrive at least 30 min before your appointment to complete your check in paperwork.  If you are unable to arrive 30 min prior to your appointment time we may have to cancel or reschedule you. °LAPAROSCOPIC SURGERY: POST OP INSTRUCTIONS °Always review your discharge instruction sheet given to you by the facility where your surgery was performed. °IF YOU HAVE DISABILITY OR FAMILY LEAVE FORMS, YOU MUST BRING THEM TO THE OFFICE FOR PROCESSING.   °DO NOT GIVE THEM TO YOUR DOCTOR. ° °PAIN CONTROL ° °1. First take acetaminophen (Tylenol) AND/or ibuprofen (Advil) to control your pain after surgery.  Follow directions on package.  Taking acetaminophen (Tylenol) and/or ibuprofen (Advil) regularly after surgery will help to control your pain and lower the amount of prescription pain medication you may need.  You should not take more than 4,000 mg (4 grams) of acetaminophen (Tylenol) in 24 hours.  You should not take ibuprofen (Advil), aleve, motrin, naprosyn or other NSAIDS if you have a history of stomach ulcers or chronic kidney disease.  °2. A prescription for pain medication may be given to you upon discharge.  Take your pain medication as prescribed, if you still have uncontrolled pain after taking acetaminophen (Tylenol) or ibuprofen (Advil). °3. Use ice packs to help control pain. °4. If you need a refill on your pain medication, please contact your pharmacy.  They will contact our office to request authorization. Prescriptions will not be filled after 5pm or on week-ends. ° °HOME MEDICATIONS °5. Take your usually prescribed medications unless otherwise directed. ° °DIET °6. You should follow a light diet the first few days after arrival home.  Be sure to include lots of fluids daily. Avoid fatty, fried foods.  ° °CONSTIPATION °7. It is common to experience some constipation after surgery and if you are taking pain medication.  Increasing fluid intake and taking a stool  softener (such as Colace) will usually help or prevent this problem from occurring.  A mild laxative (Milk of Magnesia or Miralax) should be taken according to package instructions if there are no bowel movements after 48 hours. ° °WOUND/INCISION CARE °8. Most patients will experience some swelling and bruising in the area of the incisions.  Ice packs will help.  Swelling and bruising can take several days to resolve.  °9. Unless discharge instructions indicate otherwise, follow guidelines below  °a. STERI-STRIPS - you may remove your outer bandages 48 hours after surgery, and you may shower at that time.  You have steri-strips (small skin tapes) in place directly over the incision.  These strips should be left on the skin for 7-10 days.   °b. DERMABOND/SKIN GLUE - you may shower in 24 hours.  The glue will flake off over the next 2-3 weeks. °10. Any sutures or staples will be removed at the office during your follow-up visit. ° °ACTIVITIES °11. You may resume regular (light) daily activities beginning the next day--such as daily self-care, walking, climbing stairs--gradually increasing activities as tolerated.  You may have sexual intercourse when it is comfortable.  Refrain from any heavy lifting or straining until approved by your doctor. °a. You may drive when you are no longer taking prescription pain medication, you can comfortably wear a seatbelt, and you can safely maneuver your car and apply brakes. ° °FOLLOW-UP °12. You should see your doctor in the office for a follow-up appointment approximately 2-3 weeks after your surgery.  You should have been given your post-op/follow-up appointment when   your surgery was scheduled.  If you did not receive a post-op/follow-up appointment, make sure that you call for this appointment within a day or two after you arrive home to insure a convenient appointment time. ° °OTHER INSTRUCTIONS ° °WHEN TO CALL YOUR DOCTOR: °1. Fever over 101.0 °2. Inability to  urinate °3. Continued bleeding from incision. °4. Increased pain, redness, or drainage from the incision. °5. Increasing abdominal pain ° °The clinic staff is available to answer your questions during regular business hours.  Please don’t hesitate to call and ask to speak to one of the nurses for clinical concerns.  If you have a medical emergency, go to the nearest emergency room or call 911.  A surgeon from [facility] is always on call at the hospital. °[address], [address], [place], [place]  [address] ? [address] [address], [place], [place]   [address] °[phone] ? [phone] ? FAX [phone] ° ° ° °

## 2019-01-21 DIAGNOSIS — Z9049 Acquired absence of other specified parts of digestive tract: Secondary | ICD-10-CM | POA: Diagnosis not present

## 2019-01-21 DIAGNOSIS — Z48815 Encounter for surgical aftercare following surgery on the digestive system: Secondary | ICD-10-CM | POA: Diagnosis not present

## 2019-01-21 NOTE — Anesthesia Postprocedure Evaluation (Signed)
Anesthesia Post Note  Patient: Shelby Bradley  Procedure(s) Performed: LAPAROSCOPIC CHOLECYSTECTOMY WITH INTRAOPERATIVE CHOLANGIOGRAM (N/A )     Patient location during evaluation: PACU Anesthesia Type: General Level of consciousness: awake and alert Pain management: pain level controlled Vital Signs Assessment: post-procedure vital signs reviewed and stable Respiratory status: spontaneous breathing, nonlabored ventilation, respiratory function stable and patient connected to nasal cannula oxygen Cardiovascular status: blood pressure returned to baseline and stable Postop Assessment: no apparent nausea or vomiting Anesthetic complications: no    Last Vitals:  Vitals:   01/20/19 0142 01/20/19 0627  BP: 121/64 138/75  Pulse: 84 75  Resp: 16 16  Temp: 36.8 C 36.4 C  SpO2: 98% 99%    Last Pain:  Vitals:   01/20/19 0805  TempSrc:   PainSc: 2                  Sharlot Sturkey

## 2019-01-22 LAB — CULTURE, BLOOD (ROUTINE X 2)
Culture: NO GROWTH
Culture: NO GROWTH

## 2019-01-24 ENCOUNTER — Other Ambulatory Visit: Payer: Self-pay | Admitting: Internal Medicine

## 2019-01-24 MED ORDER — ALPRAZOLAM 0.25 MG PO TABS: ORAL_TABLET | ORAL | 0 refills | Status: DC

## 2019-01-25 DIAGNOSIS — Z9049 Acquired absence of other specified parts of digestive tract: Secondary | ICD-10-CM | POA: Diagnosis not present

## 2019-01-25 DIAGNOSIS — Z48815 Encounter for surgical aftercare following surgery on the digestive system: Secondary | ICD-10-CM | POA: Diagnosis not present

## 2019-01-26 NOTE — Progress Notes (Signed)
Hospital follow up  Assessment and Plan: Hospital visit follow up for   Acute cholecystitis Healing well Non tender AB Monitor diarrhea  Other iron deficiency anemia Recheck, given fusion plus, may want RX -     CBC with Differential/Platelet -     Iron,Total/Total Iron Binding Cap -     Ferritin  Malnutrition of mild degree (HCC) Continue boost/ensure -     COMPLETE METABOLIC PANEL WITH GFR  Chronic obstructive pulmonary disease, unspecified COPD type (HCC) No triggers, well controlled symptoms, cont to monitor Discussed increase risk for complications with COVID19 and precautions patient can take to keep herself safe such as social distancing, hand washing, avoiding touching her face. She will call the office if she has a fever, any SOB, cough, etc.   B12 deficiency -     Vitamin B12 - shot given in the office  Primary osteoarthritis of both knees -     diclofenac sodium (VOLTAREN) 1 % GEL; Apply 4 g topically 4 (four) times daily.  Urinary frequency -     Urinalysis, Routine w reflex microscopic -     Urine Culture  Diarrhea No fever, no odor, not very frequent, no need to test for Cdiff at this time but discussed with daughter if not improving with diet may need to test Can do imodium AS needed but likely secondary to gallbladder removal, decrease fatty foods.     All medications were reviewed with patient and family and fully reconciled. All questions answered fully, and patient and family members were encouraged to call the office with any further questions or concerns. Discussed goal to avoid readmission related to this diagnosis.  There are no discontinued medications.  Over 40 minutes of exam, counseling, chart review, and complex, high/moderate level critical decision making was performed this visit.   Future Appointments  Date Time Provider Department Center  02/02/2019  2:00 PM [facility] NURSE [facility] None  03/21/2019 12:30 PM Sherrie George, MD  [facility] None  05/09/2019  2:30 PM Lucky Cowboy, MD [facility] None  09/13/2019 11:15 AM Quentin Mulling, PA-C [facility] None     HPI 85 y.o.female presents for follow up for transition from recent hospitalization or SNIF stay.   Admit date to the hospital was 01/17/19, patient was discharged from the hospital on 01/20/19 and our clinical staff contacted the office the day after discharge to set up a follow up appointment. The discharge summary, medications, and diagnostic test results were reviewed before meeting with the patient. The patient was admitted for: Cholecysitis presented on 01/18/2019 with abdominal pain, fever. CT scan of the abdomen showed findings suggestive of cholecystitis. She was started on IV antibiotics. General surgery was consulted.She underwent laparoscopic cholecystectomy on 01/18/2019.  Postoperatively, she has remained stable except for weakness.    She is at [facility] her daughters house temporarily until she gets more strength, she is doing her own meds, uses a cane at the house and walks with a walker long distance. She is not driving. She is getting PT at home, has had 2 visits, states she has pain in bilateral knees, wants a cream.  She is on xanax 0.25 mg 1/2 as needed for anxiety.  She is on amitriptyline  1/2 at night, would like to try 1 pill a day.  She has been on her iron pills 2 x a day for 2 days.   Daughter states she had a hallucination yesterday, about 5 mins of confusion, but she is doing well now.  She is having some loose stools since being in the hospital. She has been taking imodium.   Lab Results  Component Value Date   WBC 7.4 01/20/2019   HGB 8.2 (L) 01/20/2019   HCT 25.4 (L) 01/20/2019   MCV 86.4 01/20/2019   PLT 139 (L) 01/20/2019   Lab Results  Component Value Date   CREATININE 0.77 01/20/2019   BUN 12 01/20/2019   NA 136 01/20/2019   K 3.4 (L) 01/20/2019   CL 111 01/20/2019   CO2 18 (L) 01/20/2019   Lab Results   Component Value Date   ALT 60 (H) 01/20/2019   AST 26 01/20/2019   ALKPHOS 101 01/20/2019   BILITOT 0.5 01/20/2019   Lab Results  Component Value Date   TSH 3.59 01/05/2019     Home health is involved.   Images while in the hospital: Dg Chest 2 View  Result Date: 01/17/2019 CLINICAL DATA:  Sepsis, fever EXAM: CHEST - 2 VIEW COMPARISON:  07/31/2016 FINDINGS: There is hyperinflation of the lungs compatible with COPD. Biapical scarring. Heart is normal size. No confluent opacities or effusions. Mild peribronchial thickening. IMPRESSION: Emphysema. Mild chronic bronchitic changes. Biapical scarring. No active disease. Electronically Signed   By: Charlett Nose M.D.   On: 01/17/2019 21:29   Dg Cholangiogram Operative  Result Date: 01/18/2019 CLINICAL DATA:  Intraoperative cholangiogram during laparoscopic cholecystectomy. EXAM: INTRAOPERATIVE CHOLANGIOGRAM FLUOROSCOPY TIME:  26 seconds COMPARISON:  CT abdomen pelvis-01/17/2019 FINDINGS: Intraoperative cholangiographic images of the right upper abdominal quadrant during laparoscopic cholecystectomy are provided for review. Surgical clips overlie the expected location of the gallbladder fossa. Contrast injection demonstrates selective cannulation of the central aspect of the cystic duct. There is passage of contrast through the central aspect of the cystic duct with filling of a non dilated common bile duct. There is minimal reflux of injected contrast into the common hepatic duct and central aspect of the non dilated intrahepatic biliary system. There is minimal opacification the central aspect the pancreatic duct appears nondilated. There are no discrete filling defects within the opacified portions of the biliary system to suggest the presence of choledocholithiasis, however there is non visualization of the distal aspect of the CBD without definitive opacification of the duodenum. IMPRESSION: No definite evidence of choledocholithiasis, however  there is nonvisualization of the distal aspect of the CBD without definitive opacification of the duodenum. Correlation with the operative report is advised. Electronically Signed   By: Simonne Come M.D.   On: 01/18/2019 11:40   Ct Abdomen Pelvis W Contrast  Result Date: 01/17/2019 CLINICAL DATA:  Right upper quadrant abdominal pain with nausea and diarrhea for 3 days. EXAM: CT ABDOMEN AND PELVIS WITH CONTRAST TECHNIQUE: Multidetector CT imaging of the abdomen and pelvis was performed using the standard protocol following bolus administration of intravenous contrast. CONTRAST:  4mL OMNIPAQUE IOHEXOL 300 MG/ML  SOLN COMPARISON:  None. FINDINGS: Lower chest: No acute findings. Several lung nodules, largest left upper lobe lingula abutting the left heart border measuring 6 mm. There are additional linear and reticular opacities that are likely due to scarring and/or subsegmental atelectasis. Hepatobiliary: Gallbladder is distended. Wall thickening measures up to 8 mm. 1 cm dependent stone or possible polyp lies in the upper gallbladder segment. There is inflammation along the gastrohepatic ligament. No common bile duct dilation. Liver shows mild intrahepatic bile duct dilation. No liver masses or focal lesions. Pancreas: Unremarkable. No pancreatic ductal dilatation or surrounding inflammatory changes. Spleen: Normal in size without focal abnormality. Adrenals/Urinary  Tract: No adrenal masses. Mild bilateral renal cortical thinning. Left upper pole cyst, 1.1 cm. No other renal masses. No hydronephrosis. Ureters are normal in course and in caliber. Bladder is unremarkable. Stomach/Bowel: Small hiatal hernia. Stomach otherwise unremarkable. Second portion of the duodenum is compressed by the distended gallbladder. Small bowel and colon are normal in caliber. No wall thickening or inflammation. There are scattered left colon diverticula. No diverticulitis. No evidence of appendicitis. Vascular/Lymphatic: Aortic  atherosclerosis. No enlarged abdominal or pelvic lymph nodes. Reproductive: Uterus and bilateral adnexa are unremarkable. Other: No abdominal wall hernia.  No ascites. Musculoskeletal: Mild to moderate chronic compression fracture of L2 no acute fracture. No osteoblastic or osteolytic lesions. IMPRESSION: 1. Acute cholecystitis. 2. No other acute abnormality within the abdomen or pelvis. 3. Small lung base nodules. No follow-up needed if patient is low-risk (and has no known or suspected primary neoplasm). Non-contrast chest CT can be considered in 12 months if patient is high-risk. This recommendation follows the consensus statement: Guidelines for Management of Incidental Pulmonary Nodules Detected on CT Images: From the Fleischner Society 2017; Radiology 2017; 284:228-243. Electronically Signed   By: Amie Portland M.D.   On: 01/17/2019 23:03     Current Outpatient Medications (Endocrine & Metabolic):  .  levothyroxine (SYNTHROID, LEVOTHROID) 125 MCG tablet, Take 0.5-1 tablets (62.5-125 mcg total) by mouth daily before breakfast. Taking 1 tablet on Tues, Thurs, all other days 1/2 tablet daily  Current Outpatient Medications (Cardiovascular):  .  amLODipine (NORVASC) 2.5 MG tablet, TAKE 1 TABLET BY MOUTH EVERY DAY   Current Outpatient Medications (Analgesics):  .  traMADol (ULTRAM) 50 MG tablet, Take 1 tablet (50 mg total) by mouth every 6 (six) hours as needed for severe pain.  Current Outpatient Medications (Hematological):  .  cyanocobalamin (,VITAMIN B-12,) 1000 MCG/ML injection, INJECT 1 MILLILITER INTO THE SKIN EVERY 30 DAYS .  ferrous sulfate 325 (65 FE) MG tablet, Take 325 mg by mouth daily with breakfast.  Current Outpatient Medications (Other):  Marland Kitchen  ALPRAZolam (XANAX) 0.25 MG tablet, Take 1 tablet at hour of sleep ONLY if needed .  amitriptyline (ELAVIL) 50 MG tablet, Take 0.5 tablets (25 mg total) by mouth at bedtime. .  Cholecalciferol (VITAMIN D-3) 5000 UNITS TABS, Take 5,000 Units  by mouth daily. Marland Kitchen  omeprazole (PRILOSEC) 40 MG capsule, TAKE 1 CAPSULE BY MOUTH EVERY DAY .  diclofenac sodium (VOLTAREN) 1 % GEL, Apply 4 g topically 4 (four) times daily. .  ondansetron (ZOFRAN) 4 MG tablet, Take 1 tablet (4 mg total) by mouth every 6 (six) hours as needed for nausea.  Past Medical History:  Diagnosis Date  . Allergy   . Anemia   . Anxiety   . DDD (degenerative disc disease), lumbar   . Depression   . GERD (gastroesophageal reflux disease)   . Hyperlipidemia   . Hypertension   . OA (osteoarthritis) of knee    right knee  . Pre-diabetes   . Thyroid disease   . Vitamin D deficiency      Allergies  Allergen Reactions  . Amlodipine Swelling    Swelling/edema  . Norvasc [Amlodipine Besylate]   . Sulfa Antibiotics     ROS: all negative except above.   Physical Exam: Filed Weights   01/27/19 1131  Weight: 111 lb 9.6 oz (50.6 kg)   BP (!) 148/70   Pulse 92   Temp 97.9 F (36.6 C)   Ht 5' 3.5" (1.613 m)   Wt 111 lb 9.6 oz (  50.6 kg)   SpO2 98%   BMI 19.46 kg/m  General appearance: alert, no distress, fraile/thin appearing  female HEENT: Left eye ptosis, normocephalic, sclerae anicteric, TM pearly on left, right TM white/scarred or absent TM, nares patent, no discharge or erythema, pharynx normal Oral cavity: MMM, no lesions, Slight depapillation of left tongue, smooth, slightly erythematous but no obvious oral lesions Neck: supple, no lymphadenopathy, no thyromegaly, no masses Heart: RRR, normal S1, S2, no murmurs Lungs: CTA bilaterally, no wheezes, rhonchi, or rales Abdomen: +bs, soft, with well healing 5 surgical lap scar with mild healing ecchymosis without redness, swelling, discharge, non distended, nontender, no masses, no hepatomegaly, no splenomegaly Musculoskeletal: nontender, no swelling, no obvious deformity, mild- moderate cervical kyphosis, walks with walker Extremities: no edema, no cyanosis, no clubbing Pulses: 2+ symmetric, upper and  lower extremities, normal cap refill Neurological: alert, oriented x 3, CN2-12 intact, strength normal upper extremities and lower extremities, sensation normal throughout, DTRs 2+ throughout, no cerebellar signs, gait normal Psychiatric: normal affect, behavior normal, pleasant      Quentin Mulling, PA-C 12:15 PM [facility]

## 2019-01-27 ENCOUNTER — Other Ambulatory Visit: Payer: Self-pay

## 2019-01-27 ENCOUNTER — Encounter: Payer: Self-pay | Admitting: Physician Assistant

## 2019-01-27 ENCOUNTER — Ambulatory Visit (INDEPENDENT_AMBULATORY_CARE_PROVIDER_SITE_OTHER): Payer: Medicare Other | Admitting: Physician Assistant

## 2019-01-27 VITALS — BP 148/70 | HR 92 | Temp 97.9°F | Ht 63.5 in | Wt 111.6 lb

## 2019-01-27 DIAGNOSIS — K810 Acute cholecystitis: Secondary | ICD-10-CM | POA: Diagnosis not present

## 2019-01-27 DIAGNOSIS — M170 Bilateral primary osteoarthritis of knee: Secondary | ICD-10-CM | POA: Diagnosis not present

## 2019-01-27 DIAGNOSIS — D508 Other iron deficiency anemias: Secondary | ICD-10-CM

## 2019-01-27 DIAGNOSIS — J449 Chronic obstructive pulmonary disease, unspecified: Secondary | ICD-10-CM | POA: Diagnosis not present

## 2019-01-27 DIAGNOSIS — R350 Frequency of micturition: Secondary | ICD-10-CM | POA: Diagnosis not present

## 2019-01-27 DIAGNOSIS — E538 Deficiency of other specified B group vitamins: Secondary | ICD-10-CM | POA: Diagnosis not present

## 2019-01-27 DIAGNOSIS — E441 Mild protein-calorie malnutrition: Secondary | ICD-10-CM | POA: Diagnosis not present

## 2019-01-27 MED ORDER — DICLOFENAC SODIUM 1 % TD GEL: 4.0000 g | Freq: Four times a day (QID) | TRANSDERMAL | 3 refills | Status: DC

## 2019-01-27 NOTE — Patient Instructions (Addendum)
Can take 1/2 of the xanax as needed for anxiety  Can take 1/2 of the amitriptyline at night, IF you wake up at night you CAN take the second half of the amitriptyline.   Try the fusion plus samples once a day After you use this use the iron/vitaminC/and the sample probiotic to prevent constipation    Increase boost/ensure but can do 1/2 and then wait and do the other 1/2  WEIGHT GAIN  Try to make sure you are eating plenty of high calorie dense foods like: Nuts Peanut butter Oatmeal Greek yogurt Protein powder Ensure/Boost  Fat and Cholesterol Restricted Eating Plan Getting too much fat and cholesterol in your diet may cause health problems. Choosing the right foods helps keep your fat and cholesterol at normal levels. This can keep you from getting certain diseases. Your doctor may recommend an eating plan that includes:  Total fat: ______% or less of total calories a day.  Saturated fat: ______% or less of total calories a day.  Cholesterol: less than _________mg a day.  Fiber: ______g a day. What are tips for following this plan? Meal planning  At meals, divide your plate into four equal parts: ? Fill one-half of your plate with vegetables and green salads. ? Fill one-fourth of your plate with whole grains. ? Fill one-fourth of your plate with low-fat (lean) protein foods.  Eat fish that is high in omega-3 fats at least two times a week. This includes mackerel, tuna, sardines, and salmon.  Eat foods that are high in fiber, such as whole grains, beans, apples, broccoli, carrots, peas, and barley. General tips   Work with your doctor to lose weight if you need to.  Avoid: ? Foods with added sugar. ? Fried foods. ? Foods with partially hydrogenated oils.  Limit alcohol intake to no more than 1 drink a day for nonpregnant women and 2 drinks a day for men. One drink equals 12 oz of beer, 5 oz of wine, or 1 oz of hard liquor. Reading food labels  Check food labels  for: ? Trans fats. ? Partially hydrogenated oils. ? Saturated fat (g) in each serving. ? Cholesterol (mg) in each serving. ? Fiber (g) in each serving.  Choose foods with healthy fats, such as: ? Monounsaturated fats. ? Polyunsaturated fats. ? Omega-3 fats.  Choose grain products that have whole grains. Look for the word "whole" as the first word in the ingredient list. Cooking  Cook foods using low-fat methods. These include baking, boiling, grilling, and broiling.  Eat more home-cooked foods. Eat at restaurants and buffets less often.  Avoid cooking using saturated fats, such as butter, cream, palm oil, palm kernel oil, and coconut oil. Recommended foods  Fruits  All fresh, canned (in natural juice), or frozen fruits. Vegetables  Fresh or frozen vegetables (raw, steamed, roasted, or grilled). Green salads. Grains  Whole grains, such as whole wheat or whole grain breads, crackers, cereals, and pasta. Unsweetened oatmeal, bulgur, barley, quinoa, or brown rice. Corn or whole wheat flour tortillas. Meats and other protein foods  Ground beef (85% or leaner), grass-fed beef, or beef trimmed of fat. Skinless chicken or [place]. Ground chicken or [place]. Pork trimmed of fat. All fish and seafood. Egg whites. Dried beans, peas, or lentils. Unsalted nuts or seeds. Unsalted canned beans. Nut butters without added sugar or oil. Dairy  Low-fat or nonfat dairy products, such as skim or 1% milk, 2% or reduced-fat cheeses, low-fat and fat-free ricotta or cottage cheese, or  plain low-fat and nonfat yogurt. Fats and oils  Tub margarine without trans fats. Light or reduced-fat mayonnaise and salad dressings. Avocado. Olive, canola, sesame, or safflower oils. The items listed above may not be a complete list of foods and beverages you can eat. Contact a dietitian for more information. Foods to avoid Fruits  Canned fruit in heavy syrup. Fruit in cream or butter sauce. Fried  fruit. Vegetables  Vegetables cooked in cheese, cream, or butter sauce. Fried vegetables. Grains  White bread. White pasta. White rice. Cornbread. Bagels, pastries, and croissants. Crackers and snack foods that contain trans fat and hydrogenated oils. Meats and other protein foods  Fatty cuts of meat. Ribs, chicken wings, bacon, sausage, bologna, salami, chitterlings, fatback, hot dogs, bratwurst, and packaged lunch meats. Liver and organ meats. Whole eggs and egg yolks. Chicken and [place] with skin. Fried meat. Dairy  Whole or 2% milk, cream, half-and-half, and cream cheese. Whole milk cheeses. Whole-fat or sweetened yogurt. Full-fat cheeses. Nondairy creamers and whipped toppings. Processed cheese, cheese spreads, and cheese curds. Beverages  Alcohol. Sugar-sweetened drinks such as sodas, lemonade, and fruit drinks. Fats and oils  Butter, stick margarine, lard, shortening, ghee, or bacon fat. Coconut, palm kernel, and palm oils. Sweets and desserts  Corn syrup, sugars, honey, and molasses. Candy. Jam and jelly. Syrup. Sweetened cereals. Cookies, pies, cakes, donuts, muffins, and ice cream. The items listed above may not be a complete list of foods and beverages you should avoid. Contact a dietitian for more information. Summary  Choosing the right foods helps keep your fat and cholesterol at normal levels. This can keep you from getting certain diseases.  At meals, fill one-half of your plate with vegetables and green salads.  Eat high-fiber foods, like whole grains, beans, apples, carrots, peas, and barley.  Limit added sugar, saturated fats, alcohol, and fried foods. This information is not intended to replace advice given to you by your health care provider. Make sure you discuss any questions you have with your health care provider. Document Released: 04/20/2012 Document Revised: 06/23/2018 Document Reviewed: 07/07/2017 Elsevier Interactive Patient Education  2019 [employer].

## 2019-01-27 NOTE — Progress Notes (Signed)
B12 therapeutic injection administered, 0.5 ml  inRight deltoid.Pt reports no concerns at this time.

## 2019-01-28 DIAGNOSIS — Z9049 Acquired absence of other specified parts of digestive tract: Secondary | ICD-10-CM | POA: Diagnosis not present

## 2019-01-28 DIAGNOSIS — Z48815 Encounter for surgical aftercare following surgery on the digestive system: Secondary | ICD-10-CM | POA: Diagnosis not present

## 2019-01-28 LAB — CBC WITH DIFFERENTIAL/PLATELET
Absolute Monocytes: 616 cells/uL (ref 200–950)
Basophils Absolute: 23 cells/uL (ref 0–200)
Basophils Relative: 0.3 %
Eosinophils Absolute: 39 cells/uL (ref 15–500)
Eosinophils Relative: 0.5 %
HCT: 27.7 % — ABNORMAL LOW (ref 35.0–45.0)
Hemoglobin: 9.3 g/dL — ABNORMAL LOW (ref 11.7–15.5)
Lymphs Abs: 1178 cells/uL (ref 850–3900)
MCH: 28.6 pg (ref 27.0–33.0)
MCHC: 33.6 g/dL (ref 32.0–36.0)
MCV: 85.2 fL (ref 80.0–100.0)
MPV: 9.4 fL (ref 7.5–12.5)
Monocytes Relative: 8 %
NEUTROS PCT: 75.9 %
Neutro Abs: 5844 cells/uL (ref 1500–7800)
Platelets: 405 10*3/uL — ABNORMAL HIGH (ref 140–400)
RBC: 3.25 10*6/uL — ABNORMAL LOW (ref 3.80–5.10)
RDW: 13.5 % (ref 11.0–15.0)
Total Lymphocyte: 15.3 %
WBC: 7.7 10*3/uL (ref 3.8–10.8)

## 2019-01-28 LAB — COMPLETE METABOLIC PANEL WITH GFR
AG Ratio: 1.4 (calc) (ref 1.0–2.5)
ALT: 41 U/L — ABNORMAL HIGH (ref 6–29)
AST: 55 U/L — AB (ref 10–35)
Albumin: 3.3 g/dL — ABNORMAL LOW (ref 3.6–5.1)
Alkaline phosphatase (APISO): 190 U/L — ABNORMAL HIGH (ref 37–153)
BUN: 10 mg/dL (ref 7–25)
CO2: 25 mmol/L (ref 20–32)
Calcium: 9 mg/dL (ref 8.6–10.4)
Chloride: 105 mmol/L (ref 98–110)
Creat: 0.66 mg/dL (ref 0.60–0.88)
GFR, Est African American: 91 mL/min/{1.73_m2} (ref >=60)
GFR, Est Non African American: 79 mL/min/{1.73_m2} (ref >=60)
Globulin: 2.4 g/dL (calc) (ref 1.9–3.7)
Glucose, Bld: 89 mg/dL (ref 65–99)
Potassium: 3.7 mmol/L (ref 3.5–5.3)
Sodium: 140 mmol/L (ref 135–146)
Total Bilirubin: 0.7 mg/dL (ref 0.2–1.2)
Total Protein: 5.7 g/dL — ABNORMAL LOW (ref 6.1–8.1)

## 2019-01-28 LAB — URINALYSIS, ROUTINE W REFLEX MICROSCOPIC
Bilirubin Urine: NEGATIVE
Glucose, UA: NEGATIVE
Hgb urine dipstick: NEGATIVE
KETONES UR: NEGATIVE
Leukocytes,Ua: NEGATIVE
Nitrite: NEGATIVE
Protein, ur: NEGATIVE
SPECIFIC GRAVITY, URINE: 1.008 (ref 1.001–1.03)
pH: 6.5 (ref 5.0–8.0)

## 2019-01-28 LAB — URINE CULTURE
MICRO NUMBER:: 357911
SPECIMEN QUALITY:: ADEQUATE

## 2019-01-28 LAB — VITAMIN B12: Vitamin B-12: 1265 pg/mL — ABNORMAL HIGH (ref 200–1100)

## 2019-01-28 LAB — FERRITIN: Ferritin: 135 ng/mL (ref 16–288)

## 2019-01-28 LAB — IRON, TOTAL/TOTAL IRON BINDING CAP
%SAT: 21 % (ref 16–45)
Iron: 44 ug/dL — ABNORMAL LOW (ref 45–160)
TIBC: 211 ug/dL — AB (ref 250–450)

## 2019-02-01 DIAGNOSIS — Z48815 Encounter for surgical aftercare following surgery on the digestive system: Secondary | ICD-10-CM | POA: Diagnosis not present

## 2019-02-01 DIAGNOSIS — Z9049 Acquired absence of other specified parts of digestive tract: Secondary | ICD-10-CM | POA: Diagnosis not present

## 2019-02-02 ENCOUNTER — Ambulatory Visit: Payer: Self-pay

## 2019-02-03 ENCOUNTER — Telehealth: Payer: Medicare Other | Admitting: Physician Assistant

## 2019-02-03 DIAGNOSIS — Z48815 Encounter for surgical aftercare following surgery on the digestive system: Secondary | ICD-10-CM | POA: Diagnosis not present

## 2019-02-03 DIAGNOSIS — Z9049 Acquired absence of other specified parts of digestive tract: Secondary | ICD-10-CM | POA: Diagnosis not present

## 2019-02-03 DIAGNOSIS — R197 Diarrhea, unspecified: Secondary | ICD-10-CM | POA: Diagnosis not present

## 2019-02-03 MED ORDER — COLESTIPOL HCL 1 G PO TABS: 1.0000 g | ORAL_TABLET | Freq: Every day | ORAL | 3 refills | Status: DC

## 2019-02-03 NOTE — Telephone Encounter (Signed)
She had 3 watery BM's this AM, she has shaking but no temperature. She has taken 3 imodium.  Stop the iron pills.   She had BP 106/76, no jaundice, temp is 98.7 via home health nurse that came by today.   Come in tomorrow to get the tests for Cdiff tomorrow before noon. If she continue to have diarrhea we will check for Cdiff and likely treat with vancomycin.   Stop the iron pill Will send in Colestid 1g to take 1 pill a day for diarrhea.

## 2019-02-03 NOTE — Addendum Note (Signed)
Addended by: Quentin Mulling R on: 02/03/2019 02:23 PM   Modules accepted: Level of Service

## 2019-02-03 NOTE — Addendum Note (Signed)
Addended by: Quentin Mulling R on: 02/03/2019 02:05 PM   Modules accepted: Orders

## 2019-02-03 NOTE — Telephone Encounter (Signed)
-----   Message from Gregery Na, CMA sent at 02/03/2019  9:30 AM EDT ----- Regarding: Diarrhea Contact: [phone] [name] patient's daughter on HIPAA                            Patient's mother is having a lot of diarrhea & would like a call back from the provider.  Please call WANDA per yellow note

## 2019-02-04 ENCOUNTER — Other Ambulatory Visit: Payer: Self-pay | Admitting: Internal Medicine

## 2019-02-04 NOTE — Telephone Encounter (Signed)
I got that VOLTERN gel approved on 3.26.2020

## 2019-02-05 ENCOUNTER — Other Ambulatory Visit: Payer: Self-pay | Admitting: Physician Assistant

## 2019-02-05 MED ORDER — VANCOMYCIN HCL 125 MG PO CAPS: 125.0000 mg | ORAL_CAPSULE | Freq: Four times a day (QID) | ORAL | 0 refills | Status: DC

## 2019-02-05 NOTE — Telephone Encounter (Signed)
Daughter called stating patient has had 3 episdoes of diarrhea before 3 AM, very foul odor. She will collect the stool for Cdiff to bring I Monday. Will send in oral vancomycin. Start on restora samples given.   No fever, severe AB pain. Patient is still drinking fluids, following bland diet.   Call the office or go to the ER if bloody stools, persistent diarrhea, not peeing regularly, unable to take oral fluids, vomiting, high fever, severe weakness, abdominal pain or failure to improve in 3 days.

## 2019-02-05 NOTE — Addendum Note (Signed)
Addended by: Quentin Mulling R on: 02/05/2019 09:32 AM   Modules accepted: Orders

## 2019-02-08 ENCOUNTER — Other Ambulatory Visit: Payer: Medicare Other

## 2019-02-08 ENCOUNTER — Other Ambulatory Visit: Payer: Self-pay

## 2019-02-08 DIAGNOSIS — R197 Diarrhea, unspecified: Secondary | ICD-10-CM

## 2019-02-08 DIAGNOSIS — Z48815 Encounter for surgical aftercare following surgery on the digestive system: Secondary | ICD-10-CM | POA: Diagnosis not present

## 2019-02-08 DIAGNOSIS — Z9049 Acquired absence of other specified parts of digestive tract: Secondary | ICD-10-CM | POA: Diagnosis not present

## 2019-02-09 DIAGNOSIS — Z9049 Acquired absence of other specified parts of digestive tract: Secondary | ICD-10-CM | POA: Diagnosis not present

## 2019-02-09 DIAGNOSIS — Z48815 Encounter for surgical aftercare following surgery on the digestive system: Secondary | ICD-10-CM | POA: Diagnosis not present

## 2019-02-09 LAB — CLOSTRIDIUM DIFFICILE TOXIN B, QUALITATIVE, REAL-TIME PCR: Toxigenic C. Difficile by PCR: NOT DETECTED

## 2019-02-15 DIAGNOSIS — Z48815 Encounter for surgical aftercare following surgery on the digestive system: Secondary | ICD-10-CM | POA: Diagnosis not present

## 2019-02-15 DIAGNOSIS — Z9049 Acquired absence of other specified parts of digestive tract: Secondary | ICD-10-CM | POA: Diagnosis not present

## 2019-02-17 ENCOUNTER — Other Ambulatory Visit: Payer: Self-pay | Admitting: Physician Assistant

## 2019-02-17 DIAGNOSIS — M170 Bilateral primary osteoarthritis of knee: Secondary | ICD-10-CM

## 2019-02-17 DIAGNOSIS — Z9049 Acquired absence of other specified parts of digestive tract: Secondary | ICD-10-CM | POA: Diagnosis not present

## 2019-02-17 DIAGNOSIS — Z48815 Encounter for surgical aftercare following surgery on the digestive system: Secondary | ICD-10-CM | POA: Diagnosis not present

## 2019-02-17 MED ORDER — RESTORA PO CAPS: ORAL_CAPSULE | ORAL | 2 refills | Status: DC

## 2019-02-20 DIAGNOSIS — Z48815 Encounter for surgical aftercare following surgery on the digestive system: Secondary | ICD-10-CM | POA: Diagnosis not present

## 2019-02-20 DIAGNOSIS — Z9049 Acquired absence of other specified parts of digestive tract: Secondary | ICD-10-CM | POA: Diagnosis not present

## 2019-02-23 ENCOUNTER — Other Ambulatory Visit: Payer: Self-pay

## 2019-02-23 ENCOUNTER — Ambulatory Visit (INDEPENDENT_AMBULATORY_CARE_PROVIDER_SITE_OTHER): Payer: Medicare Other

## 2019-02-23 VITALS — BP 122/74 | HR 100 | Temp 97.5°F | Ht 63.5 in | Wt 105.0 lb

## 2019-02-23 DIAGNOSIS — D508 Other iron deficiency anemias: Secondary | ICD-10-CM | POA: Diagnosis not present

## 2019-02-23 DIAGNOSIS — E538 Deficiency of other specified B group vitamins: Secondary | ICD-10-CM

## 2019-02-23 DIAGNOSIS — Z9049 Acquired absence of other specified parts of digestive tract: Secondary | ICD-10-CM | POA: Diagnosis not present

## 2019-02-23 DIAGNOSIS — Z48815 Encounter for surgical aftercare following surgery on the digestive system: Secondary | ICD-10-CM | POA: Diagnosis not present

## 2019-02-23 MED ORDER — CYANOCOBALAMIN 1000 MCG/ML IJ SOLN
1000.0000 ug | Freq: Once | INTRAMUSCULAR | Status: AC
  Administered 2019-02-23: 14:00:00 1000 ug via INTRAMUSCULAR

## 2019-02-23 NOTE — Progress Notes (Signed)
Patient here for a NV to get her B12 injection 0.5 ml [place] inLEFT deltoid.Pt reports no concerns at this time.  Vital signs were entered into Epic at the time of visit. Pt was given check out slip after injection

## 2019-02-24 ENCOUNTER — Ambulatory Visit: Payer: Medicare Other

## 2019-02-24 LAB — IRON,?TOTAL/TOTAL IRON BINDING CAP: TIBC: 206 mcg/dL (calc) — ABNORMAL LOW (ref 250–450)

## 2019-02-24 LAB — IRON, TOTAL/TOTAL IRON BINDING CAP
%SAT: 9 % (calc) — ABNORMAL LOW (ref 16–45)
Iron: 18 ug/dL — ABNORMAL LOW (ref 45–160)

## 2019-03-01 DIAGNOSIS — Z9049 Acquired absence of other specified parts of digestive tract: Secondary | ICD-10-CM | POA: Diagnosis not present

## 2019-03-01 DIAGNOSIS — Z48815 Encounter for surgical aftercare following surgery on the digestive system: Secondary | ICD-10-CM | POA: Diagnosis not present

## 2019-03-08 DIAGNOSIS — Z48815 Encounter for surgical aftercare following surgery on the digestive system: Secondary | ICD-10-CM | POA: Diagnosis not present

## 2019-03-08 DIAGNOSIS — Z9049 Acquired absence of other specified parts of digestive tract: Secondary | ICD-10-CM | POA: Diagnosis not present

## 2019-03-17 DIAGNOSIS — Z9049 Acquired absence of other specified parts of digestive tract: Secondary | ICD-10-CM | POA: Diagnosis not present

## 2019-03-17 DIAGNOSIS — Z48815 Encounter for surgical aftercare following surgery on the digestive system: Secondary | ICD-10-CM | POA: Diagnosis not present

## 2019-03-21 ENCOUNTER — Encounter (INDEPENDENT_AMBULATORY_CARE_PROVIDER_SITE_OTHER): Payer: Medicare Other | Admitting: Ophthalmology

## 2019-03-21 ENCOUNTER — Other Ambulatory Visit: Payer: Self-pay

## 2019-03-21 DIAGNOSIS — I10 Essential (primary) hypertension: Secondary | ICD-10-CM

## 2019-03-21 DIAGNOSIS — H353122 Nonexudative age-related macular degeneration, left eye, intermediate dry stage: Secondary | ICD-10-CM

## 2019-03-21 DIAGNOSIS — H43813 Vitreous degeneration, bilateral: Secondary | ICD-10-CM

## 2019-03-21 DIAGNOSIS — H34831 Tributary (branch) retinal vein occlusion, right eye, with macular edema: Secondary | ICD-10-CM

## 2019-03-21 DIAGNOSIS — H35033 Hypertensive retinopathy, bilateral: Secondary | ICD-10-CM

## 2019-03-21 NOTE — Progress Notes (Signed)
Subjective:    Patient ID: Shelby BishopSara W Rappaport, female    DOB: Mar 17, 1928, 85 y.o.   MRN: [id number]  HPI 85 y.o. WF presents with possible jaundice. However, the patient went to an eye doctor's appointment and got a new dye that the daughter found out later turned her eye and body yellow. She is fine today.   She underwent laparoscopic cholecystectomy on 01/18/2019  For acute cholecystitis, discharged on 01/20/2019.  Postoperatively, she has remained stable except for weakness and intermittent diarrhea. She was treated for possible Cdiff, started the oral vancomycin prior to testing the stool due to a miscommunication with the daughter however her diarrhea has resolved.  She remains on the restora, but is off colestid. She is back on iron.  Lab Results  Component Value Date   IRON 18 (L) 02/23/2019   TIBC 206 (L) 02/23/2019   FERRITIN 135 01/27/2019    Wt Readings from Last 4 Encounters:  03/22/19 107 lb 9.6 oz (48.8 kg)  02/23/19 105 lb (47.6 kg)  01/27/19 111 lb 9.6 oz (50.6 kg)  01/17/19 112 lb 7 oz (51 kg)   1/2 every day but 1 on Tues/Thurs. She is not on Biotin.  Lab Results  Component Value Date   TSH 3.59 01/05/2019   Blood pressure 120/76, pulse 65, temperature 97.7 F (36.5 C), temperature source Temporal, resp. rate 14, weight 107 lb 9.6 oz (48.8 kg), SpO2 97 %.  BP Readings from Last 3 Encounters:  03/22/19 120/76  02/23/19 122/74  01/27/19 (!) 148/70    Medications Current Outpatient Medications on File Prior to Visit  Medication Sig  . amitriptyline (ELAVIL) 50 MG tablet Take 0.5 tablets (25 mg total) by mouth at bedtime.  Marland Kitchen. amLODipine (NORVASC) 2.5 MG tablet TAKE 1 TABLET BY MOUTH EVERY DAY  . Cholecalciferol (VITAMIN D-3) 5000 UNITS TABS Take 5,000 Units by mouth daily.  . cyanocobalamin (,VITAMIN B-12,) 1000 MCG/ML injection INJECT 1 MILLILITER INTO THE SKIN EVERY 30 DAYS  . diclofenac sodium (VOLTAREN) 1 % GEL Apply 4 g topically 4 (four) times daily.  .  ferrous sulfate 325 (65 FE) MG tablet Take 325 mg by mouth daily with breakfast.  . levothyroxine (SYNTHROID, LEVOTHROID) 125 MCG tablet Take 0.5-1 tablets (62.5-125 mcg total) by mouth daily before breakfast. Taking 1 tablet on Tues, Thurs, all other days 1/2 tablet daily  . omeprazole (PRILOSEC) 40 MG capsule TAKE 1 CAPSULE BY MOUTH EVERY DAY  . ondansetron (ZOFRAN) 4 MG tablet Take 1 tablet (4 mg total) by mouth every 6 (six) hours as needed for nausea.  . Probiotic Product (RESTORA) CAPS 1 pill daily   No current facility-administered medications on file prior to visit.     Problem list She has Hypertension; Hyperlipidemia, mixed; B12 deficiency anemia; Anemia; Gastroesophageal reflux disease; Vitamin D deficiency; Abnormal glucose; Medication management; Hypothyroidism; Macular degeneration; Lower back pain; Encounter for Medicare annual wellness exam; COPD (chronic obstructive pulmonary disease) (HCC); Malnutrition of mild degree (HCC); and Acute cholecystitis on their problem list.   Review of Systems     Objective:   Physical Exam General appearance: alert, no distress, fraile/thin appearing  female HEENT: Left eye ptosis, normocephalic, sclerae anicteric, TM pearly on left, right TM white/scarred or absent TM, nares patent, no discharge or erythema, pharynx normal Oral cavity: MMM, no lesions, Slight depapillation of tongue, smooth, slightly erythematous but no obvious oral lesions Neck: supple, no lymphadenopathy, no thyromegaly, no masses Heart: RRR, normal S1, S2, no murmurs Lungs: CTA  bilaterally, no wheezes, rhonchi, or rales Abdomen: +bs, soft, with well healing scars, non distended, nontender, no masses, no hepatomegaly, no splenomegaly Musculoskeletal: nontender, no swelling, no obvious deformity, mild- moderate cervical kyphosis, walks with walker Extremities: no edema, no cyanosis, no clubbing Pulses: 2+ symmetric, upper and lower extremities, normal cap  refill Neurological: alert, oriented x 3, CN2-12 intact, strength normal upper extremities and lower extremities throughout, DTRs 2+ throughout, no cerebellar signs, gait normal Psychiatric: normal affect, behavior normal, pleasant        Assessment & Plan:   Other iron deficiency anemia -     Ferritin -     Iron,Total/Total Iron Binding Cap Continue iron, will check labs  Malnutrition of mild degree (HCC) -     CBC with Differential/Platelet -     COMPLETE METABOLIC PANEL WITH GFR - continue ensure, weight is improving - diarrhea has resolved  Hypothyroidism, unspecified type -     TSH  Anxiety -     ALPRAZolam (XANAX) 0.25 MG tablet; Take 1 tablet at hour of sleep ONLY if needed  No yellowing, patient and daughter reassured. Will get labs. Patient is doing very well for 89 and post surgery.

## 2019-03-22 ENCOUNTER — Ambulatory Visit (INDEPENDENT_AMBULATORY_CARE_PROVIDER_SITE_OTHER): Payer: Medicare Other | Admitting: Physician Assistant

## 2019-03-22 ENCOUNTER — Other Ambulatory Visit: Payer: Self-pay

## 2019-03-22 ENCOUNTER — Encounter: Payer: Self-pay | Admitting: Physician Assistant

## 2019-03-22 VITALS — BP 120/76 | HR 65 | Temp 97.7°F | Resp 14 | Wt 107.6 lb

## 2019-03-22 DIAGNOSIS — E441 Mild protein-calorie malnutrition: Secondary | ICD-10-CM

## 2019-03-22 DIAGNOSIS — D508 Other iron deficiency anemias: Secondary | ICD-10-CM | POA: Diagnosis not present

## 2019-03-22 DIAGNOSIS — E039 Hypothyroidism, unspecified: Secondary | ICD-10-CM

## 2019-03-22 MED ORDER — ALPRAZOLAM 0.25 MG PO TABS: ORAL_TABLET | ORAL | 0 refills | Status: DC

## 2019-03-23 LAB — CBC WITH DIFFERENTIAL/PLATELET
Absolute Monocytes: 360 cells/uL (ref 200–950)
Basophils Absolute: 20 cells/uL (ref 0–200)
Basophils Relative: 0.5 %
Eosinophils Absolute: 80 cells/uL (ref 15–500)
Eosinophils Relative: 2 %
HCT: 29.2 % — ABNORMAL LOW (ref 35.0–45.0)
Hemoglobin: 9.4 g/dL — ABNORMAL LOW (ref 11.7–15.5)
Lymphs Abs: 1124 cells/uL (ref 850–3900)
MCH: 28.7 pg (ref 27.0–33.0)
MCHC: 32.2 g/dL (ref 32.0–36.0)
MCV: 89.3 fL (ref 80.0–100.0)
MPV: 10.1 fL (ref 7.5–12.5)
Monocytes Relative: 9 %
Neutro Abs: 2416 cells/uL (ref 1500–7800)
Neutrophils Relative %: 60.4 %
Platelets: 216 10*3/uL (ref 140–400)
RBC: 3.27 10*6/uL — ABNORMAL LOW (ref 3.80–5.10)
RDW: 14.8 % (ref 11.0–15.0)
Total Lymphocyte: 28.1 %
WBC: 4 10*3/uL (ref 3.8–10.8)

## 2019-03-23 LAB — COMPLETE METABOLIC PANEL WITH GFR
AG Ratio: 1.4 (calc) (ref 1.0–2.5)
ALT: 17 U/L (ref 6–29)
AST: 26 U/L (ref 10–35)
Albumin: 3.6 g/dL (ref 3.6–5.1)
Alkaline phosphatase (APISO): 71 U/L (ref 37–153)
BUN/Creatinine Ratio: 23 (calc) — ABNORMAL HIGH (ref 6–22)
BUN: 21 mg/dL (ref 7–25)
CO2: 25 mmol/L (ref 20–32)
Calcium: 9.2 mg/dL (ref 8.6–10.4)
Chloride: 108 mmol/L (ref 98–110)
Creat: 0.92 mg/dL — ABNORMAL HIGH (ref 0.60–0.88)
GFR, Est African American: 64 mL/min/{1.73_m2} (ref >=60)
GFR, Est Non African American: 55 mL/min/{1.73_m2} — ABNORMAL LOW (ref >=60)
Globulin: 2.6 g/dL (calc) (ref 1.9–3.7)
Glucose, Bld: 110 mg/dL — ABNORMAL HIGH (ref 65–99)
Potassium: 3.9 mmol/L (ref 3.5–5.3)
Sodium: 141 mmol/L (ref 135–146)
Total Bilirubin: 0.3 mg/dL (ref 0.2–1.2)
Total Protein: 6.2 g/dL (ref 6.1–8.1)

## 2019-03-23 LAB — TSH: TSH: 12.09 mIU/L — ABNORMAL HIGH (ref 0.40–4.50)

## 2019-03-23 LAB — IRON,?TOTAL/TOTAL IRON BINDING CAP: TIBC: 238 mcg/dL (calc) — ABNORMAL LOW (ref 250–450)

## 2019-03-23 LAB — IRON, TOTAL/TOTAL IRON BINDING CAP
%SAT: 15 % (calc) — ABNORMAL LOW (ref 16–45)
Iron: 35 ug/dL — ABNORMAL LOW (ref 45–160)

## 2019-03-23 LAB — FERRITIN: Ferritin: 36 ng/mL (ref 16–288)

## 2019-03-23 NOTE — Addendum Note (Signed)
Addended by: Doree Albee on: 03/23/2019 08:13 AM   Modules accepted: Orders

## 2019-03-23 NOTE — Progress Notes (Signed)
Spoke with daughter regarding patient's lab results. She understands all recommendations and changes in thyroid medication. Will have labs re-checked when she comes in on 04/25/19 for her B12 injection. Requested that these lab results be mailed to her. Nicki [place].

## 2019-03-27 ENCOUNTER — Other Ambulatory Visit: Payer: Self-pay | Admitting: Internal Medicine

## 2019-03-31 ENCOUNTER — Other Ambulatory Visit: Payer: Self-pay | Admitting: Physician Assistant

## 2019-03-31 NOTE — Progress Notes (Signed)
This encounter was created in error - please disregard.

## 2019-04-25 ENCOUNTER — Ambulatory Visit (INDEPENDENT_AMBULATORY_CARE_PROVIDER_SITE_OTHER): Payer: Medicare Other | Admitting: *Deleted

## 2019-04-25 ENCOUNTER — Other Ambulatory Visit: Payer: Self-pay

## 2019-04-25 VITALS — BP 146/88 | HR 84 | Temp 97.4°F | Resp 16 | Ht 63.5 in | Wt 108.4 lb

## 2019-04-25 DIAGNOSIS — D508 Other iron deficiency anemias: Secondary | ICD-10-CM

## 2019-04-25 DIAGNOSIS — E441 Mild protein-calorie malnutrition: Secondary | ICD-10-CM

## 2019-04-25 DIAGNOSIS — E039 Hypothyroidism, unspecified: Secondary | ICD-10-CM | POA: Diagnosis not present

## 2019-04-25 DIAGNOSIS — E538 Deficiency of other specified B group vitamins: Secondary | ICD-10-CM | POA: Diagnosis not present

## 2019-04-25 MED ORDER — CYANOCOBALAMIN 1000 MCG/ML IJ SOLN
1000.0000 ug | Freq: Once | INTRAMUSCULAR | Status: AC
  Administered 2019-04-25: 1000 ug via INTRAMUSCULAR

## 2019-04-25 NOTE — Progress Notes (Signed)
Patient here for a NV to check labs and her Vitamin B12 injection.  She increased her Levothyroxine 125 mcg daily. She has increased her water intake and continue to take her Iron and Vitamin C. She received her B12 injection 1 ml IM in her left deltoid.

## 2019-04-26 LAB — CBC WITH DIFFERENTIAL/PLATELET
Absolute Monocytes: 333 cells/uL (ref 200–950)
Basophils Absolute: 18 cells/uL (ref 0–200)
Basophils Relative: 0.4 %
Eosinophils Absolute: 32 cells/uL (ref 15–500)
Eosinophils Relative: 0.7 %
HCT: 28.4 % — ABNORMAL LOW (ref 35.0–45.0)
Hemoglobin: 9.1 g/dL — ABNORMAL LOW (ref 11.7–15.5)
Lymphs Abs: 1310 cells/uL (ref 850–3900)
MCH: 29 pg (ref 27.0–33.0)
MCHC: 32 g/dL (ref 32.0–36.0)
MCV: 90.4 fL (ref 80.0–100.0)
MPV: 9.5 fL (ref 7.5–12.5)
Monocytes Relative: 7.4 %
Neutro Abs: 2808 cells/uL (ref 1500–7800)
Neutrophils Relative %: 62.4 %
Platelets: 186 10*3/uL (ref 140–400)
RBC: 3.14 10*6/uL — ABNORMAL LOW (ref 3.80–5.10)
RDW: 13.6 % (ref 11.0–15.0)
Total Lymphocyte: 29.1 %
WBC: 4.5 10*3/uL (ref 3.8–10.8)

## 2019-04-26 LAB — BASIC METABOLIC PANEL WITH GFR
BUN/Creatinine Ratio: 20 (calc) (ref 6–22)
BUN: 20 mg/dL (ref 7–25)
CO2: 24 mmol/L (ref 20–32)
Calcium: 9.2 mg/dL (ref 8.6–10.4)
Chloride: 105 mmol/L (ref 98–110)
Creat: 1.01 mg/dL — ABNORMAL HIGH (ref 0.60–0.88)
GFR, Est African American: 57 mL/min/{1.73_m2} — ABNORMAL LOW (ref >=60)
GFR, Est Non African American: 49 mL/min/{1.73_m2} — ABNORMAL LOW (ref >=60)
Glucose, Bld: 89 mg/dL (ref 65–99)
Potassium: 4 mmol/L (ref 3.5–5.3)
Sodium: 136 mmol/L (ref 135–146)

## 2019-04-26 LAB — TSH: TSH: 1.38 mIU/L (ref 0.40–4.50)

## 2019-04-26 LAB — IRON, TOTAL/TOTAL IRON BINDING CAP
%SAT: 24 % (calc) (ref 16–45)
Iron: 54 ug/dL (ref 45–160)
TIBC: 225 mcg/dL (calc) — ABNORMAL LOW (ref 250–450)

## 2019-04-26 NOTE — Progress Notes (Signed)
04/26/2019-spoke with patient's daughter Mariann Laster ball) and gave her the lab results on Mrs. Festus Barren

## 2019-04-28 ENCOUNTER — Other Ambulatory Visit: Payer: Self-pay | Admitting: Physician Assistant

## 2019-05-02 ENCOUNTER — Telehealth: Payer: Self-pay | Admitting: *Deleted

## 2019-05-02 NOTE — Telephone Encounter (Signed)
Last lab results mailed to patient, at her request.

## 2019-05-09 ENCOUNTER — Other Ambulatory Visit: Payer: Self-pay

## 2019-05-09 ENCOUNTER — Encounter: Payer: Self-pay | Admitting: Internal Medicine

## 2019-05-09 ENCOUNTER — Ambulatory Visit (INDEPENDENT_AMBULATORY_CARE_PROVIDER_SITE_OTHER): Payer: Medicare Other | Admitting: Internal Medicine

## 2019-05-09 VITALS — BP 168/66 | HR 80 | Temp 97.8°F | Resp 16 | Ht 63.5 in | Wt 104.8 lb

## 2019-05-09 DIAGNOSIS — E782 Mixed hyperlipidemia: Secondary | ICD-10-CM

## 2019-05-09 DIAGNOSIS — R7309 Other abnormal glucose: Secondary | ICD-10-CM

## 2019-05-09 DIAGNOSIS — E559 Vitamin D deficiency, unspecified: Secondary | ICD-10-CM

## 2019-05-09 DIAGNOSIS — E039 Hypothyroidism, unspecified: Secondary | ICD-10-CM

## 2019-05-09 DIAGNOSIS — I10 Essential (primary) hypertension: Secondary | ICD-10-CM | POA: Diagnosis not present

## 2019-05-09 DIAGNOSIS — E538 Deficiency of other specified B group vitamins: Secondary | ICD-10-CM | POA: Diagnosis not present

## 2019-05-09 MED ORDER — BISOPROLOL-HYDROCHLOROTHIAZIDE 5-6.25 MG PO TABS: ORAL_TABLET | ORAL | 3 refills | Status: DC

## 2019-05-09 NOTE — Patient Instructions (Signed)

## 2019-05-09 NOTE — Progress Notes (Signed)
History of Present Illness:     This very nice 85 y.o. [name] presents for 3 month follow up with HTN, HLD, Pre-Diabetes, Hypothyroidism, Vit B12 Deficiency, GERD and Vitamin D Deficiency. Patient was hospitalized in March with acute Cholecystitis and had lap Chole & has done well since. Her GERD is quiescent on current meds.       Patient is treated for HTN (1987) & BP has been controlled at home. Today's BP was elevated at 168/66 & re[eated x 2 at 164/66. Patient has had no complaints of any cardiac type chest pain, palpitations, dyspnea / orthopnea / PND, dizziness, claudication, or dependent edema.      Hyperlipidemia is controlled with diet. Last Lipids were at goal: Lab Results  Component Value Date   CHOL 153 01/05/2019   HDL 55 01/05/2019   LDLCALC 80 01/05/2019   TRIG 94 01/05/2019   CHOLHDL 2.8 01/05/2019      Also, the patient has history of PreDiabetes  (A1c 5.7% / 2016) and has had no symptoms of reactive hypoglycemia, diabetic polys, paresthesias or visual blurring.  Last A1c was Normal & at goal: Lab Results  Component Value Date   HGBA1C 5.5 05/04/2018      Patient has been on Thyroid Replacement since the 1990's with recent TSH in normal range.     Further, the patient also has history of Vitamin D Deficiency ("26" / 2016) and supplements vitamin D without any suspected side-effects. Last vitamin D was at goal: Lab Results  Component Value Date   VD25OH 68 05/04/2018   Current Outpatient Medications on File Prior to Visit  Medication Sig  . ALPRAZolam (XANAX) 0.25 MG tablet Take 1 tablet at hour of sleep ONLY if needed  . amitriptyline (ELAVIL) 50 MG tablet TAKE 1 TABLET BY MOUTH EVERYDAY AT BEDTIME  . amLODipine (NORVASC) 2.5 MG tablet TAKE 1 TABLET BY MOUTH EVERY DAY  . Cholecalciferol (VITAMIN D-3) 5000 UNITS TABS Take 5,000 Units by mouth daily.  . cyanocobalamin (,VITAMIN B-12,) 1000 MCG/ML injection INJECT 1 MILLILITER INTO THE SKIN EVERY 30 DAYS  . ferrous  sulfate 325 (65 FE) MG tablet Take 325 mg by mouth daily with breakfast.  . levothyroxine (SYNTHROID) 125 MCG tablet Take 1 tablet daily on an empty stomach with only water for 30 minutes & no Antacid meds, Calcium or Magnesium for 4 hours & avoid Biotin  . omeprazole (PRILOSEC) 40 MG capsule TAKE 1 CAPSULE BY MOUTH EVERY DAY  . Probiotic Product (RESTORA) CAPS 1 pill daily  . vitamin C (ASCORBIC ACID) 500 MG tablet Take 500 mg by mouth daily.   No current facility-administered medications on file prior to visit.    Allergies  Allergen Reactions  . Amlodipine Swelling    Swelling/edema  . Norvasc [Amlodipine Besylate]   . Sulfa Antibiotics    PMHx:   Past Medical History:  Diagnosis Date  . Allergy   . Anemia   . Anxiety   . DDD (degenerative disc disease), lumbar   . Depression   . GERD (gastroesophageal reflux disease)   . Hyperlipidemia   . Hypertension   . OA (osteoarthritis) of knee    right knee  . Pre-diabetes   . Thyroid disease   . Vitamin D deficiency    Immunization History  Administered Date(s) Administered  . DTaP 05/20/2012  . Influenza Whole 07/21/2013  . Influenza, High Dose Seasonal PF 06/29/2014, 07/30/2015, 07/10/2016, 09/07/2017, 08/10/2018  . Pneumococcal Conjugate-13 05/29/2014  . Pneumococcal Polysaccharide-23 05/07/2010,  12/05/2014  . Tdap 05/20/2012  . Zoster 11/03/2006   Past Surgical History:  Procedure Laterality Date  . CATARACT EXTRACTION     both  . CHOLECYSTECTOMY  01/18/2019  . CHOLECYSTECTOMY N/A 01/18/2019   Procedure: LAPAROSCOPIC CHOLECYSTECTOMY WITH INTRAOPERATIVE CHOLANGIOGRAM;  Surgeon: Berna Bueonnor, Chelsea A, MD;  Location: [facility] OR;  Service: General;  Laterality: N/A;  . KNEE ARTHROSCOPY Left    FHx:    Reviewed / unchanged  SHx:    Reviewed / unchanged   Systems Review:  Constitutional: Denies fever, chills, wt changes, headaches, insomnia, fatigue, night sweats, change in appetite. Eyes: Denies redness, blurred vision,  diplopia, discharge, itchy, watery eyes.  ENT: Denies discharge, congestion, post nasal drip, epistaxis, sore throat, earache, hearing loss, dental pain, tinnitus, vertigo, sinus pain, snoring.  CV: Denies chest pain, palpitations, irregular heartbeat, syncope, dyspnea, diaphoresis, orthopnea, PND, claudication or edema. Respiratory: denies cough, dyspnea, DOE, pleurisy, hoarseness, laryngitis, wheezing.  Gastrointestinal: Denies dysphagia, odynophagia, heartburn, reflux, water brash, abdominal pain or cramps, nausea, vomiting, bloating, diarrhea, constipation, hematemesis, melena, hematochezia  or hemorrhoids. Genitourinary: Denies dysuria, frequency, urgency, nocturia, hesitancy, discharge, hematuria or flank pain. Musculoskeletal: Denies arthralgias, myalgias, stiffness, jt. swelling, pain, limping or strain/sprain.  Skin: Denies pruritus, rash, hives, warts, acne, eczema or change in skin lesion(s). Neuro: No weakness, tremor, incoordination, spasms, paresthesia or pain. Psychiatric: Denies confusion, memory loss or sensory loss. Endo: Denies change in weight, skin or hair change.  Heme/Lymph: No excessive bleeding, bruising or enlarged lymph nodes.  Physical Exam  BP (!) 168/66   Pulse 80   Temp 97.8 F (36.6 C)   Resp 16   Ht 5' 3.5" (1.613 m)   Wt 104 lb 12.8 oz (47.5 kg)   BMI 18.27 kg/m   Appears  well nourished, well groomed  and in no distress.  Eyes: PERRLA, EOMs, conjunctiva no swelling or erythema. Sinuses: No frontal/maxillary tenderness ENT/Mouth: EAC's clear, TM's nl w/o erythema, bulging. Nares clear w/o erythema, swelling, exudates. Oropharynx clear without erythema or exudates. Oral hygiene is good. Tongue normal, non obstructing. Hearing intact.  Neck: Supple. Thyroid not palpable. Car 2+/2+ without bruits, nodes or JVD. Chest: Respirations nl with BS clear & equal w/o rales, rhonchi, wheezing or stridor.  Cor: Heart sounds normal w/ regular rate and rhythm  without sig. murmurs, gallops, clicks or rubs. Peripheral pulses normal and equal  without edema.  Abdomen: Soft & bowel sounds normal. Non-tender w/o guarding, rebound, hernias, masses or organomegaly.  Lymphatics: Unremarkable.  Musculoskeletal: Full ROM all peripheral extremities, joint stability, 5/5 strength and normal gait.  Skin: Warm, dry without exposed rashes, lesions or ecchymosis apparent.  Neuro: Cranial nerves intact, reflexes equal bilaterally. Sensory-motor testing grossly intact. Tendon reflexes grossly intact.  Pysch: Alert & oriented x 3.  Insight and judgement nl & appropriate. No ideations.  Assessment and Plan:  1. Essential hypertension  - Add Rx -bisoprolol-hydrochlorothiazide (ZIAC) 5-6.25 MG tablet; Take 1 tablet Daily with Breakfast for BP  Dispense: 90 tablet; Refill: 3  - Continue medication, monitor blood pressure at home.  - Continue DASH diet.  Reminder to go to the ER if any CP,  SOB, nausea, dizziness, severe HA, changes vision/speech.  2. Hyperlipidemia, mixed  - Continue diet/meds, exercise,& lifestyle modifications.  - Continue monitor periodic cholesterol/liver & renal functions   3. Abnormal glucose  - Continue diet, exercise  - Lifestyle modifications.  - Monitor appropriate labs.  4. Vitamin D deficiency  - Continue supplementation.  5. Hypothyroidism  6.  Vitamin B 12 deficiency       Discussed  regular exercise, BP monitoring, weight control to achieve/maintain BMI less than 25 and discussed med and SE's. Recommended labs to assess and monitor clinical status with further disposition pending results of labs.  I discussed the assessment and treatment plan with the patient. The patient was provided an opportunity to ask questions and all were answered. The patient agreed with the plan and demonstrated an understanding of the instructions.  I provided over 30 minutes of exam, counseling, chart review and  complex critical decision making.          The patient was advised to call back or seek an in-person evaluation if the symptoms worsen or if the condition fails to improve as anticipated.   Kirtland Bouchard, MD

## 2019-05-25 ENCOUNTER — Ambulatory Visit: Payer: Medicare Other

## 2019-06-15 ENCOUNTER — Ambulatory Visit (INDEPENDENT_AMBULATORY_CARE_PROVIDER_SITE_OTHER): Payer: Medicare Other | Admitting: Internal Medicine

## 2019-06-15 ENCOUNTER — Other Ambulatory Visit: Payer: Self-pay

## 2019-06-15 VITALS — BP 136/84 | HR 64 | Temp 97.6°F | Resp 16 | Ht 63.5 in | Wt 104.6 lb

## 2019-06-15 DIAGNOSIS — E559 Vitamin D deficiency, unspecified: Secondary | ICD-10-CM | POA: Diagnosis not present

## 2019-06-15 DIAGNOSIS — Z79899 Other long term (current) drug therapy: Secondary | ICD-10-CM | POA: Diagnosis not present

## 2019-06-15 DIAGNOSIS — N183 Chronic kidney disease, stage 3 unspecified: Secondary | ICD-10-CM

## 2019-06-15 DIAGNOSIS — E039 Hypothyroidism, unspecified: Secondary | ICD-10-CM | POA: Diagnosis not present

## 2019-06-15 DIAGNOSIS — I10 Essential (primary) hypertension: Secondary | ICD-10-CM

## 2019-06-15 DIAGNOSIS — D631 Anemia in chronic kidney disease: Secondary | ICD-10-CM

## 2019-06-15 MED ORDER — CYANOCOBALAMIN 1000 MCG/ML IJ SOLN
1000.0000 ug | Freq: Once | INTRAMUSCULAR | Status: AC
  Administered 2019-06-15: 16:00:00 1000 ug via INTRAMUSCULAR

## 2019-06-15 NOTE — Progress Notes (Signed)
Subjective:    Patient ID: Shelby BishopSara W Bradley, female    DOB: [birth year], 85 y.o.   MRN: [id number]  HPI      This very nice 85 yo [name] with  HTN, HLD, Pre-Diabetes, Hypothyroidism, Vit B12 Deficiency, GERD, CKD3 , Ch Anemia  and Vitamin D Deficiency returns for short 1 mo f/u after adding Ziac 5 for elevated BP 168/66. Since then home BPS have been   Outpatient Medications Prior to Visit  Medication Sig Dispense Refill  . ALPRAZolam (XANAX) 0.25 MG tablet Take 1 tablet at hour of sleep ONLY if needed 30 tablet 0  . amitriptyline (ELAVIL) 50 MG tablet TAKE 1 TABLET BY MOUTH EVERYDAY AT BEDTIME 90 tablet 1  . amLODipine (NORVASC) 2.5 MG tablet TAKE 1 TABLET BY MOUTH EVERY DAY 90 tablet 2  . bisoprolol-hydrochlorothiazide (ZIAC) 5-6.25 MG tablet Take 1 tablet Daily with Breakfast for BP 90 tablet 3  . Cholecalciferol (VITAMIN D-3) 5000 UNITS TABS Take 5,000 Units by mouth daily.    . cyanocobalamin (,VITAMIN B-12,) 1000 MCG/ML injection INJECT 1 MILLILITER INTO THE SKIN EVERY 30 DAYS 3 mL 2  . ferrous sulfate 325 (65 FE) MG tablet Take 325 mg by mouth daily with breakfast.    . levothyroxine (SYNTHROID) 125 MCG tablet Take 1 tablet daily on an empty stomach with only water for 30 minutes & no Antacid meds, Calcium or Magnesium for 4 hours & avoid Biotin 90 tablet 3  . omeprazole (PRILOSEC) 40 MG capsule TAKE 1 CAPSULE BY MOUTH EVERY DAY 90 capsule 1  . Probiotic Product (RESTORA) CAPS 1 pill daily 90 capsule 2  . vitamin C (ASCORBIC ACID) 500 MG tablet Take 500 mg by mouth daily.     No facility-administered medications prior to visit.    Past Medical History:  Diagnosis Date  . Allergy   . Anemia   . Anxiety   . DDD (degenerative disc disease), lumbar   . Depression   . GERD (gastroesophageal reflux disease)   . Hyperlipidemia   . Hypertension   . OA (osteoarthritis) of knee    right knee  . Pre-diabetes   . Thyroid disease   . Vitamin D deficiency    Past Surgical History:   Procedure Laterality Date  . CATARACT EXTRACTION     both  . CHOLECYSTECTOMY  01/18/2019  . CHOLECYSTECTOMY N/A 01/18/2019   Procedure: LAPAROSCOPIC CHOLECYSTECTOMY WITH INTRAOPERATIVE CHOLANGIOGRAM;  Surgeon: Berna Bueonnor, Chelsea A, MD;  Location: [facility] OR;  Service: General;  Laterality: N/A;  . KNEE ARTHROSCOPY Left    Review of Systems   10 point systems review negative except as above.    Objective:   Physical Exam  BP 136/84   Pulse 64   Temp 97.6 F (36.4 C)   Resp 16   Ht 5' 3.5" (1.613 m)   Wt 104 lb 9.6 oz (47.4 kg)   BMI 18.24 kg/m   HEENT - WNL. Neck - supple.  Chest - Clear equal BS. Cor - Nl HS. RRR w/o sig MGR. PP 1(+). No edema. MS- FROM w/o deformities.  Gait Nl. Neuro -  Nl w/o focal abnormalities.    Assessment & Plan:   1. Essential hypertension  2. CKD (chronic kidney disease) stage 3, GFR 30-59 ml/min (HCC)  - COMPLETE METABOLIC PANEL WITH GFR  3. Anemia due to stage 3 chronic kidney disease (HCC)  - CBC with Differential/Platelet  4. Medication management  - CBC with Differential/Platelet - COMPLETE METABOLIC PANEL WITH  GFR

## 2019-06-16 LAB — COMPLETE METABOLIC PANEL WITH GFR
AG Ratio: 1.5 (calc) (ref 1.0–2.5)
ALT: 19 U/L (ref 6–29)
AST: 29 U/L (ref 10–35)
Albumin: 3.8 g/dL (ref 3.6–5.1)
Alkaline phosphatase (APISO): 93 U/L (ref 37–153)
BUN/Creatinine Ratio: 22 (calc) (ref 6–22)
BUN: 28 mg/dL — ABNORMAL HIGH (ref 7–25)
CO2: 23 mmol/L (ref 20–32)
Calcium: 9.3 mg/dL (ref 8.6–10.4)
Chloride: 104 mmol/L (ref 98–110)
Creat: 1.26 mg/dL — ABNORMAL HIGH (ref 0.60–0.88)
GFR, Est African American: 44 mL/min/{1.73_m2} — ABNORMAL LOW (ref >=60)
GFR, Est Non African American: 38 mL/min/{1.73_m2} — ABNORMAL LOW (ref >=60)
Globulin: 2.5 g/dL (calc) (ref 1.9–3.7)
Glucose, Bld: 99 mg/dL (ref 65–99)
Potassium: 4.5 mmol/L (ref 3.5–5.3)
Sodium: 136 mmol/L (ref 135–146)
Total Bilirubin: 0.3 mg/dL (ref 0.2–1.2)
Total Protein: 6.3 g/dL (ref 6.1–8.1)

## 2019-06-16 LAB — CBC WITH DIFFERENTIAL/PLATELET
Absolute Monocytes: 420 cells/uL (ref 200–950)
Basophils Absolute: 30 cells/uL (ref 0–200)
Basophils Relative: 0.6 %
Eosinophils Absolute: 40 cells/uL (ref 15–500)
Eosinophils Relative: 0.8 %
HCT: 31.2 % — ABNORMAL LOW (ref 35.0–45.0)
Hemoglobin: 10.2 g/dL — ABNORMAL LOW (ref 11.7–15.5)
Lymphs Abs: 1515 cells/uL (ref 850–3900)
MCH: 29.3 pg (ref 27.0–33.0)
MCHC: 32.7 g/dL (ref 32.0–36.0)
MCV: 89.7 fL (ref 80.0–100.0)
MPV: 10.1 fL (ref 7.5–12.5)
Monocytes Relative: 8.4 %
Neutro Abs: 2995 cells/uL (ref 1500–7800)
Neutrophils Relative %: 59.9 %
Platelets: 197 10*3/uL (ref 140–400)
RBC: 3.48 10*6/uL — ABNORMAL LOW (ref 3.80–5.10)
RDW: 13.4 % (ref 11.0–15.0)
Total Lymphocyte: 30.3 %
WBC: 5 10*3/uL (ref 3.8–10.8)

## 2019-06-16 LAB — TSH: TSH: 2.29 mIU/L (ref 0.40–4.50)

## 2019-06-18 ENCOUNTER — Encounter: Payer: Self-pay | Admitting: Internal Medicine

## 2019-06-20 ENCOUNTER — Encounter: Payer: Self-pay | Admitting: *Deleted

## 2019-06-28 ENCOUNTER — Telehealth: Payer: Self-pay | Admitting: *Deleted

## 2019-06-28 NOTE — Telephone Encounter (Signed)
Patient's daughter, Mariann Laster, called and reported the patient's BP at 112/52.  She states the patient feels dizzy and asked if the Bisoprolol/HCTZ 5-6.25 could be cut in half.  The patient is afraid of falling, due to the dizziness.  Per Dr Melford Aase, it is OK to halve the medication and check her blood pressure.  A message was left to in [place].

## 2019-07-18 ENCOUNTER — Other Ambulatory Visit: Payer: Self-pay

## 2019-07-18 ENCOUNTER — Ambulatory Visit (INDEPENDENT_AMBULATORY_CARE_PROVIDER_SITE_OTHER): Payer: Medicare Other

## 2019-07-18 VITALS — BP 130/72 | HR 66 | Temp 97.3°F | Ht 63.5 in | Wt 104.0 lb

## 2019-07-18 DIAGNOSIS — E538 Deficiency of other specified B group vitamins: Secondary | ICD-10-CM

## 2019-07-18 DIAGNOSIS — Z79899 Other long term (current) drug therapy: Secondary | ICD-10-CM

## 2019-07-18 MED ORDER — CYANOCOBALAMIN 1000 MCG/ML IJ SOLN
1000.0000 ug | Freq: Once | INTRAMUSCULAR | Status: AC
  Administered 2019-07-18: 14:00:00 1000 ug via INTRAMUSCULAR

## 2019-07-18 NOTE — Progress Notes (Signed)
Patient presents for a NV B12 injection. She was given 106mL IM in her LEFT deltoid. Vitals entered into EPIC.f

## 2019-07-18 NOTE — Addendum Note (Signed)
Addended by: Elsie Amis D on: 07/18/2019 02:10 PM   Modules accepted: Orders

## 2019-08-01 ENCOUNTER — Other Ambulatory Visit: Payer: Self-pay

## 2019-08-01 ENCOUNTER — Encounter (INDEPENDENT_AMBULATORY_CARE_PROVIDER_SITE_OTHER): Payer: Medicare Other | Admitting: Ophthalmology

## 2019-08-01 DIAGNOSIS — H35033 Hypertensive retinopathy, bilateral: Secondary | ICD-10-CM | POA: Diagnosis not present

## 2019-08-01 DIAGNOSIS — H353122 Nonexudative age-related macular degeneration, left eye, intermediate dry stage: Secondary | ICD-10-CM

## 2019-08-01 DIAGNOSIS — I10 Essential (primary) hypertension: Secondary | ICD-10-CM

## 2019-08-01 DIAGNOSIS — H34831 Tributary (branch) retinal vein occlusion, right eye, with macular edema: Secondary | ICD-10-CM

## 2019-08-01 DIAGNOSIS — H43813 Vitreous degeneration, bilateral: Secondary | ICD-10-CM

## 2019-08-04 ENCOUNTER — Other Ambulatory Visit: Payer: Self-pay | Admitting: Internal Medicine

## 2019-08-10 NOTE — Progress Notes (Signed)
Order(s) created erroneously. Erroneous order ID: [id number]  Order canceled by: Alyson Ingles  Order cancel date/time: 08/10/2019 4:27 PM

## 2019-08-10 NOTE — Progress Notes (Signed)
Order(s) created erroneously. Erroneous order ID: [id number]  Order canceled by: Alyson Ingles  Order cancel date/time: 08/10/2019 4:33 PM

## 2019-08-10 NOTE — Progress Notes (Signed)
Order(s) created erroneously. Erroneous order ID: [id number]  Order canceled by: Alyson Ingles  Order cancel date/time: 08/10/2019 4:32 PM

## 2019-08-10 NOTE — Progress Notes (Signed)
Order(s) created erroneously. Erroneous order ID: [id number]  Order canceled by: Alyson Ingles  Order cancel date/time: 08/10/2019 4:36 PM

## 2019-08-10 NOTE — Progress Notes (Signed)
Order(s) created erroneously. Erroneous order ID: [id number]  Order canceled by: Alyson Ingles  Order cancel date/time: 08/10/2019 4:37 PM

## 2019-08-17 ENCOUNTER — Other Ambulatory Visit: Payer: Self-pay

## 2019-08-17 ENCOUNTER — Ambulatory Visit (INDEPENDENT_AMBULATORY_CARE_PROVIDER_SITE_OTHER): Payer: Medicare Other

## 2019-08-17 VITALS — BP 138/80 | HR 68 | Temp 97.9°F | Wt 109.0 lb

## 2019-08-17 DIAGNOSIS — E538 Deficiency of other specified B group vitamins: Secondary | ICD-10-CM

## 2019-08-17 MED ORDER — CYANOCOBALAMIN 1000 MCG/ML IJ SOLN
1000.0000 ug | Freq: Once | INTRAMUSCULAR | Status: AC
  Administered 2019-08-17: 1000 ug via INTRAMUSCULAR

## 2019-08-17 NOTE — Progress Notes (Signed)
Patient presents to the office for B12 injection, 83mL administered in right deltoid. No questions or concerns. Vitals taken and recorded.

## 2019-08-20 ENCOUNTER — Other Ambulatory Visit: Payer: Self-pay | Admitting: Adult Health

## 2019-08-20 ENCOUNTER — Other Ambulatory Visit: Payer: Self-pay | Admitting: Internal Medicine

## 2019-08-24 ENCOUNTER — Other Ambulatory Visit: Payer: Self-pay

## 2019-08-24 ENCOUNTER — Ambulatory Visit (INDEPENDENT_AMBULATORY_CARE_PROVIDER_SITE_OTHER): Payer: Medicare Other

## 2019-08-24 VITALS — Temp 97.6°F | Wt 110.0 lb

## 2019-08-24 DIAGNOSIS — Z23 Encounter for immunization: Secondary | ICD-10-CM | POA: Diagnosis not present

## 2019-08-24 NOTE — Progress Notes (Signed)
REPORTS for HD FLU 

## 2019-08-31 ENCOUNTER — Encounter (INDEPENDENT_AMBULATORY_CARE_PROVIDER_SITE_OTHER): Payer: Medicare Other | Admitting: Ophthalmology

## 2019-09-06 ENCOUNTER — Encounter: Payer: Self-pay | Admitting: Internal Medicine

## 2019-09-12 ENCOUNTER — Encounter: Payer: Self-pay | Admitting: Physician Assistant

## 2019-09-12 DIAGNOSIS — N1831 Chronic kidney disease, stage 3a: Secondary | ICD-10-CM | POA: Insufficient documentation

## 2019-09-12 NOTE — Progress Notes (Signed)
MEDICARE ANNUAL WELLNESS VISIT AND CPE  Assessment:   Essential hypertension - continue medications, DASH diet, exercise and monitor at home. Call if greater than 130/80.  -     CBC with Differential/Platelet -     BASIC METABOLIC PANEL WITH GFR -     Hepatic function panel -     TSH -     EKG 12-Lead -     Urinalysis, Routine w reflex microscopic -     Microalbumin / creatinine urine ratio  Chronic obstructive pulmonary disease, unspecified COPD type (HCC) Avoid triggers, continue allergy pill  Mixed hyperlipidemia -decrease fatty foods, increase activity.  -     Lipid panel  Hypothyroidism, unspecified type Hypothyroidism-check TSH level, continue medications the same, reminded to take on an empty stomach 30-27mns before food.  -     TSH  Malnutrition of mild degree (HCC) Continue protein/weight gain/ensure -     Hepatic function panel  Other abnormal glucose Monitor  Medication management -     Magnesium  Anemia due to vitamin B12 deficiency, unspecified B12 deficiency type Continue injections  Vitamin D deficiency Continue supplement  Anemia, unspecified type Monitor  Macular degeneration, unspecified laterality, unspecified type Continue follow up Dr. MZigmund Daniel Gastroesophageal reflux disease, esophagitis presence not specified Continue PPI/H2 blocker, diet discussed  Encounter for Medicare annual wellness exam 1 year  BMI 19 Get on ensure, samples given  Over 40 minutes of exam, counseling, chart review and critical decision making was performed Future Appointments  Date Time Provider DTimberlake 10/13/2019  1:45 PM GAAM-GAAIM NURSE GAAM-GAAIM None  12/19/2019 12:30 PM MHayden Pedro MD TRE-TRE None     Plan:   During the course of the visit the patient was educated and counseled about appropriate screening and preventive services including:    Pneumococcal vaccine   Prevnar 13  Influenza vaccine  Td vaccine  Screening  electrocardiogram  Bone densitometry screening  Colorectal cancer screening  Diabetes screening  Glaucoma screening  Nutrition counseling   Advanced directives: requested   Subjective:  Shelby HERBERTis a 85y.o. female who presents for Medicare Annual Wellness Visit and complete physical.    BMI is Body mass index is 19.21 kg/m., she is working on diet and exercise, she is working on weight gain/being stable. She is on ensure.  Wt Readings from Last 3 Encounters:  09/13/19 110 lb 3.2 oz (50 kg)  08/24/19 110 lb (49.9 kg)  08/17/19 109 lb (49.4 kg)   Her blood pressure has been controlled at home, today their BP is BP: 126/60  She does not workout. She denies chest pain, shortness of breath, dizziness.  She is not on cholesterol medication and denies myalgias. Her cholesterol is at goal. The cholesterol last visit was:   Lab Results  Component Value Date   CHOL 153 01/05/2019   HDL 55 01/05/2019   L[place]80 01/05/2019   TRIG 94 01/05/2019   CHOLHDL 2.8 01/05/2019   She does not have diabetes, has CKD due to HTN and age.  Last GFR: Lab Results  Component Value Date   GFRNONAA 38 (L) 06/15/2019   Patient is on Vitamin D supplement.   Lab Results  Component Value Date   VD25OH 68 05/04/2018     She is on thyroid medication. Her medication was not changed last visit, 1 pill daily of the 1238m.  Lab Results  Component Value Date   TSH 2.29 06/15/2019  .  She has  chronic hearing loss in left ear from mastoid sx when she was younger, wears bilateral hearing aids.   Follows up with Dr. Katy Fitch regularly, and he sent her to Dr. Rodman Key for mac degeneration, she gets injections had one this past month, follows up Feb, she does still drive but does not drive at night.   She takes amitriptyline 1/2 pill at night for sleep.  She will take xanax 0.23m occ during the day for anxiety, will take 1/2 at least once a week.   Medication Review:  Current Outpatient  Medications (Endocrine & Metabolic):  .  levothyroxine (SYNTHROID) 125 MCG tablet, Take 1 tablet daily on an empty stomach with only water for 30 minutes & no Antacid meds, Calcium or Magnesium for 4 hours & avoid Biotin   Current Outpatient Medications (Cardiovascular):  .  amLODipine (NORVASC) 2.5 MG tablet, Take 1 tablet Daily for BP .  bisoprolol-hydrochlorothiazide (ZIAC) 5-6.25 MG tablet, Take 1 tablet Daily with Breakfast for BP       Current Outpatient Medications (Hematological):  .  cyanocobalamin (,VITAMIN B-12,) 1000 MCG/ML injection, INJECT 1 MILLILITER INTO THE SKIN EVERY 30 DAYS .  ferrous sulfate 325 (65 FE) MG tablet, Take 325 mg by mouth daily with breakfast.  Current Facility-Administered Medications (Hematological):  .  cyanocobalamin ((VITAMIN B-12)) injection 1,000 mcg  Current Outpatient Medications (Other):  .Marland Kitchen ALPRAZolam (XANAX) 0.25 MG tablet, Take 1 tablet at hour of sleep ONLY if needed .  amitriptyline (ELAVIL) 50 MG tablet, TAKE 1 TABLET BY MOUTH EVERYDAY AT BEDTIME .  Cholecalciferol (VITAMIN D-3) 5000 UNITS TABS, Take 5,000 Units by mouth daily. .Marland Kitchen omeprazole (PRILOSEC) 40 MG capsule, Take 1 capsule Daily to Prevent Indigestion & HeartburnT .  Probiotic Product (RESTORA) CAPS, 1 pill daily .  vitamin C (ASCORBIC ACID) 500 MG tablet, Take 500 mg by mouth daily.    Allergies  Allergen Reactions  . Norvasc [Amlodipine Besylate]   . Sulfa Antibiotics     Current Problems (verified) Patient Active Problem List   Diagnosis Date Noted  . Stage 3 chronic kidney disease 09/12/2019  . Malnutrition of mild degree ([place] 11/18/2016  . COPD (chronic obstructive pulmonary disease) ([place] 07/10/2016  . Hypothyroidism 06/28/2015  . Macular degeneration 06/28/2015  . Lower back pain 06/28/2015  . Medication management 12/05/2014  . Hypertension   . Hyperlipidemia, mixed   . B12 deficiency anemia   . Anemia   . Gastroesophageal reflux disease   . Vitamin  D deficiency   . Abnormal glucose     Screening Tests Immunization History  Administered Date(s) Administered  . DTaP 05/20/2012  . Influenza Whole 07/21/2013  . Influenza, High Dose Seasonal PF 06/29/2014, 07/30/2015, 07/10/2016, 09/07/2017, 08/10/2018, 08/24/2019  . Pneumococcal Conjugate-13 05/29/2014  . Pneumococcal Polysaccharide-23 05/07/2010, 12/05/2014  . Tdap 05/20/2012  . Zoster 11/03/2006   Preventative care: Last colonoscopy: 2010 will not get another Last mammogram: 2009 declines another Last pap smear/pelvic exam: remote DEXA: 2013 osteoporosis, declines another  Prior vaccinations: TD or Tdap: 2013  Influenza: 2020 Pneumococcal: 2016 Prevnar13: 2015 Shingles/Zostavax: 2008  Names of Other Physician/Practitioners you currently use: 1. [facility] here for primary care 2. Dr. GKaty Fitch eye doctor, once a year 3. DRonnell [place] dentist, once a year 4. Dr. MZigmund Daniel treated MD, June 15 2017 Patient Care Team: MUnk Pinto MD as PCP - General (Internal Medicine) ELaurence Spates MD as Consulting Physician (Gastroenterology) GBerenice Primas MD as Referring Physician (Orthopedic Surgery) GWarden Fillers  MD as Consulting Physician (Optometry)  SURGICAL HISTORY She  has a past surgical history that includes Knee arthroscopy (Left); Cataract extraction; Cholecystectomy (01/18/2019); and Cholecystectomy (N/A, 01/18/2019). FAMILY HISTORY Her family history includes COPD in her father; Heart attack in her brother and brother; Hypertension in her mother. SOCIAL HISTORY She  reports that she has never smoked. She has never used smokeless tobacco. She reports that she does not drink alcohol or use drugs.   MEDICARE WELLNESS OBJECTIVES: Physical activity: Current Exercise Habits: The patient does not participate in regular exercise at present(does house work and yard work) Cardiac risk factors: Cardiac Risk Factors include:  advanced age (>92mn, >>37women);dyslipidemia;hypertension;sedentary lifestyle Depression/mood screen:   Depression screen [facility]/9 09/13/2019  Decreased Interest 0  Down, Depressed, Hopeless 0  PHQ - 2 Score 0    ADLs:  In your present state of health, do you have any difficulty performing the following activities: 09/13/2019 06/18/2019  Hearing? Y N  Comment wears bilateral hearing aids -  Vision? N N  Difficulty concentrating or making decisions? N N  Walking or climbing stairs? N N  Dressing or bathing? N N  Doing errands, shopping? N N  Comment she still drives but not at night -  Preparing Food and eating ? N -  Using the Toilet? N -  In the past six months, have you accidently leaked urine? N -  Do you have problems with loss of bowel control? N -  Managing your Medications? N -  Managing your Finances? N -  Housekeeping or managing your Housekeeping? N -  Some recent data might be hidden    Cognitive Testing  Alert? Yes  Normal Appearance?Yes  Oriented to person? Yes  Place? Yes   Time? Yes  Recall of three objects?  2/3  Can perform simple calculations? Yes  Displays appropriate judgment?Yes  Can read the correct time from a watch face?Yes  EOL planning: Does Patient Have a Medical Advance Directive?: Yes Type of Advance Directive: Healthcare Power of Attorney, Living will Does patient want to make changes to medical advance directive?: No - Patient declined Copy of [place]?: No - copy requested  Review of Systems  Constitutional: Negative for chills, fever and malaise/fatigue.  HENT: Negative for congestion, ear pain and sore throat.   Eyes: Negative.   Respiratory: Negative for cough, shortness of breath and wheezing.   Cardiovascular: Negative for chest pain, palpitations and leg swelling.  Gastrointestinal: Negative for abdominal pain, blood in stool, constipation, diarrhea, heartburn and melena.  Genitourinary: Negative.    Skin: Negative.   Neurological: Negative for dizziness, sensory change, loss of consciousness and headaches.  Psychiatric/Behavioral: Negative for depression. The patient is not nervous/anxious and does not have insomnia.      Objective:     Today's Vitals   09/13/19 1142  BP: 126/60  Pulse: 69  Temp: 97.7 F (36.5 C)  SpO2: 97%  Weight: 110 lb 3.2 oz (50 kg)  Height: 5' 3.5" (1.613 m)  PainSc: 0-No pain   Body mass index is 19.21 kg/m.  General appearance: alert, no distress, WD/WN,  female HEENT: Left eye ptosis, normocephalic, sclerae anicteric, TM pearly on left, right TM white/scarred or absent TM, nares patent, no discharge or erythema, pharynx normal Oral cavity: MMM, no lesions Neck: supple, no lymphadenopathy, no thyromegaly, no masses Heart: RRR, normal S1, S2, no murmurs Lungs: CTA bilaterally, no wheezes, rhonchi, or rales Abdomen: +bs, soft, non tender, non distended,  no masses, no hepatomegaly, no splenomegaly Musculoskeletal: nontender, no swelling, no obvious deformity, mild- moderate cervical kyphosis Extremities: no edema, no cyanosis, no clubbing Pulses: 2+ symmetric, upper and lower extremities, normal cap refill Neurological: alert, oriented x 3, CN2-12 intact, strength normal upper extremities and lower extremities, sensation normal throughout, DTRs 2+ throughout, no cerebellar signs, gait normal Psychiatric: normal affect, behavior normal, pleasant  Breast: nontender, no masses or lumps, no skin changes, no nipple discharge or inversion, no axillary lymphadenopathy  Medicare Attestation I have personally reviewed: The patient's medical and social history Their use of alcohol, tobacco or illicit drugs Their current medications and supplements The patient's functional ability including ADLs,fall risks, home safety risks, cognitive, and hearing and visual impairment Diet and physical activities Evidence for depression or mood disorders  The patient's  weight, height, BMI, and visual acuity have been recorded in the chart.  I have made referrals, counseling, and provided education to the patient based on review of the above and I have provided the patient with a written personalized care plan for preventive services.     Vicie Mutters, PA-C   09/13/2019

## 2019-09-13 ENCOUNTER — Other Ambulatory Visit: Payer: Self-pay

## 2019-09-13 ENCOUNTER — Encounter: Payer: Self-pay | Admitting: Physician Assistant

## 2019-09-13 ENCOUNTER — Ambulatory Visit (INDEPENDENT_AMBULATORY_CARE_PROVIDER_SITE_OTHER): Payer: Medicare Other | Admitting: Physician Assistant

## 2019-09-13 VITALS — BP 126/60 | HR 69 | Temp 97.7°F | Ht 63.5 in | Wt 110.2 lb

## 2019-09-13 DIAGNOSIS — D518 Other vitamin B12 deficiency anemias: Secondary | ICD-10-CM | POA: Diagnosis not present

## 2019-09-13 DIAGNOSIS — E559 Vitamin D deficiency, unspecified: Secondary | ICD-10-CM

## 2019-09-13 DIAGNOSIS — E441 Mild protein-calorie malnutrition: Secondary | ICD-10-CM | POA: Diagnosis not present

## 2019-09-13 DIAGNOSIS — K219 Gastro-esophageal reflux disease without esophagitis: Secondary | ICD-10-CM | POA: Diagnosis not present

## 2019-09-13 DIAGNOSIS — N183 Chronic kidney disease, stage 3 unspecified: Secondary | ICD-10-CM | POA: Diagnosis not present

## 2019-09-13 DIAGNOSIS — E039 Hypothyroidism, unspecified: Secondary | ICD-10-CM

## 2019-09-13 DIAGNOSIS — E782 Mixed hyperlipidemia: Secondary | ICD-10-CM | POA: Diagnosis not present

## 2019-09-13 DIAGNOSIS — R6889 Other general symptoms and signs: Secondary | ICD-10-CM | POA: Diagnosis not present

## 2019-09-13 DIAGNOSIS — H3530 Unspecified macular degeneration: Secondary | ICD-10-CM | POA: Diagnosis not present

## 2019-09-13 DIAGNOSIS — J449 Chronic obstructive pulmonary disease, unspecified: Secondary | ICD-10-CM | POA: Diagnosis not present

## 2019-09-13 DIAGNOSIS — D649 Anemia, unspecified: Secondary | ICD-10-CM | POA: Diagnosis not present

## 2019-09-13 DIAGNOSIS — Z79899 Other long term (current) drug therapy: Secondary | ICD-10-CM | POA: Diagnosis not present

## 2019-09-13 DIAGNOSIS — Z0000 Encounter for general adult medical examination without abnormal findings: Secondary | ICD-10-CM

## 2019-09-13 DIAGNOSIS — D508 Other iron deficiency anemias: Secondary | ICD-10-CM | POA: Diagnosis not present

## 2019-09-13 DIAGNOSIS — I10 Essential (primary) hypertension: Secondary | ICD-10-CM | POA: Diagnosis not present

## 2019-09-13 DIAGNOSIS — Z0001 Encounter for general adult medical examination with abnormal findings: Secondary | ICD-10-CM

## 2019-09-13 MED ORDER — CYANOCOBALAMIN 1000 MCG/ML IJ SOLN: 1000.0000 ug | Freq: Once | INTRAMUSCULAR | Status: DC

## 2019-09-13 NOTE — Patient Instructions (Addendum)
WEIGHT GAIN Continue ensure or boost daily Try to make sure you are eating plenty of high calorie dense foods like: Avocado Nuts Peanut butter Oatmeal Dates [place] yogurt Protein powder   GENERAL HEALTH GOALS  Know what a healthy weight is for you (roughly BMI <25) and aim to maintain this  Aim for 7+ servings of fruits and vegetables daily  70-80+ fluid ounces of water or unsweet tea for healthy kidneys  Limit to max 1 drink of alcohol per day; avoid smoking/tobacco  Limit animal fats in diet for cholesterol and heart health - choose grass fed whenever available  Avoid highly processed foods, and foods high in saturated/trans fats  Aim for low stress - take time to unwind and care for your mental health  Aim for 150 min of moderate intensity exercise weekly for heart health, and weights twice weekly for bone health  Aim for 7-9 hours of sleep daily

## 2019-09-14 LAB — COMPLETE METABOLIC PANEL WITH GFR
AG Ratio: 1.7 (calc) (ref 1.0–2.5)
ALT: 14 U/L (ref 6–29)
AST: 22 U/L (ref 10–35)
Albumin: 3.7 g/dL (ref 3.6–5.1)
Alkaline phosphatase (APISO): 96 U/L (ref 37–153)
BUN/Creatinine Ratio: 17 (calc) (ref 6–22)
BUN: 19 mg/dL (ref 7–25)
CO2: 24 mmol/L (ref 20–32)
Calcium: 9.3 mg/dL (ref 8.6–10.4)
Chloride: 105 mmol/L (ref 98–110)
Creat: 1.09 mg/dL — ABNORMAL HIGH (ref 0.60–0.88)
GFR, Est African American: 52 mL/min/{1.73_m2} — ABNORMAL LOW (ref >=60)
GFR, Est Non African American: 45 mL/min/{1.73_m2} — ABNORMAL LOW (ref >=60)
Globulin: 2.2 g/dL (calc) (ref 1.9–3.7)
Glucose, Bld: 93 mg/dL (ref 65–99)
Potassium: 5 mmol/L (ref 3.5–5.3)
Sodium: 136 mmol/L (ref 135–146)
Total Bilirubin: 0.4 mg/dL (ref 0.2–1.2)
Total Protein: 5.9 g/dL — ABNORMAL LOW (ref 6.1–8.1)

## 2019-09-14 LAB — IRON,TIBC AND FERRITIN PANEL
%SAT: 32 % (calc) (ref 16–45)
Ferritin: 75 ng/mL (ref 16–288)
Iron: 70 ug/dL (ref 45–160)
TIBC: 219 mcg/dL (calc) — ABNORMAL LOW (ref 250–450)

## 2019-09-14 LAB — LIPID PANEL
Cholesterol: 137 mg/dL (ref <200)
HDL: 51 mg/dL (ref >=50)
LDL Cholesterol (Calc): 70 mg/dL (calc)
Non-HDL Cholesterol (Calc): 86 mg/dL (calc) (ref <130)
Total CHOL/HDL Ratio: 2.7 (calc) (ref <5.0)
Triglycerides: 84 mg/dL (ref <150)

## 2019-09-14 LAB — CBC WITH DIFFERENTIAL/PLATELET
Absolute Monocytes: 408 cells/uL (ref 200–950)
Basophils Absolute: 29 cells/uL (ref 0–200)
Basophils Relative: 0.6 %
Eosinophils Absolute: 48 cells/uL (ref 15–500)
Eosinophils Relative: 1 %
HCT: 29.7 % — ABNORMAL LOW (ref 35.0–45.0)
Hemoglobin: 9.5 g/dL — ABNORMAL LOW (ref 11.7–15.5)
Lymphs Abs: 1301 cells/uL (ref 850–3900)
MCH: 30.2 pg (ref 27.0–33.0)
MCHC: 32 g/dL (ref 32.0–36.0)
MCV: 94.3 fL (ref 80.0–100.0)
MPV: 10 fL (ref 7.5–12.5)
Monocytes Relative: 8.5 %
Neutro Abs: 3014 cells/uL (ref 1500–7800)
Neutrophils Relative %: 62.8 %
Platelets: 192 10*3/uL (ref 140–400)
RBC: 3.15 10*6/uL — ABNORMAL LOW (ref 3.80–5.10)
RDW: 13.4 % (ref 11.0–15.0)
Total Lymphocyte: 27.1 %
WBC: 4.8 10*3/uL (ref 3.8–10.8)

## 2019-09-14 LAB — VITAMIN D 25 HYDROXY (VIT D DEFICIENCY, FRACTURES): Vit D, 25-Hydroxy: 63 ng/mL (ref 30–100)

## 2019-09-14 LAB — TEST AUTHORIZATION

## 2019-09-14 LAB — MAGNESIUM: Magnesium: 1.8 mg/dL (ref 1.5–2.5)

## 2019-09-14 LAB — TSH: TSH: 1.03 mIU/L (ref 0.40–4.50)

## 2019-09-14 NOTE — Addendum Note (Signed)
Addended by: Vicie Mutters R on: 09/14/2019 08:11 AM   Modules accepted: Orders

## 2019-10-12 ENCOUNTER — Other Ambulatory Visit: Payer: Self-pay

## 2019-10-12 ENCOUNTER — Ambulatory Visit (INDEPENDENT_AMBULATORY_CARE_PROVIDER_SITE_OTHER): Payer: Medicare Other

## 2019-10-12 VITALS — BP 120/66 | Temp 97.6°F | Ht 63.5 in | Wt 108.0 lb

## 2019-10-12 DIAGNOSIS — Z79899 Other long term (current) drug therapy: Secondary | ICD-10-CM | POA: Diagnosis not present

## 2019-10-12 DIAGNOSIS — D518 Other vitamin B12 deficiency anemias: Secondary | ICD-10-CM | POA: Diagnosis not present

## 2019-10-12 MED ORDER — CYANOCOBALAMIN 1000 MCG/ML IJ SOLN: 1000.0000 ug | Freq: Once | INTRAMUSCULAR | Status: DC

## 2019-10-12 NOTE — Progress Notes (Signed)
PATIENT reports for B12 injection. Given in Rt arm. Vitals entered into epic Patient will return in one mth to have B12 injection & recheck IRON

## 2019-10-13 ENCOUNTER — Ambulatory Visit: Payer: Medicare Other

## 2019-10-25 ENCOUNTER — Encounter: Payer: Self-pay | Admitting: Internal Medicine

## 2019-11-07 ENCOUNTER — Encounter: Payer: Self-pay | Admitting: Internal Medicine

## 2019-11-07 ENCOUNTER — Other Ambulatory Visit: Payer: Self-pay

## 2019-11-07 ENCOUNTER — Ambulatory Visit (INDEPENDENT_AMBULATORY_CARE_PROVIDER_SITE_OTHER): Payer: Medicare Other | Admitting: Internal Medicine

## 2019-11-07 VITALS — BP 144/62 | HR 72 | Temp 97.5°F | Resp 16 | Ht 63.5 in | Wt 104.6 lb

## 2019-11-07 DIAGNOSIS — R519 Headache, unspecified: Secondary | ICD-10-CM | POA: Diagnosis not present

## 2019-11-07 DIAGNOSIS — D508 Other iron deficiency anemias: Secondary | ICD-10-CM

## 2019-11-07 DIAGNOSIS — B0222 Postherpetic trigeminal neuralgia: Secondary | ICD-10-CM

## 2019-11-07 DIAGNOSIS — R531 Weakness: Secondary | ICD-10-CM | POA: Diagnosis not present

## 2019-11-07 DIAGNOSIS — Z20822 Contact with and (suspected) exposure to covid-19: Secondary | ICD-10-CM

## 2019-11-07 DIAGNOSIS — M353 Polymyalgia rheumatica: Secondary | ICD-10-CM | POA: Diagnosis not present

## 2019-11-07 DIAGNOSIS — J449 Chronic obstructive pulmonary disease, unspecified: Secondary | ICD-10-CM

## 2019-11-07 DIAGNOSIS — I10 Essential (primary) hypertension: Secondary | ICD-10-CM

## 2019-11-07 DIAGNOSIS — Z79899 Other long term (current) drug therapy: Secondary | ICD-10-CM

## 2019-11-07 DIAGNOSIS — E441 Mild protein-calorie malnutrition: Secondary | ICD-10-CM

## 2019-11-07 MED ORDER — GABAPENTIN 100 MG PO CAPS: ORAL_CAPSULE | ORAL | 2 refills | Status: DC

## 2019-11-07 MED ORDER — DEXAMETHASONE 0.5 MG PO TABS: ORAL_TABLET | ORAL | 0 refills | Status: DC

## 2019-11-07 MED ORDER — ACYCLOVIR 800 MG PO TABS: ORAL_TABLET | ORAL | 0 refills | Status: DC

## 2019-11-07 NOTE — Patient Instructions (Signed)
Polymyalgia Rheumatica Polymyalgia rheumatica (PMR) is an inflammatory disorder that causes the muscles and joints to ache and become stiff. Sometimes, PMR leads to a more dangerous condition that can cause vision loss (temporal arteritis or giant cell arteritis). What are the causes? The exact cause of PMR is not known. What increases the risk? You are more likely to develop this condition if you are:  Female.  84 years of age or older.  Caucasian. What are the signs or symptoms? Pain and stiffness are the main symptoms of PMR. Symptoms may:  Be worse after inactivity and in the morning.  Affect your: ? Hips, buttocks, and thighs. ? Neck, arms, and shoulders. This can make it hard to raise your arms above your head. ? Hands and wrists. Other symptoms include:  Fever.  Tiredness.  Weakness.  Depression.  Decreased appetite. This may lead to weight loss. Symptoms may start slowly or suddenly. How is this diagnosed? This condition is diagnosed with your medical history and a physical exam. You may need to see a health care provider who specializes in diseases of the joints, muscles, and bones (rheumatologist). You may also have tests, including:  Blood tests.  X-rays.  Ultrasound. How is this treated? PMR usually goes away without treatment, but it may take years. Your health care provider may recommend low-dose steroids and other medicines to help manage your symptoms of pain and stiffness. Regular exercise and rest will also help your symptoms. Follow these instructions at home:   Take over-the-counter and prescription medicines only as told by your health care provider.  Make sure to get enough rest and sleep.  Eat a healthy and nutritious diet.  Try to exercise most days of the week. Ask your health care provider what type of exercise is best for you.  Keep all follow-up visits as told by your health care provider. This is important. Contact a health care  provider if:  Your symptoms do not improve with medicine.  You have side effects from steroids. These may include: ? Weight gain. ? Swelling. ? Insomnia. ? Mood changes. ? Bruising. ? High blood sugar readings, if you have diabetes. ? Higher than normal blood pressure readings, if you monitor your blood pressure. Get help right away if:  You develop symptoms of temporal arteritis, such as: ? A change in vision. ? Severe headache. ? Scalp pain. ? Jaw pain. Summary  Polymyalgia rheumatica is an inflammatory disorder that causes aching and stiffness in your muscles and joints.  The exact cause of this condition is not known.  This condition usually goes away without treatment. Your health care provider may give you low-dose steroids to help manage your pain and stiffness.  Rest and regular exercise will help the symptoms. =============================================   Shingles  Shingles, which is also known as herpes zoster, is an infection that causes a painful skin rash and fluid-filled blisters. It is caused by a virus. Shingles only develops in people who:  Have had chickenpox.  Have been given a medicine to protect against chickenpox (have been vaccinated). Shingles is rare in this group. What are the causes? Shingles is caused by varicella-zoster virus (VZV). This is the same virus that causes chickenpox. After a person is exposed to VZV, the virus stays in the body in an inactive (dormant) state. Shingles develops if the virus is reactivated. This can happen many years after the first (initial) exposure to VZV. It is not known what causes this virus to be reactivated. What  increases the risk? People who have had chickenpox or received the chickenpox vaccine are at risk for shingles. Shingles infection is more common in people who:  Are older than age 22.  Have a weakened disease-fighting system (immune system), such as people with: ? HIV. ? AIDS. ? Cancer.  Are  taking medicines that weaken the immune system, such as transplant medicines.  Are experiencing a lot of stress. What are the signs or symptoms? Early symptoms of this condition include itching, tingling, and pain in an area on your skin. Pain may be described as burning, stabbing, or throbbing. A few days or weeks after early symptoms start, a painful red rash appears. The rash is usually on one side of the body and has a band-like or belt-like pattern. The rash eventually turns into fluid-filled blisters that break open, change into scabs, and dry up in about 2-3 weeks. At any time during the infection, you may also develop:  A fever.  Chills.  A headache.  An upset stomach. How is this diagnosed? This condition is diagnosed with a skin exam. Skin or fluid samples may be taken from the blisters before a diagnosis is made. These samples are examined under a microscope or sent to a lab for testing. How is this treated? The rash may last for several weeks. There is not a specific cure for this condition. Your health care provider will probably prescribe medicines to help you manage pain, recover more quickly, and avoid long-term problems. Medicines may include:  Antiviral drugs.  Anti-inflammatory drugs.  Pain medicines.  Anti-itching medicines (antihistamines). If the area involved is on your face, you may be referred to a specialist, such as an eye doctor (ophthalmologist) or an ear, nose, and throat (ENT) doctor (otolaryngologist) to help you avoid eye problems, chronic pain, or disability. Follow these instructions at home: Medicines  Take over-the-counter and prescription medicines only as told by your health care provider.  Apply an anti-itch cream or numbing cream to the affected area as told by your health care provider. Relieving itching and discomfort   Apply cold, wet cloths (cold compresses) to the area of the rash or blisters as told by your health care  provider.  Cool baths can be soothing. Try adding baking soda or dry oatmeal to the water to reduce itching. Do not bathe in hot water. Blister and rash care  Keep your rash covered with a loose bandage (dressing). Wear loose-fitting clothing to help ease the pain of material rubbing against the rash.  Keep your rash and blisters clean by washing the area with mild soap and cool water as told by your health care provider.  Check your rash every day for signs of infection. Check for: ? More redness, swelling, or pain. ? Fluid or blood. ? Warmth. ? Pus or a bad smell.  Do not scratch your rash or pick at your blisters. To help avoid scratching: ? Keep your fingernails clean and cut short. ? Wear gloves or mittens while you sleep, if scratching is a problem. General instructions  Rest as told by your health care provider.  Keep all follow-up visits as told by your health care provider. This is important.  Wash your hands often with soap and water. If soap and water are not available, use hand sanitizer. Doing this lowers your chance of getting a bacterial skin infection.  Before your blisters change into scabs, your shingles infection can cause chickenpox in people who have never had it or have  never been vaccinated against it. To prevent this from happening, avoid contact with other people, especially: ? Babies. ? Pregnant women. ? Children who have eczema. ? Elderly people who have transplants. ? People who have chronic illnesses, such as cancer or AIDS. Contact a health care provider if:  Your pain is not relieved with prescribed medicines.  Your pain does not get better after the rash heals.  You have signs of infection in the rash area, such as: ? More redness, swelling, or pain around the rash. ? Fluid or blood coming from the rash. ? The rash area feeling warm to the touch. ? Pus or a bad smell coming from the rash. Get help right away if:  The rash is on your face or  nose.  You have facial pain, pain around your eye area, or loss of feeling on one side of your face.  You have difficulty seeing.  You have ear pain or have ringing in your ear.  You have a loss of taste.  Your condition gets worse. Summary  Shingles, which is also known as herpes zoster, is an infection that causes a painful skin rash and fluid-filled blisters.  This condition is diagnosed with a skin exam. Skin or fluid samples may be taken from the blisters and examined before the diagnosis is made.  Keep your rash covered with a loose bandage (dressing). Wear loose-fitting clothing to help ease the pain of material rubbing against the rash.  Before your blisters change into scabs, your shingles infection can cause chickenpox in people who have never had it or have never been vaccinated against it. This information is not intended to replace advice given to you by your health care provider. Make sure you discuss any questions you have with your health care provider. Document Revised: 02/11/2019 Document Reviewed: 06/24/2017 Elsevier Patient Education  2020 [employer].

## 2019-11-07 NOTE — Progress Notes (Signed)
History of Present Illness:      Patient  very nice 86 yo [name] with HTN, HLD, Pre-Diabetes, Hypothyroidism, Vit B12 Deficiency, GERD, CKD3 , Ch Anemia and Vitamin D Deficiency who is brought in by her daughter for 3-4 day hx/o Right jaw/facial pain described as sharp & beginning 3-4 days ago after discovering that she had been exposed to Covid-19 at her hairdressers' 14 days previous. Patient denies any injury. Reports intensity waxes & wanes in intensity. Hurts to chew.  Denies head/sinus congestion, S/T,fever, chills, cough or chest congestion. Denies visual sx's or visual loss.   Medications  .  levothyroxine (SYNTHROID) 125 MCG tablet, Take 1 tablet daily on an empty stomach with only water for 30 minutes & no Antacid meds, Calcium or Magnesium for 4 hours & avoid Biotin .  amLODipine (NORVASC) 2.5 MG tablet, Take 1 tablet Daily for BP .  bisoprolol-hydrochlorothiazide (ZIAC) 5-6.25 MG tablet, Take 1 tablet Daily with Breakfast for BP .  cyanocobalamin (,VITAMIN B-12,) 1000 MCG/ML injection, INJECT 1 MILLILITER INTO THE SKIN EVERY 30 DAYS .  ferrous sulfate 325 (65 FE) MG tablet, Take 325 mg by mouth daily with breakfast. .  cyanocobalamin ((VITAMIN B-12)) injection 1,000 mcg .  cyanocobalamin ((VITAMIN B-12)) injection 1,000 mcg .  ALPRAZolam (XANAX) 0.25 MG tablet, Take 1 tablet at hour of sleep ONLY if needed .  amitriptyline (ELAVIL) 50 MG tablet, TAKE 1 TABLET BY MOUTH EVERYDAY AT BEDTIME .  Cholecalciferol (VITAMIN D-3) 5000 UNITS TABS, Take 5,000 Units by mouth daily. Marland Kitchen  omeprazole (PRILOSEC) 40 MG capsule, Take 1 capsule Daily to Prevent Indigestion & HeartburnT .  Probiotic Product (RESTORA) CAPS, 1 pill daily .  vitamin C (ASCORBIC ACID) 500 MG tablet, Take 500 mg by mouth daily.  Problem list She has Hypertension; Hyperlipidemia, mixed; B12 deficiency anemia; Anemia; Gastroesophageal reflux disease; Vitamin D deficiency; Abnormal glucose; Medication management;  Hypothyroidism; Macular degeneration; Lower back pain; COPD (chronic obstructive pulmonary disease) ([place]); Malnutrition of mild degree ([place]); and Stage 3 chronic kidney disease on their problem list.   Observations/Objective:  BP (!) 144/62   Pulse 72   Temp (!) 97.5 F (36.4 C)   Resp 16   Ht 5' 3.5" (1.613 m)   Wt 104 lb 9.6 oz (47.4 kg)   BMI 18.24 kg/m   Frail appearing elderly WF visibly distressed or having  HEENT -  PERRLA. Lt EAC/TM - Normal. Rt EAC- NL  & Rt TM with ? Sl irreg surface. Slight Right TM jt tenderness. Temporal Aa not palpitated. No facial asymetry. O/P clear /edentulous.                                       Neck - supple. Car 2+/2+ w/o B, N, JVD Chest - Clear equal BS. Cor - Nl HS. RRR w/o sig MGR. PP 1(+). No edema. MS- FROM w/o deformities.  Gait Nl. Neuro -  Nl w/o focal abnormalities.  Assessment and Plan:  1. Right facial pain  - Sedimentation rate - C-reactive protein - Varicella zoster antibody, IgG - Varicella zoster antibody, IgM - dexamethasone (DECADRON) 0.5 MG tablet; Take 1 tab 3 x day - 3 days, then 2 x day - 3 days, then 1 tab daily  Dispense: 20 tablet; Refill: 0 - gabapentin (NEURONTIN) 100 MG capsule; Take 1 capsule 3 x / day for Pain  Dispense: 90 capsule; Refill: 2  2. Trigeminal herpes zoster  - Varicella zoster antibody, IgG - Varicella zoster antibody, IgM - dexamethasone (DECADRON) 0.5 MG tablet; Take 1 tab 3 x day - 3 days, then 2 x day - 3 days, then 1 tab daily  Dispense: 20 tablet; Refill: 0 - acyclovir (ZOVIRAX) 800 MG tablet; Take 1 capsule 3 x /day with Meals  Dispense: 30 tablet; Refill: 0 - gabapentin (NEURONTIN) 100 MG capsule; Take 1 capsule 3 x / day for Pain  Dispense: 90 capsule; Refill: 2  3. PMR (polymyalgia rheumatica) ([facility])  - Sedimentation rate - C-reactive protein - dexamethasone (DECADRON) 0.5 MG tablet; Take 1 tab 3 x day - 3 days, then 2 x day - 3 days, then 1 tab daily  Dispense: 20 tablet; Refill:  0 - gabapentin (NEURONTIN) 100 MG capsule; Take 1 capsule 3 x / day for Pain  Dispense: 90 capsule; Refill: 2  4. Other iron deficiency anemia  - CBC with Diff - Iron,Total/Total Iron Binding Cap  5. Essential hypertension  - COMPLETE METABOLIC PANEL WITH GFR  6. Weakness  - COMPLETE METABOLIC PANEL WITH GFR  7. Medication management  - CBC with Diff - COMPLETE METABOLIC PANEL WITH GFR - Iron,Total/Total Iron Binding Cap - Sedimentation rate - C-reactive protein - Varicella zoster antibody, IgG - Varicella zoster antibody, IgM  8. Contact with and (suspected) exposure to covid-19  - SAR CoV2 Serology (COVID 19)AB(IGG)IA            I discussed the assessment and treatment plan with the patient. The patient was provided an opportunity to ask questions and all were answered. The patient agreed with the plan and demonstrated an understanding of the instructions.  The patient was advised to call back or seek an in-person evaluation if the symptoms worsen or if the condition fails to improve as anticipated.  Marinus Maw, MD

## 2019-11-10 ENCOUNTER — Telehealth: Payer: Self-pay | Admitting: *Deleted

## 2019-11-10 LAB — COMPLETE METABOLIC PANEL WITH GFR
AG Ratio: 1.4 (calc) (ref 1.0–2.5)
ALT: 62 U/L — ABNORMAL HIGH (ref 6–29)
AST: 58 U/L — ABNORMAL HIGH (ref 10–35)
Albumin: 3.8 g/dL (ref 3.6–5.1)
Alkaline phosphatase (APISO): 114 U/L (ref 37–153)
BUN/Creatinine Ratio: 17 (calc) (ref 6–22)
BUN: 19 mg/dL (ref 7–25)
CO2: 22 mmol/L (ref 20–32)
Calcium: 9.7 mg/dL (ref 8.6–10.4)
Chloride: 107 mmol/L (ref 98–110)
Creat: 1.13 mg/dL — ABNORMAL HIGH (ref 0.60–0.88)
GFR, Est African American: 50 mL/min/{1.73_m2} — ABNORMAL LOW (ref >=60)
GFR, Est Non African American: 43 mL/min/{1.73_m2} — ABNORMAL LOW (ref >=60)
Globulin: 2.7 g/dL (calc) (ref 1.9–3.7)
Glucose, Bld: 111 mg/dL — ABNORMAL HIGH (ref 65–99)
Potassium: 4.9 mmol/L (ref 3.5–5.3)
Sodium: 139 mmol/L (ref 135–146)
Total Bilirubin: 0.6 mg/dL (ref 0.2–1.2)
Total Protein: 6.5 g/dL (ref 6.1–8.1)

## 2019-11-10 LAB — CBC WITH DIFFERENTIAL/PLATELET
Absolute Monocytes: 570 cells/uL (ref 200–950)
Basophils Absolute: 27 cells/uL (ref 0–200)
Basophils Relative: 0.4 %
Eosinophils Absolute: 27 cells/uL (ref 15–500)
Eosinophils Relative: 0.4 %
HCT: 34.5 % — ABNORMAL LOW (ref 35.0–45.0)
Hemoglobin: 11.4 g/dL — ABNORMAL LOW (ref 11.7–15.5)
Lymphs Abs: 1427 cells/uL (ref 850–3900)
MCH: 30.6 pg (ref 27.0–33.0)
MCHC: 33 g/dL (ref 32.0–36.0)
MCV: 92.7 fL (ref 80.0–100.0)
MPV: 9.6 fL (ref 7.5–12.5)
Monocytes Relative: 8.5 %
Neutro Abs: 4650 cells/uL (ref 1500–7800)
Neutrophils Relative %: 69.4 %
Platelets: 229 10*3/uL (ref 140–400)
RBC: 3.72 10*6/uL — ABNORMAL LOW (ref 3.80–5.10)
RDW: 12.8 % (ref 11.0–15.0)
Total Lymphocyte: 21.3 %
WBC: 6.7 10*3/uL (ref 3.8–10.8)

## 2019-11-10 LAB — C-REACTIVE PROTEIN: CRP: 1.8 mg/L (ref <8.0)

## 2019-11-10 LAB — IRON, TOTAL/TOTAL IRON BINDING CAP
%SAT: 24 % (calc) (ref 16–45)
Iron: 51 ug/dL (ref 45–160)
TIBC: 209 mcg/dL (calc) — ABNORMAL LOW (ref 250–450)

## 2019-11-10 LAB — SAR COV2 SEROLOGY (COVID19)AB(IGG),IA: SARS CoV2 AB IGG: NEGATIVE

## 2019-11-10 LAB — VARICELLA ZOSTER ANTIBODY, IGG: Varicella IgG: 3317 index

## 2019-11-10 LAB — VARICELLA ZOSTER ANTIBODY, IGM: Varicella Zoster Ab IgM: <=0.9 (ref <=0.90)

## 2019-11-10 LAB — SEDIMENTATION RATE: Sed Rate: 9 mm/h (ref 0–30)

## 2019-11-10 NOTE — Telephone Encounter (Signed)
Patient's daughter, [name], called and reported the Prednisone is causing the patient to fell jittery.  Petr Dr Oneta Rack, the patient can leave the Prednisoneof  For 1 day and restart at 1/2 dose. Also, the Gabapentin is causing her to be very sleepy, at 3 capsules( 100 mg each).  Per Dr Oneta Rack, she can reduce the dose to 1 capsule at dinner and bedtime. If she is still sleepy, she can just take 1 capsule at bedtime.

## 2019-11-11 ENCOUNTER — Other Ambulatory Visit: Payer: Self-pay | Admitting: Internal Medicine

## 2019-11-11 MED ORDER — FLUCONAZOLE 150 MG PO TABS: ORAL_TABLET | ORAL | 3 refills | Status: DC

## 2019-11-14 ENCOUNTER — Ambulatory Visit: Payer: Medicare Other

## 2019-11-22 ENCOUNTER — Other Ambulatory Visit: Payer: Self-pay | Admitting: Physician Assistant

## 2019-12-01 ENCOUNTER — Other Ambulatory Visit: Payer: Self-pay | Admitting: *Deleted

## 2019-12-01 ENCOUNTER — Encounter: Payer: Self-pay | Admitting: Internal Medicine

## 2019-12-01 ENCOUNTER — Ambulatory Visit (INDEPENDENT_AMBULATORY_CARE_PROVIDER_SITE_OTHER): Payer: Medicare Other | Admitting: Internal Medicine

## 2019-12-01 ENCOUNTER — Other Ambulatory Visit: Payer: Self-pay

## 2019-12-01 VITALS — BP 122/60 | HR 76 | Temp 97.6°F | Resp 16 | Ht 63.5 in | Wt 105.6 lb

## 2019-12-01 DIAGNOSIS — E538 Deficiency of other specified B group vitamins: Secondary | ICD-10-CM

## 2019-12-01 DIAGNOSIS — E039 Hypothyroidism, unspecified: Secondary | ICD-10-CM

## 2019-12-01 DIAGNOSIS — R519 Headache, unspecified: Secondary | ICD-10-CM | POA: Diagnosis not present

## 2019-12-01 MED ORDER — DEXAMETHASONE 0.5 MG PO TABS: ORAL_TABLET | ORAL | 0 refills | Status: DC

## 2019-12-01 MED ORDER — CYANOCOBALAMIN 1000 MCG/ML IJ SOLN: INTRAMUSCULAR | 2 refills | Status: DC

## 2019-12-01 MED ORDER — CYANOCOBALAMIN 1000 MCG/ML IJ SOLN
1000.0000 ug | Freq: Once | INTRAMUSCULAR | Status: AC
  Administered 2019-12-01: 500 ug via INTRAMUSCULAR

## 2019-12-01 NOTE — Progress Notes (Signed)
Subjective:    Patient ID: Shelby Bradley, female    DOB: [birth year], 86 y.o.   MRN: [id number]  HPI    Patient is an 86 yo [name] withHTN, HLD, Pre-Diabetes, Hypothyroidism, Vit B12 Deficiency, GERD, CKD3 , Ch Anemiaand Vitamin D Deficiency returning for 2 week f/u of Rt jaw/facial pain. Lab w/u included Neg Covid-19 Ab, Normal ESR / hsCRP, (+) Zoster IgG / Negative IgM and negative CBC & CMET. Patient was treated empirically with prednisone & Gabapentin. She reports the Rt facial pain is much better- now just soreness. BP had ben low & daughter was advised too stop her Norvasc & Ziac & BP's have been in the normal range since.   Medication Sig  . acyclovir  800 MG tablet Take 1 capsule 3 x /day with Meals  . XANAX 0.25 MG tablet Take 1 tablet at hour of sleep ONLY if needed  . NORVASC 2.5 MG tablet Take 1 tablet Daily - not taking: Reported on 12/01/2019)  . VITAMIN D 5000 UNITS  Take 5,000 Units  daily.  . ferrous sulfate 325 (65 FE) MG  Take 325 mg by mouth daily with breakfast.  . fDIFLUCAN 150 MG tablet Take 1 tablet 2 x /week  if needed for yeast infection  . gabapentin  100 MG capsule Take 1 capsule 3 x / day for Pain  . levothyroxine  125 MCG tablet Take 1 tablet daily   . omeprazole40 MG capsule Take 1 capsule Daily   . RESTORA CAPS Take 1 capsule Daily  . vitamin C  500 MG tablet Take 500 mg by mouth daily.  Marland Kitchen VITAMIN B-12 1000 MCG/ML inj INJECT 1 ml  EVERY 30 DAYS  . amitriptyline  50 MG tablet 1 TAB  AT BEDTIME (Patient not taking: Reported on 12/01/2019)  . ZIAC 5-6.25 MG tablet Take 1 tablet Daily (Patient not taking: Reported on 12/01/2019)   Medication  . cyanocobalamin ((VITAMIN B-12)) injection 1,000 mcg   Past Medical History:  Diagnosis Date  . Acute cholecystitis 01/18/2019  . Allergy   . Anemia   . Anxiety   . DDD (degenerative disc disease), lumbar   . Depression   . GERD (gastroesophageal reflux disease)   . Hyperlipidemia   . Hypertension   . OA  (osteoarthritis) of knee    right knee  . Pre-diabetes   . Thyroid disease   . Vitamin D deficiency    Past Surgical History:  Procedure Laterality Date  . CATARACT EXTRACTION     both  . CHOLECYSTECTOMY  01/18/2019  . CHOLECYSTECTOMY N/A 01/18/2019   Procedure: LAPAROSCOPIC CHOLECYSTECTOMY WITH INTRAOPERATIVE CHOLANGIOGRAM;  Surgeon: Clovis Riley, MD;  Location: [facility] OR;  Service: General;  Laterality: N/A;  . KNEE ARTHROSCOPY Left    Review of Systems   10 point systems review negative except as above.    Objective:   Physical Exam  BP 122/60   Pulse 76   Temp 97.6 F (36.4 C)   Resp 16   Ht 5' 3.5" (1.613 m)   Wt 105 lb 9.6 oz (47.9 kg)   BMI 18.41 kg/m   HEENT - WNL. EAC's / TM's - Nl  No TMJt tenderness. N/O/P - clear Neck - supple.  Chest - Clear equal BS. Cor - Nl HS. RRR w/o sig MGR. PP 1(+). No edema. MS- FROM w/o deformities.  Gait Nl. Neuro -  Nl w/o focal abnormalities.    Assessment & Plan:   1. Right facial  pain  - Rx Decadron 0.5 mg # 20  To use sparingly for pain or soreness   2. Vitamin B 12 deficiency  - cyanocobalamin (VITAMIN B-12 injection 1,000 mcg

## 2019-12-19 ENCOUNTER — Encounter (INDEPENDENT_AMBULATORY_CARE_PROVIDER_SITE_OTHER): Payer: Medicare Other | Admitting: Ophthalmology

## 2019-12-20 NOTE — Progress Notes (Deleted)
   Subjective:    Patient ID: Shelby Bradley, female    DOB: 1928/09/12, 86 y.o.   MRN: [id number]  HPI 86 y.o. WF with history of HTN, chol, B12 def, GERD, hypothyroidism, COPD NOT on inhaler, malnutrition, CKD stage 3 presents with thrush.  Recent ABX?  There were no vitals taken for this visit.  Medications  Current Outpatient Medications (Endocrine & Metabolic):  .  dexamethasone (DECADRON) 0.5 MG tablet, Take 1/2 to 1 tab daily for Pain or Inflammation .  levothyroxine (SYNTHROID) 125 MCG tablet, Take 1 tablet daily on an empty stomach with only water for 30 minutes & no Antacid meds, Calcium or Magnesium for 4 hours & avoid Biotin   Current Outpatient Medications (Cardiovascular):  .  amLODipine (NORVASC) 2.5 MG tablet, Take 1 tablet Daily for BP .  bisoprolol-hydrochlorothiazide (ZIAC) 5-6.25 MG tablet, Take 1 tablet Daily with Breakfast for BP (Patient not taking: Reported on 12/01/2019)       Current Outpatient Medications (Hematological):  .  cyanocobalamin (,VITAMIN B-12,) 1000 MCG/ML injection, INJECT 1 MILLILITER INTO THE SKIN EVERY 30 DAYS .  ferrous sulfate 325 (65 FE) MG tablet, Take 325 mg by mouth daily with breakfast.  Current Facility-Administered Medications (Hematological):  .  cyanocobalamin ((VITAMIN B-12)) injection 1,000 mcg .  cyanocobalamin ((VITAMIN B-12)) injection 1,000 mcg  Current Outpatient Medications (Other):  .  acyclovir (ZOVIRAX) 800 MG tablet, Take 1 capsule 3 x /day with Meals .  ALPRAZolam (XANAX) 0.25 MG tablet, Take 1 tablet at hour of sleep ONLY if needed .  amitriptyline (ELAVIL) 50 MG tablet, TAKE 1 TABLET BY MOUTH EVERYDAY AT BEDTIME (Patient not taking: Reported on 12/01/2019) .  Cholecalciferol (VITAMIN D-3) 5000 UNITS TABS, Take 5,000 Units by mouth daily. .  fluconazole (DIFLUCAN) 150 MG tablet, Take 1 tablet 2 x /week  if needed for yeast infection .  gabapentin (NEURONTIN) 100 MG capsule, Take 1 capsule 3 x / day for Pain .   omeprazole (PRILOSEC) 40 MG capsule, Take 1 capsule Daily to Prevent Indigestion & HeartburnT .  Probiotic Product (RESTORA) CAPS, Take 1 capsule Daily .  vitamin C (ASCORBIC ACID) 500 MG tablet, Take 500 mg by mouth daily.   Problem list She has Hypertension; Hyperlipidemia, mixed; B12 deficiency anemia; Anemia; Gastroesophageal reflux disease; Vitamin D deficiency; Abnormal glucose; Medication management; Hypothyroidism; Macular degeneration; Lower back pain; COPD (chronic obstructive pulmonary disease) ([facility]); Malnutrition of mild degree ([facility]); and Stage 3 chronic kidney disease on their problem list.  Review of Systems     Objective:   Physical Exam        Assessment & Plan:

## 2019-12-21 ENCOUNTER — Ambulatory Visit: Payer: Medicare Other | Admitting: Physician Assistant

## 2020-01-01 ENCOUNTER — Ambulatory Visit: Payer: Medicare Other | Attending: Internal Medicine

## 2020-01-01 ENCOUNTER — Encounter: Payer: Self-pay | Admitting: Internal Medicine

## 2020-01-01 DIAGNOSIS — Z23 Encounter for immunization: Secondary | ICD-10-CM

## 2020-01-01 NOTE — Progress Notes (Signed)
   Covid-19 Vaccination Clinic  Name:  JANECE LAIDLAW    MRN: [id number] DOB: [birth year]  01/01/2020  Ms. Timoney was observed post Covid-19 immunization for 15 minutes without incidence. She was provided with Vaccine Information Sheet and instruction to access the V-Safe system.   Ms. Schadt was instructed to call 911 with any severe reactions post vaccine: Marland Kitchen Difficulty breathing  . Swelling of your face and throat  . A fast heartbeat  . A bad rash all over your body  . Dizziness and weakness    Immunizations Administered    Name Date Dose VIS Date Route   Pfizer COVID-19 Vaccine 01/01/2020  3:26 PM 0.3 mL 10/14/2019 Intramuscular   Manufacturer: [employer], [employer]   Lot: [id number]   NDC: [address]-0

## 2020-01-01 NOTE — Progress Notes (Addendum)
RESCHEDULED

## 2020-01-02 ENCOUNTER — Ambulatory Visit: Payer: Medicare Other | Admitting: Internal Medicine

## 2020-01-02 ENCOUNTER — Other Ambulatory Visit: Payer: Self-pay | Admitting: Internal Medicine

## 2020-01-02 DIAGNOSIS — R519 Headache, unspecified: Secondary | ICD-10-CM

## 2020-01-02 DIAGNOSIS — B379 Candidiasis, unspecified: Secondary | ICD-10-CM

## 2020-01-02 MED ORDER — FLUCONAZOLE 150 MG PO TABS: ORAL_TABLET | ORAL | 3 refills | Status: DC

## 2020-01-02 MED ORDER — DEXAMETHASONE 0.5 MG PO TABS: ORAL_TABLET | ORAL | 0 refills | Status: DC

## 2020-01-08 ENCOUNTER — Encounter: Payer: Self-pay | Admitting: Internal Medicine

## 2020-01-08 NOTE — Progress Notes (Signed)
History of Present Illness:                                                  This very nice 86 y.o. [name]  presents for 3 month follow up with HTN, HLD, Hypothyroidism, , Pre-Diabetes, Vit B12 Deficiency and Vitamin D Deficiency. Patient has GERD controlled on her meds      Patient was recently evaluated for left temple - ear -jaw pains with Zoster ruled out and also Temporal Arteritis. She was treated with low dose decadron with sx's mostly resolving.  Daughter now in hindsight suspects sx's may have been a result of poorly fitting lower dentures and dental follow-up is scheduled as lower dentures have ben left out due to poor fit.       Patient is treated for HTN circa 1987  & BP has been controlled at home. Today's BP is at goal - 122/66. Patient has had no complaints of any cardiac type chest pain, palpitations, dyspnea / orthopnea / PND, dizziness, claudication, or dependent edema.      Hyperlipidemia is controlled with diet & meds. Patient denies myalgias or other med SE's. Last Lipids were   Lab Results  Component Value Date   CHOL 137 09/13/2019   HDL 51 09/13/2019   LDLCALC 70 09/13/2019   TRIG 84 09/13/2019   CHOLHDL 2.7 09/13/2019    Also, the patient has history of  PreDiabetes (A1c 5.7%/2016)  and has had no symptoms of reactive hypoglycemia, diabetic polys, paresthesias or visual blurring.  Last A1c was Normal & at goal:  Lab Results  Component Value Date   HGBA1C 5.5 05/04/2018                                                           Patient was dx'd Hypothyroid circa 1990's and  has been on Thyroid Replacement since then.   Wt Readings from Last 3 Encounters:  01/09/20 97 lb 9.6 oz (44.3 kg)  12/01/19 105 lb 9.6 oz (47.9 kg)  11/07/19 104 lb 9.6 oz (47.4 kg)                                                        Patient has Vitamin B12 Deficiency & is on parenteral replacement.                                      Further, the patient also has history of Vitamin Deficiency("26"/2016) and supplements vitamin D without any suspected side-effects. Last vitamin D was at goal:  Lab Results  Component Value Date   VD25OH 63 09/13/2019    Current Outpatient Medications on File Prior to Visit  Medication Sig  . ALPRAZolam (XANAX) 0.25 MG tablet Take 1 tablet at hour of sleep ONLY if needed  . amitriptyline (ELAVIL) 50 MG tablet TAKE 1 TABLET BY MOUTH EVERYDAY AT BEDTIME  .  amLODipine (NORVASC) 2.5 MG tablet Take 1 tablet Daily for BP  . bisoprolol-hydrochlorothiazide (ZIAC) 5-6.25 MG tablet Take 1 tablet Daily with Breakfast for BP  . Cholecalciferol (VITAMIN D-3) 5000 UNITS TABS Take 5,000 Units by mouth daily.  . cyanocobalamin (,VITAMIN B-12,) 1000 MCG/ML injection INJECT 1 MILLILITER INTO THE SKIN EVERY 30 DAYS  . dexamethasone (DECADRON) 0.5 MG tablet Take 1/2 to 1 tab daily for Pain or Inflammation  . ferrous sulfate 325 (65 FE) MG tablet Take 325 mg by mouth daily with breakfast.  . fluconazole (DIFLUCAN) 150 MG tablet Take 1 tablet 2 x /week  if needed for yeast infection  . gabapentin (NEURONTIN) 100 MG capsule Take 1 capsule 3 x / day for Pain  . levothyroxine (SYNTHROID) 125 MCG tablet Take 1 tablet daily on an empty stomach with only water for 30 minutes & no Antacid meds, Calcium or Magnesium for 4 hours & avoid Biotin  . omeprazole (PRILOSEC) 40 MG capsule Take 1 capsule Daily to Prevent Indigestion & HeartburnT  . Probiotic Product (RESTORA) CAPS Take 1 capsule Daily  . vitamin C (ASCORBIC ACID) 500 MG tablet Take 500 mg by mouth daily.   Current Facility-Administered Medications on File Prior to Visit  Medication  . cyanocobalamin ((VITAMIN B-12)) injection 1,000 mcg  . cyanocobalamin ((VITAMIN B-12)) injection 1,000 mcg    Allergies  Allergen Reactions  . Norvasc [Amlodipine Besylate]   . Sulfa Antibiotics     PMHx:   Past Medical History:  Diagnosis Date  . Acute  cholecystitis 01/18/2019  . Allergy   . Anemia   . Anxiety   . DDD (degenerative disc disease), lumbar   . Depression   . GERD (gastroesophageal reflux disease)   . Hyperlipidemia   . Hypertension   . OA (osteoarthritis) of knee    right knee  . Pre-diabetes   . Thyroid disease   . Vitamin D deficiency     Immunization History  Administered Date(s) Administered  . DTaP 05/20/2012  . Influenza Whole 07/21/2013  . Influenza, High Dose Seasonal PF 06/29/2014, 07/30/2015, 07/10/2016, 09/07/2017, 08/10/2018, 08/24/2019  . PFIZER SARS-COV-2 Vaccination 01/01/2020  . Pneumococcal Conjugate-13 05/29/2014  . Pneumococcal Polysaccharide-23 05/07/2010, 12/05/2014  . Tdap 05/20/2012  . Zoster 11/03/2006    Past Surgical History:  Procedure Laterality Date  . CATARACT EXTRACTION     both  . CHOLECYSTECTOMY  01/18/2019  . CHOLECYSTECTOMY N/A 01/18/2019   Procedure: LAPAROSCOPIC CHOLECYSTECTOMY WITH INTRAOPERATIVE CHOLANGIOGRAM;  Surgeon: Berna Bue, MD;  Location: [facility] OR;  Service: General;  Laterality: N/A;  . KNEE ARTHROSCOPY Left     FHx:    Reviewed / unchanged  SHx:    Reviewed / unchanged   Systems Review:  Constitutional: Denies fever, chills, wt changes, headaches, insomnia, fatigue, night sweats, change in appetite. Eyes: Denies redness, blurred vision, diplopia, discharge, itchy, watery eyes.  ENT: Denies discharge, congestion, post nasal drip, epistaxis, sore throat, earache, hearing loss, dental pain, tinnitus, vertigo, sinus pain, snoring.  CV: Denies chest pain, palpitations, irregular heartbeat, syncope, dyspnea, diaphoresis, orthopnea, PND, claudication or edema. Respiratory: denies cough, dyspnea, DOE, pleurisy, hoarseness, laryngitis, wheezing.  Gastrointestinal: Denies dysphagia, odynophagia, heartburn, reflux, water brash, abdominal pain or cramps, nausea, vomiting, bloating, diarrhea, constipation, hematemesis, melena, hematochezia  or  hemorrhoids. Genitourinary: Denies dysuria, frequency, urgency, nocturia, hesitancy, discharge, hematuria or flank pain. Musculoskeletal: Denies arthralgias, myalgias, stiffness, jt. swelling, pain, limping or strain/sprain.  Skin: Denies pruritus, rash, hives, warts, acne, eczema or change in skin lesion(s).  Neuro: No weakness, tremor, incoordination, spasms, paresthesia or pain. Psychiatric: Denies confusion, memory loss or sensory loss. Endo: Denies change in weight, skin or hair change.  Heme/Lymph: No excessive bleeding, bruising or enlarged lymph nodes.  Physical Exam  BP 122/66   Pulse 80   Temp 97.6 F (36.4 C)   Resp 16   Ht 5' 3.5" (1.613 m)   Wt 97 lb 9.6 oz (44.3 kg)   BMI 17.02 kg/m   Appears  well nourished, well groomed  and in no distress.  Eyes: PERRLA, EOMs, conjunctiva no swelling or erythema. Sinuses: No frontal/maxillary tenderness ENT/Mouth: EAC's clear, TM's nl w/o erythema, bulging. Nares clear w/o erythema, swelling, exudates. Oropharynx clear without erythema or exudates. Oral hygiene is good. Tongue normal, non obstructing. Hearing intact.  Neck: Supple. Thyroid not palpable. Car 2+/2+ without bruits, nodes or JVD. Chest: Respirations nl with BS clear & equal w/o rales, rhonchi, wheezing or stridor.  Cor: Heart sounds normal w/ regular rate and rhythm without sig. murmurs, gallops, clicks or rubs. Peripheral pulses normal and equal  without edema.  Abdomen: Soft & bowel sounds normal. Non-tender w/o guarding, rebound, hernias, masses or organomegaly.  Lymphatics: Unremarkable.  Musculoskeletal: Full ROM all peripheral extremities, joint stability, 5/5 strength and normal gait.  Skin: Warm, dry without exposed rashes, lesions or ecchymosis apparent.  Neuro: Cranial nerves intact, reflexes equal bilaterally. Sensory-motor testing grossly intact. Tendon reflexes grossly intact.  Pysch: Alert & oriented x 3.  Insight and judgement nl & appropriate. No  ideations.  Assessment and Plan:  1. Essential hypertension  - Continue medication, monitor blood pressure at home.  - Continue DASH diet.  Reminder to go to the ER if any CP,  SOB, nausea, dizziness, severe HA, changes vision/speech.  - CBC with Differential/Platelet - COMPLETE METABOLIC PANEL WITH GFR - Magnesium - TSH  2. Hyperlipidemia, mixed  - Continue diet/meds, exercise,& lifestyle modifications.  - Continue monitor periodic cholesterol/liver & renal functions   - Lipid panel - TSH  3. Abnormal glucose  - Continue diet, exercise  - Lifestyle modifications.  - Monitor appropriate labs.  - Hemoglobin A1c - Insulin, random  4. Vitamin D deficiency  - Continue supplementation.  - VITAMIN D 25 Hydroxy  5. Hypothyroidism  - TSH  6. Medication management  - CBC with Differential/Platelet - COMPLETE METABOLIC PANEL WITH GFR - Magnesium - Lipid panel - TSH - Hemoglobin A1c - Insulin, random - VITAMIN D 25 Hydroxy        Discussed  regular exercise, BP monitoring, weight control to achieve/maintain BMI less than 25 and discussed med and SE's. Recommended labs to assess and monitor clinical status with further disposition pending results of labs.  I discussed the assessment and treatment plan with the patient. The patient was provided an opportunity to ask questions and all were answered. The patient agreed with the plan and demonstrated an understanding of the instructions.  I provided over 30 minutes of exam, counseling, chart review and  complex critical decision making.   Kirtland Bouchard, MD

## 2020-01-08 NOTE — Patient Instructions (Signed)

## 2020-01-09 ENCOUNTER — Ambulatory Visit (INDEPENDENT_AMBULATORY_CARE_PROVIDER_SITE_OTHER): Payer: Medicare Other | Admitting: Internal Medicine

## 2020-01-09 ENCOUNTER — Other Ambulatory Visit: Payer: Self-pay

## 2020-01-09 VITALS — BP 122/66 | HR 80 | Temp 97.6°F | Resp 16 | Ht 63.5 in | Wt 97.6 lb

## 2020-01-09 DIAGNOSIS — E559 Vitamin D deficiency, unspecified: Secondary | ICD-10-CM

## 2020-01-09 DIAGNOSIS — I10 Essential (primary) hypertension: Secondary | ICD-10-CM

## 2020-01-09 DIAGNOSIS — E538 Deficiency of other specified B group vitamins: Secondary | ICD-10-CM | POA: Diagnosis not present

## 2020-01-09 DIAGNOSIS — E782 Mixed hyperlipidemia: Secondary | ICD-10-CM | POA: Diagnosis not present

## 2020-01-09 DIAGNOSIS — E039 Hypothyroidism, unspecified: Secondary | ICD-10-CM

## 2020-01-09 DIAGNOSIS — R7309 Other abnormal glucose: Secondary | ICD-10-CM

## 2020-01-09 DIAGNOSIS — Z79899 Other long term (current) drug therapy: Secondary | ICD-10-CM

## 2020-01-09 MED ORDER — CYANOCOBALAMIN 1000 MCG/ML IJ SOLN
1000.0000 ug | Freq: Once | INTRAMUSCULAR | Status: AC
  Administered 2020-01-09: 16:00:00 500 ug via INTRAMUSCULAR

## 2020-01-10 ENCOUNTER — Ambulatory Visit: Payer: Medicare Other | Admitting: Internal Medicine

## 2020-01-10 ENCOUNTER — Other Ambulatory Visit: Payer: Self-pay | Admitting: Internal Medicine

## 2020-01-10 DIAGNOSIS — E039 Hypothyroidism, unspecified: Secondary | ICD-10-CM

## 2020-01-10 LAB — CBC WITH DIFFERENTIAL/PLATELET
Absolute Monocytes: 311 cells/uL (ref 200–950)
Basophils Absolute: 21 cells/uL (ref 0–200)
Basophils Relative: 0.5 %
Eosinophils Absolute: 8 cells/uL — ABNORMAL LOW (ref 15–500)
Eosinophils Relative: 0.2 %
HCT: 28.8 % — ABNORMAL LOW (ref 35.0–45.0)
Hemoglobin: 9.5 g/dL — ABNORMAL LOW (ref 11.7–15.5)
Lymphs Abs: 739 cells/uL — ABNORMAL LOW (ref 850–3900)
MCH: 30.7 pg (ref 27.0–33.0)
MCHC: 33 g/dL (ref 32.0–36.0)
MCV: 93.2 fL (ref 80.0–100.0)
MPV: 9.2 fL (ref 7.5–12.5)
Monocytes Relative: 7.4 %
Neutro Abs: 3121 cells/uL (ref 1500–7800)
Neutrophils Relative %: 74.3 %
Platelets: 209 10*3/uL (ref 140–400)
RBC: 3.09 10*6/uL — ABNORMAL LOW (ref 3.80–5.10)
RDW: 13.3 % (ref 11.0–15.0)
Total Lymphocyte: 17.6 %
WBC: 4.2 10*3/uL (ref 3.8–10.8)

## 2020-01-10 LAB — LIPID PANEL
Cholesterol: 132 mg/dL (ref <200)
HDL: 52 mg/dL (ref >=50)
LDL Cholesterol (Calc): 63 mg/dL (calc)
Non-HDL Cholesterol (Calc): 80 mg/dL (calc) (ref <130)
Total CHOL/HDL Ratio: 2.5 (calc) (ref <5.0)
Triglycerides: 85 mg/dL (ref <150)

## 2020-01-10 LAB — COMPLETE METABOLIC PANEL WITH GFR
AG Ratio: 1.5 (calc) (ref 1.0–2.5)
ALT: 17 U/L (ref 6–29)
AST: 22 U/L (ref 10–35)
Albumin: 3.4 g/dL — ABNORMAL LOW (ref 3.6–5.1)
Alkaline phosphatase (APISO): 71 U/L (ref 37–153)
BUN/Creatinine Ratio: 15 (calc) (ref 6–22)
BUN: 18 mg/dL (ref 7–25)
CO2: 23 mmol/L (ref 20–32)
Calcium: 9.2 mg/dL (ref 8.6–10.4)
Chloride: 108 mmol/L (ref 98–110)
Creat: 1.19 mg/dL — ABNORMAL HIGH (ref 0.60–0.88)
GFR, Est African American: 47 mL/min/{1.73_m2} — ABNORMAL LOW (ref >=60)
GFR, Est Non African American: 40 mL/min/{1.73_m2} — ABNORMAL LOW (ref >=60)
Globulin: 2.2 g/dL (calc) (ref 1.9–3.7)
Glucose, Bld: 110 mg/dL — ABNORMAL HIGH (ref 65–99)
Potassium: 5.9 mmol/L — ABNORMAL HIGH (ref 3.5–5.3)
Sodium: 136 mmol/L (ref 135–146)
Total Bilirubin: 0.3 mg/dL (ref 0.2–1.2)
Total Protein: 5.6 g/dL — ABNORMAL LOW (ref 6.1–8.1)

## 2020-01-10 LAB — INSULIN, RANDOM: Insulin: 3.4 u[IU]/mL

## 2020-01-10 LAB — HEMOGLOBIN A1C
Hgb A1c MFr Bld: 5.3 % of total Hgb (ref <5.7)
Mean Plasma Glucose: 105 (calc)
eAG (mmol/L): 5.8 (calc)

## 2020-01-10 LAB — VITAMIN D 25 HYDROXY (VIT D DEFICIENCY, FRACTURES): Vit D, 25-Hydroxy: 88 ng/mL (ref 30–100)

## 2020-01-10 LAB — MAGNESIUM: Magnesium: 1.5 mg/dL (ref 1.5–2.5)

## 2020-01-10 LAB — TSH: TSH: 0.07 mIU/L — ABNORMAL LOW (ref 0.40–4.50)

## 2020-01-10 MED ORDER — LEVOTHYROXINE SODIUM 50 MCG PO TABS: ORAL_TABLET | ORAL | 1 refills | Status: DC

## 2020-01-11 ENCOUNTER — Encounter: Payer: Self-pay | Admitting: *Deleted

## 2020-01-17 ENCOUNTER — Ambulatory Visit: Payer: Medicare Other | Admitting: Internal Medicine

## 2020-01-27 ENCOUNTER — Encounter (INDEPENDENT_AMBULATORY_CARE_PROVIDER_SITE_OTHER): Payer: Medicare Other | Admitting: Ophthalmology

## 2020-01-31 ENCOUNTER — Ambulatory Visit: Payer: Medicare Other | Attending: Internal Medicine

## 2020-01-31 DIAGNOSIS — Z23 Encounter for immunization: Secondary | ICD-10-CM

## 2020-01-31 NOTE — Progress Notes (Signed)
   Covid-19 Vaccination Clinic  Name:  Shelby Bradley    MRN: [id number] DOB: 01/19/1928  01/31/2020  Shelby Bradley was observed post Covid-19 immunization for 15 minutes without incident. She was provided with Vaccine Information Sheet and instruction to access the V-Safe system.   Shelby Bradley was instructed to call 911 with any severe reactions post vaccine: Marland Kitchen Difficulty breathing  . Swelling of face and throat  . A fast heartbeat  . A bad rash all over body  . Dizziness and weakness   Immunizations Administered    Name Date Dose VIS Date Route   Pfizer COVID-19 Vaccine 01/31/2020  1:33 PM 0.3 mL 10/14/2019 Intramuscular   Manufacturer: [employer], [employer]   Lot: [id number]   NDC: [address]-7

## 2020-02-01 ENCOUNTER — Other Ambulatory Visit: Payer: Self-pay | Admitting: Physician Assistant

## 2020-02-08 ENCOUNTER — Other Ambulatory Visit: Payer: Self-pay | Admitting: Internal Medicine

## 2020-02-08 ENCOUNTER — Ambulatory Visit (INDEPENDENT_AMBULATORY_CARE_PROVIDER_SITE_OTHER): Payer: Medicare Other

## 2020-02-08 ENCOUNTER — Other Ambulatory Visit: Payer: Self-pay

## 2020-02-08 VITALS — BP 120/66 | HR 72 | Temp 97.4°F | Wt 101.0 lb

## 2020-02-08 DIAGNOSIS — Z79899 Other long term (current) drug therapy: Secondary | ICD-10-CM

## 2020-02-08 DIAGNOSIS — E039 Hypothyroidism, unspecified: Secondary | ICD-10-CM

## 2020-02-08 DIAGNOSIS — D518 Other vitamin B12 deficiency anemias: Secondary | ICD-10-CM

## 2020-02-08 LAB — TSH: TSH: 20.37 mIU/L — ABNORMAL HIGH (ref 0.40–4.50)

## 2020-02-08 MED ORDER — CYANOCOBALAMIN 1000 MCG/ML IJ SOLN
1000.0000 ug | Freq: Once | INTRAMUSCULAR | Status: AC
  Administered 2020-03-06: 14:00:00 1000 ug via INTRAMUSCULAR

## 2020-02-08 NOTE — Progress Notes (Signed)
PRESENTS for B12 injection & to recheck TSH.  Vitals entered & all questions/concerns were addressed.

## 2020-02-09 ENCOUNTER — Encounter: Payer: Self-pay | Admitting: *Deleted

## 2020-02-09 ENCOUNTER — Ambulatory Visit: Payer: Medicare Other

## 2020-02-20 ENCOUNTER — Encounter (INDEPENDENT_AMBULATORY_CARE_PROVIDER_SITE_OTHER): Payer: Medicare Other | Admitting: Ophthalmology

## 2020-03-05 NOTE — Progress Notes (Signed)
Assessment and Plan:  Shelby Bradley was seen today for acute visit and fatigue.  Diagnoses and all orders for this visit:  Fatigue, unspecified type Unclear etiology; will follow up labs; coincides with oral pain onset, has had reassuring workup as per HPI, exam otherwise unremarkable, oral intake may be down secondary to oral/facial pain, may improve with improved management, see plan Possible malnutrition, decompensation   Follow up if persistent/not improving Go to the ER if any chest pain, shortness of breath, nausea, dizziness, severe HA, changes vision/speech -     Vitamin B12 -     CBC with Differential/Platelet -     COMPLETE METABOLIC PANEL WITH GFR -     TSH -     Urinalysis, Routine w reflex microscopic  B12 deficiency Continue monthly injections as indicated -  -     Vitamin B12  Oral pain Oral/facial pain with unclear etiology; ? Thrush contributing, possible TMJ - tylenol/ibuprofen has been helpful. disucssed possibly pursuing CT to further investigate if persistent/worsening, but after discussion will postpone for now Try [employer]'s magic mouthwash for thrush, brush tongue of white coating regularly prior to swish and swallow Reviewed meds, no inhaled or oral steroids Continue tylenol/ibuprofen PRN pain; tylenol is safer;  Reviewed TMJ management - information given to the patient, no gum/decrease hard foods, warm wet wash clothes, decrease stress,  can do massage, and exercise.  Consider follow up with dentist  Oral thrush Hygiene reviewed;  - [employer]'s magic mouthwash   Hypothyroidism, unspecified type -     levothyroxine (SYNTHROID) 50 MCG tablet; Take 1.5 tablet daily on an empty stomach with only water for 30 minutes & no Antacid meds, Calcium or Magnesium for 4 hours & avoid Biotin   Further disposition pending results of labs. Discussed med's effects and SE's.   Over 30 minutes of exam, counseling, chart review, and critical decision making was performed.   Future  Appointments  Date Time Provider [place]  04/12/2020  2:30 PM Vicie Mutters, PA-C [facility] None  07/16/2020  2:30 PM Vicie Mutters, PA-C [facility] None  09/25/2020  2:00 PM Vicie Mutters, PA-C [facility] None    ------------------------------------------------------------------------------------------------------------------   HPI BP (!) 142/88   Pulse 61   Temp 98.1 F (36.7 C)   Wt 98 lb (44.5 kg)   SpO2 96%   BMI 17.09 kg/m   86 y.o.female presents for B12 injection (getting monthly), and also requesting to be seen by a provider, she is accompanied by her daughter, reporting gradual onset of fatigue, generalized weakness that is ongoing since January, as well as oral/tongue/facial pain worse on R which are persistent and not improving.   She reports ongoing since jan 2021; was seen by Dr. Melford Aase, had ESR, CRP, zoster antibodies, SAR covid serology which were negative. She has not been wearing dentures due to oral discomfort, reports able to eat without this though chewing does make discomfort worse. She was felt to have thrush and initiated on diflucan 150 mg twice weekly without significant change in oral pain. Reports didn't do well with decadron that was ordered, can only tolerate gabapentin at night. Has been taking tylenol and ibuprofen 1 tab each which does reportedly control pain well.   Lab Results  Component Value Date   VITAMINB12 1,265 (H) 01/27/2019   She is on thyroid medication. Her medication was changed last visit, taking 50 mcg 1.5 tabs daily since last visit, increased from 50 mcg daily. She reports takes on empty stomach with water, waits  30 min prior to other drinks or food, meds.   Lab Results  Component Value Date   TSH 20.37 (H) 02/08/2020   BMI is Body mass index is 17.09 kg/m., she has been working on diet and exercise. Good appetite, eats small amounts frequently though out the day.  Wt Readings from Last 3 Encounters:  03/06/20 98  lb (44.5 kg)  02/08/20 101 lb (45.8 kg)  01/09/20 97 lb 9.6 oz (44.3 kg)   Chronic anemia with hgb ranging 9-11.5 over last several years; iron was normal though remains on iron slow release  325 mg with vitamin C CBC Latest Ref Rng & Units 01/09/2020 11/07/2019 09/13/2019  WBC 3.8 - 10.8 Thousand/uL 4.2 6.7 4.8  Hemoglobin 11.7 - 15.5 g/dL 9.5(L) 11.4(L) 9.5(L)  Hematocrit 35.0 - 45.0 % 28.8(L) 34.5(L) 29.7(L)  Platelets 140 - 400 Thousand/uL 209 229 192   Lab Results  Component Value Date   IRON 51 11/07/2019   TIBC 209 (L) 11/07/2019   FERRITIN 75 09/13/2019     Past Medical History:  Diagnosis Date  . Acute cholecystitis 01/18/2019  . Allergy   . Anemia   . Anxiety   . DDD (degenerative disc disease), lumbar   . Depression   . GERD (gastroesophageal reflux disease)   . Hyperlipidemia   . Hypertension   . OA (osteoarthritis) of knee    right knee  . Pre-diabetes   . Thyroid disease   . Vitamin D deficiency      Allergies  Allergen Reactions  . Norvasc [Amlodipine Besylate]   . Sulfa Antibiotics     Current Outpatient Medications on File Prior to Visit  Medication Sig  . ALPRAZolam (XANAX) 0.25 MG tablet Take 1 tablet at hour of sleep ONLY if needed  . amitriptyline (ELAVIL) 50 MG tablet TAKE 1 TABLET BY MOUTH EVERYDAY AT BEDTIME  . amLODipine (NORVASC) 2.5 MG tablet Take 1 tablet Daily for BP  . bisoprolol-hydrochlorothiazide (ZIAC) 5-6.25 MG tablet Take 1 tablet Daily with Breakfast for BP  . Cholecalciferol (VITAMIN D-3) 5000 UNITS TABS Take 5,000 Units by mouth daily.  . cyanocobalamin (,VITAMIN B-12,) 1000 MCG/ML injection INJECT 1 MILLILITER INTO THE SKIN EVERY 30 DAYS  . dexamethasone (DECADRON) 0.5 MG tablet Take 1/2 to 1 tab daily for Pain or Inflammation  . ferrous sulfate 325 (65 FE) MG tablet Take 325 mg by mouth daily with breakfast.  . fluconazole (DIFLUCAN) 150 MG tablet Take 1 tablet 2 x /week  if needed for yeast infection  . gabapentin (NEURONTIN)  100 MG capsule Take 1 capsule 3 x / day for Pain  . levothyroxine (SYNTHROID) 50 MCG tablet Take 1 tablet daily on an empty stomach with only water for 30 minutes & no Antacid meds, Calcium or Magnesium for 4 hours & avoid Biotin  . omeprazole (PRILOSEC) 40 MG capsule Take 1 capsule Daily to Prevent Indigestion & HeartburnT  . Probiotic Product (RESTORA) CAPS Take 1 capsule Daily  . vitamin C (ASCORBIC ACID) 500 MG tablet Take 500 mg by mouth daily.   Current Facility-Administered Medications on File Prior to Visit  Medication  . cyanocobalamin ((VITAMIN B-12)) injection 1,000 mcg  . cyanocobalamin ((VITAMIN B-12)) injection 1,000 mcg    ROS: Review of Systems  Constitutional: Positive for malaise/fatigue. Negative for chills, diaphoresis, fever and weight loss.  HENT: Negative for congestion, ear pain, hearing loss, sore throat and tinnitus.        R sided oral/cheek pain  Eyes: Negative.  Respiratory: Negative for cough, shortness of breath and wheezing.   Cardiovascular: Negative for chest pain, palpitations, leg swelling and PND.  Gastrointestinal: Negative for abdominal pain, constipation, diarrhea, nausea and vomiting.  Genitourinary: Negative.   Musculoskeletal: Negative for falls, joint pain and myalgias.  Skin: Negative for rash.  Neurological: Negative for dizziness, tingling, speech change, focal weakness and headaches.  Endo/Heme/Allergies: Negative for polydipsia.  Psychiatric/Behavioral: Negative for depression, hallucinations and substance abuse. The patient is not nervous/anxious and does not have insomnia.      Physical Exam:  BP (!) 142/88   Pulse 61   Temp 98.1 F (36.7 C)   Wt 98 lb (44.5 kg)   SpO2 96%   BMI 17.09 kg/m   General Appearance: Well dressed frail elder female, in no apparent distress. Eyes: PERRLA, EOMs, conjunctiva no swelling or erythema Sinuses: No Frontal/maxillary tenderness ENT/Mouth: Ext aud canals clear, R TM abesent (chronic), L  without erythema, bulging. No erythema, swelling, or exudate on post pharynx. Teeth absent. Gums pink without erythema or lesions. Tongue non-erythematous or lesions, thick white coating. Tonsils not swollen or erythematous. HOH. She does have some questionable TMJ tenderness bil without crepitus, popping.  Neck: Supple, thyroid normal.  Respiratory: Respiratory effort normal, BS equal bilaterally without rales, rhonchi, wheezing or stridor.  Cardio: RRR with no MRGs. Brisk peripheral pulses without edema.  Abdomen: Soft, + BS.  Non tender, no guarding, rebound, hernias, masses. Lymphatics: Non tender without lymphadenopathy.  Musculoskeletal: No obvious deformity, 4/5 symmetrical strength through , slow steady gait with walker Skin: Warm, dry without rashes, lesions, ecchymosis.  Neuro: Cranial nerves intact. Normal muscle tone, no cerebellar symptoms. Sensation intact.  Psych: Awake and oriented X 3, normal affect, Insight and Judgment appropriate.     Izora Ribas, NP 3:10 PM [facility]

## 2020-03-06 ENCOUNTER — Other Ambulatory Visit: Payer: Self-pay

## 2020-03-06 ENCOUNTER — Ambulatory Visit (INDEPENDENT_AMBULATORY_CARE_PROVIDER_SITE_OTHER): Payer: Medicare Other | Admitting: Adult Health

## 2020-03-06 ENCOUNTER — Encounter: Payer: Self-pay | Admitting: Adult Health

## 2020-03-06 VITALS — BP 142/88 | HR 61 | Temp 98.1°F | Wt 98.0 lb

## 2020-03-06 DIAGNOSIS — B370 Candidal stomatitis: Secondary | ICD-10-CM | POA: Diagnosis not present

## 2020-03-06 DIAGNOSIS — E039 Hypothyroidism, unspecified: Secondary | ICD-10-CM | POA: Diagnosis not present

## 2020-03-06 DIAGNOSIS — D518 Other vitamin B12 deficiency anemias: Secondary | ICD-10-CM

## 2020-03-06 DIAGNOSIS — Z79899 Other long term (current) drug therapy: Secondary | ICD-10-CM

## 2020-03-06 DIAGNOSIS — R5383 Other fatigue: Secondary | ICD-10-CM

## 2020-03-06 DIAGNOSIS — E538 Deficiency of other specified B group vitamins: Secondary | ICD-10-CM

## 2020-03-06 DIAGNOSIS — K1379 Other lesions of oral mucosa: Secondary | ICD-10-CM

## 2020-03-06 MED ORDER — LEVOTHYROXINE SODIUM 50 MCG PO TABS: ORAL_TABLET | ORAL | 1 refills | Status: DC

## 2020-03-06 NOTE — Patient Instructions (Addendum)
Sending in [employer]'s magic mouthwash - helps with thrush and oral pain. Can do every 2 hours, or as needed prior to meals, etc.   Be sure to brush your tongue regularly to get off as much white coating as possible  Can do tylenol/ibuprofen (tylenol is safer)   Try heat/ice to cheek area, see if this helps   Follow up if getting any worse, can consider getting a CT scan     Oral Thrush, Adult  Oral thrush is an infection in your mouth and throat. It causes white patches on your tongue and in your mouth. Follow these instructions at home: Helping with soreness   To lessen your pain: ? Drink cold liquids, like water and iced tea. ? Eat frozen ice pops or frozen juices. ? Eat foods that are easy to swallow, like gelatin and ice cream. ? Drink from a straw if the patches in your mouth are painful. General instructions  Take or use over-the-counter and prescription medicines only as told by your doctor. Medicine for oral thrush may be something to swallow, or it may be something to put on the infected area.  Eat plain yogurt that has live cultures in it. Read the label to make sure.  If you wear dentures: ? Take out your dentures before you go to bed. ? Brush them well. ? Soak them in a denture cleaner.  Rinse your mouth with warm salt-water many times a day. To make the salt-water mixture, completely dissolve 1/2-1 teaspoon of salt in 1 cup of warm water. Contact a doctor if:  Your problems are getting worse.  Your problems do not get better in less than 7 days with treatment.  Your infection is spreading. This may show as white patches on the skin outside of your mouth.  You are nursing your baby and you have redness and pain in the nipples. This information is not intended to replace advice given to you by your health care provider. Make sure you discuss any questions you have with your health care provider. Document Revised: 01/22/2018 Document Reviewed: 07/14/2016 Elsevier  Patient Education  2020 [employer].     What is the TMJ? The temporomandibular (tem-PUH-ro-man-DIB-yoo-ler) joint, or the TMJ, connects the upper and lower jawbones. This joint allows the jaw to open wide and move back and forth when you chew, talk, or yawn.There are also several muscles that help this joint move. There can be muscle tightness and pain in the muscle that can cause several symptoms.  What causes TMJ pain? There are many causes of TMJ pain. Repeated chewing (for example, chewing gum) and clenching your teeth can cause pain in the joint. Some TMJ pain has no obvious cause. What can I do to ease the pain? There are many things you can do to help your pain get better. When you have pain:  Eat soft foods and stay away from chewy foods (for example, taffy) Try to use both sides of your mouth to chew Don't chew gum Massage Don't open your mouth wide (for example, during yawning or singing) Don't bite your cheeks or fingernails Lower your amount of stress and worry Applying a warm, damp washcloth to the joint may help. Over-the-counter pain medicines such as ibuprofen (one brand: Advil) or acetaminophen (one brand: Tylenol) might also help. Do not use these medicines if you are allergic to them or if your doctor told you not to use them. How can I stop the pain from coming back? When your  pain is better, you can do these exercises to make your muscles stronger and to keep the pain from coming back:  Resisted mouth opening: Place your thumb or two fingers under your chin and open your mouth slowly, pushing up lightly on your chin with your thumb. Hold for three to six seconds. Close your mouth slowly. Resisted mouth closing: Place your thumbs under your chin and your two index fingers on the ridge between your mouth and the bottom of your chin. Push down lightly on your chin as you close your mouth. Tongue up: Slowly open and close your mouth while keeping the tongue touching the  roof of the mouth. Side-to-side jaw movement: Place an object about one fourth of an inch thick (for example, two tongue depressors) between your front teeth. Slowly move your jaw from side to side. Increase the thickness of the object as the exercise becomes easier Forward jaw movement: Place an object about one fourth of an inch thick between your front teeth and move the bottom jaw forward so that the bottom teeth are in front of the top teeth. Increase the thickness of the object as the exercise becomes easier. These exercises should not be painful. If it hurts to do these exercises, stop doing them and talk to your family doctor.

## 2020-03-07 ENCOUNTER — Ambulatory Visit: Payer: Medicare Other

## 2020-03-07 LAB — COMPLETE METABOLIC PANEL WITH GFR
AG Ratio: 1.9 (calc) (ref 1.0–2.5)
ALT: 14 U/L (ref 6–29)
AST: 23 U/L (ref 10–35)
Albumin: 3.7 g/dL (ref 3.6–5.1)
Alkaline phosphatase (APISO): 72 U/L (ref 37–153)
BUN/Creatinine Ratio: 20 (calc) (ref 6–22)
BUN: 21 mg/dL (ref 7–25)
CO2: 24 mmol/L (ref 20–32)
Calcium: 9 mg/dL (ref 8.6–10.4)
Chloride: 107 mmol/L (ref 98–110)
Creat: 1.07 mg/dL — ABNORMAL HIGH (ref 0.60–0.88)
GFR, Est African American: 53 mL/min/{1.73_m2} — ABNORMAL LOW (ref >=60)
GFR, Est Non African American: 46 mL/min/{1.73_m2} — ABNORMAL LOW (ref >=60)
Globulin: 2 g/dL (calc) (ref 1.9–3.7)
Glucose, Bld: 100 mg/dL — ABNORMAL HIGH (ref 65–99)
Potassium: 4.4 mmol/L (ref 3.5–5.3)
Sodium: 137 mmol/L (ref 135–146)
Total Bilirubin: 0.4 mg/dL (ref 0.2–1.2)
Total Protein: 5.7 g/dL — ABNORMAL LOW (ref 6.1–8.1)

## 2020-03-07 LAB — CBC WITH DIFFERENTIAL/PLATELET
Absolute Monocytes: 392 cells/uL (ref 200–950)
Basophils Absolute: 20 cells/uL (ref 0–200)
Basophils Relative: 0.4 %
Eosinophils Absolute: 29 cells/uL (ref 15–500)
Eosinophils Relative: 0.6 %
HCT: 29.4 % — ABNORMAL LOW (ref 35.0–45.0)
Hemoglobin: 9.5 g/dL — ABNORMAL LOW (ref 11.7–15.5)
Lymphs Abs: 1019 cells/uL (ref 850–3900)
MCH: 31 pg (ref 27.0–33.0)
MCHC: 32.3 g/dL (ref 32.0–36.0)
MCV: 96.1 fL (ref 80.0–100.0)
MPV: 9.4 fL (ref 7.5–12.5)
Monocytes Relative: 8 %
Neutro Abs: 3440 cells/uL (ref 1500–7800)
Neutrophils Relative %: 70.2 %
Platelets: 230 10*3/uL (ref 140–400)
RBC: 3.06 10*6/uL — ABNORMAL LOW (ref 3.80–5.10)
RDW: 13.3 % (ref 11.0–15.0)
Total Lymphocyte: 20.8 %
WBC: 4.9 10*3/uL (ref 3.8–10.8)

## 2020-03-07 LAB — TSH: TSH: 5.4 mIU/L — ABNORMAL HIGH (ref 0.40–4.50)

## 2020-03-07 LAB — URINALYSIS, ROUTINE W REFLEX MICROSCOPIC
Bilirubin Urine: NEGATIVE
Glucose, UA: NEGATIVE
Hgb urine dipstick: NEGATIVE
Ketones, ur: NEGATIVE
Leukocytes,Ua: NEGATIVE
Nitrite: NEGATIVE
Protein, ur: NEGATIVE
Specific Gravity, Urine: 1.012 (ref 1.001–1.03)
pH: <=5 (ref 5.0–8.0)

## 2020-03-07 LAB — VITAMIN B12: Vitamin B-12: >2000 pg/mL — ABNORMAL HIGH (ref 200–1100)

## 2020-03-08 ENCOUNTER — Other Ambulatory Visit: Payer: Self-pay | Admitting: Adult Health

## 2020-03-08 MED ORDER — ALPRAZOLAM 0.25 MG PO TABS: ORAL_TABLET | ORAL | 0 refills | Status: DC

## 2020-03-23 ENCOUNTER — Other Ambulatory Visit: Payer: Self-pay

## 2020-03-23 DIAGNOSIS — E039 Hypothyroidism, unspecified: Secondary | ICD-10-CM

## 2020-03-23 MED ORDER — LEVOTHYROXINE SODIUM 50 MCG PO TABS: ORAL_TABLET | ORAL | 1 refills | Status: DC

## 2020-04-10 NOTE — Progress Notes (Signed)
3 month follow up   Assessment:   Essential hypertension - continue medications, DASH diet, exercise and monitor at home. Call if greater than 130/80.  -     CBC with Differential/Platelet -     CMP/GFR -     TSH  Chronic obstructive pulmonary disease, unspecified COPD type ([place]) Per CXR; Avoid triggers, continue allergy pill  Mixed hyperlipidemia -decrease fatty foods, increase activity.  -     Lipid panel  Hypothyroidism, unspecified type Hypothyroidism-check TSH level, continue medications the same, reminded to take on an empty stomach 30-55mns before food.  -     TSH  Malnutrition of mild degree (HCC) Continue protein/weight gain/ensure -     CMP/GFR  Other abnormal glucose Monitor  Medication management -     Magnesium  Anemia due to vitamin B12 deficiency, unspecified B12 deficiency type Continue injections  Vitamin D deficiency Continue supplement  Anemia, unspecified type check CBC, recent good iron/B12, has been stable   Gastroesophageal reflux disease, esophagitis presence not specified Continue PPI/H2 blocker, diet discussed  BMI <19 reivewed high protein diet;   Over 30 minutes of exam, counseling, chart review and critical decision making was performed Future Appointments  Date Time Provider [place] 07/16/2020  2:30 PM CVicie Mutters PA-C [facility] None  09/25/2020  2:00 PM CVicie Mutters PA-C [facility] None     Subjective:  SGRISEL BLUMENSTOCKis a 86y.o. female who presents for 3 month follow up.   She has chronic hearing loss in left ear from mastoid sx when she was younger, wears bilateral hearing aids.   Follows up with Dr. GKaty Fitchregularly, and he sent her to Dr. MRodman Keyfor mac degeneration, was getting injections but was discharged. Will continue to follow with Dr. GKaty Fitch   She does still drive but does not drive at night.  She takes amitriptyline 1/2 pill at night for sleep.  She will take xanax 0.231mocc during the  day for anxiety, will take 1/2 at least once a week.   On omeprazole 40 mg nightly for GERD and well controlled sx, remote hx of esophageal strictures requiring dilation.   BMI is Body mass index is 17.12 kg/m., she is working on diet and exercise, she is working on weight gain/being stable. She is not taking ensure but trying to increase protein in diet.  Wt Readings from Last 3 Encounters:  04/11/20 98 lb 3.2 oz (44.5 kg)  03/06/20 98 lb (44.5 kg)  02/08/20 101 lb (45.8 kg)   Her blood pressure has been controlled at home, today their BP is BP: 130/64  She does not workout. She denies chest pain, shortness of breath, dizziness.   She is not on cholesterol medication and denies myalgias. Her cholesterol is at goal. The cholesterol last visit was:   Lab Results  Component Value Date   CHOL 132 01/09/2020   HDL 52 01/09/2020   LDLCALC 63 01/09/2020   TRIG 85 01/09/2020   CHOLHDL 2.5 01/09/2020   She does not have diabetes, has CKD due to HTN and age. Last GFR: Lab Results  Component Value Date   GFRNONAA 46 (L) 03/06/2020   Patient is on Vitamin D supplement.   Lab Results  Component Value Date   VD25OH 88913/06/2020     She is on thyroid medication. Her medication was changed last visit,  50 mcg tabs increased from daily to 2 tabs 1 day a week and continue 1.5 all other days.  Lab  Results  Component Value Date   TSH 5.40 (H) 03/06/2020   She has chronic anemia; on B12 supplement injections and iron supplement, typically in hgb 9-10 range since 2017 on review -  CBC Latest Ref Rng & Units 03/06/2020 01/09/2020 11/07/2019  WBC 3.8 - 10.8 Thousand/uL 4.9 4.2 6.7  Hemoglobin 11.7 - 15.5 g/dL 9.5(L) 9.5(L) 11.4(L)  Hematocrit 35.0 - 45.0 % 29.4(L) 28.8(L) 34.5(L)  Platelets 140 - 400 Thousand/uL 230 209 229   Lab Results  Component Value Date   VITAMINB12 >2,000 (H) 03/06/2020   Lab Results  Component Value Date   IRON 51 11/07/2019   TIBC 209 (L) 11/07/2019   FERRITIN 75  09/13/2019    Medication Review:  Current Outpatient Medications (Endocrine & Metabolic):  .  levothyroxine (SYNTHROID) 50 MCG tablet, Take 1.5 tablet daily on an empty stomach with only water for 30 minutes & no Antacid meds, Calcium or Magnesium for 4 hours & avoid Biotin (Patient taking differently: Take 1.5 tablet daily except on 2 tablets on Sunday on an empty stomach with only water for 30 minutes & no Antacid meds, Calcium or Magnesium for 4 hours & avoid Biotin)   Current Outpatient Medications (Cardiovascular):  .  amLODipine (NORVASC) 2.5 MG tablet, Take 1 tablet Daily for BP (Patient not taking: Reported on 04/11/2020) .  bisoprolol-hydrochlorothiazide (ZIAC) 5-6.25 MG tablet, Take 1 tablet Daily with Breakfast for BP (Patient not taking: Reported on 04/11/2020)       Current Outpatient Medications (Hematological):  .  cyanocobalamin (,VITAMIN B-12,) 1000 MCG/ML injection, INJECT 1 MILLILITER INTO THE SKIN EVERY 30 DAYS .  ferrous sulfate 325 (65 FE) MG tablet, Take 325 mg by mouth daily with breakfast.  Current Facility-Administered Medications (Hematological):  .  cyanocobalamin ((VITAMIN B-12)) injection 1,000 mcg .  cyanocobalamin ((VITAMIN B-12)) injection 1,000 mcg  Current Outpatient Medications (Other):  Marland Kitchen  ALPRAZolam (XANAX) 0.25 MG tablet, Take 1 tablet at hour of sleep ONLY if needed .  amitriptyline (ELAVIL) 50 MG tablet, TAKE 1 TABLET BY MOUTH EVERYDAY AT BEDTIME .  Cholecalciferol (VITAMIN D-3) 5000 UNITS TABS, Take 5,000 Units by mouth daily. Marland Kitchen  omeprazole (PRILOSEC) 40 MG capsule, Take 1 capsule Daily to Prevent Indigestion & HeartburnT .  vitamin C (ASCORBIC ACID) 500 MG tablet, Take 500 mg by mouth daily. Marland Kitchen  gabapentin (NEURONTIN) 100 MG capsule, Take 1 capsule 3 x / day for Pain (Patient not taking: Reported on 04/11/2020) .  Probiotic Product (RESTORA) CAPS, Take 1 capsule Daily (Patient not taking: Reported on 04/11/2020)    Allergies  Allergen Reactions   . Norvasc [Amlodipine Besylate]   . Sulfa Antibiotics     Current Problems (verified) Patient Active Problem List   Diagnosis Date Noted  . Stage 3 chronic kidney disease 09/12/2019  . Malnutrition of mild degree ([place]) 11/18/2016  . COPD (chronic obstructive pulmonary disease) ([place]) 07/10/2016  . Hypothyroidism 06/28/2015  . Macular degeneration 06/28/2015  . Lower back pain 06/28/2015  . Medication management 12/05/2014  . Hypertension   . Hyperlipidemia, mixed   . B12 deficiency anemia   . Anemia   . Gastroesophageal reflux disease   . Vitamin D deficiency   . Abnormal glucose    Patient Care Team: Unk Pinto, MD as PCP - General (Internal Medicine) Laurence Spates, MD (Inactive) as Consulting Physician (Gastroenterology) Berenice Primas, MD as Referring Physician (Orthopedic Surgery) Warden Fillers, MD as Consulting Physician (Optometry)  SURGICAL HISTORY She  has a past surgical history that  includes Knee arthroscopy (Left); Cataract extraction; Cholecystectomy (01/18/2019); and Cholecystectomy (N/A, 01/18/2019). FAMILY HISTORY Her family history includes COPD in her father; Heart attack in her brother and brother; Hypertension in her mother. SOCIAL HISTORY She  reports that she has never smoked. She has never used smokeless tobacco. She reports that she does not drink alcohol or use drugs.  Review of Systems  Constitutional: Negative for chills, fever and malaise/fatigue.  HENT: Negative for congestion, ear pain and sore throat.   Eyes: Negative.   Respiratory: Negative for cough, shortness of breath and wheezing.   Cardiovascular: Negative for chest pain, palpitations and leg swelling.  Gastrointestinal: Negative for abdominal pain, blood in stool, constipation, diarrhea, heartburn and melena.  Genitourinary: Negative.   Skin: Negative.   Neurological: Negative for dizziness, sensory change, loss of consciousness and headaches.  Psychiatric/Behavioral:  Negative for depression. The patient is not nervous/anxious and does not have insomnia.      Objective:     Today's Vitals   04/11/20 1528  BP: 130/64  Pulse: 84  Temp: 97.7 F (36.5 C)  SpO2: 98%  Weight: 98 lb 3.2 oz (44.5 kg)  Height: 5' 3.5" (1.613 m)   Body mass index is 17.12 kg/m.  General appearance: alert, no distress, WD thin frail elderly female HEENT: Left eye ptosis, normocephalic, sclerae anicteric, TM pearly on left, right TM white/scarred or absent TM, bil hearing aids nares patent, no discharge or erythema, pharynx normal Oral cavity: MMM, no lesions Neck: supple, no lymphadenopathy, no thyromegaly, no masses Heart: RRR, normal S1, S2, no murmurs Lungs: CTA bilaterally, no wheezes, rhonchi, or rales Abdomen: +bs, soft, non tender, non distended, no masses, no hepatomegaly, no splenomegaly Musculoskeletal: nontender, no swelling, no obvious deformity, mild- moderate cervical kyphosis Extremities: no edema, no cyanosis, no clubbing Pulses: 2+ symmetric, upper and lower extremities, normal cap refill Neurological: alert, oriented x 3, CN2-12 intact, strength normal upper extremities and lower extremities, sensation normal throughout, , no cerebellar signs, gait slow steady  Psychiatric: normal affect, behavior normal, pleasant   The patient's weight, height, BMI, and visual acuity have been recorded in the chart.  I have made referrals, counseling, and provided education to the patient based on review of the above.   Izora Ribas, NP   04/11/2020

## 2020-04-11 ENCOUNTER — Ambulatory Visit (INDEPENDENT_AMBULATORY_CARE_PROVIDER_SITE_OTHER): Payer: Medicare Other | Admitting: Adult Health

## 2020-04-11 ENCOUNTER — Encounter: Payer: Self-pay | Admitting: Adult Health

## 2020-04-11 ENCOUNTER — Other Ambulatory Visit: Payer: Self-pay

## 2020-04-11 VITALS — BP 130/64 | HR 84 | Temp 97.7°F | Ht 63.5 in | Wt 98.2 lb

## 2020-04-11 DIAGNOSIS — D508 Other iron deficiency anemias: Secondary | ICD-10-CM

## 2020-04-11 DIAGNOSIS — I10 Essential (primary) hypertension: Secondary | ICD-10-CM

## 2020-04-11 DIAGNOSIS — D518 Other vitamin B12 deficiency anemias: Secondary | ICD-10-CM | POA: Diagnosis not present

## 2020-04-11 DIAGNOSIS — E782 Mixed hyperlipidemia: Secondary | ICD-10-CM

## 2020-04-11 DIAGNOSIS — Z79899 Other long term (current) drug therapy: Secondary | ICD-10-CM | POA: Diagnosis not present

## 2020-04-11 DIAGNOSIS — E559 Vitamin D deficiency, unspecified: Secondary | ICD-10-CM | POA: Diagnosis not present

## 2020-04-11 DIAGNOSIS — H3530 Unspecified macular degeneration: Secondary | ICD-10-CM

## 2020-04-11 DIAGNOSIS — J449 Chronic obstructive pulmonary disease, unspecified: Secondary | ICD-10-CM

## 2020-04-11 DIAGNOSIS — E039 Hypothyroidism, unspecified: Secondary | ICD-10-CM | POA: Diagnosis not present

## 2020-04-11 DIAGNOSIS — K219 Gastro-esophageal reflux disease without esophagitis: Secondary | ICD-10-CM

## 2020-04-11 DIAGNOSIS — R7309 Other abnormal glucose: Secondary | ICD-10-CM

## 2020-04-11 DIAGNOSIS — E441 Mild protein-calorie malnutrition: Secondary | ICD-10-CM

## 2020-04-11 DIAGNOSIS — N183 Chronic kidney disease, stage 3 unspecified: Secondary | ICD-10-CM | POA: Diagnosis not present

## 2020-04-11 MED ORDER — CYANOCOBALAMIN 1000 MCG/ML IJ SOLN
1000.0000 ug | Freq: Once | INTRAMUSCULAR | Status: AC
  Administered 2020-04-11: 1000 ug via INTRAMUSCULAR

## 2020-04-11 NOTE — Patient Instructions (Signed)
Focus on beans (lentil soup, black bean soup, soft cooked pintos, black eyed peas, etc) , whole grains, nut butters, eggs, soy milk, greek yogurt     Protein Content in Foods  Generally, most healthy people need around 50 grams of protein each day. Depending on your overall health, you may need more or less protein in your diet. Talk to your health care provider or dietitian about how much protein you need. See the following list for the protein content of some common foods. High-protein foods High-protein foods contain 4 grams (4 g) or more of protein per serving. They include:  Beef, ground sirloin (cooked) -- 3 oz have 24 g of protein.  Cheese (hard) -- 1 oz has 7 g of protein.  Chicken breast, boneless and skinless (cooked) -- 3 oz have 13.4 g of protein.  Cottage cheese -- 1/2 cup has 13.4 g of protein.  Egg -- 1 egg has 6 g of protein.  Fish, filet (cooked) -- 1 oz has 6-7 g of protein.  Garbanzo beans (canned or cooked) -- 1/2 cup has 6-7 g of protein.  Kidney beans (canned or cooked) -- 1/2 cup has 6-7 g of protein.  Lamb (cooked) -- 3 oz has 24 g of protein.  Milk -- 1 cup (8 oz) has 8 g of protein.  Nuts (peanuts, pistachios, almonds) -- 1 oz has 6 g of protein.  Peanut butter -- 1 oz has 7-8 g of protein.  Pork tenderloin (cooked) -- 3 oz has 18.4 g of protein.  Pumpkin seeds -- 1 oz has 8.5 g of protein.  Soybeans (roasted) -- 1 oz has 8 g of protein.  Soybeans (cooked) -- 1/2 cup has 11 g of protein.  Soy milk -- 1 cup (8 oz) has 5-10 g of protein.  Soy or vegetable patty -- 1 patty has 11 g of protein.  Sunflower seeds -- 1 oz has 5.5 g of protein.  Tofu (firm) -- 1/2 cup has 20 g of protein.  Tuna (canned in water) -- 3 oz has 20 g of protein.  Yogurt -- 6 oz has 8 g of protein. Low-protein foods Low-protein foods contain 3 grams (3 g) or less of protein per serving. They include:  Beets (raw or cooked) -- 1/2 cup has 1.5 g of protein.  Bran  cereal -- 1/2 cup has 2-3 g of protein.  Bread -- 1 slice has 2.5 g of protein.  Broccoli (raw or cooked) -- 1/2 cup has 2 g of protein.  Collard greens (raw or cooked) -- 1/2 cup has 2 g of protein.  Corn (fresh or cooked) -- 1/2 cup has 2 g of protein.  Cream cheese -- 1 oz has 2 g of protein.  Creamer (half-and-half) -- 1 oz has 1 g of protein.  Flour tortilla -- 1 tortilla has 2.5 g of protein  Frozen yogurt -- 1/2 cup has 3 g of protein.  Fruit or vegetable juice -- 1/2 cup has 1 g of protein.  Green beans (raw or cooked) -- 1/2 cup has 1 g of protein.  Green peas (canned) -- 1/2 cup has 3.5 g of protein.  Muffins -- 1 small muffin (2 oz) has 3 g of protein.  Oatmeal (cooked) -- 1/2 cup has 3 g of protein.  Potato (baked with skin) -- 1 medium potato has 3 g of protein.  Rice (cooked) -- 1/2 cup has 2.5-3.5 g of protein.  Sour cream -- 1/2 cup has 2.5 g  of protein.  Spinach (cooked) -- 1/2 cup has 3 g of protein.  Squash (cooked) -- 1/2 cup has 1.5 g of protein. Actual amounts of protein may be different depending on processing. Talk with your health care provider or dietitian about what foods are recommended for you. This information is not intended to replace advice given to you by your health care provider. Make sure you discuss any questions you have with your health care provider. Document Revised: 06/30/2016 Document Reviewed: 06/30/2016 Elsevier Patient Education  2020 [employer].

## 2020-04-12 ENCOUNTER — Other Ambulatory Visit: Payer: Self-pay | Admitting: Adult Health

## 2020-04-12 ENCOUNTER — Ambulatory Visit: Payer: Medicare Other | Admitting: Physician Assistant

## 2020-04-12 ENCOUNTER — Ambulatory Visit: Payer: Medicare Other | Admitting: Adult Health

## 2020-04-12 DIAGNOSIS — E039 Hypothyroidism, unspecified: Secondary | ICD-10-CM

## 2020-04-12 LAB — CBC WITH DIFFERENTIAL/PLATELET
Absolute Monocytes: 437 cells/uL (ref 200–950)
Basophils Absolute: 18 cells/uL (ref 0–200)
Basophils Relative: 0.4 %
Eosinophils Absolute: 32 cells/uL (ref 15–500)
Eosinophils Relative: 0.7 %
HCT: 29.6 % — ABNORMAL LOW (ref 35.0–45.0)
Hemoglobin: 9.6 g/dL — ABNORMAL LOW (ref 11.7–15.5)
Lymphs Abs: 999 cells/uL (ref 850–3900)
MCH: 31.2 pg (ref 27.0–33.0)
MCHC: 32.4 g/dL (ref 32.0–36.0)
MCV: 96.1 fL (ref 80.0–100.0)
MPV: 9.3 fL (ref 7.5–12.5)
Monocytes Relative: 9.7 %
Neutro Abs: 3015 cells/uL (ref 1500–7800)
Neutrophils Relative %: 67 %
Platelets: 204 10*3/uL (ref 140–400)
RBC: 3.08 10*6/uL — ABNORMAL LOW (ref 3.80–5.10)
RDW: 12.6 % (ref 11.0–15.0)
Total Lymphocyte: 22.2 %
WBC: 4.5 10*3/uL (ref 3.8–10.8)

## 2020-04-12 LAB — COMPLETE METABOLIC PANEL WITH GFR
AG Ratio: 1.7 (calc) (ref 1.0–2.5)
ALT: 9 U/L (ref 6–29)
AST: 20 U/L (ref 10–35)
Albumin: 3.8 g/dL (ref 3.6–5.1)
Alkaline phosphatase (APISO): 63 U/L (ref 37–153)
BUN/Creatinine Ratio: 19 (calc) (ref 6–22)
BUN: 20 mg/dL (ref 7–25)
CO2: 25 mmol/L (ref 20–32)
Calcium: 9.6 mg/dL (ref 8.6–10.4)
Chloride: 103 mmol/L (ref 98–110)
Creat: 1.06 mg/dL — ABNORMAL HIGH (ref 0.60–0.88)
GFR, Est African American: 54 mL/min/{1.73_m2} — ABNORMAL LOW (ref >=60)
GFR, Est Non African American: 46 mL/min/{1.73_m2} — ABNORMAL LOW (ref >=60)
Globulin: 2.2 g/dL (calc) (ref 1.9–3.7)
Glucose, Bld: 102 mg/dL — ABNORMAL HIGH (ref 65–99)
Potassium: 5.8 mmol/L — ABNORMAL HIGH (ref 3.5–5.3)
Sodium: 135 mmol/L (ref 135–146)
Total Bilirubin: 0.4 mg/dL (ref 0.2–1.2)
Total Protein: 6 g/dL — ABNORMAL LOW (ref 6.1–8.1)

## 2020-04-12 LAB — LIPID PANEL
Cholesterol: 170 mg/dL (ref <200)
HDL: 64 mg/dL (ref >=50)
LDL Cholesterol (Calc): 86 mg/dL (calc)
Non-HDL Cholesterol (Calc): 106 mg/dL (calc) (ref <130)
Total CHOL/HDL Ratio: 2.7 (calc) (ref <5.0)
Triglycerides: 102 mg/dL (ref <150)

## 2020-04-12 LAB — MAGNESIUM: Magnesium: 1.8 mg/dL (ref 1.5–2.5)

## 2020-04-12 LAB — TSH: TSH: 6.89 mIU/L — ABNORMAL HIGH (ref 0.40–4.50)

## 2020-04-12 MED ORDER — LEVOTHYROXINE SODIUM 50 MCG PO TABS: ORAL_TABLET | ORAL | 1 refills | Status: DC

## 2020-05-15 ENCOUNTER — Ambulatory Visit: Payer: Medicare Other

## 2020-05-16 ENCOUNTER — Other Ambulatory Visit: Payer: Self-pay

## 2020-05-16 ENCOUNTER — Ambulatory Visit (INDEPENDENT_AMBULATORY_CARE_PROVIDER_SITE_OTHER): Payer: Medicare Other

## 2020-05-16 DIAGNOSIS — E039 Hypothyroidism, unspecified: Secondary | ICD-10-CM

## 2020-05-16 LAB — TSH: TSH: 6.65 mIU/L — ABNORMAL HIGH (ref 0.40–4.50)

## 2020-06-12 ENCOUNTER — Ambulatory Visit (INDEPENDENT_AMBULATORY_CARE_PROVIDER_SITE_OTHER): Payer: Medicare Other

## 2020-06-12 ENCOUNTER — Other Ambulatory Visit: Payer: Self-pay

## 2020-06-12 DIAGNOSIS — E039 Hypothyroidism, unspecified: Secondary | ICD-10-CM

## 2020-06-12 LAB — TSH: TSH: 6.94 mIU/L — ABNORMAL HIGH (ref 0.40–4.50)

## 2020-06-12 NOTE — Progress Notes (Signed)
B12 GIVEN IN  Left ARM.  ORDERS released twice by [facility] & that 2nd order was cancelled & LAB was informed of the error as well.  Vitals entered into chart

## 2020-06-13 ENCOUNTER — Other Ambulatory Visit: Payer: Self-pay | Admitting: Adult Health

## 2020-06-13 DIAGNOSIS — E039 Hypothyroidism, unspecified: Secondary | ICD-10-CM

## 2020-06-13 MED ORDER — LEVOTHYROXINE SODIUM 100 MCG PO TABS: ORAL_TABLET | ORAL | 1 refills | Status: DC

## 2020-06-13 NOTE — Progress Notes (Signed)
Patient's daughter is aware of her lab results. Patient's daughter feels very confident that Ms. Donaldson is taking her thyroid medication as directed. She denies that the patient takes any biotin. Since July 15th, the patient has been doing 2 tabs (5 mcg's) of Levothyroxine 5 days a week and 1.5 tabs on Monday and Thursday. She also takes her medication in the morning with water 30 minutes prior to eating.   -Shelby Bradley

## 2020-06-29 ENCOUNTER — Other Ambulatory Visit: Payer: Self-pay | Admitting: Adult Health

## 2020-07-16 ENCOUNTER — Encounter: Payer: Self-pay | Admitting: Physician Assistant

## 2020-07-16 ENCOUNTER — Ambulatory Visit (INDEPENDENT_AMBULATORY_CARE_PROVIDER_SITE_OTHER): Payer: Medicare Other | Admitting: Physician Assistant

## 2020-07-16 ENCOUNTER — Other Ambulatory Visit: Payer: Self-pay

## 2020-07-16 VITALS — BP 118/64 | HR 71 | Temp 97.0°F | Wt 99.0 lb

## 2020-07-16 DIAGNOSIS — E782 Mixed hyperlipidemia: Secondary | ICD-10-CM | POA: Diagnosis not present

## 2020-07-16 DIAGNOSIS — J449 Chronic obstructive pulmonary disease, unspecified: Secondary | ICD-10-CM | POA: Diagnosis not present

## 2020-07-16 DIAGNOSIS — E441 Mild protein-calorie malnutrition: Secondary | ICD-10-CM

## 2020-07-16 DIAGNOSIS — D508 Other iron deficiency anemias: Secondary | ICD-10-CM

## 2020-07-16 DIAGNOSIS — D649 Anemia, unspecified: Secondary | ICD-10-CM | POA: Diagnosis not present

## 2020-07-16 DIAGNOSIS — I10 Essential (primary) hypertension: Secondary | ICD-10-CM | POA: Diagnosis not present

## 2020-07-16 DIAGNOSIS — D518 Other vitamin B12 deficiency anemias: Secondary | ICD-10-CM

## 2020-07-16 DIAGNOSIS — E039 Hypothyroidism, unspecified: Secondary | ICD-10-CM

## 2020-07-16 NOTE — Progress Notes (Signed)
3 month follow up   Assessment:   Essential hypertension - continue medications, DASH diet, exercise and monitor at home. Call if greater than 130/80.  -     CBC with Differential/Platelet -     CMP/GFR -     TSH  Chronic obstructive pulmonary disease, unspecified COPD type ([place]) Per CXR; Avoid triggers, continue allergy pill  Mixed hyperlipidemia -decrease fatty foods, increase activity.  -     Lipid panel  Hypothyroidism, unspecified type Hypothyroidism-check TSH level, continue medications the same, reminded to take on an empty stomach 30-13mns before food.  -     TSH  Malnutrition of mild degree (HCC) Continue protein/weight gain/ensure -     CMP/GFR  Other abnormal glucose Monitor  Medication management -     Magnesium  Anemia due to vitamin B12 deficiency, unspecified B12 deficiency type Continue injections  Vitamin D deficiency Continue supplement  Anemia, unspecified type check CBC, recent good iron/B12, has been stable   Gastroesophageal reflux disease, esophagitis presence not specified Continue PPI/H2 blocker, diet discussed  BMI <19 reivewed high protein diet;   Over 30 minutes of exam, counseling, chart review and critical decision making was performed Future Appointments  Date Time Provider DMucarabones 09/25/2020  2:00 PM MGarnet Sierras NP [facility] None     Subjective:  Shelby Bradley a 86y.o. female who presents for 3 month follow up.   She has chronic hearing loss in left ear from mastoid sx when she was younger, wears bilateral hearing aids.   Follows up with Dr. GKaty Fitchregularly, and he sent her to Dr. MRodman Keyfor mac degeneration, was getting injections but was discharged. Will continue to follow with Dr. GKaty Fitch   She does still drive but does not drive at night.  She takes amitriptyline 1/2 pill at night for sleep.  She will take xanax 0.243mocc during the day for anxiety, will take 1/2 at least once a week.   On  omeprazole 40 mg nightly for GERD and well controlled sx, remote hx of esophageal strictures requiring dilation.   BMI is Body mass index is 17.26 kg/m., she is working on diet and exercise, she is working on weight gain/being stable. She is not taking ensure but trying to increase protein in diet.  Wt Readings from Last 3 Encounters:  07/16/20 99 lb (44.9 kg)  06/12/20 97 lb 6.4 oz (44.2 kg)  05/16/20 96 lb 12.8 oz (43.9 kg)   Her blood pressure has been controlled at home, today their BP is BP: 118/64  She does not workout. She denies chest pain, shortness of breath, dizziness.   She is not on cholesterol medication and denies myalgias. Her cholesterol is at goal. The cholesterol last visit was:   Lab Results  Component Value Date   CHOL 170 04/11/2020   HDL 64 04/11/2020   LDLCALC 86 04/11/2020   TRIG 102 04/11/2020   CHOLHDL 2.7 04/11/2020   She does not have diabetes, has CKD due to HTN and age. Last GFR: Lab Results  Component Value Date   GFRNONAA 46 (L) 04/11/2020   Patient is on Vitamin D supplement.   Lab Results  Component Value Date   VD25OH 88433/06/2020     She is on thyroid medication. Her medication was changed last visit,  To 10077mdaily, not on biotin.  Lab Results  Component Value Date   TSH 6.94 (H) 06/12/2020   She has chronic anemia; on B12 supplement injections  and iron supplement, typically in hgb 9-10 range since 2017 on review -  CBC Latest Ref Rng & Units 04/11/2020 03/06/2020 01/09/2020  WBC 3.8 - 10.8 Thousand/uL 4.5 4.9 4.2  Hemoglobin 11.7 - 15.5 g/dL 9.6(L) 9.5(L) 9.5(L)  Hematocrit 35 - 45 % 29.6(L) 29.4(L) 28.8(L)  Platelets 140 - 400 Thousand/uL 204 230 209   Lab Results  Component Value Date   VITAMINB12 >2,000 (H) 03/06/2020   Lab Results  Component Value Date   IRON 51 11/07/2019   TIBC 209 (L) 11/07/2019   FERRITIN 75 09/13/2019    Medication Review:  Current Outpatient Medications (Endocrine & Metabolic):    levothyroxine  (SYNTHROID) 100 MCG tablet, Take 1 tablet daily as directed on an empty stomach with only water for 30 minutes & no Antacid meds, Calcium or Magnesium for 4 hours & avoid Biotin     Current Outpatient Medications (Hematological):    cyanocobalamin (,VITAMIN B-12,) 1000 MCG/ML injection, INJECT 1 MILLILITER INTO THE SKIN EVERY 30 DAYS   ferrous sulfate 325 (65 FE) MG tablet, Take 325 mg by mouth daily with breakfast.  Current Outpatient Medications (Other):    ALPRAZolam (XANAX) 0.25 MG tablet, Take 1/2 to 1 tablet    at Bedtime   ONLY       if needed for Sleep & please try to limit to 5 days /week to avoid Addiction & Dementia   amitriptyline (ELAVIL) 50 MG tablet, TAKE 1 TABLET BY MOUTH EVERYDAY AT BEDTIME   Cholecalciferol (VITAMIN D-3) 5000 UNITS TABS, Take 5,000 Units by mouth daily.   gabapentin (NEURONTIN) 100 MG capsule, Take 1 capsule 3 x / day for Pain   omeprazole (PRILOSEC) 40 MG capsule, Take 1 capsule Daily to Prevent Indigestion & HeartburnT   Probiotic Product (RESTORA) CAPS, Take 1 capsule Daily   vitamin C (ASCORBIC ACID) 500 MG tablet, Take 500 mg by mouth daily.   Allergies  Allergen Reactions   Norvasc [Amlodipine Besylate]    Sulfa Antibiotics     Current Problems (verified) Patient Active Problem List   Diagnosis Date Noted   Stage 3 chronic kidney disease 09/12/2019   Malnutrition of mild degree ([place]) 11/18/2016   COPD (chronic obstructive pulmonary disease) ([place]) 07/10/2016   Hypothyroidism 06/28/2015   Macular degeneration 06/28/2015   Lower back pain 06/28/2015   Medication management 12/05/2014   Hypertension    Hyperlipidemia, mixed    B12 deficiency anemia    Anemia    Gastroesophageal reflux disease    Vitamin D deficiency    Abnormal glucose    Patient Care Team: Unk Pinto, MD as PCP - General (Internal Medicine) Laurence Spates, MD (Inactive) as Consulting Physician (Gastroenterology) Berenice Primas, MD as  Referring Physician (Orthopedic Surgery) Warden Fillers, MD as Consulting Physician (Optometry)  SURGICAL HISTORY She  has a past surgical history that includes Knee arthroscopy (Left); Cataract extraction; Cholecystectomy (01/18/2019); and Cholecystectomy (N/A, 01/18/2019). FAMILY HISTORY Her family history includes COPD in her father; Heart attack in her brother and brother; Hypertension in her mother. SOCIAL HISTORY She  reports that she has never smoked. She has never used smokeless tobacco. She reports that she does not drink alcohol and does not use drugs.  Review of Systems  Constitutional: Negative for chills, fever and malaise/fatigue.  HENT: Negative for congestion, ear pain and sore throat.   Eyes: Negative.   Respiratory: Negative for cough, shortness of breath and wheezing.   Cardiovascular: Negative for chest pain, palpitations and leg swelling.  Gastrointestinal:  Negative for abdominal pain, blood in stool, constipation, diarrhea, heartburn and melena.  Genitourinary: Negative.   Skin: Negative.   Neurological: Negative for dizziness, sensory change, loss of consciousness and headaches.  Psychiatric/Behavioral: Negative for depression. The patient is not nervous/anxious and does not have insomnia.      Objective:     Today's Vitals   07/16/20 1444  BP: 118/64  Pulse: 71  Temp: (!) 97 F (36.1 C)  SpO2: 95%  Weight: 99 lb (44.9 kg)   Body mass index is 17.26 kg/m.  General appearance: alert, no distress, WD thin frail elderly female HEENT: Left eye ptosis, normocephalic, sclerae anicteric, TM pearly on left, right TM white/scarred or absent TM, bil hearing aids nares patent, no discharge or erythema, pharynx normal Oral cavity: MMM, no lesions Neck: supple, no lymphadenopathy, no thyromegaly, no masses Heart: RRR, normal S1, S2, no murmurs Lungs: CTA bilaterally, no wheezes, rhonchi, or rales Abdomen: +bs, soft, non tender, non distended, no masses, no  hepatomegaly, no splenomegaly Musculoskeletal: nontender, no swelling, no obvious deformity, mild- moderate cervical kyphosis Extremities: no edema, no cyanosis, no clubbing Pulses: 2+ symmetric, upper and lower extremities, normal cap refill Neurological: alert, oriented x 3, CN2-12 intact, strength normal upper extremities and lower extremities, sensation normal throughout, , no cerebellar signs, gait slow steady  Psychiatric: normal affect, behavior normal, pleasant     Vicie Mutters, PA-C   07/16/2020

## 2020-07-19 LAB — CBC WITH DIFFERENTIAL/PLATELET
Absolute Monocytes: 510 cells/uL (ref 200–950)
Basophils Absolute: 41 cells/uL (ref 0–200)
Basophils Relative: 0.7 %
Eosinophils Absolute: 41 cells/uL (ref 15–500)
Eosinophils Relative: 0.7 %
HCT: 28.4 % — ABNORMAL LOW (ref 35.0–45.0)
Hemoglobin: 9.3 g/dL — ABNORMAL LOW (ref 11.7–15.5)
Lymphs Abs: 1288 cells/uL (ref 850–3900)
MCH: 30 pg (ref 27.0–33.0)
MCHC: 32.7 g/dL (ref 32.0–36.0)
MCV: 91.6 fL (ref 80.0–100.0)
MPV: 8.8 fL (ref 7.5–12.5)
Monocytes Relative: 8.8 %
Neutro Abs: 3921 cells/uL (ref 1500–7800)
Neutrophils Relative %: 67.6 %
Platelets: 262 10*3/uL (ref 140–400)
RBC: 3.1 10*6/uL — ABNORMAL LOW (ref 3.80–5.10)
RDW: 13.6 % (ref 11.0–15.0)
Total Lymphocyte: 22.2 %
WBC: 5.8 10*3/uL (ref 3.8–10.8)

## 2020-07-19 LAB — COMPLETE METABOLIC PANEL WITH GFR
AG Ratio: 1.4 (calc) (ref 1.0–2.5)
ALT: 22 U/L (ref 6–29)
AST: 26 U/L (ref 10–35)
Albumin: 3.6 g/dL (ref 3.6–5.1)
Alkaline phosphatase (APISO): 145 U/L (ref 37–153)
BUN/Creatinine Ratio: 20 (calc) (ref 6–22)
BUN: 19 mg/dL (ref 7–25)
CO2: 25 mmol/L (ref 20–32)
Calcium: 9 mg/dL (ref 8.6–10.4)
Chloride: 104 mmol/L (ref 98–110)
Creat: 0.95 mg/dL — ABNORMAL HIGH (ref 0.60–0.88)
GFR, Est African American: 61 mL/min/{1.73_m2} (ref >=60)
GFR, Est Non African American: 53 mL/min/{1.73_m2} — ABNORMAL LOW (ref >=60)
Globulin: 2.5 g/dL (calc) (ref 1.9–3.7)
Glucose, Bld: 97 mg/dL (ref 65–99)
Potassium: 4.8 mmol/L (ref 3.5–5.3)
Sodium: 136 mmol/L (ref 135–146)
Total Bilirubin: 0.4 mg/dL (ref 0.2–1.2)
Total Protein: 6.1 g/dL (ref 6.1–8.1)

## 2020-07-19 LAB — TSH: TSH: 6.07 mIU/L — ABNORMAL HIGH (ref 0.40–4.50)

## 2020-07-19 LAB — TEST AUTHORIZATION

## 2020-07-19 LAB — FERRITIN: Ferritin: 148 ng/mL (ref 16–288)

## 2020-07-19 LAB — IRON: Iron: 39 ug/dL — ABNORMAL LOW (ref 45–160)

## 2020-07-19 NOTE — Addendum Note (Signed)
Addended by: Quentin Mulling R on: 07/19/2020 08:55 AM   Modules accepted: Orders

## 2020-07-25 ENCOUNTER — Telehealth: Payer: Self-pay | Admitting: Physician Assistant

## 2020-07-25 NOTE — Telephone Encounter (Signed)
Shelby Bradley called to request the [facility] referral be put off until after next follow up. Please advise if this is ok.  Please let [name] know either way.

## 2020-07-25 NOTE — Telephone Encounter (Signed)
Yes, Should be fine to do

## 2020-07-28 ENCOUNTER — Other Ambulatory Visit: Payer: Self-pay | Admitting: Internal Medicine

## 2020-08-11 ENCOUNTER — Other Ambulatory Visit: Payer: Self-pay | Admitting: Internal Medicine

## 2020-08-13 ENCOUNTER — Ambulatory Visit (INDEPENDENT_AMBULATORY_CARE_PROVIDER_SITE_OTHER): Payer: Medicare Other

## 2020-08-13 ENCOUNTER — Emergency Department (HOSPITAL_COMMUNITY): Payer: Medicare Other

## 2020-08-13 ENCOUNTER — Other Ambulatory Visit: Payer: Self-pay

## 2020-08-13 ENCOUNTER — Inpatient Hospital Stay (HOSPITAL_COMMUNITY)
Admission: EM | Admit: 2020-08-13 | Discharge: 2020-08-14 | DRG: 641 | Disposition: A | Discharge status: Home Health | Payer: Medicare Other | Attending: Internal Medicine | Admitting: Internal Medicine

## 2020-08-13 ENCOUNTER — Encounter (HOSPITAL_COMMUNITY): Payer: Self-pay | Admitting: Emergency Medicine

## 2020-08-13 DIAGNOSIS — N183 Chronic kidney disease, stage 3 unspecified: Secondary | ICD-10-CM | POA: Diagnosis present

## 2020-08-13 DIAGNOSIS — Z79890 Hormone replacement therapy (postmenopausal): Secondary | ICD-10-CM | POA: Diagnosis not present

## 2020-08-13 DIAGNOSIS — R7303 Prediabetes: Secondary | ICD-10-CM | POA: Diagnosis present

## 2020-08-13 DIAGNOSIS — R030 Elevated blood-pressure reading, without diagnosis of hypertension: Secondary | ICD-10-CM | POA: Diagnosis not present

## 2020-08-13 DIAGNOSIS — J90 Pleural effusion, not elsewhere classified: Secondary | ICD-10-CM | POA: Diagnosis not present

## 2020-08-13 DIAGNOSIS — J449 Chronic obstructive pulmonary disease, unspecified: Secondary | ICD-10-CM | POA: Diagnosis present

## 2020-08-13 DIAGNOSIS — Z20822 Contact with and (suspected) exposure to covid-19: Secondary | ICD-10-CM | POA: Diagnosis present

## 2020-08-13 DIAGNOSIS — N179 Acute kidney failure, unspecified: Secondary | ICD-10-CM | POA: Diagnosis not present

## 2020-08-13 DIAGNOSIS — K219 Gastro-esophageal reflux disease without esophagitis: Secondary | ICD-10-CM | POA: Diagnosis not present

## 2020-08-13 DIAGNOSIS — E871 Hypo-osmolality and hyponatremia: Secondary | ICD-10-CM | POA: Diagnosis present

## 2020-08-13 DIAGNOSIS — D649 Anemia, unspecified: Secondary | ICD-10-CM | POA: Diagnosis present

## 2020-08-13 DIAGNOSIS — D508 Other iron deficiency anemias: Secondary | ICD-10-CM | POA: Diagnosis not present

## 2020-08-13 DIAGNOSIS — E860 Dehydration: Secondary | ICD-10-CM | POA: Diagnosis not present

## 2020-08-13 DIAGNOSIS — E785 Hyperlipidemia, unspecified: Secondary | ICD-10-CM | POA: Diagnosis not present

## 2020-08-13 DIAGNOSIS — E875 Hyperkalemia: Principal | ICD-10-CM | POA: Diagnosis present

## 2020-08-13 DIAGNOSIS — Z66 Do not resuscitate: Secondary | ICD-10-CM | POA: Diagnosis not present

## 2020-08-13 DIAGNOSIS — Z79899 Other long term (current) drug therapy: Secondary | ICD-10-CM

## 2020-08-13 DIAGNOSIS — F419 Anxiety disorder, unspecified: Secondary | ICD-10-CM | POA: Diagnosis present

## 2020-08-13 DIAGNOSIS — R0781 Pleurodynia: Secondary | ICD-10-CM | POA: Diagnosis present

## 2020-08-13 DIAGNOSIS — E039 Hypothyroidism, unspecified: Secondary | ICD-10-CM | POA: Diagnosis present

## 2020-08-13 DIAGNOSIS — R079 Chest pain, unspecified: Secondary | ICD-10-CM | POA: Diagnosis not present

## 2020-08-13 DIAGNOSIS — I10 Essential (primary) hypertension: Secondary | ICD-10-CM

## 2020-08-13 DIAGNOSIS — R109 Unspecified abdominal pain: Secondary | ICD-10-CM

## 2020-08-13 DIAGNOSIS — D518 Other vitamin B12 deficiency anemias: Secondary | ICD-10-CM | POA: Diagnosis not present

## 2020-08-13 DIAGNOSIS — N1831 Chronic kidney disease, stage 3a: Secondary | ICD-10-CM | POA: Diagnosis present

## 2020-08-13 LAB — BASIC METABOLIC PANEL
Anion gap: 9 (ref 5–15)
BUN: 28 mg/dL — ABNORMAL HIGH (ref 8–23)
CO2: 20 mmol/L — ABNORMAL LOW (ref 22–32)
Calcium: 9.6 mg/dL (ref 8.9–10.3)
Chloride: 99 mmol/L (ref 98–111)
Creatinine, Ser: 1.23 mg/dL — ABNORMAL HIGH (ref 0.44–1.00)
GFR, Estimated: 39 mL/min — ABNORMAL LOW (ref >60)
Glucose, Bld: 112 mg/dL — ABNORMAL HIGH (ref 70–99)
Potassium: 6.9 mmol/L (ref 3.5–5.1)
Sodium: 128 mmol/L — ABNORMAL LOW (ref 135–145)

## 2020-08-13 LAB — CBC
HCT: 30.1 % — ABNORMAL LOW (ref 36.0–46.0)
Hemoglobin: 9.6 g/dL — ABNORMAL LOW (ref 12.0–15.0)
MCH: 30.1 pg (ref 26.0–34.0)
MCHC: 31.9 g/dL (ref 30.0–36.0)
MCV: 94.4 fL (ref 80.0–100.0)
Platelets: 259 10*3/uL (ref 150–400)
RBC: 3.19 MIL/uL — ABNORMAL LOW (ref 3.87–5.11)
RDW: 14.6 % (ref 11.5–15.5)
WBC: 8.4 10*3/uL (ref 4.0–10.5)
nRBC: 0 % (ref 0.0–0.2)

## 2020-08-13 LAB — TROPONIN I (HIGH SENSITIVITY)
Troponin I (High Sensitivity): 9 ng/L (ref <18)
Troponin I (High Sensitivity): 15 ng/L (ref <18)

## 2020-08-13 MED ORDER — SODIUM BICARBONATE 8.4 % IV SOLN
50.0000 meq | Freq: Once | INTRAVENOUS | Status: AC
  Administered 2020-08-13: 50 meq via INTRAVENOUS
  Filled 2020-08-13: qty 50

## 2020-08-13 MED ORDER — ACETAMINOPHEN 500 MG PO TABS
1000.0000 mg | ORAL_TABLET | Freq: Once | ORAL | Status: AC
  Administered 2020-08-13: 1000 mg via ORAL
  Filled 2020-08-13: qty 2

## 2020-08-13 MED ORDER — SODIUM ZIRCONIUM CYCLOSILICATE 10 G PO PACK
10.0000 g | PACK | Freq: Once | ORAL | Status: AC
  Administered 2020-08-13: 10 g via ORAL
  Filled 2020-08-13: qty 1

## 2020-08-13 MED ORDER — INSULIN ASPART 100 UNIT/ML IV SOLN
5.0000 [IU] | Freq: Once | INTRAVENOUS | Status: AC
  Administered 2020-08-13: 5 [IU] via INTRAVENOUS

## 2020-08-13 MED ORDER — ALBUTEROL SULFATE (2.5 MG/3ML) 0.083% IN NEBU
10.0000 mg | INHALATION_SOLUTION | Freq: Once | RESPIRATORY_TRACT | Status: AC
  Administered 2020-08-14: 10 mg via RESPIRATORY_TRACT
  Filled 2020-08-13: qty 12

## 2020-08-13 MED ORDER — DEXTROSE 50 % IV SOLN
1.0000 | Freq: Once | INTRAVENOUS | Status: AC
  Administered 2020-08-13: 50 mL via INTRAVENOUS
  Filled 2020-08-13: qty 50

## 2020-08-13 NOTE — Progress Notes (Signed)
Had to fix Lab orders

## 2020-08-13 NOTE — Progress Notes (Signed)
PATIENT'S care taker reports that she wants LAB bldwork to recheck TSH & IRON.  VITALs entered & lab should be mailed out to patient as well as phone call

## 2020-08-13 NOTE — ED Triage Notes (Signed)
Pt arrives to ED with daughter with a c/o of sob of breath and mid upper back pain pt states this started suddenly about 1 hour ago her breath feel tight and hard to get a deep breath.  Pt was just seen today and pcp and had her flu vaccine and everything checked out ok. Daughter is concerned due to pt has never felt like this before.

## 2020-08-13 NOTE — Addendum Note (Signed)
Addended by: Rodney Booze D on: 08/13/2020 03:56 PM   Modules accepted: Orders

## 2020-08-13 NOTE — ED Provider Notes (Signed)
[facility] EMERGENCY DEPARTMENT Provider Note   CSN: [id number] Arrival date & time: 08/13/20  1805   History Chief Complaint  Patient presents with  . Shortness of Breath    Shelby Bradley is a 86 y.o. female.  The history is provided by the patient and a relative.  Shortness of Breath She has history of hypertension, prediabetes, hyperlipidemia, chronic kidney disease, COPD and comes in today with several complaints.  She was complaining of severe pain in the right subcostal area, some shortness of breath, and generalized weakness.  She was in her usual state of health yesterday.  While she has been in the emergency department, she got a dose of acetaminophen, and she feels that she is back to her baseline.  She denies fever, chills, sweats.  She denies cough.  There has been no nausea or vomiting or diarrhea.  She did receive a flu shot and a B12 injection today.  She has been vaccinated against COVID-19.  Past Medical History:  Diagnosis Date  . Acute cholecystitis 01/18/2019  . Allergy   . Anemia   . Anxiety   . DDD (degenerative disc disease), lumbar   . Depression   . GERD (gastroesophageal reflux disease)   . Hyperlipidemia   . Hypertension   . OA (osteoarthritis) of knee    right knee  . Pre-diabetes   . Thyroid disease   . Vitamin D deficiency     Patient Active Problem List   Diagnosis Date Noted  . Stage 3 chronic kidney disease (HCC) 09/12/2019  . Malnutrition of mild degree (HCC) 11/18/2016  . COPD (chronic obstructive pulmonary disease) (HCC) 07/10/2016  . Hypothyroidism 06/28/2015  . Macular degeneration 06/28/2015  . Lower back pain 06/28/2015  . Medication management 12/05/2014  . Hypertension   . Hyperlipidemia, mixed   . B12 deficiency anemia   . Anemia   . Gastroesophageal reflux disease   . Vitamin D deficiency   . Abnormal glucose     Past Surgical History:  Procedure Laterality Date  . CATARACT EXTRACTION     both    . CHOLECYSTECTOMY  01/18/2019  . CHOLECYSTECTOMY N/A 01/18/2019   Procedure: LAPAROSCOPIC CHOLECYSTECTOMY WITH INTRAOPERATIVE CHOLANGIOGRAM;  Surgeon: Berna Bue, MD;  Location: [facility] OR;  Service: General;  Laterality: N/A;  . KNEE ARTHROSCOPY Left      OB History   No obstetric history on file.     Family History  Problem Relation Age of Onset  . Hypertension Mother   . COPD Father   . Heart attack Brother   . Heart attack Brother     Social History   Tobacco Use  . Smoking status: Never Smoker  . Smokeless tobacco: Never Used  Vaping Use  . Vaping Use: Never used  Substance Use Topics  . Alcohol use: No  . Drug use: No    Home Medications Prior to Admission medications   Medication Sig Start Date End Date Taking? Authorizing Provider  ALPRAZolam (XANAX) 0.25 MG tablet TAKE 1/2 TO 1 TABLET AT BEDTIME ONLY IF NEEDED FOR SLEEP & PLEASE TRY TO LIMIT TO 5 DAYS /WEEK TO AVOID ADDICTION & DEMENTIA 07/29/20   Lucky Cowboy, MD  amitriptyline (ELAVIL) 50 MG tablet TAKE 1 TABLET BY MOUTH EVERYDAY AT BEDTIME 02/01/20   Quentin Mulling, PA-C  Cholecalciferol (VITAMIN D-3) 5000 UNITS TABS Take 5,000 Units by mouth daily.    [provider]  cyanocobalamin (,VITAMIN B-12,) 1000 MCG/ML injection INJECT 1 MILLILITER  INTO THE SKIN EVERY 30 DAYS 12/01/19   Lucky CowboyMcKeown, William, MD  ferrous sulfate 325 (65 FE) MG tablet Take 325 mg by mouth daily with breakfast.    [provider]  gabapentin (NEURONTIN) 100 MG capsule Take 1 capsule 3 x / day for Pain 11/07/19   Lucky CowboyMcKeown, William, MD  levothyroxine (SYNTHROID) 100 MCG tablet Take 1 tablet daily as directed on an empty stomach with only water for 30 minutes & no Antacid meds, Calcium or Magnesium for 4 hours & avoid Biotin 06/13/20   Judd Gaudierorbett, Ashley, NP  omeprazole (PRILOSEC) 40 MG capsule TAKE 1 CAPSULE DAILY TO PREVENT INDIGESTION & HEARTBURNT 08/11/20   Judd Gaudierorbett, Ashley, NP  Probiotic Product (RESTORA) CAPS Take 1 capsule  Daily 11/22/19   Lucky CowboyMcKeown, William, MD  vitamin C (ASCORBIC ACID) 500 MG tablet Take 500 mg by mouth daily.    [provider]    Allergies    Norvasc [amlodipine besylate] and Sulfa antibiotics  Review of Systems   Review of Systems  Respiratory: Positive for shortness of breath.   All other systems reviewed and are negative.   Physical Exam Updated Vital Signs BP (!) 197/80   Pulse 92   Temp (!) 97.4 F (36.3 C) (Oral)   Resp 18   SpO2 98%   Physical Exam Vitals and nursing note reviewed.   86 year old female, resting comfortably and in no acute distress. Vital signs are significant for elevated blood pressure. Oxygen saturation is 98%, which is normal. Head is normocephalic and atraumatic. PERRLA, EOMI. Oropharynx is clear. Neck is nontender and supple without adenopathy or JVD. Back is nontender and there is no CVA tenderness. Lungs are clear without rales, wheezes, or rhonchi. Chest is nontender. Heart has regular rate and rhythm without murmur. Abdomen is soft, flat, nontender without masses or hepatosplenomegaly and peristalsis is normoactive. Extremities have no cyanosis or edema, full range of motion is present. Skin is warm and dry without rash. Neurologic: Mental status is normal, cranial nerves are intact, there are no motor or sensory deficits.  ED Results / Procedures / Treatments   Labs (all labs ordered are listed, but only abnormal results are displayed) Labs Reviewed  BASIC METABOLIC PANEL - Abnormal; Notable for the following components:      Result Value   Sodium 128 (*)    Potassium 6.9 (*)    CO2 20 (*)    Glucose, Bld 112 (*)    BUN 28 (*)    Creatinine, Ser 1.23 (*)    GFR, Estimated 39 (*)    All other components within normal limits  CBC - Abnormal; Notable for the following components:   RBC 3.19 (*)    Hemoglobin 9.6 (*)    HCT 30.1 (*)    All other components within normal limits  TROPONIN I (HIGH SENSITIVITY)  TROPONIN I  (HIGH SENSITIVITY)    EKG EKG Interpretation  Date/Time:  Monday August 13 2020 18:36:15 EDT Ventricular Rate:  85 PR Interval:  192 QRS Duration: 86 QT Interval:  348 QTC Calculation: 414 R Axis:   8 Text Interpretation: Normal sinus rhythm Possible Left atrial enlargement Septal infarct , age undetermined Abnormal ECG When compared with ECG of 01/17/2019, T waves are somewhat taller and peaked, concerning for hyperkalemia Confirmed by Dione BoozeGlick, Dilan Novosad ([id number]) on 08/13/2020 11:19:13 PM   Radiology DG Chest 2 View  Result Date: 08/13/2020 CLINICAL DATA:  Chest pain EXAM: CHEST - 2 VIEW COMPARISON:  01/17/2019 FINDINGS: The lungs  are mildly, symmetrically hyperinflated in keeping with changes of underlying COPD. No superimposed focal pulmonary infiltrate. Mild biapical spine parenchymal scarring is again noted. No pneumothorax or pleural effusion. Cardiac size within normal limits. Pulmonary vascularity is normal. Since the prior examination, there has developed a a moderate to severe wedge compression fracture of T8 with approximately 60% loss of height. There are compression fractures also noted of T5 and T6 with minimal loss of height. There is resultant extension weighted thoracic kyphosis, new since prior examination. A superior endplate fracture of L2 is also incidentally noted with approximately 20% loss of height. These fractures are age indeterminate though there is no significant paravertebral soft tissue swelling noted to indicate an acute fracture. IMPRESSION: Interval development of multiple thoracic compression fractures as well as an L2 superior endplate fracture, age indeterminate. Correlation for point tenderness is recommended. If desired, MRI examination could better age these fractures, particularly if vertebroplasty is to be considered for therapeutic intervention. COPD Electronically Signed   By: Helyn Numbers MD   On: 08/13/2020 19:31    Procedures Procedures  CRITICAL  CARE Performed by: Dione Booze Total critical care time: 60 minutes Critical care time was exclusive of separately billable procedures and treating other patients. Critical care was necessary to treat or prevent imminent or life-threatening deterioration. Critical care was time spent personally by me on the following activities: development of treatment plan with patient and/or surrogate as well as nursing, discussions with consultants, evaluation of patient's response to treatment, examination of patient, obtaining history from patient or surrogate, ordering and performing treatments and interventions, ordering and review of laboratory studies, ordering and review of radiographic studies, pulse oximetry and re-evaluation of patient's condition.  Medications Ordered in ED Medications  LORazepam (ATIVAN) injection 0.5 mg (has no administration in time range)  acetaminophen (TYLENOL) tablet 1,000 mg (1,000 mg Oral Given 08/13/20 2224)  albuterol (PROVENTIL) (2.5 MG/3ML) 0.083% nebulizer solution 10 mg (10 mg Nebulization Given 08/14/20 0001)  insulin aspart (novoLOG) injection 5 Units (5 Units Intravenous Given 08/13/20 2332)    And  dextrose 50 % solution 50 mL (50 mLs Intravenous Given 08/13/20 2332)  sodium bicarbonate injection 50 mEq (50 mEq Intravenous Given 08/13/20 2331)  sodium zirconium cyclosilicate (LOKELMA) packet 10 g (10 g Oral Given 08/13/20 2340)    ED Course  I have reviewed the triage vital signs and the nursing notes.  Pertinent labs & imaging results that were available during my care of the patient were reviewed by me and considered in my medical decision making (see chart for details).  MDM Rules/Calculators/A&P Multiple complaints which appear to have resolved spontaneously.  Work-up has been ordered at triage, and most significant finding was potassium of 6.9 with no report of visible hemolysis.  Also noted are mild hyponatremia which is not felt to be clinically  significant, and creatinine and BUN increased over baseline.  Anemia is present which is unchanged from baseline.  Chest x-ray shows changes of COPD and multiple compression fractures which are new compared with 01/17/2019.  I do not feel the compression fractures are significant as she has absolutely no tenderness to palpation anywhere along the spine.  Cause for hyperkalemia is not clear.  She has only mild renal insufficiency and is on no medications that are typically associated with hyperkalemia.  However, I did discover that she started using a salt substitute over the past month.  That might be a significant contributing factor to her hyperkalemia.  Review of old records  shows she has had mild hyperkalemia on 2 occasions in the past 7 months.  There are some subtle ECG changes of hyperkalemia, so I do believe that it is a real value.  She is given intravenous glucose, insulin, sodium bicarbonate and is also given albuterol via nebulizer.  She had been complaining of some upper abdominal pain, will add hepatic function panel and a lipase to her labs.  Case is discussed with Dr. Sedalia Muta of [facility], who agrees to admit the patient.  Final Clinical Impression(s) / ED Diagnoses Final diagnoses:  Hyperkalemia  Acute kidney injury (nontraumatic) (HCC)  Hyponatremia  Normochromic normocytic anemia  Elevated blood pressure reading with diagnosis of hypertension    Rx / DC Orders ED Discharge Orders    None       Dione Booze, MD 08/14/20 0025

## 2020-08-13 NOTE — Addendum Note (Signed)
Addended by: Rodney Booze D on: 08/13/2020 04:33 PM   Modules accepted: Orders

## 2020-08-14 ENCOUNTER — Inpatient Hospital Stay (HOSPITAL_COMMUNITY): Payer: Medicare Other

## 2020-08-14 DIAGNOSIS — R1011 Right upper quadrant pain: Secondary | ICD-10-CM | POA: Diagnosis not present

## 2020-08-14 DIAGNOSIS — R0789 Other chest pain: Secondary | ICD-10-CM | POA: Diagnosis present

## 2020-08-14 DIAGNOSIS — Z79890 Hormone replacement therapy (postmenopausal): Secondary | ICD-10-CM | POA: Diagnosis not present

## 2020-08-14 DIAGNOSIS — R109 Unspecified abdominal pain: Secondary | ICD-10-CM | POA: Insufficient documentation

## 2020-08-14 DIAGNOSIS — K219 Gastro-esophageal reflux disease without esophagitis: Secondary | ICD-10-CM | POA: Diagnosis present

## 2020-08-14 DIAGNOSIS — D649 Anemia, unspecified: Secondary | ICD-10-CM | POA: Diagnosis present

## 2020-08-14 DIAGNOSIS — N183 Chronic kidney disease, stage 3 unspecified: Secondary | ICD-10-CM | POA: Diagnosis present

## 2020-08-14 DIAGNOSIS — E785 Hyperlipidemia, unspecified: Secondary | ICD-10-CM | POA: Diagnosis present

## 2020-08-14 DIAGNOSIS — E875 Hyperkalemia: Secondary | ICD-10-CM | POA: Diagnosis present

## 2020-08-14 DIAGNOSIS — E039 Hypothyroidism, unspecified: Secondary | ICD-10-CM | POA: Diagnosis present

## 2020-08-14 DIAGNOSIS — N179 Acute kidney failure, unspecified: Secondary | ICD-10-CM | POA: Diagnosis present

## 2020-08-14 DIAGNOSIS — E871 Hypo-osmolality and hyponatremia: Secondary | ICD-10-CM | POA: Diagnosis present

## 2020-08-14 DIAGNOSIS — R0781 Pleurodynia: Secondary | ICD-10-CM

## 2020-08-14 DIAGNOSIS — E860 Dehydration: Secondary | ICD-10-CM | POA: Diagnosis present

## 2020-08-14 DIAGNOSIS — J449 Chronic obstructive pulmonary disease, unspecified: Secondary | ICD-10-CM | POA: Diagnosis present

## 2020-08-14 DIAGNOSIS — R7303 Prediabetes: Secondary | ICD-10-CM | POA: Diagnosis present

## 2020-08-14 DIAGNOSIS — Z79899 Other long term (current) drug therapy: Secondary | ICD-10-CM | POA: Diagnosis not present

## 2020-08-14 DIAGNOSIS — F419 Anxiety disorder, unspecified: Secondary | ICD-10-CM | POA: Diagnosis present

## 2020-08-14 DIAGNOSIS — Z66 Do not resuscitate: Secondary | ICD-10-CM | POA: Diagnosis present

## 2020-08-14 DIAGNOSIS — Z20822 Contact with and (suspected) exposure to covid-19: Secondary | ICD-10-CM | POA: Diagnosis present

## 2020-08-14 LAB — CBC
HCT: 26.6 % — ABNORMAL LOW (ref 36.0–46.0)
Hemoglobin: 8.6 g/dL — ABNORMAL LOW (ref 12.0–15.0)
MCH: 30.3 pg (ref 26.0–34.0)
MCHC: 32.3 g/dL (ref 30.0–36.0)
MCV: 93.7 fL (ref 80.0–100.0)
Platelets: 208 10*3/uL (ref 150–400)
RBC: 2.84 MIL/uL — ABNORMAL LOW (ref 3.87–5.11)
RDW: 14.5 % (ref 11.5–15.5)
WBC: 8.3 10*3/uL (ref 4.0–10.5)
nRBC: 0 % (ref 0.0–0.2)

## 2020-08-14 LAB — HEPATIC FUNCTION PANEL
ALT: 13 U/L (ref 0–44)
AST: 25 U/L (ref 15–41)
Albumin: 3.9 g/dL (ref 3.5–5.0)
Alkaline Phosphatase: 86 U/L (ref 38–126)
Bilirubin, Direct: 0.2 mg/dL (ref 0.0–0.2)
Indirect Bilirubin: 0.4 mg/dL (ref 0.3–0.9)
Total Bilirubin: 0.6 mg/dL (ref 0.3–1.2)
Total Protein: 6.8 g/dL (ref 6.5–8.1)

## 2020-08-14 LAB — BASIC METABOLIC PANEL
Anion gap: 9 (ref 5–15)
Anion gap: 10 (ref 5–15)
BUN: 30 mg/dL — ABNORMAL HIGH (ref 8–23)
BUN: 26 mg/dL — ABNORMAL HIGH (ref 8–23)
CO2: 20 mmol/L — ABNORMAL LOW (ref 22–32)
CO2: 19 mmol/L — ABNORMAL LOW (ref 22–32)
Calcium: 9.7 mg/dL (ref 8.9–10.3)
Calcium: 8.6 mg/dL — ABNORMAL LOW (ref 8.9–10.3)
Chloride: 100 mmol/L (ref 98–111)
Chloride: 102 mmol/L (ref 98–111)
Creatinine, Ser: 1.28 mg/dL — ABNORMAL HIGH (ref 0.44–1.00)
Creatinine, Ser: 1.23 mg/dL — ABNORMAL HIGH (ref 0.44–1.00)
GFR, Estimated: 37 mL/min — ABNORMAL LOW (ref >60)
GFR, Estimated: 39 mL/min — ABNORMAL LOW (ref >60)
Glucose, Bld: 113 mg/dL — ABNORMAL HIGH (ref 70–99)
Glucose, Bld: 137 mg/dL — ABNORMAL HIGH (ref 70–99)
Potassium: 6.8 mmol/L (ref 3.5–5.1)
Potassium: 4.8 mmol/L (ref 3.5–5.1)
Sodium: 129 mmol/L — ABNORMAL LOW (ref 135–145)
Sodium: 131 mmol/L — ABNORMAL LOW (ref 135–145)

## 2020-08-14 LAB — LIPASE, BLOOD: Lipase: 29 U/L (ref 11–51)

## 2020-08-14 LAB — IRON, TOTAL/TOTAL IRON BINDING CAP
%SAT: 15 % (calc) — ABNORMAL LOW (ref 16–45)
Iron: 41 ug/dL — ABNORMAL LOW (ref 45–160)
TIBC: 269 mcg/dL (calc) (ref 250–450)

## 2020-08-14 LAB — TSH: TSH: 3.4 mIU/L (ref 0.40–4.50)

## 2020-08-14 LAB — RESPIRATORY PANEL BY RT PCR (FLU A&B, COVID)
Influenza A by PCR: NEGATIVE
Influenza B by PCR: NEGATIVE
SARS Coronavirus 2 by RT PCR: NEGATIVE

## 2020-08-14 LAB — POTASSIUM: Potassium: 5.2 mmol/L — ABNORMAL HIGH (ref 3.5–5.1)

## 2020-08-14 LAB — MAGNESIUM: Magnesium: 2 mg/dL (ref 1.7–2.4)

## 2020-08-14 MED ORDER — AMITRIPTYLINE HCL 25 MG PO TABS: 50.0000 mg | ORAL_TABLET | Freq: Every day | ORAL | Status: DC

## 2020-08-14 MED ORDER — ENOXAPARIN SODIUM 30 MG/0.3ML ~~LOC~~ SOLN
30.0000 mg | Freq: Every day | SUBCUTANEOUS | Status: DC
  Administered 2020-08-14: 30 mg via SUBCUTANEOUS
  Filled 2020-08-14: qty 0.3

## 2020-08-14 MED ORDER — OXYCODONE HCL ER 10 MG PO T12A: 10.0000 mg | EXTENDED_RELEASE_TABLET | Freq: Two times a day (BID) | ORAL | Status: DC | PRN

## 2020-08-14 MED ORDER — PANTOPRAZOLE SODIUM 40 MG PO TBEC: 40.0000 mg | DELAYED_RELEASE_TABLET | Freq: Every day | ORAL | Status: DC

## 2020-08-14 MED ORDER — ACETAMINOPHEN 325 MG PO TABS
325.0000 mg | ORAL_TABLET | Freq: Four times a day (QID) | ORAL | Status: DC | PRN
  Administered 2020-08-14: 325 mg via ORAL
  Filled 2020-08-14: qty 1

## 2020-08-14 MED ORDER — SODIUM CHLORIDE 0.9 % IV SOLN: INTRAVENOUS | Status: DC

## 2020-08-14 MED ORDER — LEVOTHYROXINE SODIUM 100 MCG PO TABS
100.0000 ug | ORAL_TABLET | Freq: Every day | ORAL | Status: DC
  Administered 2020-08-14: 100 ug via ORAL
  Filled 2020-08-14: qty 1

## 2020-08-14 MED ORDER — AMITRIPTYLINE HCL 25 MG PO TABS
25.0000 mg | ORAL_TABLET | Freq: Every day | ORAL | Status: DC
  Filled 2020-08-14: qty 1

## 2020-08-14 MED ORDER — FENTANYL CITRATE (PF) 100 MCG/2ML IJ SOLN
12.5000 ug | Freq: Once | INTRAMUSCULAR | Status: AC
  Administered 2020-08-14: 12.5 ug via INTRAVENOUS
  Filled 2020-08-14: qty 2

## 2020-08-14 MED ORDER — ALBUTEROL SULFATE (2.5 MG/3ML) 0.083% IN NEBU
10.0000 mg | INHALATION_SOLUTION | Freq: Once | RESPIRATORY_TRACT | Status: AC
  Administered 2020-08-14: 10 mg via RESPIRATORY_TRACT
  Filled 2020-08-14: qty 12

## 2020-08-14 MED ORDER — ALPRAZOLAM 0.25 MG PO TABS
0.2500 mg | ORAL_TABLET | Freq: Every evening | ORAL | Status: DC | PRN
  Administered 2020-08-14: 0.125 mg via ORAL
  Filled 2020-08-14: qty 1

## 2020-08-14 MED ORDER — FENTANYL CITRATE (PF) 100 MCG/2ML IJ SOLN
25.0000 ug | Freq: Once | INTRAMUSCULAR | Status: AC
  Administered 2020-08-14: 25 ug via INTRAVENOUS
  Filled 2020-08-14: qty 2

## 2020-08-14 MED ORDER — LORAZEPAM 2 MG/ML IJ SOLN
0.5000 mg | Freq: Once | INTRAMUSCULAR | Status: AC
  Administered 2020-08-14: 0.5 mg via INTRAVENOUS
  Filled 2020-08-14: qty 1

## 2020-08-14 NOTE — Discharge Summary (Signed)
Physician Discharge Summary  Shelby Bradley DJS:[id number] DOB: 07/06/1928 DOA: 08/13/2020  PCP: Lucky Cowboy, MD  Admit date: 08/13/2020 Discharge date: 08/14/2020  Admitted From: Home Disposition: Home  Recommendations for Outpatient Follow-up:  1. Follow up with PCP in 1-2 weeks 2. Follow-up BMP by the end of the week to follow potassium levels 3. Call PCP if worsening of back pain, spasming or weakness  Home Health: PT Equipment/Devices: None  Discharge Condition: Stable CODE STATUS: DNR Diet recommendation: Regular diet with postmeal protein shakes as discussed  Brief/Interim Summary: Shelby Bradley a 86 y.o.femalewith medical history significant forhypothyroid, insomnia, anxiety, presents with chief concern of back pain. Of note, she slipped off her bed approximately 1 month prior, no head injury.  She endorses acute onset of right back pain, no recent trauma beyond the bed incident about one month prior. She was able to ambulate and perform ADLs. CXR in ED was negative for flail chest, positive for multiple thoracic compression fracture, L2 superior endplate fracture, age indeterminate, MRI examination would be better to assess.Discussed with ED provider. Admit for hyperkalemia.  Patient admitted as above with acute symptomatic hyperkalemia.  Patient had a fall previously but does not appear to be related to L2 superior endplate fracture, physical exam benign today, patient describes her previous pain as cramping and squeezing in nature along her back muscles most notably around the right shoulder blade.  Certainly reasonable that her cramping intractable back pain was in the setting of hyperkalemia given her small stature and likely somewhat dehydrated at baseline.  At this time patient is not wanting any further work-up or evaluation as we discussed if her MRI was concerning for fracture she might require kyphoplasty or further procedure which she would not want, thus  we decided that imaging was unnecessary and discontinued.  Close follow-up with Dr. Marvel Plan her PCP as discussed later this week for repeat BMP, discontinue low-sodium table salt which is likely the etiology for her hyperkalemia and otherwise her diet should be liberalized given her low BMI and malnourished state.   Discharge Diagnoses:  Principal Problem:   Hyperkalemia Active Problems:   Normochromic normocytic anemia   Hypothyroidism   Stage 3 chronic kidney disease (HCC)   Rib pain on right side    Discharge Instructions  Discharge Instructions    Diet general   Complete by: As directed    Regular diet with postmeal protein shakes as discussed   Increase activity slowly   Complete by: As directed      Allergies as of 08/14/2020      Reactions   Norvasc [amlodipine Besylate]    Sulfa Antibiotics       Medication List    STOP taking these medications   gabapentin 100 MG capsule Commonly known as: Neurontin     TAKE these medications   ALPRAZolam 0.25 MG tablet Commonly known as: XANAX TAKE 1/2 TO 1 TABLET AT BEDTIME ONLY IF NEEDED FOR SLEEP & PLEASE TRY TO LIMIT TO 5 DAYS /WEEK TO AVOID ADDICTION & DEMENTIA What changed: See the new instructions.   amitriptyline 50 MG tablet Commonly known as: ELAVIL Take 25 mg by mouth at bedtime.   cyanocobalamin 1000 MCG/ML injection Commonly known as: (VITAMIN B-12) INJECT 1 MILLILITER INTO THE SKIN EVERY 30 DAYS What changed:   how much to take  how to take this  when to take this   ferrous sulfate 325 (65 FE) MG tablet Take 325 mg by mouth 2 (two) times  daily with a meal.   levothyroxine 100 MCG tablet Commonly known as: Synthroid Take 1 tablet daily as directed on an empty stomach with only water for 30 minutes & no Antacid meds, Calcium or Magnesium for 4 hours & avoid Biotin What changed:   how much to take  how to take this  when to take this  additional instructions   omeprazole 40 MG  capsule Commonly known as: PRILOSEC TAKE 1 CAPSULE DAILY TO PREVENT INDIGESTION & HEARTBURNT What changed: See the new instructions.   Restora Caps Take 1 capsule Daily   vitamin C 500 MG tablet Commonly known as: ASCORBIC ACID Take 500 mg by mouth daily.   Vitamin D-3 125 MCG (5000 UT) Tabs Take 5,000 Units by mouth daily.       Allergies  Allergen Reactions  . Norvasc [Amlodipine Besylate]   . Sulfa Antibiotics     Consultations:  None   Procedures/Studies: DG Chest 2 View  Result Date: 08/13/2020 CLINICAL DATA:  Chest pain EXAM: CHEST - 2 VIEW COMPARISON:  01/17/2019 FINDINGS: The lungs are mildly, symmetrically hyperinflated in keeping with changes of underlying COPD. No superimposed focal pulmonary infiltrate. Mild biapical spine parenchymal scarring is again noted. No pneumothorax or pleural effusion. Cardiac size within normal limits. Pulmonary vascularity is normal. Since the prior examination, there has developed a a moderate to severe wedge compression fracture of T8 with approximately 60% loss of height. There are compression fractures also noted of T5 and T6 with minimal loss of height. There is resultant extension weighted thoracic kyphosis, new since prior examination. A superior endplate fracture of L2 is also incidentally noted with approximately 20% loss of height. These fractures are age indeterminate though there is no significant paravertebral soft tissue swelling noted to indicate an acute fracture. IMPRESSION: Interval development of multiple thoracic compression fractures as well as an L2 superior endplate fracture, age indeterminate. Correlation for point tenderness is recommended. If desired, MRI examination could better age these fractures, particularly if vertebroplasty is to be considered for therapeutic intervention. COPD Electronically Signed   By: Helyn NumbersAshesh  Parikh MD   On: 08/13/2020 19:31   DG Abd 1 View  Result Date: 08/14/2020 CLINICAL DATA:   Abdominal pain EXAM: ABDOMEN - 1 VIEW COMPARISON:  None. FINDINGS: Single view radiograph of the abdomen demonstrates a normal abdominal gas pattern. High density material is seen with decompressed gastric lumen. No gross free intraperitoneal gas. No organomegaly. Cholecystectomy clips noted within the right upper quadrant. IMPRESSION: Normal abdominal gas pattern. Electronically Signed   By: Helyn NumbersAshesh  Parikh MD   On: 08/14/2020 01:27      Subjective: No acute issues or events overnight, pain resolved with hyperkalemia, now ambulating with PT OT with minimal support, otherwise requesting discharge home which is certainly reasonable.  Denies weakness, paresthesia, nausea, vomiting, incontinence, diarrhea, constipation, headache, fever, chills, chest pain, shortness of breath.   Discharge Exam: Vitals:   08/14/20 0900 08/14/20 0915  BP: (!) 137/55 (!) 136/58  Pulse: 87 88  Resp: (!) 26 16  Temp:    SpO2: 100% 100%   Vitals:   08/14/20 0830 08/14/20 0845 08/14/20 0900 08/14/20 0915  BP: (!) 139/56 (!) 135/56 (!) 137/55 (!) 136/58  Pulse: 86 92 87 88  Resp: (!) 21 17 (!) 26 16  Temp:      TempSrc:      SpO2: 100% 100% 100% 100%  Weight:      Height:        General:  Elderly cachectic female resting comfortably in bed, minimally distressed about possible need for imaging and procedure. HEENT: Sclerae nonicteric, noninjected.  Extraocular movements intact bilaterally. Neck:  Without mass or deformity.  Trachea is midline. Lungs:  Clear to auscultate bilaterally without rhonchi, wheeze, or rales. Heart:  Regular rate and rhythm.  Without murmurs, rubs, or gallops. Abdomen:  Soft, nontender, nondistended.  Without guarding or rebound. Extremities: Without cyanosis, clubbing, edema, or obvious deformity. Vascular:  Dorsalis pedis and posterior tibial pulses palpable bilaterally. Skin:  Warm and dry, no erythema, no ulcerations.     The results of significant diagnostics from this  hospitalization (including imaging, microbiology, ancillary and laboratory) are listed below for reference.     Microbiology: Recent Results (from the past 240 hour(s))  Respiratory Panel by RT PCR (Flu A&B, Covid) - Nasopharyngeal Swab     Status: None   Collection Time: 08/13/20 11:50 PM   Specimen: Nasopharyngeal Swab  Result Value Ref Range Status   SARS Coronavirus 2 by RT PCR NEGATIVE NEGATIVE Final    Comment: (NOTE) SARS-CoV-2 target nucleic acids are NOT DETECTED.  The SARS-CoV-2 RNA is generally detectable in upper respiratoy specimens during the acute phase of infection. The lowest concentration of SARS-CoV-2 viral copies this assay can detect is 131 copies/mL. A negative result does not preclude SARS-Cov-2 infection and should not be used as the sole basis for treatment or other patient management decisions. A negative result may occur with  improper specimen collection/handling, submission of specimen other than nasopharyngeal swab, presence of viral mutation(s) within the areas targeted by this assay, and inadequate number of viral copies (<131 copies/mL). A negative result must be combined with clinical observations, patient history, and epidemiological information. The expected result is Negative.  Fact Sheet for Patients:  https://www.moore.com/  Fact Sheet for Healthcare Providers:  https://www.young.biz/  This test is no t yet approved or cleared by the [place] FDA and  has been authorized for detection and/or diagnosis of SARS-CoV-2 by FDA under an Emergency Use Authorization (EUA). This EUA will remain  in effect (meaning this test can be used) for the duration of the COVID-19 declaration under Section 564(b)(1) of the Act, 21 U.S.C. section 360bbb-3(b)(1), unless the authorization is terminated or revoked sooner.     Influenza A by PCR NEGATIVE NEGATIVE Final   Influenza B by PCR NEGATIVE NEGATIVE Final     Comment: (NOTE) The Xpert Xpress SARS-CoV-2/FLU/RSV assay is intended as an aid in  the diagnosis of influenza from Nasopharyngeal swab specimens and  should not be used as a sole basis for treatment. Nasal washings and  aspirates are unacceptable for Xpert Xpress SARS-CoV-2/FLU/RSV  testing.  Fact Sheet for Patients: https://www.moore.com/  Fact Sheet for Healthcare Providers: https://www.young.biz/  This test is not yet approved or cleared by the [place] FDA and  has been authorized for detection and/or diagnosis of SARS-CoV-2 by  FDA under an Emergency Use Authorization (EUA). This EUA will remain  in effect (meaning this test can be used) for the duration of the  Covid-19 declaration under Section 564(b)(1) of the Act, 21  U.S.C. section 360bbb-3(b)(1), unless the authorization is  terminated or revoked. Performed at [facility], [address] [place], [place] [address]      Labs: BNP (last 3 results) No results for input(s): BNP in the last 8760 hours. Basic Metabolic Panel: Recent Labs  Lab 08/13/20 1843 08/13/20 2343 08/14/20 0110 08/14/20 0434  NA 128* 129*  --  131*  K 6.9* 6.8* 5.2* 4.8  CL 99 100  --  102  CO2 20* 20*  --  19*  GLUCOSE 112* 113*  --  137*  BUN 28* 30*  --  26*  CREATININE 1.23* 1.28*  --  1.23*  CALCIUM 9.6 9.7  --  8.6*  MG  --  2.0  --   --    Liver Function Tests: Recent Labs  Lab 08/13/20 2343  AST 25  ALT 13  ALKPHOS 86  BILITOT 0.6  PROT 6.8  ALBUMIN 3.9   Recent Labs  Lab 08/13/20 2343  LIPASE 29   No results for input(s): AMMONIA in the last 168 hours. CBC: Recent Labs  Lab 08/13/20 1843 08/14/20 0110  WBC 8.4 8.3  HGB 9.6* 8.6*  HCT 30.1* 26.6*  MCV 94.4 93.7  PLT 259 208   Cardiac Enzymes: No results for input(s): CKTOTAL, CKMB, CKMBINDEX, TROPONINI in the last 168 hours. BNP: Invalid input(s): POCBNP CBG: No results for input(s): GLUCAP in the last  168 hours. D-Dimer No results for input(s): DDIMER in the last 72 hours. Hgb A1c No results for input(s): HGBA1C in the last 72 hours. Lipid Profile No results for input(s): CHOL, HDL, LDLCALC, TRIG, CHOLHDL, LDLDIRECT in the last 72 hours. Thyroid function studies Recent Labs    08/13/20 1641  TSH 3.40   Anemia work up Recent Labs    08/13/20 1641  TIBC 269  IRON 41*   Urinalysis    Component Value Date/Time   COLORURINE YELLOW 03/06/2020 1510   APPEARANCEUR CLEAR 03/06/2020 1510   LABSPEC 1.012 03/06/2020 1510   PHURINE < OR = 5.0 03/06/2020 1510   GLUCOSEU NEGATIVE 03/06/2020 1510   HGBUR NEGATIVE 03/06/2020 1510   BILIRUBINUR NEGATIVE 01/17/2019 2042   KETONESUR NEGATIVE 03/06/2020 1510   PROTEINUR NEGATIVE 03/06/2020 1510   UROBILINOGEN 0.2 06/29/2014 1436   NITRITE NEGATIVE 03/06/2020 1510   LEUKOCYTESUR NEGATIVE 03/06/2020 1510   Sepsis Labs Invalid input(s): PROCALCITONIN,  WBC,  LACTICIDVEN Microbiology Recent Results (from the past 240 hour(s))  Respiratory Panel by RT PCR (Flu A&B, Covid) - Nasopharyngeal Swab     Status: None   Collection Time: 08/13/20 11:50 PM   Specimen: Nasopharyngeal Swab  Result Value Ref Range Status   SARS Coronavirus 2 by RT PCR NEGATIVE NEGATIVE Final    Comment: (NOTE) SARS-CoV-2 target nucleic acids are NOT DETECTED.  The SARS-CoV-2 RNA is generally detectable in upper respiratoy specimens during the acute phase of infection. The lowest concentration of SARS-CoV-2 viral copies this assay can detect is 131 copies/mL. A negative result does not preclude SARS-Cov-2 infection and should not be used as the sole basis for treatment or other patient management decisions. A negative result may occur with  improper specimen collection/handling, submission of specimen other than nasopharyngeal swab, presence of viral mutation(s) within the areas targeted by this assay, and inadequate number of viral copies (<131 copies/mL). A  negative result must be combined with clinical observations, patient history, and epidemiological information. The expected result is Negative.  Fact Sheet for Patients:  https://www.moore.com/  Fact Sheet for Healthcare Providers:  https://www.young.biz/  This test is no t yet approved or cleared by the [place] FDA and  has been authorized for detection and/or diagnosis of SARS-CoV-2 by FDA under an Emergency Use Authorization (EUA). This EUA will remain  in effect (meaning this test can be used) for the duration of the COVID-19 declaration under Section 564(b)(1) of the Act,  21 U.S.C. section 360bbb-3(b)(1), unless the authorization is terminated or revoked sooner.     Influenza A by PCR NEGATIVE NEGATIVE Final   Influenza B by PCR NEGATIVE NEGATIVE Final    Comment: (NOTE) The Xpert Xpress SARS-CoV-2/FLU/RSV assay is intended as an aid in  the diagnosis of influenza from Nasopharyngeal swab specimens and  should not be used as a sole basis for treatment. Nasal washings and  aspirates are unacceptable for Xpert Xpress SARS-CoV-2/FLU/RSV  testing.  Fact Sheet for Patients: https://www.moore.com/  Fact Sheet for Healthcare Providers: https://www.young.biz/  This test is not yet approved or cleared by the [place] FDA and  has been authorized for detection and/or diagnosis of SARS-CoV-2 by  FDA under an Emergency Use Authorization (EUA). This EUA will remain  in effect (meaning this test can be used) for the duration of the  Covid-19 declaration under Section 564(b)(1) of the Act, 21  U.S.C. section 360bbb-3(b)(1), unless the authorization is  terminated or revoked. Performed at [facility], [address] [place], [place] [address]      Time coordinating discharge: Over 30 minutes  SIGNED:   Azucena Fallen, DO [facility] 08/14/2020, 11:17 AM Pager   If  7PM-7AM, please contact night-coverage [email or web address]

## 2020-08-14 NOTE — Progress Notes (Signed)
Occupational Therapy Evaluation  PTA, pt lived independently alone with family checking on her,. She currently drives and enjoys reading medical books. Pt initially unsteady with gait, which improved once she used her shoes and was up moving. Pt is at risk for falls and recommend initial direct assistance with mobility and ADL - pt's daughter issued gait belt and returned demonstrated ability to safely walk with her mom. Recommend "fall alert" system - pt/daughter verbalized understanding. Recommend HHOT. Pt/daughter very Patent attorney.   08/14/20 1200  OT Visit Information  Last OT Received On 08/14/20  Assistance Needed +1  History of Present Illness Pt is a 86 y/o female admitted secondary to hyperkalemia and increased back pain. Found to have age indeterminate T spine fx. PMH includes CKD and pre-diabetes.   Precautions  Precautions Fall;Back  Precaution Booklet Issued No  Precaution Comments Pt was able to recall back precautions without  cues bur does not follow them during ADL  Restrictions  Weight Bearing Restrictions No  Home Living  Family/patient expects to be discharged to: Private residence  Living Arrangements Alone  Available Help at Discharge Family;Available PRN/intermittently  Type of [place] One level  Bathroom Shower/Tub Tub/shower unit  Tax adviser - 2 wheels;Walker - 4 wheels;BSC;Tub bench;Kasandra Knudsen - single point  Additional Comments DAughter works during the day   Prior Function  Level of Independence Independent with assistive device(s)  Comments Uses cane vs RW. REports she sponge bathes. Fixes her own meals. Drives. Drives her golf cart to the mailbox  Communication  Communication No difficulties  Pain Assessment  Pain Assessment No/denies pain  Cognition  Arousal/Alertness Awake/alert  Behavior During Therapy WFL for tasks assessed/performed  Overall Cognitive Status Within  Functional Limits for tasks assessed  General Comments Pt very anxious about her upcoming MRI and was tearful during.   Upper Extremity Assessment  Upper Extremity Assessment Generalized weakness (B RTC impairment but functional)  Lower Extremity Assessment  Lower Extremity Assessment Defer to PT evaluation  Cervical / Trunk Assessment  Cervical / Trunk Assessment Kyphotic  ADL  Overall ADL's  Needs assistance/impaired  Toilet Transfer Min guard;Ambulation;Grab bars  Toileting- Clothing Manipulation and Hygiene Min guard  Functional mobility during ADLs Min guard;Cane  General ADL Comments Set up for UB; overall min guard for LB  Bed Mobility  Overal bed mobility Needs Assistance  Bed Mobility Rolling;Sidelying to Sit;Sit to Sidelying  Rolling Min guard  Sidelying to sit Min guard  Sit to sidelying Supervision  General bed mobility comments Min guard to ensure proper technique. Increased time required.   Transfers  Overall transfer level Needs assistance  Equipment used None  Transfers Sit to/from Stand  Sit to Stand Min guard  Balance  Overall balance assessment Needs assistance  Sitting-balance support No upper extremity supported  Sitting balance-Leahy Scale Fair  Standing balance support Bilateral upper extremity supported;During functional activity  Standing balance-Leahy Scale Poor  Standing balance comment Reliant on BUE support   General Comments  General comments (skin integrity, edema, etc.) Educated daughter on strategies toreduce risk of falls; educatedon use of gait belt  OT - End of Session  Equipment Utilized During Treatment Gait belt  Activity Tolerance Patient tolerated treatment well  Patient left in bed;with call bell/phone within reach;with family/visitor present  Nurse Communication Mobility status;Other (comment) (DC plan)  OT Assessment  OT Recommendation/Assessment All further OT needs can be met in the next venue of  care  OT Visit Diagnosis  Unsteadiness on feet (R26.81)  OT Problem List Impaired balance (sitting and/or standing);Decreased safety awareness  OT Plan  OT Treatment/Interventions (ACUTE ONLY) Self-care/ADL training;Therapeutic activities  AM-PAC OT "6 Clicks" Daily Activity Outcome Measure (Version 2)  Help from another person eating meals? 4  Help from another person taking care of personal grooming? 3  Help from another person toileting, which includes using toliet, bedpan, or urinal? 3  Help from another person bathing (including washing, rinsing, drying)? 3  Help from another person to put on and taking off regular upper body clothing? 3  Help from another person to put on and taking off regular lower body clothing? 3  6 Click Score 19  OT Recommendation  Follow Up Recommendations Home health OT;Supervision/Assistance - 24 hour (initially)  OT Equipment None recommended by OT  Individuals Consulted  Consulted and Agree with Results and Recommendations Patient;Family member/caregiver  Family Member Consulted daughter Mariann Laster  Acute Rehab OT Goals  Patient Stated Goal to go home  OT Goal Formulation All assessment and education complete, DC therapy (Continue wtih HHOT)  OT Time Calculation  OT Start Time (ACUTE ONLY) 1158  OT Stop Time (ACUTE ONLY) 1233  OT Time Calculation (min) 35 min  OT General Charges  $OT Visit 1 Visit  OT Evaluation  $OT Eval Moderate Complexity 1 Mod  OT Treatments  $Self Care/Home Management  8-22 mins  Written Expression  Dominant Hand Right  Maurie Boettcher, OT/L   Acute OT Clinical Specialist [place] Pager [phone] Office [phone]

## 2020-08-14 NOTE — ED Notes (Signed)
Date and time results received: 08/14/20 0118 (use smartphrase ".now" to insert current time)  Test: Potassium Critical Value: 6.8  Name of Provider Notified: MD Cox  Orders Received? Or Actions Taken?: Pt has been medicated for high K+

## 2020-08-14 NOTE — Evaluation (Signed)
Physical Therapy Evaluation Patient Details Name: Shelby Bradley MRN: [id number] DOB: [birth year] Today's Date: 08/14/2020   History of Present Illness  Pt is a 86 y/o female admitted secondary to hyperkalemia and increased back pain. Found to have age indeterminate T spine fx. PMH includes CKD and pre-diabetes.   Clinical Impression  Pt admitted secondary to problem above with deficits below. Pt requiring min guard A for bed mobility and min A to stand and take side steps at edge of stretcher. Pt very anxious about upcoming MRI throughout. Anticipate pt will progress well and be able to d/c home with HHPT services. Will continue to follow acutely to maximize functional mobility independence and safety.     Follow Up Recommendations Home health PT;Supervision for mobility/OOB    Equipment Recommendations  None recommended by PT    Recommendations for Other Services       Precautions / Restrictions Precautions Precautions: Fall;Back Precaution Booklet Issued: No Precaution Comments: Pt was able to recall back precautions without  cues.  Restrictions Weight Bearing Restrictions: No      Mobility  Bed Mobility Overal bed mobility: Needs Assistance Bed Mobility: Rolling;Sidelying to Sit;Sit to Sidelying Rolling: Min guard Sidelying to sit: Min guard     Sit to sidelying: Supervision General bed mobility comments: Min guard to ensure proper technique. Increased time required.   Transfers Overall transfer level: Needs assistance Equipment used: None Transfers: Sit to/from Stand Sit to Stand: Min assist         General transfer comment: Min A for steadying to stand. Mil posterior lean noted. PT stood in front of pt and had pt hold to PT arms.   Ambulation/Gait Ambulation/Gait assistance: Min assist   Assistive device: None   Gait velocity: Decreased   General Gait Details: Performed side steps at EOB with min A. Practiced side stepping multiple times. Pt with mild  posterior lean. PT stood in front of pt and had pt hold to PT arms.   Stairs            Wheelchair Mobility    Modified Rankin (Stroke Patients Only)       Balance Overall balance assessment: Needs assistance Sitting-balance support: No upper extremity supported Sitting balance-Leahy Scale: Fair     Standing balance support: Bilateral upper extremity supported;During functional activity Standing balance-Leahy Scale: Poor Standing balance comment: Reliant on BUE support                              Pertinent Vitals/Pain Pain Assessment: No/denies pain    Home Living Family/patient expects to be discharged to:: Private residence Living Arrangements: Alone Available Help at Discharge: Family;Available PRN/intermittently Type of Home: House Home Access: Ramped entrance     Home Layout: One level Home Equipment: Walker - 2 wheels;Walker - 4 wheels;Bedside commode;Tub bench;Cane - single point Additional Comments: DAughter works during the day     Prior Function Level of Independence: Independent with assistive device(s)         Comments: Uses cane vs RW. REports she sponge bathes. Fixes her own meals.      Hand Dominance        Extremity/Trunk Assessment   Upper Extremity Assessment Upper Extremity Assessment: Defer to OT evaluation    Lower Extremity Assessment Lower Extremity Assessment: Generalized weakness    Cervical / Trunk Assessment Cervical / Trunk Assessment: Kyphotic  Communication   Communication: No difficulties  Cognition Arousal/Alertness: Awake/alert Behavior  During Therapy: Anxious Overall Cognitive Status: Within Functional Limits for tasks assessed                                 General Comments: Pt very anxious about her upcoming MRI and was tearful during.       General Comments General comments (skin integrity, edema, etc.): Pt's daughter present.     Exercises     Assessment/Plan    PT  Assessment Patient needs continued PT services  PT Problem List Decreased strength;Decreased balance;Decreased mobility;Decreased knowledge of use of DME;Decreased knowledge of precautions;Decreased activity tolerance       PT Treatment Interventions DME instruction;Gait training;Functional mobility training;Therapeutic activities;Therapeutic exercise;Balance training;Patient/family education    PT Goals (Current goals can be found in the Care Plan section)  Acute Rehab PT Goals Patient Stated Goal: to go home PT Goal Formulation: With patient Time For Goal Achievement: 08/28/20 Potential to Achieve Goals: Good    Frequency Min 3X/week   Barriers to discharge        Co-evaluation               AM-PAC PT "6 Clicks" Mobility  Outcome Measure Help needed turning from your back to your side while in a flat bed without using bedrails?: A Little Help needed moving from lying on your back to sitting on the side of a flat bed without using bedrails?: A Little Help needed moving to and from a bed to a chair (including a wheelchair)?: A Little Help needed standing up from a chair using your arms (e.g., wheelchair or bedside chair)?: A Little Help needed to walk in hospital room?: A Little Help needed climbing 3-5 steps with a railing? : A Lot 6 Click Score: 17    End of Session Equipment Utilized During Treatment: Gait belt Activity Tolerance: Patient tolerated treatment well Patient left: in bed;with call bell/phone within reach;with family/visitor present (on stretcher in ED ) Nurse Communication: Mobility status PT Visit Diagnosis: Unsteadiness on feet (R26.81);Muscle weakness (generalized) (M62.81)    Time: 7619-5093 PT Time Calculation (min) (ACUTE ONLY): 20 min   Charges:   PT Evaluation $PT Eval Low Complexity: 1 Low          Cindee Salt, DPT  Acute Rehabilitation Services  Pager: [phone] Office: [phone]   Lehman Prom 08/14/2020,  11:13 AM

## 2020-08-14 NOTE — H&P (Signed)
History and Physical   Shelby Bradley DOB: 29-Jul-1928 DOA: 08/13/2020  PCP: Lucky Cowboy, MD  Patient coming from: home, she lives alone  I have personally briefly reviewed patient's old medical records in [facility] EMR.  Chief Concern: back pain  HPI: Shelby Bradley is a 86 y.o. female with medical history significant for hypothyroid, insomnia, anxiety, presents with chief concern of back pain.  Of note, she slipped off her bed approximately 1 month prior, no head injury.   She endorses acute onset of right back pain, no recent trauma beyond the bed incident about one month prior. She was able to ambulate and perform ADLs.  CXR in ED was negative for flail chest, positive for multiple thoracic compression fracture, L2 superior endplate fracture, age indeterminate, MRI examination would be better to assess.   ED Course: Discussed with ED provider. Admit for hyperkalemia   Review of Systems: As per HPI otherwise 10 point review of systems negative.  Past Medical History:  Diagnosis Date  . Acute cholecystitis 01/18/2019  . Allergy   . Anemia   . Anxiety   . DDD (degenerative disc disease), lumbar   . Depression   . GERD (gastroesophageal reflux disease)   . Hyperlipidemia   . Hypertension   . OA (osteoarthritis) of knee    right knee  . Pre-diabetes   . Thyroid disease   . Vitamin D deficiency    Past Surgical History:  Procedure Laterality Date  . CATARACT EXTRACTION     both  . CHOLECYSTECTOMY  01/18/2019  . CHOLECYSTECTOMY N/A 01/18/2019   Procedure: LAPAROSCOPIC CHOLECYSTECTOMY WITH INTRAOPERATIVE CHOLANGIOGRAM;  Surgeon: Berna Bue, MD;  Location: [facility] OR;  Service: General;  Laterality: N/A;  . KNEE ARTHROSCOPY Left    Social History:  reports that she has never smoked. She has never used smokeless tobacco. She reports that she does not drink alcohol and does not use drugs.  Allergies  Allergen Reactions  . Norvasc [Amlodipine  Besylate]   . Sulfa Antibiotics    Family History  Problem Relation Age of Onset  . Hypertension Mother   . COPD Father   . Heart attack Brother   . Heart attack Brother    Family history: Family history reviewed, hypertension in mother   Prior to Admission medications   Medication Sig Start Date End Date Taking? Authorizing Provider  ALPRAZolam (XANAX) 0.25 MG tablet TAKE 1/2 TO 1 TABLET AT BEDTIME ONLY IF NEEDED FOR SLEEP & PLEASE TRY TO LIMIT TO 5 DAYS /WEEK TO AVOID ADDICTION & DEMENTIA 07/29/20   Lucky Cowboy, MD  amitriptyline (ELAVIL) 50 MG tablet TAKE 1 TABLET BY MOUTH EVERYDAY AT BEDTIME 02/01/20   Quentin Mulling, PA-C  Cholecalciferol (VITAMIN D-3) 5000 UNITS TABS Take 5,000 Units by mouth daily.    [provider]  cyanocobalamin (,VITAMIN B-12,) 1000 MCG/ML injection INJECT 1 MILLILITER INTO THE SKIN EVERY 30 DAYS 12/01/19   Lucky Cowboy, MD  ferrous sulfate 325 (65 FE) MG tablet Take 325 mg by mouth daily with breakfast.    [provider]  gabapentin (NEURONTIN) 100 MG capsule Take 1 capsule 3 x / day for Pain 11/07/19   Lucky Cowboy, MD  levothyroxine (SYNTHROID) 100 MCG tablet Take 1 tablet daily as directed on an empty stomach with only water for 30 minutes & no Antacid meds, Calcium or Magnesium for 4 hours & avoid Biotin 06/13/20   Judd Gaudier, NP  omeprazole (PRILOSEC) 40 MG capsule TAKE 1  CAPSULE DAILY TO PREVENT INDIGESTION & HEARTBURNT 08/11/20   Judd Gaudier, NP  Probiotic Product (RESTORA) CAPS Take 1 capsule Daily 11/22/19   Lucky Cowboy, MD  vitamin C (ASCORBIC ACID) 500 MG tablet Take 500 mg by mouth daily.    [provider]   Physical Exam: Vitals:   08/13/20 2328 08/13/20 2351 08/14/20 0004 08/14/20 0015  BP: (!) 176/71   (!) 159/91  Pulse: 85   (!) 101  Resp: (!) 23   (!) 26  Temp: 97.6 F (36.4 C)     TempSrc: Oral     SpO2: 100%  100% 100%  Weight:  42.8 kg    Height:  5\' 3"  (1.6 m)     Constitutional:  frail, calm, mild/moderate discomfort, bilateral temporal wasting Eyes: PERRL, lids and conjunctivae normal ENMT: Mucous membranes are moist. Posterior pharynx clear of any exudate or lesions.Normal dentition.  Neck: normal, supple, no masses, no thyromegaly Respiratory: clear to auscultation bilaterally, no wheezing, no crackles. Normal respiratory effort. No accessory muscle use.  Cardiovascular: Regular rate and rhythm, no murmurs / rubs / gallops. No extremity edema. 2+ pedal pulses. No carotid bruits.  Abdomen: no tenderness, no masses palpated. No hepatosplenomegaly. Bowel sounds positive.  Musculoskeletal: no clubbing / cyanosis. No joint deformity upper and lower extremities. Good ROM, no contractures. Normal muscle tone.  Skin: no rashes, lesions, ulcers. No induration Neurologic: CN 2-12 grossly intact. Sensation intact, strength 5/5 in all 4.  Psychiatric: Normal judgment and insight. Alert and oriented x 3. Normal mood.   Labs on Admission: I have personally reviewed following labs and imaging studies  CBC: Recent Labs  Lab 08/13/20 1843  WBC 8.4  HGB 9.6*  HCT 30.1*  MCV 94.4  PLT 259   Basic Metabolic Panel: Recent Labs  Lab 08/13/20 1843 08/13/20 2343  NA 128*  --   K 6.9*  --   CL 99  --   CO2 20*  --   GLUCOSE 112*  --   BUN 28*  --   CREATININE 1.23*  --   CALCIUM 9.6  --   MG  --  2.0   GFR: Estimated Creatinine Clearance: 20.5 mL/min (A) (by C-G formula based on SCr of 1.23 mg/dL (H)). Liver Function Tests: Recent Labs  Lab 08/13/20 2343  AST 25  ALT 13  ALKPHOS 86  BILITOT 0.6  PROT 6.8  ALBUMIN 3.9   Recent Labs  Lab 08/13/20 2343  LIPASE 29   Thyroid Function Tests: Recent Labs    08/13/20 1641  TSH 3.40   Urine analysis:    Component Value Date/Time   COLORURINE YELLOW 03/06/2020 1510   APPEARANCEUR CLEAR 03/06/2020 1510   LABSPEC 1.012 03/06/2020 1510   PHURINE < OR = 5.0 03/06/2020 1510   GLUCOSEU NEGATIVE 03/06/2020 1510    HGBUR NEGATIVE 03/06/2020 1510   BILIRUBINUR NEGATIVE 01/17/2019 2042   KETONESUR NEGATIVE 03/06/2020 1510   PROTEINUR NEGATIVE 03/06/2020 1510   UROBILINOGEN 0.2 06/29/2014 1436   NITRITE NEGATIVE 03/06/2020 1510   LEUKOCYTESUR NEGATIVE 03/06/2020 1510   Radiological Exams on Admission: Personally reviewed and I agree with radiologist reading as below.  DG Chest 2 View  Result Date: 08/13/2020 CLINICAL DATA:  Chest pain EXAM: CHEST - 2 VIEW COMPARISON:  01/17/2019 FINDINGS: The lungs are mildly, symmetrically hyperinflated in keeping with changes of underlying COPD. No superimposed focal pulmonary infiltrate. Mild biapical spine parenchymal scarring is again noted. No pneumothorax or pleural effusion. Cardiac size within normal  limits. Pulmonary vascularity is normal. Since the prior examination, there has developed a a moderate to severe wedge compression fracture of T8 with approximately 60% loss of height. There are compression fractures also noted of T5 and T6 with minimal loss of height. There is resultant extension weighted thoracic kyphosis, new since prior examination. A superior endplate fracture of L2 is also incidentally noted with approximately 20% loss of height. These fractures are age indeterminate though there is no significant paravertebral soft tissue swelling noted to indicate an acute fracture. IMPRESSION: Interval development of multiple thoracic compression fractures as well as an L2 superior endplate fracture, age indeterminate. Correlation for point tenderness is recommended. If desired, MRI examination could better age these fractures, particularly if vertebroplasty is to be considered for therapeutic intervention. COPD Electronically Signed   By: Helyn Numbers MD   On: 08/13/2020 19:31   EKG: Independently reviewed, showing sinus rhythm with peak T wave - Repeat EKG showed improved T wave  Assessment/Plan  Principal Problem:   Hyperkalemia Active Problems:    Normochromic normocytic anemia   Hypothyroidism   Stage 3 chronic kidney disease (HCC)   Rib pain on right side   # Hyperkalemia suspect secondary to diet changes - Per daughter in the room, patient has completely switched out her salt to  'No Salt' alternative for the last month - 'No Salt' ingredient contains potassium chloride - Received albuterol nebulizer and insulin aspart 5 units, and sodium bicarbonate injection 50 mEq in the ED - Admit to inpatient medical with telemetry  - Recheck BMP now - EKG changes with peak T wave and improved with recheck BMP  # RUQ pain - suspect secondary to her falling off the bed about one month ago - CXR and abdominal x-ray is reassuring - One time dose of fentanyl 12.5 mcg and then fentanyl 25 mg once - Checking a state repeat BMP and if Cr improves, will order and MRI per radiology recommendation - Oxycodone Sr q12h prn for pain  # Anxiety - one time dose of ativan 0.5 mg IV - She takes alprazolam 0.125 (0.25 mg tablet) that she breaks up half - Alprazolam 0.25 mg qhs Prn for anxiety  # AKI - etiology unclear at this time, suspect poor PO intake - NS 100 cc/hr   # Hypothyroid - resumed home levothyroxine 100 mcg   DVT prophylaxis: enoxparin Code Status: DNR Diet: regular  Family Communication: discussed with daughter at bedside Disposition Plan: pending clinical course Consults called: none at this time Admission status: inpatient  Rosibel Giacobbe N Ashawna Hanback D.O. [facility]  If 7AM-7PM, please contact day-coverage provider [email or web address]  08/14/2020, 12:43 AM

## 2020-08-14 NOTE — ED Notes (Signed)
Lab will add on BMP to LT green top that was sent down earlier.

## 2020-08-14 NOTE — Care Management (Signed)
Darrick Greenlaw J. Lucretia Roers, RN, BSN, [place] [phone] RNCM spoke with pt at bedside regarding discharge planning for Home Health Services. Offered pt medicare.gov list of home health agencies to choose from.  Pt chose [facility] to render services. Stacy Gardner, SW of [facility] notified. Patient made aware that [facility] will be in contact in 24-48 hours.  No DME needs identified at this time.

## 2020-08-15 ENCOUNTER — Telehealth: Payer: Self-pay

## 2020-08-15 NOTE — Telephone Encounter (Signed)
Spoke with [name] informed her that her mother's thyroid meds needed no change at this time

## 2020-08-16 ENCOUNTER — Telehealth: Payer: Self-pay | Admitting: *Deleted

## 2020-08-16 DIAGNOSIS — M6281 Muscle weakness (generalized): Secondary | ICD-10-CM | POA: Diagnosis not present

## 2020-08-16 DIAGNOSIS — N183 Chronic kidney disease, stage 3 unspecified: Secondary | ICD-10-CM | POA: Diagnosis not present

## 2020-08-16 DIAGNOSIS — M5136 Other intervertebral disc degeneration, lumbar region: Secondary | ICD-10-CM | POA: Diagnosis not present

## 2020-08-16 DIAGNOSIS — I129 Hypertensive chronic kidney disease with stage 1 through stage 4 chronic kidney disease, or unspecified chronic kidney disease: Secondary | ICD-10-CM | POA: Diagnosis not present

## 2020-08-16 DIAGNOSIS — N179 Acute kidney failure, unspecified: Secondary | ICD-10-CM | POA: Diagnosis not present

## 2020-08-16 DIAGNOSIS — G4700 Insomnia, unspecified: Secondary | ICD-10-CM | POA: Diagnosis not present

## 2020-08-16 DIAGNOSIS — D519 Vitamin B12 deficiency anemia, unspecified: Secondary | ICD-10-CM | POA: Diagnosis not present

## 2020-08-16 DIAGNOSIS — J449 Chronic obstructive pulmonary disease, unspecified: Secondary | ICD-10-CM | POA: Diagnosis not present

## 2020-08-16 DIAGNOSIS — E039 Hypothyroidism, unspecified: Secondary | ICD-10-CM | POA: Diagnosis not present

## 2020-08-16 DIAGNOSIS — K219 Gastro-esophageal reflux disease without esophagitis: Secondary | ICD-10-CM | POA: Diagnosis not present

## 2020-08-16 DIAGNOSIS — E559 Vitamin D deficiency, unspecified: Secondary | ICD-10-CM | POA: Diagnosis not present

## 2020-08-16 DIAGNOSIS — D649 Anemia, unspecified: Secondary | ICD-10-CM | POA: Diagnosis not present

## 2020-08-16 DIAGNOSIS — R7303 Prediabetes: Secondary | ICD-10-CM | POA: Diagnosis not present

## 2020-08-16 DIAGNOSIS — M1711 Unilateral primary osteoarthritis, right knee: Secondary | ICD-10-CM | POA: Diagnosis not present

## 2020-08-16 DIAGNOSIS — F419 Anxiety disorder, unspecified: Secondary | ICD-10-CM | POA: Diagnosis not present

## 2020-08-16 DIAGNOSIS — F329 Major depressive disorder, single episode, unspecified: Secondary | ICD-10-CM | POA: Diagnosis not present

## 2020-08-16 DIAGNOSIS — E875 Hyperkalemia: Secondary | ICD-10-CM | POA: Diagnosis not present

## 2020-08-16 DIAGNOSIS — E441 Mild protein-calorie malnutrition: Secondary | ICD-10-CM | POA: Diagnosis not present

## 2020-08-16 DIAGNOSIS — E785 Hyperlipidemia, unspecified: Secondary | ICD-10-CM | POA: Diagnosis not present

## 2020-08-16 DIAGNOSIS — H3530 Unspecified macular degeneration: Secondary | ICD-10-CM | POA: Diagnosis not present

## 2020-08-16 DIAGNOSIS — W19XXXD Unspecified fall, subsequent encounter: Secondary | ICD-10-CM | POA: Diagnosis not present

## 2020-08-16 NOTE — Telephone Encounter (Signed)
Called patient on 08/16/2020 , 3:59 PM in an attempt to reach the patient for a hospital follow up. Spoke with the patient's daughter.  Admit date: 08/13/20 Discharge: 08/14/20   She does not have any questions or concerns about medications from the hospital admission. The patient's medications were reviewed over the phone, they were counseled to bring in all current medications to the hospital follow up visit. There were no medication changes, but the patient was advised to discontinue NO Salt.   I advised the patient to call if any questions or concerns arise about the hospital admission or medications    Home health was started in the hospital. Per daughter, the patient is going to have a physical therapy and OT evaluation by home health. All questions were answered and a follow up appointment was made. Patient has an office visit 08/23/2020 with Dr Oneta Rack.  Prior to Admission medications   Medication Sig Start Date End Date Taking? Authorizing Provider  ALPRAZolam (XANAX) 0.25 MG tablet TAKE 1/2 TO 1 TABLET AT BEDTIME ONLY IF NEEDED FOR SLEEP & PLEASE TRY TO LIMIT TO 5 DAYS /WEEK TO AVOID ADDICTION & DEMENTIA Patient taking differently: Take 0.125-0.25 mg by mouth at bedtime as needed for sleep. Take 1/2 to 1 tablet    at Bedtime   ONLY       if needed for Sleep & please try to limit to 5 days /week to avoid Addiction & Dementia 07/29/20   Lucky Cowboy, MD  amitriptyline (ELAVIL) 50 MG tablet Take 25 mg by mouth at bedtime.    [provider]  Cholecalciferol (VITAMIN D-3) 5000 UNITS TABS Take 5,000 Units by mouth daily.    [provider]  cyanocobalamin (,VITAMIN B-12,) 1000 MCG/ML injection INJECT 1 MILLILITER INTO THE SKIN EVERY 30 DAYS Patient taking differently: Inject 1,000 mcg into the muscle every 30 (thirty) days. INJECT 1 MILLILITER INTO THE SKIN EVERY 30 DAYS 12/01/19   Lucky Cowboy, MD  ferrous sulfate 325 (65 FE) MG tablet Take 325 mg by mouth 2 (two)  times daily with a meal.     [provider]  levothyroxine (SYNTHROID) 100 MCG tablet Take 1 tablet daily as directed on an empty stomach with only water for 30 minutes & no Antacid meds, Calcium or Magnesium for 4 hours & avoid Biotin Patient taking differently: Take 100-150 mcg by mouth daily before breakfast. Take 1 tablet daily as directed on an empty stomach with only water for 30 minutes & no Antacid meds, Calcium or Magnesium for 4 hours & avoid Biotin daily except on Sunday & Wednesday 06/13/20   Judd Gaudier, NP  omeprazole (PRILOSEC) 40 MG capsule TAKE 1 CAPSULE DAILY TO PREVENT INDIGESTION & HEARTBURNT Patient taking differently: Take 40 mg by mouth daily. Take 1 capsule Daily to Prevent Indigestion & HeartburnT 08/11/20   Judd Gaudier, NP  Probiotic Product (RESTORA) CAPS Take 1 capsule Daily 11/22/19   Lucky Cowboy, MD  vitamin C (ASCORBIC ACID) 500 MG tablet Take 500 mg by mouth daily.    [provider]

## 2020-08-16 NOTE — Telephone Encounter (Signed)
I left a message for the patient to return my call.

## 2020-08-17 ENCOUNTER — Telehealth: Payer: Self-pay | Admitting: *Deleted

## 2020-08-17 ENCOUNTER — Other Ambulatory Visit: Payer: Self-pay | Admitting: Adult Health

## 2020-08-17 DIAGNOSIS — J449 Chronic obstructive pulmonary disease, unspecified: Secondary | ICD-10-CM | POA: Diagnosis not present

## 2020-08-17 DIAGNOSIS — E039 Hypothyroidism, unspecified: Secondary | ICD-10-CM

## 2020-08-17 DIAGNOSIS — N179 Acute kidney failure, unspecified: Secondary | ICD-10-CM | POA: Diagnosis not present

## 2020-08-17 DIAGNOSIS — M6281 Muscle weakness (generalized): Secondary | ICD-10-CM | POA: Diagnosis not present

## 2020-08-17 DIAGNOSIS — E875 Hyperkalemia: Secondary | ICD-10-CM | POA: Diagnosis not present

## 2020-08-17 DIAGNOSIS — M5136 Other intervertebral disc degeneration, lumbar region: Secondary | ICD-10-CM | POA: Diagnosis not present

## 2020-08-17 DIAGNOSIS — W19XXXD Unspecified fall, subsequent encounter: Secondary | ICD-10-CM | POA: Diagnosis not present

## 2020-08-17 NOTE — Telephone Encounter (Signed)
OK for physical therapy q time a WEEK FOR 1 WEEK AND 2 TIMES A WEEK FOR 4 WEEKS.

## 2020-08-20 ENCOUNTER — Telehealth: Payer: Self-pay | Admitting: *Deleted

## 2020-08-20 DIAGNOSIS — J449 Chronic obstructive pulmonary disease, unspecified: Secondary | ICD-10-CM | POA: Diagnosis not present

## 2020-08-20 DIAGNOSIS — W19XXXD Unspecified fall, subsequent encounter: Secondary | ICD-10-CM | POA: Diagnosis not present

## 2020-08-20 DIAGNOSIS — N179 Acute kidney failure, unspecified: Secondary | ICD-10-CM | POA: Diagnosis not present

## 2020-08-20 DIAGNOSIS — M5136 Other intervertebral disc degeneration, lumbar region: Secondary | ICD-10-CM | POA: Diagnosis not present

## 2020-08-20 DIAGNOSIS — E875 Hyperkalemia: Secondary | ICD-10-CM | POA: Diagnosis not present

## 2020-08-20 DIAGNOSIS — M6281 Muscle weakness (generalized): Secondary | ICD-10-CM | POA: Diagnosis not present

## 2020-08-20 NOTE — Telephone Encounter (Signed)
Per Dr Oneta Rack, OK for OT 2 times a week for 2 weeks for strengthening, balance and endurance to be able to maintain independence with self care. A message was left to inform the therapist.

## 2020-08-21 ENCOUNTER — Ambulatory Visit: Payer: Medicare Other

## 2020-08-21 DIAGNOSIS — M5136 Other intervertebral disc degeneration, lumbar region: Secondary | ICD-10-CM | POA: Diagnosis not present

## 2020-08-21 DIAGNOSIS — J449 Chronic obstructive pulmonary disease, unspecified: Secondary | ICD-10-CM | POA: Diagnosis not present

## 2020-08-21 DIAGNOSIS — W19XXXD Unspecified fall, subsequent encounter: Secondary | ICD-10-CM | POA: Diagnosis not present

## 2020-08-21 DIAGNOSIS — E875 Hyperkalemia: Secondary | ICD-10-CM | POA: Diagnosis not present

## 2020-08-21 DIAGNOSIS — M6281 Muscle weakness (generalized): Secondary | ICD-10-CM | POA: Diagnosis not present

## 2020-08-21 DIAGNOSIS — N179 Acute kidney failure, unspecified: Secondary | ICD-10-CM | POA: Diagnosis not present

## 2020-08-23 ENCOUNTER — Other Ambulatory Visit: Payer: Self-pay

## 2020-08-23 ENCOUNTER — Encounter: Payer: Self-pay | Admitting: Internal Medicine

## 2020-08-23 ENCOUNTER — Other Ambulatory Visit: Payer: Self-pay | Admitting: *Deleted

## 2020-08-23 ENCOUNTER — Ambulatory Visit (INDEPENDENT_AMBULATORY_CARE_PROVIDER_SITE_OTHER): Payer: Medicare Other | Admitting: Internal Medicine

## 2020-08-23 VITALS — BP 126/82 | HR 79 | Temp 97.0°F | Resp 16 | Ht 63.5 in | Wt 98.2 lb

## 2020-08-23 DIAGNOSIS — Z79899 Other long term (current) drug therapy: Secondary | ICD-10-CM

## 2020-08-23 DIAGNOSIS — E039 Hypothyroidism, unspecified: Secondary | ICD-10-CM

## 2020-08-23 DIAGNOSIS — D649 Anemia, unspecified: Secondary | ICD-10-CM | POA: Diagnosis not present

## 2020-08-23 DIAGNOSIS — E871 Hypo-osmolality and hyponatremia: Secondary | ICD-10-CM

## 2020-08-23 DIAGNOSIS — E875 Hyperkalemia: Secondary | ICD-10-CM

## 2020-08-23 DIAGNOSIS — E860 Dehydration: Secondary | ICD-10-CM | POA: Diagnosis not present

## 2020-08-23 DIAGNOSIS — S32020S Wedge compression fracture of second lumbar vertebra, sequela: Secondary | ICD-10-CM

## 2020-08-23 MED ORDER — LEVOTHYROXINE SODIUM 50 MCG PO TABS: 100.0000 ug | ORAL_TABLET | Freq: Every day | ORAL | 1 refills | Status: DC

## 2020-08-23 NOTE — Patient Instructions (Signed)

## 2020-08-23 NOTE — Progress Notes (Signed)
[facility]     This very nice 86 y.o. [name] was admitted to the hospital on  08/13/2020  and patient was discharged from the hospital  9 days ago on 08/14/2020. The patient now presents for follow up for transition from recent hospitalization .  The day after discharge  our clinical staff contacted the patient to assure stability and schedule a follow up appointment. The discharge summary, medications and diagnostic test results were reviewed before meeting with the patient. The patient was admitted for:   Hyperkalemia Hyponatremia Dehydration Anemia Hypothyroidism Closed compression fracture of L2 vertebra, sequela     Patient ha fallen several weeks before ER presentation with c/o increasing Low back pain & was discovered to have a L2 compression Fx. She also was discovered to have Anemia (Hgb 9.6 gm%),   Hyponatremia (128) & Hyperkalemia (6.9) . Patient was treated with IV fluids for rehydration and correction of electrolyte abnormalities.      Hospitalization discharge instructions and medications are reconciled with the patient.      Patient is also followed with Hypertension, Hyperlipidemia, Pre-Diabetes and Vitamin D Deficiency.      Patient is treated for HTN & BP has been controlled at home. Today's BP: 126/82. Patient has had no complaints of any cardiac type chest pain, palpitations, dyspnea/orthopnea/PND, dizziness, claudication, or dependent edema.     Hyperlipidemia is controlled with diet & meds. Patient denies myalgias or other med SE's. Last Lipids were at goal: Lab Results  Component Value Date   CHOL 170 04/11/2020   HDL 64 04/11/2020   LDLCALC 86 04/11/2020   TRIG 102 04/11/2020   CHOLHDL 2.7 04/11/2020       Also, the patient has history of PreDiabetes and has had no symptoms of reactive hypoglycemia, diabetic polys, paresthesias or visual blurring.  Last A1c was  Lab Results  Component Value Date   HGBA1C 5.3 01/09/2020       Further, the  patient also has history of Vitamin D Deficiency and supplements vitamin D without any suspected side-effects. Last vitamin D was   Lab Results  Component Value Date   VD25OH 31 01/09/2020    Current Outpatient Medications on File Prior to Visit  Medication Sig  . ALPRAZolam (XANAX) 0.25 MG tablet 1/2 -1 Tab at BEDTIME ONLY IF NEEDED   . amitriptyline (ELAVIL) 50 MG tablet Take 25 mg at bedtime.  Marland Kitchen VITAMIN D 5000 UNITS TABS Take 5,000 Units daily.  Marland Kitchen VITAMIN B-12 1000 MCG/ML inj INJECT 1 ml EVERY 30 DAYS   . ferrous sulfate 325 (65 FE) MG tablet Take  2 times daily with a meal.   . levothyroxine  100 MCG tablet Takes daily except on Sun & Wed  . Omeprazole 40 MG capsule TAKE 1 CAPSULE DAILY   . RESTORA CAPS Take 1 capsule Daily  . vitamin C  500 MG tablet Take daily.    Allergies  Allergen Reactions  . Norvasc [Amlodipine Besylate]   . Sulfa Antibiotics    PMHx:   Past Medical History:  Diagnosis Date  . Acute cholecystitis 01/18/2019  . Allergy   . Anemia   . Anxiety   . DDD (degenerative disc disease), lumbar   . Depression   . GERD (gastroesophageal reflux disease)   . Hyperlipidemia   . Hypertension   . OA (osteoarthritis) of knee    right knee  . Pre-diabetes   . Thyroid disease   . Vitamin D deficiency  Immunization History  Administered Date(s) Administered  . DTaP 05/20/2012  . Influenza Whole 07/21/2013  . Influenza, High Dose Seasonal PF 06/29/2014, 07/30/2015, 07/10/2016, 09/07/2017, 08/10/2018, 08/24/2019  . PFIZER SARS-COV-2 Vaccination 01/01/2020, 01/31/2020  . Pneumococcal Conjugate-13 05/29/2014  . Pneumococcal Polysaccharide-23 05/07/2010, 12/05/2014  . Tdap 05/20/2012  . Zoster 11/03/2006   Past Surgical History:  Procedure Laterality Date  . CATARACT EXTRACTION     both  . CHOLECYSTECTOMY  01/18/2019  . CHOLECYSTECTOMY N/A 01/18/2019   Procedure: LAPAROSCOPIC CHOLECYSTECTOMY WITH INTRAOPERATIVE CHOLANGIOGRAM;  Surgeon:  Berna Bue, MD;  Location: [facility] OR;  Service: General;  Laterality: N/A;  . KNEE ARTHROSCOPY Left    FHx:    Reviewed / unchanged  SHx:    Reviewed / unchanged  Systems Review:  Constitutional: Denies fever, chills, wt changes, headaches, insomnia, fatigue, night sweats, change in appetite. Eyes: Denies redness, blurred vision, diplopia, discharge, itchy, watery eyes.  ENT: Denies discharge, congestion, post nasal drip, epistaxis, sore throat, earache, hearing loss, dental pain, tinnitus, vertigo, sinus pain, snoring.  CV: Denies chest pain, palpitations, irregular heartbeat, syncope, dyspnea, diaphoresis, orthopnea, PND, claudication or edema. Respiratory: denies cough, dyspnea, DOE, pleurisy, hoarseness, laryngitis, wheezing.  Gastrointestinal: Denies dysphagia, odynophagia, heartburn, reflux, water brash, abdominal pain or cramps, nausea, vomiting, bloating, diarrhea, constipation, hematemesis, melena, hematochezia  or hemorrhoids. Genitourinary: Denies dysuria, frequency, urgency, nocturia, hesitancy, discharge, hematuria or flank pain. Musculoskeletal: Denies arthralgias, myalgias, stiffness, jt. swelling, pain, limping or strain/sprain.  Skin: Denies pruritus, rash, hives, warts, acne, eczema or change in skin lesion(s). Neuro: No weakness, tremor, incoordination, spasms, paresthesia or pain. Psychiatric: Denies confusion, memory loss or sensory loss. Endo: Denies change in weight, skin or hair change.  Heme/Lymph: No excessive bleeding, bruising or enlarged lymph nodes.  Physical Exam  BP 126/82   Pulse 79   Temp (!) 97 F (36.1 C)   Resp 16   Ht 5' 3.5" (1.613 m)   Wt 98 lb 3.2 oz (44.5 kg)   SpO2 99%   BMI 17.12 kg/m   Appears well nourished, well groomed  and in no distress.  Eyes: PERRLA, EOMs, conjunctiva no swelling or erythema. Sinuses: No frontal/maxillary tenderness ENT/Mouth: EAC's clear, TM's nl w/o erythema, bulging. Nares clear w/o erythema, swelling,  exudates. Oropharynx clear without erythema or exudates. Oral hygiene is good. Tongue normal, non obstructing. Hearing intact.  Neck: Supple. Thyroid nl. Car 2+/2+ without bruits, nodes or JVD. Chest: Respirations nl with BS clear & equal w/o rales, rhonchi, wheezing or stridor.  Cor: Heart sounds normal w/ regular rate and rhythm without sig. murmurs, gallops, clicks or rubs. Peripheral pulses normal and equal  without edema.  Abdomen: Soft & bowel sounds normal. Non-tender w/o guarding, rebound, hernias, masses or organomegaly.  Lymphatics: Unremarkable.  Musculoskeletal: Full ROM all peripheral extremities, joint stability, 5/5 strength and normal gait.  Skin: Warm, dry without exposed rashes, lesions or ecchymosis apparent.  Neuro: Cranial nerves intact, reflexes equal bilaterally. Sensory-motor testing grossly intact. Tendon reflexes grossly intact.  Pysch: Alert & oriented x 3.  Insight and judgement nl & appropriate. No ideations.  Assessment and Plan:  1. Hyperkalemia  - COMPLETE METABOLIC PANEL WITH GFR - Magnesium  2. Hyponatremia   3. Dehydration  - COMPLETE METABOLIC PANEL WITH GFR - Magnesium  4. Anemia, unspecified type - CBC with Differential/Platelet - Reticulocytes  5. Hypothyroidism  - Monitor appropriate labs.  6. Closed compression fracture of L2 vertebra, sequela  7. Medication management  - CBC with Differential/Platelet -  COMPLETE METABOLIC PANEL WITH GFR - Magnesium - Reticulocytes       Discussed  regular exercise, BP monitoring, weight control to achieve/maintain BMI less than 25 and discussed meds and SE's. Recommended labs to assess and monitor clinical status with further disposition pending results of labs. Over 30 minutes of exam, counseling, chart review was performed.  Lucky Cowboy, M.D.

## 2020-08-24 DIAGNOSIS — J449 Chronic obstructive pulmonary disease, unspecified: Secondary | ICD-10-CM | POA: Diagnosis not present

## 2020-08-24 DIAGNOSIS — W19XXXD Unspecified fall, subsequent encounter: Secondary | ICD-10-CM | POA: Diagnosis not present

## 2020-08-24 DIAGNOSIS — M6281 Muscle weakness (generalized): Secondary | ICD-10-CM | POA: Diagnosis not present

## 2020-08-24 DIAGNOSIS — M5136 Other intervertebral disc degeneration, lumbar region: Secondary | ICD-10-CM | POA: Diagnosis not present

## 2020-08-24 DIAGNOSIS — E875 Hyperkalemia: Secondary | ICD-10-CM | POA: Diagnosis not present

## 2020-08-24 DIAGNOSIS — N179 Acute kidney failure, unspecified: Secondary | ICD-10-CM | POA: Diagnosis not present

## 2020-08-24 LAB — COMPLETE METABOLIC PANEL WITH GFR
AG Ratio: 1.6 (calc) (ref 1.0–2.5)
ALT: 11 U/L (ref 6–29)
AST: 19 U/L (ref 10–35)
Albumin: 3.6 g/dL (ref 3.6–5.1)
Alkaline phosphatase (APISO): 86 U/L (ref 37–153)
BUN/Creatinine Ratio: 24 (calc) — ABNORMAL HIGH (ref 6–22)
BUN: 25 mg/dL (ref 7–25)
CO2: 24 mmol/L (ref 20–32)
Calcium: 9.2 mg/dL (ref 8.6–10.4)
Chloride: 103 mmol/L (ref 98–110)
Creat: 1.03 mg/dL — ABNORMAL HIGH (ref 0.60–0.88)
GFR, Est African American: 55 mL/min/{1.73_m2} — ABNORMAL LOW (ref >=60)
GFR, Est Non African American: 48 mL/min/{1.73_m2} — ABNORMAL LOW (ref >=60)
Globulin: 2.2 g/dL (calc) (ref 1.9–3.7)
Glucose, Bld: 110 mg/dL — ABNORMAL HIGH (ref 65–99)
Potassium: 4.8 mmol/L (ref 3.5–5.3)
Sodium: 132 mmol/L — ABNORMAL LOW (ref 135–146)
Total Bilirubin: 0.3 mg/dL (ref 0.2–1.2)
Total Protein: 5.8 g/dL — ABNORMAL LOW (ref 6.1–8.1)

## 2020-08-24 LAB — CBC WITH DIFFERENTIAL/PLATELET
Absolute Monocytes: 495 cells/uL (ref 200–950)
Basophils Absolute: 30 cells/uL (ref 0–200)
Basophils Relative: 0.6 %
Eosinophils Absolute: 30 cells/uL (ref 15–500)
Eosinophils Relative: 0.6 %
HCT: 28.3 % — ABNORMAL LOW (ref 35.0–45.0)
Hemoglobin: 9.1 g/dL — ABNORMAL LOW (ref 11.7–15.5)
Lymphs Abs: 1370 cells/uL (ref 850–3900)
MCH: 30.1 pg (ref 27.0–33.0)
MCHC: 32.2 g/dL (ref 32.0–36.0)
MCV: 93.7 fL (ref 80.0–100.0)
MPV: 9 fL (ref 7.5–12.5)
Monocytes Relative: 9.9 %
Neutro Abs: 3075 cells/uL (ref 1500–7800)
Neutrophils Relative %: 61.5 %
Platelets: 234 10*3/uL (ref 140–400)
RBC: 3.02 10*6/uL — ABNORMAL LOW (ref 3.80–5.10)
RDW: 13.8 % (ref 11.0–15.0)
Total Lymphocyte: 27.4 %
WBC: 5 10*3/uL (ref 3.8–10.8)

## 2020-08-24 LAB — RETICULOCYTES
ABS Retic: 33220 cells/uL (ref 20000–8000)
Retic Ct Pct: 1.1 %

## 2020-08-24 LAB — MAGNESIUM: Magnesium: 1.8 mg/dL (ref 1.5–2.5)

## 2020-08-25 NOTE — Progress Notes (Signed)
========================================================== ==========================================================  -    Chronic mild Anemia - is stable  ==========================================================  -  Kidney Functions remain Stage 3a and Stable ==========================================================  -  Potassium is Normal  ==========================================================  -  All Else - CBC - Electrolytes - Liver - Magnesium & Thyroid    - all  Normal / OK ==========================================================

## 2020-08-26 ENCOUNTER — Encounter: Payer: Self-pay | Admitting: Internal Medicine

## 2020-08-27 DIAGNOSIS — E875 Hyperkalemia: Secondary | ICD-10-CM | POA: Diagnosis not present

## 2020-08-27 DIAGNOSIS — J449 Chronic obstructive pulmonary disease, unspecified: Secondary | ICD-10-CM | POA: Diagnosis not present

## 2020-08-27 DIAGNOSIS — W19XXXD Unspecified fall, subsequent encounter: Secondary | ICD-10-CM | POA: Diagnosis not present

## 2020-08-27 DIAGNOSIS — M5136 Other intervertebral disc degeneration, lumbar region: Secondary | ICD-10-CM | POA: Diagnosis not present

## 2020-08-27 DIAGNOSIS — M6281 Muscle weakness (generalized): Secondary | ICD-10-CM | POA: Diagnosis not present

## 2020-08-27 DIAGNOSIS — N179 Acute kidney failure, unspecified: Secondary | ICD-10-CM | POA: Diagnosis not present

## 2020-08-28 ENCOUNTER — Encounter: Payer: Self-pay | Admitting: *Deleted

## 2020-08-29 DIAGNOSIS — J449 Chronic obstructive pulmonary disease, unspecified: Secondary | ICD-10-CM | POA: Diagnosis not present

## 2020-08-29 DIAGNOSIS — E875 Hyperkalemia: Secondary | ICD-10-CM | POA: Diagnosis not present

## 2020-08-29 DIAGNOSIS — W19XXXD Unspecified fall, subsequent encounter: Secondary | ICD-10-CM | POA: Diagnosis not present

## 2020-08-29 DIAGNOSIS — N179 Acute kidney failure, unspecified: Secondary | ICD-10-CM | POA: Diagnosis not present

## 2020-08-29 DIAGNOSIS — M6281 Muscle weakness (generalized): Secondary | ICD-10-CM | POA: Diagnosis not present

## 2020-08-29 DIAGNOSIS — M5136 Other intervertebral disc degeneration, lumbar region: Secondary | ICD-10-CM | POA: Diagnosis not present

## 2020-08-31 DIAGNOSIS — M5136 Other intervertebral disc degeneration, lumbar region: Secondary | ICD-10-CM | POA: Diagnosis not present

## 2020-08-31 DIAGNOSIS — J449 Chronic obstructive pulmonary disease, unspecified: Secondary | ICD-10-CM | POA: Diagnosis not present

## 2020-08-31 DIAGNOSIS — N179 Acute kidney failure, unspecified: Secondary | ICD-10-CM | POA: Diagnosis not present

## 2020-08-31 DIAGNOSIS — W19XXXD Unspecified fall, subsequent encounter: Secondary | ICD-10-CM | POA: Diagnosis not present

## 2020-08-31 DIAGNOSIS — E875 Hyperkalemia: Secondary | ICD-10-CM | POA: Diagnosis not present

## 2020-08-31 DIAGNOSIS — M6281 Muscle weakness (generalized): Secondary | ICD-10-CM | POA: Diagnosis not present

## 2020-09-04 DIAGNOSIS — W19XXXD Unspecified fall, subsequent encounter: Secondary | ICD-10-CM | POA: Diagnosis not present

## 2020-09-04 DIAGNOSIS — N179 Acute kidney failure, unspecified: Secondary | ICD-10-CM | POA: Diagnosis not present

## 2020-09-04 DIAGNOSIS — E875 Hyperkalemia: Secondary | ICD-10-CM | POA: Diagnosis not present

## 2020-09-04 DIAGNOSIS — M6281 Muscle weakness (generalized): Secondary | ICD-10-CM | POA: Diagnosis not present

## 2020-09-04 DIAGNOSIS — M5136 Other intervertebral disc degeneration, lumbar region: Secondary | ICD-10-CM | POA: Diagnosis not present

## 2020-09-04 DIAGNOSIS — J449 Chronic obstructive pulmonary disease, unspecified: Secondary | ICD-10-CM | POA: Diagnosis not present

## 2020-09-07 DIAGNOSIS — W19XXXD Unspecified fall, subsequent encounter: Secondary | ICD-10-CM | POA: Diagnosis not present

## 2020-09-07 DIAGNOSIS — M5136 Other intervertebral disc degeneration, lumbar region: Secondary | ICD-10-CM | POA: Diagnosis not present

## 2020-09-07 DIAGNOSIS — E875 Hyperkalemia: Secondary | ICD-10-CM | POA: Diagnosis not present

## 2020-09-07 DIAGNOSIS — J449 Chronic obstructive pulmonary disease, unspecified: Secondary | ICD-10-CM | POA: Diagnosis not present

## 2020-09-07 DIAGNOSIS — M6281 Muscle weakness (generalized): Secondary | ICD-10-CM | POA: Diagnosis not present

## 2020-09-07 DIAGNOSIS — N179 Acute kidney failure, unspecified: Secondary | ICD-10-CM | POA: Diagnosis not present

## 2020-09-10 DIAGNOSIS — M5136 Other intervertebral disc degeneration, lumbar region: Secondary | ICD-10-CM | POA: Diagnosis not present

## 2020-09-10 DIAGNOSIS — N179 Acute kidney failure, unspecified: Secondary | ICD-10-CM | POA: Diagnosis not present

## 2020-09-10 DIAGNOSIS — J449 Chronic obstructive pulmonary disease, unspecified: Secondary | ICD-10-CM | POA: Diagnosis not present

## 2020-09-10 DIAGNOSIS — W19XXXD Unspecified fall, subsequent encounter: Secondary | ICD-10-CM | POA: Diagnosis not present

## 2020-09-10 DIAGNOSIS — M6281 Muscle weakness (generalized): Secondary | ICD-10-CM | POA: Diagnosis not present

## 2020-09-10 DIAGNOSIS — E875 Hyperkalemia: Secondary | ICD-10-CM | POA: Diagnosis not present

## 2020-09-12 DIAGNOSIS — M5136 Other intervertebral disc degeneration, lumbar region: Secondary | ICD-10-CM | POA: Diagnosis not present

## 2020-09-12 DIAGNOSIS — N179 Acute kidney failure, unspecified: Secondary | ICD-10-CM | POA: Diagnosis not present

## 2020-09-12 DIAGNOSIS — W19XXXD Unspecified fall, subsequent encounter: Secondary | ICD-10-CM | POA: Diagnosis not present

## 2020-09-12 DIAGNOSIS — E875 Hyperkalemia: Secondary | ICD-10-CM | POA: Diagnosis not present

## 2020-09-12 DIAGNOSIS — M6281 Muscle weakness (generalized): Secondary | ICD-10-CM | POA: Diagnosis not present

## 2020-09-12 DIAGNOSIS — J449 Chronic obstructive pulmonary disease, unspecified: Secondary | ICD-10-CM | POA: Diagnosis not present

## 2020-09-14 ENCOUNTER — Encounter: Payer: Self-pay | Admitting: Adult Health

## 2020-09-14 DIAGNOSIS — Z681 Body mass index (BMI) 19 or less, adult: Secondary | ICD-10-CM | POA: Insufficient documentation

## 2020-09-14 NOTE — Progress Notes (Signed)
MEDICARE ANNUAL WELLNESS AND 3 MONTH OV  Assessment:   Encounter for Medicare annual wellness exam 1 year  Essential hypertension - continue medications, DASH diet, exercise and monitor at home. Call if greater than 150/80.  -     CMP/GFR  Chronic obstructive pulmonary disease, unspecified COPD type ([place]) Avoid triggers, continue allergy pill  Mixed hyperlipidemia -decrease fatty foods, increase activity.   Hypothyroidism, unspecified type Hypothyroidism-check TSH level, continue medications the same, reminded to take on an empty stomach 30-79mns before food.   Malnutrition of mild degree (H[facility]) Continue protein/weight gain/ensure -     CMP/GFR  Other abnormal glucose Monitor  Medication management -     Magnesium  Anemia due to vitamin B12 deficiency, unspecified B12 deficiency type Continue injections  Anemia, unspecified type Monitor, essentially stalbe from baseline; continue B12/iron supplements Check folate   Vitamin D deficiency Continue supplement  Macular degeneration, unspecified laterality, unspecified type Continue follow up Dr. MZigmund Daniel Gastroesophageal reflux disease, esophagitis presence not specified Continue PPI/H2 blocker, diet discussed  BMI < 19 Continue ensure, good progress with gaining weight   Hyperkalemia Resolved, NO MORE NO SALT SUPPLEMENT Monitor for recurrence   Hyponatremia - CMP/GFR  Peripheral edema - elevate legs TID, increase activity, increase water, decrease sodium intake.  Wear compression socks more routinely if available. Return to the office if no change with symptoms. Check labs.   Over 40 minutes of exam, counseling, chart review and critical decision making was performed Future Appointments  Date Time Provider [place] 09/17/2021 10:30 AM CLiane Comber NP [facility] None     Plan:   During the course of the visit the patient was educated and counseled about appropriate screening and  preventive services including:    Pneumococcal vaccine   Prevnar 13  Influenza vaccine  Td vaccine  Screening electrocardiogram  Bone densitometry screening  Colorectal cancer screening  Diabetes screening  Glaucoma screening  Nutrition counseling   Advanced directives: requested   Subjective:  SJENNIFER PAYESis a 86y.o. female who presents for Medicare Annual Wellness Visit and 3MOV.   She was recently evaluated in ED following a fall several weeks prior c/o increasing Low back pain & was discovered to have a L2 compression Fx. She also was discovered to have Anemia (Hgb 9.6 gm%),   Hyponatremia (128) & Hyperkalemia (6.9) . Patient was treated with IV fluids for rehydration and correction of electrolyte abnormalities. Patient notes has had some swelling in her ankles when dependent since then. Resolves by AM. Denies dyspnea, fatigue, PND, coughing.   On review baseline hemoglobin has been in 9-10 range She had reticulocyctes checked by Dr. MMelford Aaseon 08/23/2020 that showed appropriate responese  CBC Latest Ref Rng & Units 08/23/2020 08/14/2020 08/13/2020  WBC 3.8 - 10.8 Thousand/uL 5.0 8.3 8.4  Hemoglobin 11.7 - 15.5 g/dL 9.1(L) 8.6(L) 9.6(L)  Hematocrit 35 - 45 % 28.3(L) 26.6(L) 30.1(L)  Platelets 140 - 400 Thousand/uL 234 208 259   She gets monthly B12 injections, due today.  Lab Results  Component Value Date   VITAMINB12 >2,000 (H) 03/06/2020   She has had intermittent iron def, on iron supplement, taking 143 mg BID due to GI upset.  Lab Results  Component Value Date   IRON 41 (L) 08/13/2020   TIBC 269 08/13/2020   FERRITIN 148 07/16/2020     She has chronic hearing loss in left ear from mastoid sx when she was younger, wears bilateral hearing aids. She lives by herself,  daughter/son and close by and check on her regularly. Ambulates with a cane, no falls, but planning on getting a life alert. Drives very short distances if needed, but daughter typically  accompanies her.   Follows up with Dr. Katy Fitch regularly, and he sent her to Dr. Rodman Key for mac degeneration, she gets injections. She does still drive but does not drive at night.    She takes amitriptyline 1/2 pill at night for sleep.  She will take xanax 0.79m occ during the day for anxiety, will take 1/2 at least once a week.   BMI is Body mass index is 17.92 kg/m., she is working on diet and exercise, she is working on weight gain/being stable. She is on ensure and doing well. She walks daily with cane to mail box, 5-10 min daily, also also works on balance exercises, 10 min daily. Walks  Wt Readings from Last 3 Encounters:  09/17/20 102 lb 12.8 oz (46.6 kg)  08/23/20 98 lb 3.2 oz (44.5 kg)  08/13/20 94 lb 4.8 oz (42.8 kg)   Her blood pressure has been controlled at home, today their BP is BP: 140/62  She does workout. She denies chest pain, shortness of breath, dizziness.   She is not on cholesterol medication and denies myalgias. Her cholesterol is at goal. The cholesterol last visit was:   Lab Results  Component Value Date   CHOL 170 04/11/2020   HDL 64 04/11/2020   LDLCALC 86 04/11/2020   TRIG 102 04/11/2020   CHOLHDL 2.7 04/11/2020   She does not have diabetes, has CKD due to HTN and age.  Last GFR: Lab Results  Component Value Date   GFRNONAA 48 (L) 08/23/2020   Patient is on Vitamin D supplement.   Lab Results  Component Value Date   VD25OH 86203/06/2020     She is on thyroid medication. Her medication was not changed last visit, 1 pill daily of the 1246m.  Lab Results  Component Value Date   TSH 3.40 08/13/2020  .     Medication Review:  Current Outpatient Medications (Endocrine & Metabolic):    levothyroxine (SYNTHROID) 50 MCG tablet, Take 2-3 tablets (100-150 mcg total) by mouth daily before breakfast. Take 1 tablet daily as directed on an empty stomach with only water for 30 minutes & no Antacid meds, Calcium or Magnesium for 4 hours & avoid Biotin  10027mdaily except 150m65mn Sunday & Wednesday     Current Outpatient Medications (Hematological):    cyanocobalamin (,VITAMIN B-12,) 1000 MCG/ML injection, INJECT 1 MILLILITER INTO THE SKIN EVERY 30 DAYS (Patient taking differently: Inject 1,000 mcg into the muscle every 30 (thirty) days. INJECT 1 MILLILITER INTO THE SKIN EVERY 30 DAYS)   ferrous sulfate 325 (65 FE) MG tablet, Take 325 mg by mouth 2 (two) times daily with a meal. Takes 143mg60m  Current Outpatient Medications (Other):    ALPRAZolam (XANAX) 0.25 MG tablet, TAKE 1/2 TO 1 TABLET AT BEDTIME ONLY IF NEEDED FOR SLEEP & PLEASE TRY TO LIMIT TO 5 DAYS /WEEK TO AVOID ADDICTION & DEMENTIA (Patient taking differently: Take 0.125-0.25 mg by mouth at bedtime as needed for sleep. Take 1/2 to 1 tablet    at Bedtime   ONLY       if needed for Sleep & please try to limit to 5 days /week to avoid Addiction & Dementia)   amitriptyline (ELAVIL) 50 MG tablet, Take 25 mg by mouth at bedtime.   Cholecalciferol (VITAMIN D-3) 5000  UNITS TABS, Take 5,000 Units by mouth daily.   omeprazole (PRILOSEC) 40 MG capsule, TAKE 1 CAPSULE DAILY TO PREVENT INDIGESTION & HEARTBURNT (Patient taking differently: Take 40 mg by mouth daily. Take 1 capsule Daily to Prevent Indigestion & HeartburnT)   Probiotic Product (RESTORA) CAPS, Take 1 capsule Daily   vitamin C (ASCORBIC ACID) 500 MG tablet, Take 500 mg by mouth daily.   Allergies  Allergen Reactions   Norvasc [Amlodipine Besylate]    Sulfa Antibiotics     Current Problems (verified) Patient Active Problem List   Diagnosis Date Noted   Peripheral edema 09/17/2020   BMI less than 19,adult 09/14/2020   Hyponatremia    Stage 3 chronic kidney disease ([place]) 09/12/2019   Malnutrition of mild degree ([place]) 11/18/2016   COPD (chronic obstructive pulmonary disease) ([place]) 07/10/2016   Hypothyroidism 06/28/2015   Macular degeneration 06/28/2015   Lower back pain 06/28/2015   Medication  management 12/05/2014   Hypertension    Hyperlipidemia, mixed    B12 deficiency anemia    Normochromic normocytic anemia    Gastroesophageal reflux disease    Vitamin D deficiency    Abnormal glucose     Screening Tests Immunization History  Administered Date(s) Administered   DTaP 05/20/2012   Fluad Quad(high Dose 65+) 08/13/2020   Influenza Whole 07/21/2013   Influenza, High Dose Seasonal PF 06/29/2014, 07/30/2015, 07/10/2016, 09/07/2017, 08/10/2018, 08/24/2019   PFIZER SARS-COV-2 Vaccination 01/01/2020, 01/31/2020   Pneumococcal Conjugate-13 05/29/2014   Pneumococcal Polysaccharide-23 05/07/2010, 12/05/2014   Tdap 05/20/2012   Zoster 11/03/2006   Preventative care: Last colonoscopy: 2010 will not get another Last mammogram: 2009 declines another Last pap smear/pelvic exam: remote DEXA: 2013 osteoporosis, declines another, would not take bisphosphonates  Prior vaccinations: TD or Tdap: 2013  Influenza: 2021 Pneumococcal: 2016 Prevnar13: 2015 Shingles/Zostavax: 2008 Covid 19: 2/2, 2021, pfizer  Names of Other Physician/Practitioners you currently use: 1. [facility] here for primary care 2. Dr. Madelyn Brunner, eye doctor, 09/2019, encouraged to schedule 3. Ronnell Freshwater, dentist, once a year, last 2021  Patient Care Team: Unk Pinto, MD as PCP - General (Internal Medicine) Laurence Spates, MD (Inactive) as Consulting Physician (Gastroenterology) Berenice Primas, MD as Referring Physician (Orthopedic Surgery) Warden Fillers, MD as Consulting Physician (Optometry)  SURGICAL HISTORY She  has a past surgical history that includes Knee arthroscopy (Left); Cataract extraction; Cholecystectomy (01/18/2019); and Cholecystectomy (N/A, 01/18/2019). FAMILY HISTORY Her family history includes COPD in her father; Heart attack in her brother and brother; Hypertension in her mother. SOCIAL HISTORY She  reports that she  has never smoked. She has never used smokeless tobacco. She reports that she does not drink alcohol and does not use drugs.   MEDICARE WELLNESS OBJECTIVES: Physical activity: Current Exercise Habits: Home exercise routine, Type of exercise: walking;strength training/weights, Time (Minutes): 15, Frequency (Times/Week): 7, Weekly Exercise (Minutes/Week): 105, Intensity: Mild, Exercise limited by: None identified Cardiac risk factors: Cardiac Risk Factors include: advanced age (>39mn, >>60women);dyslipidemia;hypertension;sedentary lifestyle Depression/mood screen:   [facility] 08/26/2020  Decreased Interest 0  Down, Depressed, Hopeless 0  PHQ - 2 Score 0    ADLs:  In your present state of health, do you have any difficulty performing the following activities: 09/17/2020 08/26/2020  Hearing? N N  Comment has bil hearing aides -  Vision? N N  Difficulty concentrating or making decisions? N N  Walking or climbing stairs? N N  Dressing or bathing? N N  Doing errands, shopping? N N  Some recent data might be hidden    Cognitive Testing  Alert? Yes  Normal Appearance?Yes  Oriented to person? Yes  Place? Yes   Time? Yes  Recall of three objects?  2/3  Can perform simple calculations? Yes  Displays appropriate judgment?Yes  Can read the correct time from a watch face?Yes  EOL planning: Does Patient Have a Medical Advance Directive?: Yes Type of Advance Directive: Healthcare Power of Attorney, Living will Does patient want to make changes to medical advance directive?: No - Patient declined Copy of [place] in Chart?: No - copy requested  Review of Systems  Constitutional: Negative for chills, fever and malaise/fatigue.  HENT: Negative for congestion, ear pain and sore throat.   Eyes: Negative.   Respiratory: Negative for cough, shortness of breath and wheezing.   Cardiovascular: Positive for leg swelling (intermittent in last month ). Negative for  chest pain and palpitations.  Gastrointestinal: Negative for abdominal pain, blood in stool, constipation, diarrhea, heartburn and melena.  Genitourinary: Negative.   Musculoskeletal: Positive for back pain (lower back ) and falls.  Skin: Negative.   Neurological: Negative for dizziness, sensory change, loss of consciousness and headaches.  Psychiatric/Behavioral: Negative for depression. The patient is not nervous/anxious and does not have insomnia.      Objective:     Today's Vitals   09/17/20 1147  BP: 140/62  Pulse: 83  Temp: 98.1 F (36.7 C)  SpO2: 99%  Weight: 102 lb 12.8 oz (46.6 kg)   Body mass index is 17.92 kg/m.  General appearance: alert, no distress, WD/WN,  female HEENT: Left eye ptosis, normocephalic, sclerae anicteric, TM pearly on left, right TM white/scarred or absent TM, nares patent, no discharge or erythema, pharynx normal Oral cavity: MMM, no lesions Neck: supple, no lymphadenopathy, no thyromegaly, no masses Heart: RRR, normal S1, S2, no murmurs Lungs: CTA bilaterally, no wheezes, rhonchi, or rales Abdomen: +bs, soft, non tender, non distended, no masses, no hepatomegaly, no splenomegaly Musculoskeletal: nontender, no effusion, no obvious deformity, mild- moderate cervical kyphosis Extremities: , no cyanosis, no clubbing, bil ankle pitting edema Pulses: 2+ symmetric, upper and lower extremities, normal cap refill Neurological: alert, oriented x 3, CN2-12 intact, strength normal upper extremities and lower extremities, sensation normal throughout, DTRs 2+ throughout, no cerebellar signs, gait normal Psychiatric: normal affect, behavior normal, pleasant   Medicare Attestation I have personally reviewed: The patient's medical and social history Their use of alcohol, tobacco or illicit drugs Their current medications and supplements The patient's functional ability including ADLs,fall risks, home safety risks, cognitive, and hearing and visual  impairment Diet and physical activities Evidence for depression or mood disorders  The patient's weight, height, BMI, and visual acuity have been recorded in the chart.  I have made referrals, counseling, and provided education to the patient based on review of the above and I have provided the patient with a written personalized care plan for preventive services.     Izora Ribas, NP   09/17/2020

## 2020-09-17 ENCOUNTER — Ambulatory Visit: Payer: Medicare Other | Admitting: Adult Health

## 2020-09-17 ENCOUNTER — Encounter: Payer: Self-pay | Admitting: Adult Health

## 2020-09-17 ENCOUNTER — Other Ambulatory Visit: Payer: Self-pay

## 2020-09-17 ENCOUNTER — Ambulatory Visit (INDEPENDENT_AMBULATORY_CARE_PROVIDER_SITE_OTHER): Payer: Medicare Other | Admitting: Adult Health

## 2020-09-17 VITALS — BP 140/62 | HR 83 | Temp 98.1°F | Wt 102.8 lb

## 2020-09-17 DIAGNOSIS — E782 Mixed hyperlipidemia: Secondary | ICD-10-CM

## 2020-09-17 DIAGNOSIS — Z79899 Other long term (current) drug therapy: Secondary | ICD-10-CM | POA: Diagnosis not present

## 2020-09-17 DIAGNOSIS — R609 Edema, unspecified: Secondary | ICD-10-CM

## 2020-09-17 DIAGNOSIS — K219 Gastro-esophageal reflux disease without esophagitis: Secondary | ICD-10-CM | POA: Diagnosis not present

## 2020-09-17 DIAGNOSIS — I10 Essential (primary) hypertension: Secondary | ICD-10-CM | POA: Diagnosis not present

## 2020-09-17 DIAGNOSIS — N1831 Chronic kidney disease, stage 3a: Secondary | ICD-10-CM

## 2020-09-17 DIAGNOSIS — D518 Other vitamin B12 deficiency anemias: Secondary | ICD-10-CM

## 2020-09-17 DIAGNOSIS — E559 Vitamin D deficiency, unspecified: Secondary | ICD-10-CM | POA: Diagnosis not present

## 2020-09-17 DIAGNOSIS — D649 Anemia, unspecified: Secondary | ICD-10-CM | POA: Diagnosis not present

## 2020-09-17 DIAGNOSIS — E039 Hypothyroidism, unspecified: Secondary | ICD-10-CM

## 2020-09-17 DIAGNOSIS — J449 Chronic obstructive pulmonary disease, unspecified: Secondary | ICD-10-CM | POA: Diagnosis not present

## 2020-09-17 DIAGNOSIS — E441 Mild protein-calorie malnutrition: Secondary | ICD-10-CM | POA: Diagnosis not present

## 2020-09-17 DIAGNOSIS — Z0000 Encounter for general adult medical examination without abnormal findings: Secondary | ICD-10-CM

## 2020-09-17 DIAGNOSIS — Z681 Body mass index (BMI) 19 or less, adult: Secondary | ICD-10-CM

## 2020-09-17 DIAGNOSIS — R7309 Other abnormal glucose: Secondary | ICD-10-CM

## 2020-09-17 DIAGNOSIS — H3530 Unspecified macular degeneration: Secondary | ICD-10-CM

## 2020-09-17 DIAGNOSIS — E871 Hypo-osmolality and hyponatremia: Secondary | ICD-10-CM | POA: Diagnosis not present

## 2020-09-17 MED ORDER — CYANOCOBALAMIN 1000 MCG/ML IJ SOLN
1000.0000 ug | Freq: Once | INTRAMUSCULAR | Status: AC
  Administered 2020-09-17: 1000 ug via INTRAMUSCULAR

## 2020-09-17 NOTE — Patient Instructions (Addendum)
Shelby Bradley , Thank you for taking time to come for your Medicare Wellness Visit. I appreciate your ongoing commitment to your health goals. Please review the following plan we discussed and let me know if I can assist you in the future.   These are the goals we discussed: Goals    . Exercise 3x per week (30 min per time)       This is a list of the screening recommended for you and due dates:  Health Maintenance  Topic Date Due  . Tetanus Vaccine  05/20/2022  . Flu Shot  Completed  . DEXA scan (bone density measurement)  Completed  . COVID-19 Vaccine  Completed  . Pneumonia vaccines  Completed     COMPRESSION STOCKINGS  HOME CARE INSTRUCTIONS   Do not stand or sit in one position for long periods of time. Do not sit with your legs crossed. Rest with your legs raised during the day.  Your legs have to be higher than your heart so that gravity will force the valves to open, so please really elevate your legs.   Wear elastic stockings or support hose. Do not wear other tight, encircling garments around the legs, pelvis, or waist.  [employer]  has a wide variety of well priced compression stockings. [address] [place], [place] [address] [phone]  Or Amazon has a good cheap selection, I like the socks, they are not as hard to get on  Walk as much as possible to increase blood flow.  Raise the foot of your bed at night with 2-inch blocks. SEEK MEDICAL CARE IF:   The skin around your ankle starts to break down.  You have pain, redness, tenderness, or hard swelling developing in your leg over a vein.  You are uncomfortable due to leg pain. Document Released: 07/30/2005 Document Revised: 01/12/2012 Document Reviewed: 12/16/2010 [facility] Patient Information 2014 [place], [place].     Edema  Edema is when you have too much fluid in your body or under your skin. Edema may make your legs, feet, and ankles swell up. Swelling is also common in looser tissues,  like around your eyes. This is a common condition. It gets more common as you get older. There are many possible causes of edema. Eating too much salt (sodium) and being on your feet or sitting for a long time can cause edema in your legs, feet, and ankles. Hot weather may make edema worse. Edema is usually painless. Your skin may look swollen or shiny. Follow these instructions at home:  Keep the swollen body part raised (elevated) above the level of your heart when you are sitting or lying down.  Do not sit still or stand for a long time.  Do not wear tight clothes. Do not wear garters on your upper legs.  Exercise your legs. This can help the swelling go down.  Wear elastic bandages or support stockings as told by your doctor.  Eat a low-salt (low-sodium) diet to reduce fluid as told by your doctor.  Depending on the cause of your swelling, you may need to limit how much fluid you drink (fluid restriction).  Take over-the-counter and prescription medicines only as told by your doctor. Contact a doctor if:  Treatment is not working.  You have heart, liver, or kidney disease and have symptoms of edema.  You have sudden and unexplained weight gain. Get help right away if:  You have shortness of breath or chest pain.  You  cannot breathe when you lie down.  You have pain, redness, or warmth in the swollen areas.  You have heart, liver, or kidney disease and get edema all of a sudden.  You have a fever and your symptoms get worse all of a sudden. Summary  Edema is when you have too much fluid in your body or under your skin.  Edema may make your legs, feet, and ankles swell up. Swelling is also common in looser tissues, like around your eyes.  Raise (elevate) the swollen body part above the level of your heart when you are sitting or lying down.  Follow your doctor's instructions about diet and how much fluid you can drink (fluid restriction). This information is not  intended to replace advice given to you by your health care provider. Make sure you discuss any questions you have with your health care provider. Document Revised: 10/23/2017 Document Reviewed: 11/07/2016 Elsevier Patient Education  2020 [employer].

## 2020-09-18 LAB — CBC WITH DIFFERENTIAL/PLATELET
Absolute Monocytes: 338 cells/uL (ref 200–950)
Basophils Absolute: 19 cells/uL (ref 0–200)
Basophils Relative: 0.4 %
Eosinophils Absolute: 19 cells/uL (ref 15–500)
Eosinophils Relative: 0.4 %
HCT: 28 % — ABNORMAL LOW (ref 35.0–45.0)
Hemoglobin: 9.4 g/dL — ABNORMAL LOW (ref 11.7–15.5)
Lymphs Abs: 992 cells/uL (ref 850–3900)
MCH: 30.9 pg (ref 27.0–33.0)
MCHC: 33.6 g/dL (ref 32.0–36.0)
MCV: 92.1 fL (ref 80.0–100.0)
MPV: 8.9 fL (ref 7.5–12.5)
Monocytes Relative: 7.2 %
Neutro Abs: 3332 cells/uL (ref 1500–7800)
Neutrophils Relative %: 70.9 %
Platelets: 209 10*3/uL (ref 140–400)
RBC: 3.04 10*6/uL — ABNORMAL LOW (ref 3.80–5.10)
RDW: 13.6 % (ref 11.0–15.0)
Total Lymphocyte: 21.1 %
WBC: 4.7 10*3/uL (ref 3.8–10.8)

## 2020-09-18 LAB — COMPLETE METABOLIC PANEL WITH GFR
AG Ratio: 1.8 (calc) (ref 1.0–2.5)
ALT: 14 U/L (ref 6–29)
AST: 20 U/L (ref 10–35)
Albumin: 3.8 g/dL (ref 3.6–5.1)
Alkaline phosphatase (APISO): 84 U/L (ref 37–153)
BUN: 20 mg/dL (ref 7–25)
CO2: 24 mmol/L (ref 20–32)
Calcium: 9.4 mg/dL (ref 8.6–10.4)
Chloride: 107 mmol/L (ref 98–110)
Creat: 0.81 mg/dL (ref 0.60–0.88)
GFR, Est African American: 74 mL/min/{1.73_m2} (ref >=60)
GFR, Est Non African American: 64 mL/min/{1.73_m2} (ref >=60)
Globulin: 2.1 g/dL (calc) (ref 1.9–3.7)
Glucose, Bld: 94 mg/dL (ref 65–99)
Potassium: 4.3 mmol/L (ref 3.5–5.3)
Sodium: 137 mmol/L (ref 135–146)
Total Bilirubin: 0.5 mg/dL (ref 0.2–1.2)
Total Protein: 5.9 g/dL — ABNORMAL LOW (ref 6.1–8.1)

## 2020-09-18 LAB — MAGNESIUM: Magnesium: 1.8 mg/dL (ref 1.5–2.5)

## 2020-09-25 ENCOUNTER — Ambulatory Visit: Payer: Medicare Other | Admitting: Adult Health Nurse Practitioner

## 2020-10-18 ENCOUNTER — Ambulatory Visit: Payer: Medicare Other

## 2020-10-22 ENCOUNTER — Ambulatory Visit: Payer: Medicare Other | Admitting: *Deleted

## 2020-10-22 ENCOUNTER — Other Ambulatory Visit: Payer: Self-pay

## 2020-11-25 ENCOUNTER — Other Ambulatory Visit: Payer: Self-pay | Admitting: Internal Medicine

## 2020-11-28 ENCOUNTER — Other Ambulatory Visit: Payer: Self-pay | Admitting: Adult Health

## 2020-11-28 ENCOUNTER — Other Ambulatory Visit: Payer: Self-pay

## 2020-11-28 ENCOUNTER — Ambulatory Visit (INDEPENDENT_AMBULATORY_CARE_PROVIDER_SITE_OTHER): Payer: Medicare Other | Admitting: Adult Health

## 2020-11-28 ENCOUNTER — Encounter: Payer: Self-pay | Admitting: Adult Health

## 2020-11-28 VITALS — BP 180/60 | HR 94 | Temp 97.7°F | Ht 62.5 in | Wt 101.4 lb

## 2020-11-28 DIAGNOSIS — Z79899 Other long term (current) drug therapy: Secondary | ICD-10-CM

## 2020-11-28 DIAGNOSIS — E441 Mild protein-calorie malnutrition: Secondary | ICD-10-CM

## 2020-11-28 DIAGNOSIS — R609 Edema, unspecified: Secondary | ICD-10-CM

## 2020-11-28 DIAGNOSIS — E538 Deficiency of other specified B group vitamins: Secondary | ICD-10-CM | POA: Diagnosis not present

## 2020-11-28 DIAGNOSIS — J449 Chronic obstructive pulmonary disease, unspecified: Secondary | ICD-10-CM

## 2020-11-28 DIAGNOSIS — Z0000 Encounter for general adult medical examination without abnormal findings: Secondary | ICD-10-CM

## 2020-11-28 DIAGNOSIS — R0989 Other specified symptoms and signs involving the circulatory and respiratory systems: Secondary | ICD-10-CM | POA: Diagnosis not present

## 2020-11-28 DIAGNOSIS — N1831 Chronic kidney disease, stage 3a: Secondary | ICD-10-CM

## 2020-11-28 DIAGNOSIS — H3530 Unspecified macular degeneration: Secondary | ICD-10-CM

## 2020-11-28 DIAGNOSIS — Z681 Body mass index (BMI) 19 or less, adult: Secondary | ICD-10-CM

## 2020-11-28 DIAGNOSIS — E039 Hypothyroidism, unspecified: Secondary | ICD-10-CM

## 2020-11-28 DIAGNOSIS — E611 Iron deficiency: Secondary | ICD-10-CM | POA: Diagnosis not present

## 2020-11-28 DIAGNOSIS — R600 Localized edema: Secondary | ICD-10-CM

## 2020-11-28 DIAGNOSIS — K219 Gastro-esophageal reflux disease without esophagitis: Secondary | ICD-10-CM | POA: Diagnosis not present

## 2020-11-28 DIAGNOSIS — E782 Mixed hyperlipidemia: Secondary | ICD-10-CM

## 2020-11-28 DIAGNOSIS — R7309 Other abnormal glucose: Secondary | ICD-10-CM | POA: Diagnosis not present

## 2020-11-28 MED ORDER — CLONIDINE HCL 0.1 MG PO TABS: ORAL_TABLET | ORAL | 0 refills | Status: DC

## 2020-11-28 NOTE — Progress Notes (Signed)
CPE  Assessment:   Encounter for Annual wellness exam 1 year  Essential hypertension Typically well controlled without medications, labile; not on daily agent due to age and risk of hypotension;  Atypically elevated today with some hypertensive urgency; given clonidine 0.1 mg BID PRN for 150/90+, check rotating BPs 3-4 times a day and keep log, close follow up next week to review and evaluate need for daily agent - consider diuretic per edema persistent and renals are stable or improved - DASH diet, exercise and monitor at home. Call if greater than 150/90.  -     CMP/GFR  Chronic obstructive pulmonary disease, unspecified COPD type ([place]) Avoid triggers, continue allergy pill  Mixed hyperlipidemia -decrease fatty foods, increase activity.   Hypothyroidism, unspecified type Hypothyroidism-check TSH level, continue medications the same, reminded to take on an empty stomach 30-72mns before food.   Malnutrition of mild degree (HCC) Continue protein/weight gain/ensure -     CMP/GFR  Other abnormal glucose Monitor  Medication management -     Magnesium  vitamin B12 deficiency, unspecified B12 deficiency type Continue injections  Anemia, unspecified type Monitor, essentially stable from baseline; continue B12/iron supplements  Vitamin D deficiency Continue supplement  Macular degeneration, unspecified laterality, unspecified type Continue follow up Dr. MZigmund Daniel Gastroesophageal reflux disease, esophagitis presence not specified Continue PPI/H2 blocker, diet discussed  BMI < 19 Continue ensure, good progress with gaining weight   Hyperkalemia Resolved, NO MORE NO SALT SUPPLEMENT Monitor for recurrence   Hyponatremia - CMP/GFR  Peripheral edema No other concerning signs of fluid overload or CHF Check labs - elevate legs, avoid sitting with legs dependent, increase activity, increase water, decrease sodium intake (admits to atypica increased intake of pepperoni  pizza in recent weeks). Recommended wrap on compression hose daily in AM. Close follow up after the weekend. Consider diuretic if renals stable and persistent edema not improving with lifestyle changes.    Orders Placed This Encounter  Procedures  . CBC with Differential/Platelet  . COMPLETE METABOLIC PANEL WITH GFR  . Magnesium  . Lipid panel  . TSH  . Hemoglobin A1c  . Iron,Total/Total Iron Binding Cap  . Microalbumin / creatinine urine ratio  . Urinalysis, Routine w reflex microscopic  . EKG 12-Lead    Over 40 minutes of exam, counseling, chart review and critical decision making was performed Future Appointments  Date Time Provider [place] 12/03/2020 11:45 AM CLiane Comber NP [facility] None  09/17/2021 10:30 AM CLiane Comber NP [facility] None  11/28/2021  2:00 PM CLiane Comber NP [facility] None     Plan:   During the course of the visit the patient was educated and counseled about appropriate screening and preventive services including:    Pneumococcal vaccine   Prevnar 13  Influenza vaccine  Td vaccine  Screening electrocardiogram  Bone densitometry screening  Colorectal cancer screening  Diabetes screening  Glaucoma screening  Nutrition counseling   Advanced directives: requested   Subjective:  Shelby VERGEis a 87y.o. female who presents for CPE. She has Labile hypertension; Hyperlipidemia, mixed; B12 deficiency; Normochromic normocytic anemia; Gastroesophageal reflux disease; Vitamin D deficiency; Abnormal glucose; Medication management; Hypothyroidism; Macular degeneration; Lower back pain; COPD (chronic obstructive pulmonary disease) ([place]; Malnutrition of mild degree ([place]; Stage 3 chronic kidney disease ([place]; Hyponatremia; BMI less than 19,adult; Peripheral edema; and Iron deficiency on their problem list.   She is widowed, 2 children, 1 grandchild by blood, numerous step grandchildren and great grandchilren. She is a  retired  [employer] account executive. Her daughter accompanies her today.   She was recently evaluated in ED 08/2020 for Hyponatremia (128) & Hyperkalemia (6.9) . Patient was treated with IV fluids for rehydration and correction of electrolyte abnormalities. Patient notes has had some swelling in her ankles when dependent since then. Resolves by AM. Denies dyspnea, fatigue, PND, coughing. Has pull on compression hose but struggles to get them on.   She has chronic hearing loss in left ear from mastoid sx when she was younger, wears hearing aids. She lives by herself, daughter/son and close by and check on her regularly. Ambulates with a cane, carries cell phone with her at all times. Drives very short distances if needed, but daughter typically accompanies her.   Follows up with Dr. Katy Fitch, and he sent her to Dr. Rodman Key for [facility] degeneration, improved with injections and has since been released. She is overdue to follow up with Dr. Katy Fitch.   She takes amitriptyline 1/2 pill at night for sleep. She will take xanax 0.87m occ during the day for anxiety, will take 1/2 at least once a week, needing less recently.   BMI is Body mass index is 18.25 kg/m., she is working on diet and exercise, she is working on weight gain/being stable. She is on boost/ensure 1 daily and doing well. She walks daily with cane to mail box, 5-10 min daily, also also works on balance exercises. Wt Readings from Last 3 Encounters:  11/28/20 101 lb 6.4 oz (46 kg)  09/17/20 102 lb 12.8 oz (46.6 kg)  08/23/20 98 lb 3.2 oz (44.5 kg)   Her blood pressure has been controlled at home, today their BP is BP: (!) 180/60, 190/90 on manual recheck by provider.   She does workout. She denies chest pain, shortness of breath, dizziness, HA, vision changes.    She is not on cholesterol medication and denies myalgias. Her cholesterol is at goal. The cholesterol last visit was:   Lab Results  Component Value Date   CHOL 141 11/28/2020   HDL  60 11/28/2020   LDLCALC 65 11/28/2020   TRIG 78 11/28/2020   CHOLHDL 2.4 11/28/2020   She does not have diabetes, has CKD due to HTN and age. Last GFR was: Lab Results  Component Value Date   GFRNONAA 52 (L) 11/28/2020   GFRNONAA 64 09/17/2020   GFRNONAA 48 (L) 08/23/2020   Patient is on Vitamin D supplement, taking 5000 IU daily  Lab Results  Component Value Date   VD25OH 86403/06/2020     She is on thyroid medication. Her medication was not changed last visit, 1 pill daily of the 100 mcg daily except 1.5 tab on Wed and Sun. Lab Results  Component Value Date   TSH 5.15 (H) 11/28/2020   On review baseline hemoglobin has been in 9-10 range She had reticulocyctes checked by Dr. MMelford Aaseon 08/23/2020 that showed appropriate responese CBC Latest Ref Rng & Units 11/28/2020 09/17/2020 08/23/2020  WBC 3.8 - 10.8 Thousand/uL 6.1 4.7 5.0  Hemoglobin 11.7 - 15.5 g/dL 9.2(L) 9.4(L) 9.1(L)  Hematocrit 35.0 - 45.0 % 27.5(L) 28.0(L) 28.3(L)  Platelets 140 - 400 Thousand/uL 222 209 234   She has had intermittent iron def, on iron supplement, taking 143 mg BID since last visit with vitamin C Lab Results  Component Value Date   IRON 24 (L) 11/28/2020   TIBC 212 (L) 11/28/2020   FERRITIN 148 07/16/2020   She has been getting monthly b12 injections -  Lab  Results  Component Value Date   VITAMINB12 >2,000 (H) 03/06/2020     Medication Review:  Current Outpatient Medications (Endocrine & Metabolic):  .  levothyroxine (SYNTHROID) 50 MCG tablet, Take 2-3 tablets (100-150 mcg total) by mouth daily before breakfast. Take 1 tablet daily as directed on an empty stomach with only water for 30 minutes & no Antacid meds, Calcium or Magnesium for 4 hours & avoid Biotin 133mg daily except 1568m on MWF  Current Outpatient Medications (Cardiovascular):  .  cloNIDine (CATAPRES) 0.1 MG tablet, Take 1 tab twice daily as needed only if blood pressure is above 150/90.    Current Outpatient  Medications (Hematological):  .  cyanocobalamin (,VITAMIN B-12,) 1000 MCG/ML injection, INJECT 1 MILLILITER INTO THE SKIN EVERY 30 DAYS (Patient taking differently: Inject 1,000 mcg into the muscle every 30 (thirty) days. INJECT 1 MILLILITER INTO THE SKIN EVERY 30 DAYS) .  ferrous sulfate 325 (65 FE) MG tablet, Take 325 mg by mouth 2 (two) times daily with a meal. Takes 14374mID  Current Outpatient Medications (Other):  .  amitriptyline (ELAVIL) 50 MG tablet, Take 25 mg by mouth at bedtime. .  Cholecalciferol (VITAMIN D-3) 5000 UNITS TABS, Take 5,000 Units by mouth daily. .  Marland Kitchenmeprazole (PRILOSEC) 40 MG capsule, TAKE 1 CAPSULE DAILY TO PREVENT INDIGESTION & HEARTBURNT (Patient taking differently: Take 40 mg by mouth daily. Take 1 capsule Daily to Prevent Indigestion & HeartburnT) .  Probiotic Product (RESTORA) CAPS, Take   1 tablet   Daily  for Intestinal HealthT .  vitamin C (ASCORBIC ACID) 500 MG tablet, Take 500 mg by mouth daily. .  Marland KitchenLPRAZolam (XANAX) 0.25 MG tablet, TAKE 1/2 TO 1 TABLET AT BEDTIME ONLY IF NEEDED FOR SLEEP & PLEASE TRY TO LIMIT TO 5 DAYS /WEEK TO AVOID ADDICTION & DEMENTIA (Patient not taking: Reported on 11/28/2020)   Allergies  Allergen Reactions  . Norvasc [Amlodipine Besylate]   . Sulfa Antibiotics     Current Problems (verified) Patient Active Problem List   Diagnosis Date Noted  . Iron deficiency 11/28/2020  . Peripheral edema 09/17/2020  . BMI less than 19,adult 09/14/2020  . Hyponatremia   . Stage 3 chronic kidney disease ([place]CC[place]1/07/2019  . Malnutrition of mild degree ([place]CC[place]1/16/2018  . COPD (chronic obstructive pulmonary disease) ([place]CC[place]9/05/2016  . Hypothyroidism 06/28/2015  . Macular degeneration 06/28/2015  . Lower back pain 06/28/2015  . Medication management 12/05/2014  . Labile hypertension   . Hyperlipidemia, mixed   . B12 deficiency   . Normochromic normocytic anemia   . Gastroesophageal reflux disease   . Vitamin D deficiency   . Abnormal  glucose     Screening Tests Immunization History  Administered Date(s) Administered  . DTaP 05/20/2012  . Fluad Quad(high Dose 65+) 08/13/2020  . Influenza Whole 07/21/2013  . Influenza, High Dose Seasonal PF 06/29/2014, 07/30/2015, 07/10/2016, 09/07/2017, 08/10/2018, 08/24/2019  . PFIZER(Purple Top)SARS-COV-2 Vaccination 01/01/2020, 01/31/2020  . Pneumococcal Conjugate-13 05/29/2014  . Pneumococcal Polysaccharide-23 05/07/2010, 12/05/2014  . Tdap 05/20/2012  . Zoster 11/03/2006   Preventative care: Last colonoscopy: 2010 will not get another Last mammogram: 2009 declines another Last pap smear/pelvic exam: remote DEXA: 2013 osteoporosis, declines another, would not take bisphosphonates  Prior vaccinations: TD or Tdap: 2013  Influenza: 08/2020 Pneumococcal: 2016 Prevnar13: 2015 Shingles/Zostavax: 2008 Covid 19: 2/2, 2021, pfizer   Names of Other Physician/Practitioners you currently use: 1. [facility] here for primary care 2. Dr. GroMadelyn Brunnerye doctor, 09/2019, encouraged to  schedule 3. Ronnell Freshwater, dentist, once a year, last 2021  Patient Care Team: Unk Pinto, MD as PCP - General (Internal Medicine) Laurence Spates, MD (Inactive) as Consulting Physician (Gastroenterology) Berenice Primas, MD as Referring Physician (Orthopedic Surgery) Warden Fillers, MD as Consulting Physician (Optometry)  SURGICAL HISTORY She  has a past surgical history that includes Cataract extraction (Bilateral); Cholecystectomy (N/A, 01/18/2019); and ORIF distal femur fracture (Left). FAMILY HISTORY Her family history includes COPD in her father; Heart attack in her brother and brother; Hypertension in her mother. SOCIAL HISTORY She  reports that she has never smoked. She has never used smokeless tobacco. She reports that she does not drink alcohol and does not use drugs.   Review of Systems  Constitutional: Negative for chills, fever and  malaise/fatigue.  HENT: Negative for congestion, ear pain and sore throat.   Eyes: Negative.   Respiratory: Negative for cough, shortness of breath and wheezing.   Cardiovascular: Positive for leg swelling (dependent edema, daily last few months). Negative for chest pain and palpitations.  Gastrointestinal: Negative for abdominal pain, blood in stool, constipation, diarrhea, heartburn and melena.  Genitourinary: Negative.   Musculoskeletal: Negative for back pain and falls.  Skin: Negative.   Neurological: Negative for dizziness, sensory change, loss of consciousness and headaches.  Psychiatric/Behavioral: Negative for depression. The patient is not nervous/anxious and does not have insomnia.      Objective:     Today's Vitals   11/28/20 1402  BP: (!) 180/60  Pulse: 94  Temp: 97.7 F (36.5 C)  SpO2: 97%  Weight: 101 lb 6.4 oz (46 kg)  Height: 5' 2.5" (1.588 m)   Body mass index is 18.25 kg/m.  General appearance: alert, no distress, WD/WN,  female HEENT: Left eye ptosis, normocephalic, sclerae anicteric, TM pearly on left, right TM white/scarred or absent TM, nares patent, no discharge or erythema, pharynx normal Oral cavity: MMM, no lesions Neck: supple, no lymphadenopathy, no thyromegaly, no masses Heart: RRR, normal S1, S2, no murmurs Lungs: CTA bilaterally, no wheezes, rhonchi, or rales Abdomen: +bs, soft, non tender, non distended, no masses, no hepatomegaly, no splenomegaly Musculoskeletal: nontender, no effusion, no obvious deformity, mild- moderate cervical kyphosis Extremities: , no cyanosis, no clubbing, bil lower leg 2+ pitting edema throughout Pulses: 2+ symmetric, upper and lower extremities, normal cap refill Neurological: alert, oriented x 3, CN2-12 intact, strength normal upper extremities and lower extremities, sensation normal throughout, DTRs 2+ throughout, no cerebellar signs, gait slow steady with cane Psychiatric: normal affect, behavior normal, pleasant   Breasts: declines GU: declines  EKG: NSR   Izora Ribas, NP   11/29/2020

## 2020-11-28 NOTE — Progress Notes (Signed)
B12, 49mL administered in left deltoid without any complications.

## 2020-11-28 NOTE — Patient Instructions (Addendum)
Shelby Bradley , Thank you for taking time to come for your Medicare Wellness Visit. I appreciate your ongoing commitment to your health goals. Please review the following plan we discussed and let me know if I can assist you in the future.   These are the goals we discussed: Goals    . Blood Pressure < 150/90    . Exercise 3x per week (30 min per time)       This is a list of the screening recommended for you and due dates:  Health Maintenance  Topic Date Due  . COVID-19 Vaccine (3 - Booster for Pfizer series) 08/02/2020  . Tetanus Vaccine  05/20/2022  . Flu Shot  Completed  . DEXA scan (bone density measurement)  Completed  . Pneumonia vaccines  Completed     Please avoid salt and processed foods (pepperoni pizza), drink plenty of fluids  Go home and elevate legs above level of heart as much as possible Avoid sitting with legs down for extended periods Get up and walk 5 min every hour Pump ankles to help calf muscles move blood  Please pick up wrap on compression hose this afternoon and start applying daily in the morning when legs are not yet swollen to help prevent excess swelling  Please still put up legs with compression on   Please check blood pressure 3-4 times a day for the next few days   Take clonidine 0.1 mg twice daily ONLY if blood pressure is above 150/90  Close follow up early next week and if persistently running high and labs look ok will start on a daily medication      Edema  Edema is an abnormal buildup of fluids in the body tissues and under the skin. Swelling of the legs, feet, and ankles is a common symptom that becomes more likely as you get older. Swelling is also common in looser tissues, like around the eyes. When the affected area is squeezed, the fluid may move out of that spot and leave a dent for a few moments. This dent is called pitting edema. There are many possible causes of edema. Eating too much salt (sodium) and being on your feet or  sitting for a long time can cause edema in your legs, feet, and ankles. Hot weather may make edema worse. Common causes of edema include:  Heart failure.  Liver or kidney disease.  Weak leg blood vessels.  Cancer.  An injury.  Pregnancy.  Medicines.  Being obese.  Low protein levels in the blood. Edema is usually painless. Your skin may look swollen or shiny. Follow these instructions at home:  Keep the affected body part raised (elevated) above the level of your heart when you are sitting or lying down.  Do not sit still or stand for long periods of time.  Do not wear tight clothing. Do not wear garters on your upper legs.  Exercise your legs to get your circulation going. This helps to move the fluid back into your blood vessels, and it may help the swelling go down.  Wear elastic bandages or support stockings to reduce swelling as told by your health care provider.  Eat a low-salt (low-sodium) diet to reduce fluid as told by your health care provider.  Depending on the cause of your swelling, you may need to limit how much fluid you drink (fluid restriction).  Take over-the-counter and prescription medicines only as told by your health care provider. Contact a health care provider if:  Your edema does not get better with treatment.  You have heart, liver, or kidney disease and have symptoms of edema.  You have sudden and unexplained weight gain. Get help right away if:  You develop shortness of breath or chest pain.  You cannot breathe when you lie down.  You develop pain, redness, or warmth in the swollen areas.  You have heart, liver, or kidney disease and suddenly get edema.  You have a fever and your symptoms suddenly get worse. Summary  Edema is an abnormal buildup of fluids in the body tissues and under the skin.  Eating too much salt (sodium) and being on your feet or sitting for a long time can cause edema in your legs, feet, and ankles.  Keep  the affected body part raised (elevated) above the level of your heart when you are sitting or lying down. This information is not intended to replace advice given to you by your health care provider. Make sure you discuss any questions you have with your health care provider. Document Revised: 03/09/2019 Document Reviewed: 11/22/2016 Elsevier Patient Education  2021 Elsevier Inc.      Clonidine Oral Tablets What is this medicine? CLONIDINE (KLOE ni deen) is an alpha agonist. It treats high blood pressure. This medicine may be used for other purposes; ask your health care provider or pharmacist if you have questions. COMMON BRAND NAME(S): Catapres What should I tell my health care provider before I take this medicine? They need to know if you have any of these conditions:  kidney disease  an unusual or allergic reaction to clonidine, other medicines, foods, dyes, or preservatives  pregnant or trying to get pregnant  breast-feeding How should I use this medicine? Take this drug by mouth. Take it as directed on the prescription label at the same time every day. You can take it with or without food. If it upsets your stomach, take it with food. Keep taking it unless your health care provider tells you to stop. Talk to your health care provider about the use of this drug in children. Special care may be needed. Overdosage: If you think you have taken too much of this medicine contact a poison control center or emergency room at once. NOTE: This medicine is only for you. Do not share this medicine with others. What if I miss a dose? If you miss a dose, take it as soon as you can. If it is almost time for your next dose, take only that dose. Do not take double or extra doses. What may interact with this medicine? Do not take this medicine with any of the following medications:  MAOIs like Carbex, Eldepryl, Marplan, Nardil, and Parnate This medicine may also interact with the following  medications:  barbiturate medicines for inducing sleep or treating seizures like phenobarbital  certain medicines for blood pressure, heart disease, irregular heart beat  certain medicines for depression, anxiety, or psychotic disturbances  prescription pain medicines This list may not describe all possible interactions. Give your health care provider a list of all the medicines, herbs, non-prescription drugs, or dietary supplements you use. Also tell them if you smoke, drink alcohol, or use illegal drugs. Some items may interact with your medicine. What should I watch for while using this medicine? Visit your doctor or health care professional for regular checks on your progress. Check your heart rate and blood pressure regularly while you are taking this medicine. Ask your doctor or health care professional what your heart  rate should be and when you should contact him or her. You may get drowsy or dizzy. Do not drive, use machinery, or do anything that needs mental alertness until you know how this medicine affects you. To avoid dizzy or fainting spells, do not stand or sit up quickly, especially if you are an older person. Alcohol can make you more drowsy and dizzy. Avoid alcoholic drinks. Your mouth may get dry. Chewing sugarless gum or sucking hard candy, and drinking plenty of water will help. Do not treat yourself for coughs, colds, or pain while you are taking this medicine without asking your doctor or health care professional for advice. Some ingredients may increase your blood pressure. If you are going to have surgery tell your doctor or health care professional that you are taking this medicine. What side effects may I notice from receiving this medicine? Side effects that you should report to your doctor or health care professional as soon as possible:  allergic reactions like skin rash, itching or hives, swelling of the face, lips, or tongue  anxiety, nervousness  chest  pain  depression  fast, irregular heartbeat  swelling of feet or legs  unusually weak or tired Side effects that usually do not require medical attention (report to your doctor or health care professional if they continue or are bothersome):  change in sex drive or performance  constipation  headache This list may not describe all possible side effects. Call your doctor for medical advice about side effects. You may report side effects to FDA at 1-800-FDA-1088. Where should I keep my medicine? Keep out of the reach of children and pets. Store at room temperature between 15 and 30 degrees C (59 and 86 degrees F). Throw away any unused drug after the expiration date. NOTE: This sheet is a summary. It may not cover all possible information. If you have questions about this medicine, talk to your doctor, pharmacist, or health care provider.  2021 Elsevier/Gold Standard (2019-06-28 16:38:11)

## 2020-11-29 ENCOUNTER — Encounter: Payer: Self-pay | Admitting: Adult Health

## 2020-11-29 ENCOUNTER — Other Ambulatory Visit: Payer: Self-pay | Admitting: Adult Health

## 2020-11-29 DIAGNOSIS — E039 Hypothyroidism, unspecified: Secondary | ICD-10-CM

## 2020-11-29 LAB — COMPLETE METABOLIC PANEL WITH GFR
AG Ratio: 1.4 (calc) (ref 1.0–2.5)
ALT: 11 U/L (ref 6–29)
AST: 22 U/L (ref 10–35)
Albumin: 3.7 g/dL (ref 3.6–5.1)
Alkaline phosphatase (APISO): 106 U/L (ref 37–153)
BUN/Creatinine Ratio: 26 (calc) — ABNORMAL HIGH (ref 6–22)
BUN: 25 mg/dL (ref 7–25)
CO2: 26 mmol/L (ref 20–32)
Calcium: 9.1 mg/dL (ref 8.6–10.4)
Chloride: 105 mmol/L (ref 98–110)
Creat: 0.96 mg/dL — ABNORMAL HIGH (ref 0.60–0.88)
GFR, Est African American: 60 mL/min/{1.73_m2} (ref >=60)
GFR, Est Non African American: 52 mL/min/{1.73_m2} — ABNORMAL LOW (ref >=60)
Globulin: 2.6 g/dL (calc) (ref 1.9–3.7)
Glucose, Bld: 96 mg/dL (ref 65–99)
Potassium: 3.9 mmol/L (ref 3.5–5.3)
Sodium: 137 mmol/L (ref 135–146)
Total Bilirubin: 0.5 mg/dL (ref 0.2–1.2)
Total Protein: 6.3 g/dL (ref 6.1–8.1)

## 2020-11-29 LAB — CBC WITH DIFFERENTIAL/PLATELET
Absolute Monocytes: 451 cells/uL (ref 200–950)
Basophils Absolute: 31 cells/uL (ref 0–200)
Basophils Relative: 0.5 %
Eosinophils Absolute: 18 cells/uL (ref 15–500)
Eosinophils Relative: 0.3 %
HCT: 27.5 % — ABNORMAL LOW (ref 35.0–45.0)
Hemoglobin: 9.2 g/dL — ABNORMAL LOW (ref 11.7–15.5)
Lymphs Abs: 1074 cells/uL (ref 850–3900)
MCH: 30.8 pg (ref 27.0–33.0)
MCHC: 33.5 g/dL (ref 32.0–36.0)
MCV: 92 fL (ref 80.0–100.0)
MPV: 9.4 fL (ref 7.5–12.5)
Monocytes Relative: 7.4 %
Neutro Abs: 4526 cells/uL (ref 1500–7800)
Neutrophils Relative %: 74.2 %
Platelets: 222 10*3/uL (ref 140–400)
RBC: 2.99 10*6/uL — ABNORMAL LOW (ref 3.80–5.10)
RDW: 12.7 % (ref 11.0–15.0)
Total Lymphocyte: 17.6 %
WBC: 6.1 10*3/uL (ref 3.8–10.8)

## 2020-11-29 LAB — URINALYSIS, ROUTINE W REFLEX MICROSCOPIC
Bacteria, UA: NONE SEEN /HPF
Bilirubin Urine: NEGATIVE
Glucose, UA: NEGATIVE
Hyaline Cast: NONE SEEN /LPF
Ketones, ur: NEGATIVE
Leukocytes,Ua: NEGATIVE
Nitrite: NEGATIVE
Protein, ur: NEGATIVE
Specific Gravity, Urine: 1.008 (ref 1.001–1.03)
Squamous Epithelial / HPF: NONE SEEN /HPF (ref <=5)
WBC, UA: NONE SEEN /HPF (ref 0–5)
pH: 5.5 (ref 5.0–8.0)

## 2020-11-29 LAB — IRON, TOTAL/TOTAL IRON BINDING CAP
%SAT: 11 % (calc) — ABNORMAL LOW (ref 16–45)
Iron: 24 ug/dL — ABNORMAL LOW (ref 45–160)
TIBC: 212 mcg/dL (calc) — ABNORMAL LOW (ref 250–450)

## 2020-11-29 LAB — MICROALBUMIN / CREATININE URINE RATIO
Creatinine, Urine: 31 mg/dL (ref 20–275)
Microalb Creat Ratio: 210 mcg/mg creat — ABNORMAL HIGH (ref <30)
Microalb, Ur: 6.5 mg/dL

## 2020-11-29 LAB — MAGNESIUM: Magnesium: 1.7 mg/dL (ref 1.5–2.5)

## 2020-11-29 LAB — LIPID PANEL
Cholesterol: 141 mg/dL (ref <200)
HDL: 60 mg/dL (ref >=50)
LDL Cholesterol (Calc): 65 mg/dL (calc)
Non-HDL Cholesterol (Calc): 81 mg/dL (calc) (ref <130)
Total CHOL/HDL Ratio: 2.4 (calc) (ref <5.0)
Triglycerides: 78 mg/dL (ref <150)

## 2020-11-29 LAB — TSH: TSH: 5.15 mIU/L — ABNORMAL HIGH (ref 0.40–4.50)

## 2020-11-29 LAB — HEMOGLOBIN A1C
Hgb A1c MFr Bld: 5.2 % of total Hgb (ref <5.7)
Mean Plasma Glucose: 103 mg/dL
eAG (mmol/L): 5.7 mmol/L

## 2020-11-29 MED ORDER — LEVOTHYROXINE SODIUM 50 MCG PO TABS: 100.0000 ug | ORAL_TABLET | Freq: Every day | ORAL | 1 refills | Status: DC

## 2020-12-03 ENCOUNTER — Ambulatory Visit (INDEPENDENT_AMBULATORY_CARE_PROVIDER_SITE_OTHER): Payer: Medicare Other | Admitting: Adult Health

## 2020-12-03 ENCOUNTER — Encounter: Payer: Self-pay | Admitting: Adult Health

## 2020-12-03 ENCOUNTER — Other Ambulatory Visit: Payer: Self-pay

## 2020-12-03 VITALS — BP 178/70 | HR 66 | Temp 97.3°F | Wt 108.0 lb

## 2020-12-03 DIAGNOSIS — D649 Anemia, unspecified: Secondary | ICD-10-CM | POA: Diagnosis not present

## 2020-12-03 DIAGNOSIS — E611 Iron deficiency: Secondary | ICD-10-CM

## 2020-12-03 DIAGNOSIS — K644 Residual hemorrhoidal skin tags: Secondary | ICD-10-CM | POA: Diagnosis not present

## 2020-12-03 DIAGNOSIS — I10 Essential (primary) hypertension: Secondary | ICD-10-CM

## 2020-12-03 DIAGNOSIS — R609 Edema, unspecified: Secondary | ICD-10-CM

## 2020-12-03 LAB — POC HEMOCCULT BLD/STL (OFFICE/1-CARD/DIAGNOSTIC): Fecal Occult Blood, POC: NEGATIVE

## 2020-12-03 MED ORDER — LOSARTAN POTASSIUM 25 MG PO TABS: 25.0000 mg | ORAL_TABLET | Freq: Every day | ORAL | 1 refills | Status: DC

## 2020-12-03 NOTE — Patient Instructions (Signed)
Can take 1-2 extra strenth tylenol (up to 1000 mg) three times a day as needed for aches and pains  Can try voltaren gel (os aspercreme) 3-4 times a day   Start losartan 1/2 tab daily for 1 week (works best in the evening) and check daily blood pressure for goal of <140/90  STOP if any blood pressures <110/60 and call   Can increase to whole tab in 1 week if needed for blood pressure goal   Very large hemorrhoid; monitor stools closely for red blood      Losartan Tablets What is this medicine? LOSARTAN (loe SAR tan) is an angiotensin II receptor blocker, also known as an ARB. It treats high blood pressure. It can slow kidney damage in some patients. It may also be used to lower the risk of stroke. This medicine may be used for other purposes; ask your health care provider or pharmacist if you have questions. COMMON BRAND NAME(S): Cozaar What should I tell my health care provider before I take this medicine? They need to know if you have any of these conditions:  heart failure  kidney disease  liver disease  an unusual or allergic reaction to losartan, other medicines, foods, dyes, or preservatives  pregnant or trying to get pregnant  breast-feeding How should I use this medicine? Take this medicine by mouth. Take it as directed on the prescription label at the same time every day. You can take it with or without food. If it upsets your stomach, take it with food. Keep taking it unless your health care provider tells you to stop. Talk to your health care provider about the use of this medicine in children. While it may be prescribed for children as young as 6 for selected conditions, precautions do apply. Overdosage: If you think you have taken too much of this medicine contact a poison control center or emergency room at once. NOTE: This medicine is only for you. Do not share this medicine with others. What if I miss a dose? If you miss a dose, take it as soon as you can. If  it is almost time for your next dose, take only that dose. Do not take double or extra doses. What may interact with this medicine?  aliskiren  ACE inhibitors, like enalapril or lisinopril  diuretics, especially amiloride, eplerenone, spironolactone, or triamterene  lithium  NSAIDs, medicines for pain and inflammation, like ibuprofen or naproxen  potassium salts or potassium supplements This list may not describe all possible interactions. Give your health care provider a list of all the medicines, herbs, non-prescription drugs, or dietary supplements you use. Also tell them if you smoke, drink alcohol, or use illegal drugs. Some items may interact with your medicine. What should I watch for while using this medicine? Visit your health care provider for regular check ups. Check your blood pressure as directed. Ask your health care provider what your blood pressure should be. Also, find out when you should contact him or her. Do not treat yourself for coughs, colds, or pain while you are using this medicine without asking your health care provider for advice. Some medicines may increase your blood pressure. Women should inform their health care provider if they wish to become pregnant or think they might be pregnant. There is a potential for serious side effects to an unborn child. Talk to your health care provider for more information. You may get drowsy or dizzy. Do not drive, use machinery, or do anything that needs mental  alertness until you know how this medicine affects you. Do not stand or sit up quickly, especially if you are an older patient. This reduces the risk of dizzy or fainting spells. Alcohol can make you more drowsy and dizzy. Avoid alcoholic drinks. Avoid salt substitutes unless you are told otherwise by your health care provider. What side effects may I notice from receiving this medicine? Side effects that you should report to your doctor or health care professional as soon  as possible:  allergic reactions (skin rash, itching or hives, swelling of the hands, feet, face, lips, throat, or tongue)  breathing problems  high potassium levels (chest pain; or fast, irregular heartbeat; muscle weakness)  kidney injury (trouble passing urine or change in the amount of urine)  low blood pressure (dizziness; feeling faint or lightheaded, falls; unusually weak or tired) Side effects that usually do not require medical attention (report to your doctor or health care professional if they continue or are bothersome):  cough  headache  nasal congestion or stuffiness  nausea or stomach pain This list may not describe all possible side effects. Call your doctor for medical advice about side effects. You may report side effects to FDA at 1-800-FDA-1088. Where should I keep my medicine? Keep out of the reach of children and pets. Store at room temperature between 20 and 25 degrees C (68 and 77 degrees F). Protect from light. Keep the container tightly closed. Get rid of any unused medicine after the expiration date. To get rid of medicines that are no longer needed or have expired:  Take the medicine to a medicine take-back program. Check with your pharmacy or law enforcement to find a location.  If you cannot return the medicine, check the label or package insert to see if the medicine should be thrown out in the garbage or flushed down the toilet. If you are not sure, ask your health care provider. If it is safe to put in the trash, empty the medicine out of the container. Mix the medicine with cat litter, dirt, coffee grounds, or other unwanted substance. Seal the mixture in a bag or container. Put it in the trash. NOTE: This sheet is a summary. It may not cover all possible information. If you have questions about this medicine, talk to your doctor, pharmacist, or health care provider.  2021 Elsevier/Gold Standard (2019-12-29 16:16:09)

## 2020-12-03 NOTE — Progress Notes (Signed)
Assessment and Plan:  Shelby Bradley was seen today for follow-up.  Diagnoses and all orders for this visit:  Hypertension, unspecified type Start losartan 1/2 tab daily for 1 week then increase to whole tab if tolerated and needed for BP goal  She prefers to avoid diuretic which was discussed due to dependent edema;  has tolerated lisinopril well in the past other than dry cough Monitor blood pressure at home; call if consistently over 150/90 Stop/hold med if any BPs <110/60 or dizziness or fatigue Continue DASH diet.   Reminder to go to the ER if any CP, SOB, nausea, dizziness, severe HA, changes vision/speech, left arm numbness and tingling and jaw pain. Follow up 1 month for recheck and BMP/GFR or sooner if concerns Daughter will assist with close monitoring -     losartan (COZAAR) 25 MG tablet; Take 1 tablet (25 mg total) by mouth daily.  Peripheral edema Normal labs; no CHF sx; suspect venous insufficiency with excess sodium intake Labs were normal  - elevate legs TID, increase activity, increase water, decrease sodium intake.  Wear compression socks more routinely if available. Return to the office if no change with symptoms or any worsening.   External hemorrhoids Large non-bleeding hemorrhoids; she denies any bleeding sx Neg guaiac today Increase fiber, increase water, can sit on donut if discomfort If anemia/iron def not improving consider GI referral for further treatment  Iron deficiency Restart iron supplement with vitamin C as was recommended Recheck CBC next visit Hematology if trending down any further or poor response with oral iron supplement  Anemia, unspecified type Chronic, predates 2011; had been in hgb 10 range, down to 9 range since 2018 Remote colonoscopies apparently normal other than hemorroids Restart iron; monitor closely If trending down further will refer to hematology, consider GI if persistent iron def with large hemorroid possibly intermittently bleeding   Recheck CBC 1 month   Further disposition pending results of labs. Discussed med's effects and SE's.   Over 30 minutes of exam, counseling, chart review, and critical decision making was performed.   Future Appointments  Date Time Provider Department Center  01/08/2021 11:00 AM Judd Gaudier, NP [facility] None  09/17/2021 10:30 AM Judd Gaudier, NP [facility] None  11/28/2021  2:00 PM Judd Gaudier, NP [facility] None    ------------------------------------------------------------------------------------------------------------------   HPI 87 y.o.female presents for follow up on labile hypertension with urgency last week, also persistent iron deficiency anemia.   She was seen 11/28/2020 for CPE, noted BP of 180/60, recheck 190/90 by provider. History of htn, recently labile/lower and has been off of med. She was not checking BPs at home. Denied HA, vision changes, CP, dyspnea. She was prescribed clonidine to take PRN if 150/90+ and check rotating BPs; home log demonstrates typically  ranging 130/70-150/80 with rare up to 170-180, has taken clonidine 3 times since last week. Reports historically did well with lisinopril for many years other than cough.   She has persistent dependent peripheral edema since 09/2021 without hx or sx of CHF;  She admits to excess sodium intake and has been working to reduce intake Wrap on compression was recommended last visit; cost prohibitive; alternative options discussed She prefers to avoid diuretics  Her blood pressure has not been controlled at home, today their BP is BP: (!) 178/70  She does not workout. She denies chest pain, shortness of breath, dizziness.  She does have CKD III monitored here;  Lab Results  Component Value Date   GFRNONAA 52 (L) 11/28/2020  GFRNONAA 64 09/17/2020   GFRNONAA 48 (L) 08/23/2020   She has persistent anemia with hemoglobin in 9 range over recent checks; previous iron check on 08/13/2020 was low and was  recommended iron supplement; recheck iron 11/28/2020 showed further decline and recommended DRE/hemoccult today. Today her daughter shares she admitted was not in fact taking iron supplement. Denies any intolerance of supplement; she denies red blood in stool or dark/tarry stools.  CBC Latest Ref Rng & Units 11/28/2020 09/17/2020 08/23/2020  WBC 3.8 - 10.8 Thousand/uL 6.1 4.7 5.0  Hemoglobin 11.7 - 15.5 g/dL 5.5(H) 7.4(B) 6.3(A)  Hematocrit 35.0 - 45.0 % 27.5(L) 28.0(L) 28.3(L)  Platelets 140 - 400 Thousand/uL 222 209 234   Lab Results  Component Value Date   IRON 24 (L) 11/28/2020   TIBC 212 (L) 11/28/2020   FERRITIN 148 07/16/2020      Past Medical History:  Diagnosis Date  . Acute cholecystitis 01/18/2019  . Acute kidney injury (nontraumatic) (HCC)   . Allergy   . Anemia   . Anxiety   . DDD (degenerative disc disease), lumbar   . Depression   . GERD (gastroesophageal reflux disease)   . Hyperlipidemia   . Hypertension   . OA (osteoarthritis) of knee    right knee  . Pre-diabetes   . Thyroid disease   . Vitamin D deficiency      Allergies  Allergen Reactions  . Norvasc [Amlodipine Besylate]   . Sulfa Antibiotics     Current Outpatient Medications on File Prior to Visit  Medication Sig  . amitriptyline (ELAVIL) 50 MG tablet Take 25 mg by mouth at bedtime.  . Cholecalciferol (VITAMIN D-3) 5000 UNITS TABS Take 5,000 Units by mouth daily.  . cyanocobalamin (,VITAMIN B-12,) 1000 MCG/ML injection INJECT 1 MILLILITER INTO THE SKIN EVERY 30 DAYS (Patient taking differently: Inject 1,000 mcg into the muscle every 30 (thirty) days. INJECT 1 MILLILITER INTO THE SKIN EVERY 30 DAYS)  . ferrous sulfate 325 (65 FE) MG tablet Take 325 mg by mouth 2 (two) times daily with a meal. Takes 143mg  BID  . levothyroxine (SYNTHROID) 50 MCG tablet Take 2-3 tablets (100-150 mcg total) by mouth daily before breakfast. Take 1 tablet daily as directed on an empty stomach with only water for 30 minutes  & no Antacid meds, Calcium or Magnesium for 4 hours & avoid Biotin daily except on MWF  . omeprazole (PRILOSEC) 40 MG capsule TAKE 1 CAPSULE DAILY TO PREVENT INDIGESTION & HEARTBURNT (Patient taking differently: Take 40 mg by mouth daily. Take 1 capsule Daily to Prevent Indigestion & HeartburnT)  . Probiotic Product (RESTORA) CAPS Take   1 tablet   Daily  for Intestinal HealthT  . vitamin C (ASCORBIC ACID) 500 MG tablet Take 500 mg by mouth daily.  ALPRAZolam (XANAX) 0.25 MG tablet TAKE 1/2 TO 1 TABLET AT BEDTIME ONLY IF NEEDED FOR SLEEP & PLEASE TRY TO LIMIT TO 5 DAYS /WEEK TO AVOID ADDICTION & DEMENTIA (Patient not taking: Reported on 12/03/2020)   No current facility-administered medications on file prior to visit.    ROS: all negative except above.   Physical Exam:  BP (!) 178/70   Pulse 66   Temp (!) 97.3 F (36.3 C)   Wt 108 lb (49 kg)   SpO2 99%   BMI 19.44 kg/m   General Appearance: Thin/frail elder female, well dressed, in no apparent distress. Eyes: PERRLA, conjunctiva no swelling or erythema ENT/Mouth: Ext aud canals clear, TMs without erythema,  bulging. No erythema, swelling, or exudate on post pharynx.  Tonsils not swollen or erythematous. Hearing normal.  Neck: Supple, thyroid normal.  Respiratory: Respiratory effort normal, BS equal bilaterally without rales, rhonchi, wheezing or stridor.  Cardio: RRR with no MRGs. Brisk peripheral pulses with 2+ pitting edema bil lower legs and ankles Abdomen: Soft, + BS.  Non tender, no guarding, rebound, hernias, masses. Lymphatics: Non tender without lymphadenopathy.  Musculoskeletal: Mild/mod kyphosis; slow steady gait;   Skin: Warm, dry without rashes, lesions, ecchymosis.  Neuro: Cranial nerves intact. Normal muscle tone, no cerebellar symptoms. Sensation intact.  Psych: Awake and oriented X 3, normal affect, Insight and Judgment appropriate.  Rectal: Rectal exam: no tenderness noted, no palpable mass, large  external hemorrhoids without bleeding noted, stool guaiac negative, mildly loose rectal tone.     Dan Maker, NP 12:56 PM [facility]

## 2020-12-25 ENCOUNTER — Other Ambulatory Visit: Payer: Self-pay | Admitting: Adult Health

## 2020-12-31 ENCOUNTER — Other Ambulatory Visit: Payer: Self-pay | Admitting: Internal Medicine

## 2020-12-31 DIAGNOSIS — E039 Hypothyroidism, unspecified: Secondary | ICD-10-CM

## 2021-01-07 NOTE — Progress Notes (Deleted)
Assessment and Plan:  1. Labile hypertension ***  2. External hemorrhoids ***  3. Iron deficiency ***  4. Normochromic normocytic anemia ***  5. B12 deficiency ***     Further disposition pending results of labs. Discussed med's effects and SE's.   Over 30 minutes of exam, counseling, chart review, and critical decision making was performed.   Future Appointments  Date Time Provider Department Center  01/08/2021 11:00 AM Judd Gaudier, NP [facility] None  09/17/2021 10:30 AM Judd Gaudier, NP [facility] None  11/28/2021  2:00 PM Judd Gaudier, NP [facility] None    ------------------------------------------------------------------------------------------------------------------   HPI 87 y.o.female presents for   External hemorrhoids Large non-bleeding hemorrhoids; she denies any bleeding sx Neg guaiac today Increase fiber, increase water, can sit on donut if discomfort If anemia/iron def not improving consider GI referral for further treatment  Iron deficiency Restart iron supplement with vitamin C as was recommended Recheck CBC next visit Hematology if trending down any further or poor response with oral iron supplement  Anemia, unspecified type Chronic, predates 2011; had been in hgb 10 range, down to 9 range since 2018 Remote colonoscopies apparently normal other than hemorroids Restart iron; monitor closely If trending down further will refer to hematology, consider GI if persistent iron def with large hemorroid possibly intermittently bleeding  Recheck CBC 1 month     Past Medical History:  Diagnosis Date  . Acute cholecystitis 01/18/2019  . Acute kidney injury (nontraumatic) (HCC)   . Allergy   . Anemia   . Anxiety   . DDD (degenerative disc disease), lumbar   . Depression   . GERD (gastroesophageal reflux disease)   . Hyperlipidemia   . Hypertension   . OA (osteoarthritis) of knee    right knee  . Pre-diabetes   . Thyroid disease   .  Vitamin D deficiency      Allergies  Allergen Reactions  . Norvasc [Amlodipine Besylate]   . Sulfa Antibiotics     Current Outpatient Medications on File Prior to Visit  Medication Sig  . ALPRAZolam (XANAX) 0.25 MG tablet TAKE 1/2 TO 1 TABLET AT BEDTIME ONLY IF NEEDED FOR SLEEP & PLEASE TRY TO LIMIT TO 5 DAYS /WEEK TO AVOID ADDICTION & DEMENTIA (Patient not taking: Reported on 12/03/2020)  . amitriptyline (ELAVIL) 50 MG tablet Take 25 mg by mouth at bedtime.  . Cholecalciferol (VITAMIN D-3) 5000 UNITS TABS Take 5,000 Units by mouth daily.  . cyanocobalamin (,VITAMIN B-12,) 1000 MCG/ML injection INJECT 1 MILLILITER INTO THE SKIN EVERY 30 DAYS (Patient taking differently: Inject 1,000 mcg into the muscle every 30 (thirty) days. INJECT 1 MILLILITER INTO THE SKIN EVERY 30 DAYS)  . ferrous sulfate 325 (65 FE) MG tablet Take 325 mg by mouth 2 (two) times daily with a meal. Takes 143mg  BID  . levothyroxine (SYNTHROID) 50 MCG tablet TAKE 2-3 TABS BY MOUTH DAILY BEFORE BREAKFAST. TAKE 1 TABLET DAILY AS DIRECTED ON EMPTY STOMACH W/ ONLY WATER FOR 30 MINUTES & NO ANTACID MEDS, CALCIUM OR MAGNESIUM FOR 4 HOURS & AVOID BIOTIN DAILY EXCEPT ON SUNDAY & WEDNESDAY  . losartan (COZAAR) 25 MG tablet Take 1 tablet (25 mg total) by mouth daily.  omeprazole (PRILOSEC) 40 MG capsule TAKE 1 CAPSULE DAILY TO PREVENT INDIGESTION & HEARTBURNT (Patient taking differently: Take 40 mg by mouth daily. Take 1 capsule Daily to Prevent Indigestion & HeartburnT)  . Probiotic Product (RESTORA) CAPS Take   1 tablet   Daily  for Intestinal HealthT  .  vitamin C (ASCORBIC ACID) 500 MG tablet Take 500 mg by mouth daily.   No current facility-administered medications on file prior to visit.    ROS: all negative except above.   Physical Exam:  There were no vitals taken for this visit.  General Appearance: Well nourished, in no apparent distress. Eyes: PERRLA, EOMs, conjunctiva no swelling or erythema Sinuses:  No Frontal/maxillary tenderness ENT/Mouth: Ext aud canals clear, TMs without erythema, bulging. No erythema, swelling, or exudate on post pharynx.  Tonsils not swollen or erythematous. Hearing normal.  Neck: Supple, thyroid normal.  Respiratory: Respiratory effort normal, BS equal bilaterally without rales, rhonchi, wheezing or stridor.  Cardio: RRR with no MRGs. Brisk peripheral pulses without edema.  Abdomen: Soft, + BS.  Non tender, no guarding, rebound, hernias, masses. Lymphatics: Non tender without lymphadenopathy.  Musculoskeletal: Full ROM, 5/5 strength, normal gait.  Skin: Warm, dry without rashes, lesions, ecchymosis.  Neuro: Cranial nerves intact. Normal muscle tone, no cerebellar symptoms. Sensation intact.  Psych: Awake and oriented X 3, normal affect, Insight and Judgment appropriate.     Dan Maker, NP 1:50 PM [facility]

## 2021-01-07 NOTE — Progress Notes (Signed)
Assessment and Plan:  Shelby Bradley was seen today for follow-up.  Diagnoses and all orders for this visit:  Hypertension, labile Increase losartan 25 mg tab - 1.5 tab x 2 weeks, then increase to 2 tabs if persists above goal  Monitor blood pressure at home; call if consistently over 150/90 Stop/hold med if any BPs <110/70 or dizziness or fatigue Continue DASH diet.   Reminder to go to the ER if any CP, SOB, nausea, dizziness, severe HA, changes vision/speech, left arm numbness and tingling and jaw pain. Follow up 1 month for recheck and BMP/GFR or sooner if concerns Daughter will assist with close monitoring -     losartan (COZAAR) 25 MG tablet; Take 1-2 tablet (25 mg total) by mouth daily for BP goal <150/90   Iron deficiency Has been on iron supplement 65 mg slow release daily and tolerating well  Recheck CBC and iron Hematology if trending down any further or poor response with oral iron supplement   Anemia, unspecified type Chronic, predates 2011; had been in hgb 10 range, down to 9 range since 2018 Remote colonoscopies apparently normal other than hemorroids Restart iron; monitor closely If trending down further will refer to hematology, consider GI if persistent iron def with large hemorroid possibly intermittently bleeding   CKD III/microalbuminuria (HCC) CKD may be contributing to anemia; recheck labs today Recheck microalbuminuria that was elevated at CPE for persistence  Continue losartan/ARB and tighter BP control if possible  R knee swelling Known arthritis; no injury; improved from yesterday Applied ACE wrap, advised RICE, can do tylenol, topical aspercreme  Follow up if not improving as expected  Further disposition pending results of labs. Discussed med's effects and SE's.   Over 30 minutes of exam, counseling, chart review, and critical decision making was performed.   Future Appointments  Date Time Provider Department Center  02/11/2021  2:30 PM [facility] NURSE  [facility] None  04/17/2021  3:30 PM Judd Gaudier, NP [facility] None  09/17/2021 10:30 AM Judd Gaudier, NP [facility] None  11/28/2021  2:00 PM Judd Gaudier, NP [facility] None    ------------------------------------------------------------------------------------------------------------------   HPI Pulse 82    Temp (!) 97.5 F (36.4 C)    Wt 103 lb (46.7 kg)    SpO2 96%    BMI 18.54 kg/m   87 y.o.female presents for follow up on labile hypertension, also persistent iron deficiency anemia and due for B12 injection.   She was seen 11/28/2020 for CPE, noted BP of 180/60, recheck 190/90 by provider. History of htn, recently labile/lower and has been off of med. She was not checking BPs at home. Persistent on follow up, was initiated on losartan after she reported historically did well with lisinopril for many years other than cough. She has been taking 25 mg daily with home log demonstrating 130/70-160/90 range.   Today their BP is BP: (!) 178/60, similar by recheck  She denies chest pain, shortness of breath, dizziness.   She had persistent dependent peripheral edema since 09/2021 without hx or sx of CHF; improved since starting losartan and reducing sodium intake. Preferred to avoid diuretic.   She has chronic/persistent normocytic anemia for many years, with hemoglobin stable in low 9 range over recent checks; previous iron check on 08/13/2020 was low and was recommended iron supplement; recheck iron 11/28/2020 showed further decline and recommended DRE/hemoccult which was negative, exam showed large external hemorroid without evidence of bleeding. Her daughter shares she admitted was not in fact taking iron supplement at that time.  She denies red blood in stool or dark/tarry stools.  She has been on iron supplement since, taking 65 mg slow release with orange juice and tolerating well.  Her last colonoscopy was in 2010, appears was benign other than external hemorrhoids without  further follow up recommended due to benign history and age.  CBC Latest Ref Rng & Units 11/28/2020 09/17/2020 08/23/2020  WBC 3.8 - 10.8 Thousand/uL 6.1 4.7 5.0  Hemoglobin 11.7 - 15.5 g/dL 0.9(N) 2.3(F) 5.7(D)  Hematocrit 35.0 - 45.0 % 27.5(L) 28.0(L) 28.3(L)  Platelets 140 - 400 Thousand/uL 222 209 234   Lab Results  Component Value Date   IRON 24 (L) 11/28/2020   TIBC 212 (L) 11/28/2020   FERRITIN 148 07/16/2020   She is on thyroid medication. Her medication was changed last visit.  She is taking 5 mcg tabs, takes 150 mg 3 days a week and 100 mcg all other days. 30-60 min prior to other meds.  Lab Results  Component Value Date   TSH 5.15 (H) 11/28/2020     Past Medical History:  Diagnosis Date   Acute cholecystitis 01/18/2019   Acute kidney injury (nontraumatic) (HCC)    Allergy    Anemia    Anxiety    DDD (degenerative disc disease), lumbar    Depression    GERD (gastroesophageal reflux disease)    Hyperlipidemia    Hypertension    OA (osteoarthritis) of knee    right knee   Pre-diabetes    Thyroid disease    Vitamin D deficiency      Allergies  Allergen Reactions   Norvasc [Amlodipine Besylate]    Sulfa Antibiotics     Current Outpatient Medications on File Prior to Visit  Medication Sig   ALPRAZolam (XANAX) 0.25 MG tablet TAKE 1/2 TO 1 TABLET AT BEDTIME ONLY IF NEEDED FOR SLEEP & PLEASE TRY TO LIMIT TO 5 DAYS /WEEK TO AVOID ADDICTION & DEMENTIA (Patient taking differently: Take 1/2 to 1 tablet    at Bedtime   ONLY       if needed for Sleep & please try to limit to 5 days /week to avoid Addiction & Dementia)   amitriptyline (ELAVIL) 50 MG tablet Take 25 mg by mouth at bedtime.   cyanocobalamin (,VITAMIN B-12,) 1000 MCG/ML injection INJECT 1 MILLILITER INTO THE SKIN EVERY 30 DAYS (Patient taking differently: Inject 1,000 mcg into the muscle every 30 (thirty) days. INJECT 1 MILLILITER INTO THE SKIN EVERY 30 DAYS)   ferrous sulfate 325 (65 FE) MG  tablet Take 325 mg by mouth daily. Takes 143mg  BID   levothyroxine (SYNTHROID) 50 MCG tablet TAKE 2-3 TABS BY MOUTH DAILY BEFORE BREAKFAST. TAKE 1 TABLET DAILY AS DIRECTED ON EMPTY STOMACH W/ ONLY WATER FOR 30 MINUTES & NO ANTACID MEDS, CALCIUM OR MAGNESIUM FOR 4 HOURS & AVOID BIOTIN DAILY EXCEPT ON SUNDAY & WEDNESDAY   Probiotic Product (RESTORA) CAPS Take   1 tablet   Daily  for Intestinal HealthT   Cholecalciferol (VITAMIN D-3) 5000 UNITS TABS Take 5,000 Units by mouth daily. (Patient not taking: Reported on 01/08/2021)   omeprazole (PRILOSEC) 40 MG capsule TAKE 1 CAPSULE DAILY TO PREVENT INDIGESTION & HEARTBURNT (Patient not taking: Reported on 01/08/2021)   No current facility-administered medications on file prior to visit.    ROS: all negative except above.   Physical Exam:  Pulse 82    Temp (!) 97.5 F (36.4 C)    Wt 103 lb (46.7 kg)  SpO2 96%    BMI 18.54 kg/m   General Appearance: Thin/frail elder female, well dressed, in no apparent distress. Eyes: PERRLA, conjunctiva no swelling or erythema ENT/Mouth: Ext aud canals clear, TMs without erythema, bulging. No erythema, swelling, or exudate on post pharynx.  Tonsils not swollen or erythematous. Hearing normal.  Neck: Supple, thyroid normal.  Respiratory: Respiratory effort normal, BS equal bilaterally without rales, rhonchi, wheezing or stridor.  Cardio: RRR with no MRGs. Brisk peripheral pulses without edema.   Abdomen: Soft, + BS.  Non tender, no guarding, rebound, hernias, masses. Lymphatics: Non tender without lymphadenopathy.  Musculoskeletal: Mild/mod kyphosis; slow steady gait with cane;  R knee with mild effusion without erythema (improved from yesterday),  Skin: Warm, dry without rashes, lesions, ecchymosis.  Neuro: Cranial nerves intact. Normal muscle tone, no cerebellar symptoms. Sensation intact.  Psych: Awake and oriented X 3, normal affect, Insight and Judgment appropriate.     Dan Maker,  NP 12:33 PM [facility]

## 2021-01-08 ENCOUNTER — Encounter: Payer: Self-pay | Admitting: Adult Health

## 2021-01-08 ENCOUNTER — Other Ambulatory Visit: Payer: Self-pay

## 2021-01-08 ENCOUNTER — Ambulatory Visit (INDEPENDENT_AMBULATORY_CARE_PROVIDER_SITE_OTHER): Payer: Medicare Other | Admitting: Adult Health

## 2021-01-08 VITALS — BP 178/60 | HR 82 | Temp 97.5°F | Wt 103.0 lb

## 2021-01-08 DIAGNOSIS — R809 Proteinuria, unspecified: Secondary | ICD-10-CM | POA: Diagnosis not present

## 2021-01-08 DIAGNOSIS — D649 Anemia, unspecified: Secondary | ICD-10-CM

## 2021-01-08 DIAGNOSIS — R0989 Other specified symptoms and signs involving the circulatory and respiratory systems: Secondary | ICD-10-CM | POA: Diagnosis not present

## 2021-01-08 DIAGNOSIS — E538 Deficiency of other specified B group vitamins: Secondary | ICD-10-CM

## 2021-01-08 DIAGNOSIS — E039 Hypothyroidism, unspecified: Secondary | ICD-10-CM

## 2021-01-08 DIAGNOSIS — K644 Residual hemorrhoidal skin tags: Secondary | ICD-10-CM

## 2021-01-08 DIAGNOSIS — N1831 Chronic kidney disease, stage 3a: Secondary | ICD-10-CM | POA: Diagnosis not present

## 2021-01-08 DIAGNOSIS — E611 Iron deficiency: Secondary | ICD-10-CM | POA: Diagnosis not present

## 2021-01-08 MED ORDER — CYANOCOBALAMIN 1000 MCG/ML IJ SOLN
1000.0000 ug | Freq: Once | INTRAMUSCULAR | Status: AC
  Administered 2021-01-08: 1000 ug via INTRAMUSCULAR

## 2021-01-08 MED ORDER — LOSARTAN POTASSIUM 25 MG PO TABS: 25.0000 mg | ORAL_TABLET | Freq: Every day | ORAL | 0 refills | Status: DC

## 2021-01-08 NOTE — Progress Notes (Signed)
B12 injection given in left deltoid. Patient tolerated well.

## 2021-01-08 NOTE — Patient Instructions (Addendum)
Goals    . Blood Pressure < 150/90    . Exercise 3x per week (30 min per time)      Wrap knee, wear support if needed, ice and aspercreme 3-4 times daily  Please increase losartan to 1.5 tabs for 2 weeks, increase to 2 tabs if frequently 150/90+  Do not increase if having any low - <110/70   Acute Knee Pain, Adult Many things can cause knee pain. Sometimes, knee pain is sudden (acute) and may be caused by damage, swelling, or irritation of the muscles and tissues that support your knee. The pain often goes away on its own with time and rest. If the pain does not go away, tests may be done to find out what is causing the pain. Follow these instructions at home: If you have a knee sleeve or brace:  Wear the knee sleeve or brace as told by your doctor. Take it off only as told by your doctor.  Loosen it if your toes: ? Tingle. ? Become numb. ? Turn cold and blue.  Keep it clean.  If the knee sleeve or brace is not waterproof: ? Do not let it get wet. ? Cover it with a watertight covering when you take a bath or shower.   Activity  Rest your knee.  Do not do things that cause pain or make pain worse.  Avoid activities where both feet leave the ground at the same time (high-impact activities). Examples are running, jumping rope, and doing jumping jacks.  Work with a physical therapist to make a safe exercise program, as told by your doctor. Managing pain, stiffness, and swelling  If told, put ice on the knee. To do this: ? If you have a removable knee sleeve or brace, take it off as told by your doctor. ? Put ice in a plastic bag. ? Place a towel between your skin and the bag. ? Leave the ice on for 20 minutes, 2-3 times a day. ? Take off the ice if your skin turns bright red. This is very important. If you cannot feel pain, heat, or cold, you have a greater risk of damage to the area.  If told, use an elastic bandage to put pressure (compression) on your injured  knee.  Raise your knee above the level of your heart while you are sitting or lying down.  Sleep with a pillow under your knee.   General instructions  Take over-the-counter and prescription medicines only as told by your doctor.  Do not smoke or use any products that contain nicotine or tobacco. If you need help quitting, ask your doctor.  If you are overweight, work with your doctor and a food expert (dietitian) to set goals to lose weight. Being overweight can make your knee hurt more.  Watch for any changes in your symptoms.  Keep all follow-up visits. Contact a doctor if:  The knee pain does not stop.  The knee pain changes or gets worse.  You have a fever along with knee pain.  Your knee is red or feels warm when you touch it.  Your knee gives out or locks up. Get help right away if:  Your knee swells, and the swelling gets worse.  You cannot move your knee.  You have very bad knee pain that does not get better with pain medicine. Summary  Many things can cause knee pain. The pain often goes away on its own with time and rest.  Your doctor may do  tests to find out the cause of the pain.  Watch for any changes in your symptoms. Relieve your pain with rest, medicines, light activity, and use of ice.  Get help right away if you cannot move your knee or your knee pain is very bad. This information is not intended to replace advice given to you by your health care provider. Make sure you discuss any questions you have with your health care provider. Document Revised: 04/04/2020 Document Reviewed: 04/04/2020 Elsevier Patient Education  2021 [employer].

## 2021-01-09 LAB — MICROALBUMIN / CREATININE URINE RATIO
Creatinine, Urine: 60 mg/dL (ref 20–275)
Microalb Creat Ratio: 107 mcg/mg creat — ABNORMAL HIGH (ref <30)
Microalb, Ur: 6.4 mg/dL

## 2021-01-09 LAB — BASIC METABOLIC PANEL WITH GFR
BUN/Creatinine Ratio: 28 (calc) — ABNORMAL HIGH (ref 6–22)
BUN: 27 mg/dL — ABNORMAL HIGH (ref 7–25)
CO2: 24 mmol/L (ref 20–32)
Calcium: 9.5 mg/dL (ref 8.6–10.4)
Chloride: 106 mmol/L (ref 98–110)
Creat: 0.95 mg/dL — ABNORMAL HIGH (ref 0.60–0.88)
GFR, Est African American: 61 mL/min/{1.73_m2} (ref >=60)
GFR, Est Non African American: 53 mL/min/{1.73_m2} — ABNORMAL LOW (ref >=60)
Glucose, Bld: 99 mg/dL (ref 65–99)
Potassium: 3.9 mmol/L (ref 3.5–5.3)
Sodium: 139 mmol/L (ref 135–146)

## 2021-01-09 LAB — IRON, TOTAL/TOTAL IRON BINDING CAP
%SAT: 5 % (calc) — ABNORMAL LOW (ref 16–45)
Iron: 11 ug/dL — ABNORMAL LOW (ref 45–160)
TIBC: 237 mcg/dL (calc) — ABNORMAL LOW (ref 250–450)

## 2021-01-09 LAB — CBC WITH DIFFERENTIAL/PLATELET
Absolute Monocytes: 680 cells/uL (ref 200–950)
Basophils Absolute: 32 cells/uL (ref 0–200)
Basophils Relative: 0.4 %
Eosinophils Absolute: 0 cells/uL — ABNORMAL LOW (ref 15–500)
Eosinophils Relative: 0 %
HCT: 30.2 % — ABNORMAL LOW (ref 35.0–45.0)
Hemoglobin: 10.1 g/dL — ABNORMAL LOW (ref 11.7–15.5)
Lymphs Abs: 896 cells/uL (ref 850–3900)
MCH: 30.7 pg (ref 27.0–33.0)
MCHC: 33.4 g/dL (ref 32.0–36.0)
MCV: 91.8 fL (ref 80.0–100.0)
MPV: 9.8 fL (ref 7.5–12.5)
Monocytes Relative: 8.5 %
Neutro Abs: 6392 cells/uL (ref 1500–7800)
Neutrophils Relative %: 79.9 %
Platelets: 197 10*3/uL (ref 140–400)
RBC: 3.29 10*6/uL — ABNORMAL LOW (ref 3.80–5.10)
RDW: 13.1 % (ref 11.0–15.0)
Total Lymphocyte: 11.2 %
WBC: 8 10*3/uL (ref 3.8–10.8)

## 2021-01-09 LAB — TSH: TSH: 2.19 mIU/L (ref 0.40–4.50)

## 2021-01-23 ENCOUNTER — Telehealth: Payer: Self-pay

## 2021-01-23 NOTE — Telephone Encounter (Signed)
[name], patient's daughter is concerned about her mother being anxious and wants to make sure it's not a side effect of the new BP meds.

## 2021-01-24 ENCOUNTER — Other Ambulatory Visit: Payer: Self-pay | Admitting: Internal Medicine

## 2021-01-24 ENCOUNTER — Other Ambulatory Visit: Payer: Self-pay

## 2021-01-24 ENCOUNTER — Ambulatory Visit (INDEPENDENT_AMBULATORY_CARE_PROVIDER_SITE_OTHER): Payer: Medicare Other

## 2021-01-24 VITALS — BP 158/80 | HR 80 | Temp 97.7°F | Wt 103.0 lb

## 2021-01-24 DIAGNOSIS — Z79899 Other long term (current) drug therapy: Secondary | ICD-10-CM

## 2021-01-24 DIAGNOSIS — E611 Iron deficiency: Secondary | ICD-10-CM | POA: Diagnosis not present

## 2021-01-24 DIAGNOSIS — D508 Other iron deficiency anemias: Secondary | ICD-10-CM | POA: Diagnosis not present

## 2021-01-24 DIAGNOSIS — E039 Hypothyroidism, unspecified: Secondary | ICD-10-CM | POA: Diagnosis not present

## 2021-01-24 DIAGNOSIS — E785 Hyperlipidemia, unspecified: Secondary | ICD-10-CM | POA: Diagnosis not present

## 2021-01-24 DIAGNOSIS — E871 Hypo-osmolality and hyponatremia: Secondary | ICD-10-CM | POA: Diagnosis not present

## 2021-01-24 DIAGNOSIS — D519 Vitamin B12 deficiency anemia, unspecified: Secondary | ICD-10-CM | POA: Diagnosis not present

## 2021-01-24 DIAGNOSIS — E441 Mild protein-calorie malnutrition: Secondary | ICD-10-CM | POA: Diagnosis not present

## 2021-01-24 DIAGNOSIS — E860 Dehydration: Secondary | ICD-10-CM | POA: Diagnosis not present

## 2021-01-24 DIAGNOSIS — E782 Mixed hyperlipidemia: Secondary | ICD-10-CM | POA: Diagnosis not present

## 2021-01-24 DIAGNOSIS — E538 Deficiency of other specified B group vitamins: Secondary | ICD-10-CM | POA: Diagnosis not present

## 2021-01-24 DIAGNOSIS — D649 Anemia, unspecified: Secondary | ICD-10-CM | POA: Diagnosis not present

## 2021-01-24 DIAGNOSIS — E875 Hyperkalemia: Secondary | ICD-10-CM | POA: Diagnosis not present

## 2021-01-24 NOTE — Telephone Encounter (Signed)
Patient is coming in today for a nurse visit to check labs.

## 2021-01-24 NOTE — Progress Notes (Signed)
Reports for lab results.  Patient had no questions at in-take

## 2021-01-25 ENCOUNTER — Other Ambulatory Visit: Payer: Self-pay

## 2021-01-25 ENCOUNTER — Other Ambulatory Visit: Payer: Self-pay | Admitting: Adult Health

## 2021-01-25 DIAGNOSIS — E039 Hypothyroidism, unspecified: Secondary | ICD-10-CM

## 2021-01-25 LAB — CBC WITH DIFFERENTIAL/PLATELET
Absolute Monocytes: 494 cells/uL (ref 200–950)
Basophils Absolute: 31 cells/uL (ref 0–200)
Basophils Relative: 0.6 %
Eosinophils Absolute: 42 cells/uL (ref 15–500)
Eosinophils Relative: 0.8 %
HCT: 30.7 % — ABNORMAL LOW (ref 35.0–45.0)
Hemoglobin: 10 g/dL — ABNORMAL LOW (ref 11.7–15.5)
Lymphs Abs: 1300 cells/uL (ref 850–3900)
MCH: 30.2 pg (ref 27.0–33.0)
MCHC: 32.6 g/dL (ref 32.0–36.0)
MCV: 92.7 fL (ref 80.0–100.0)
MPV: 9.3 fL (ref 7.5–12.5)
Monocytes Relative: 9.5 %
Neutro Abs: 3333 cells/uL (ref 1500–7800)
Neutrophils Relative %: 64.1 %
Platelets: 249 10*3/uL (ref 140–400)
RBC: 3.31 10*6/uL — ABNORMAL LOW (ref 3.80–5.10)
RDW: 13.5 % (ref 11.0–15.0)
Total Lymphocyte: 25 %
WBC: 5.2 10*3/uL (ref 3.8–10.8)

## 2021-01-25 LAB — BASIC METABOLIC PANEL
BUN/Creatinine Ratio: 23 (calc) — ABNORMAL HIGH (ref 6–22)
BUN: 22 mg/dL (ref 7–25)
CO2: 26 mmol/L (ref 20–32)
Calcium: 9 mg/dL (ref 8.6–10.4)
Chloride: 104 mmol/L (ref 98–110)
Creat: 0.97 mg/dL — ABNORMAL HIGH (ref 0.60–0.88)
Glucose, Bld: 93 mg/dL (ref 65–139)
Potassium: 4.2 mmol/L (ref 3.5–5.3)
Sodium: 138 mmol/L (ref 135–146)

## 2021-01-25 LAB — TSH: TSH: 2.24 mIU/L (ref 0.40–4.50)

## 2021-01-25 MED ORDER — AMITRIPTYLINE HCL 50 MG PO TABS: 25.0000 mg | ORAL_TABLET | Freq: Every day | ORAL | 1 refills | Status: DC

## 2021-01-25 MED ORDER — LEVOTHYROXINE SODIUM 112 MCG PO TABS: 112.0000 ug | ORAL_TABLET | Freq: Every day | ORAL | 3 refills | Status: DC

## 2021-02-11 ENCOUNTER — Ambulatory Visit: Payer: Medicare Other

## 2021-02-14 ENCOUNTER — Emergency Department (HOSPITAL_BASED_OUTPATIENT_CLINIC_OR_DEPARTMENT_OTHER)
Admission: EM | Admit: 2021-02-14 | Discharge: 2021-02-14 | Disposition: A | Discharge status: Home / Self Care | Payer: Medicare Other | Attending: Emergency Medicine | Admitting: Emergency Medicine

## 2021-02-14 ENCOUNTER — Emergency Department (HOSPITAL_BASED_OUTPATIENT_CLINIC_OR_DEPARTMENT_OTHER): Payer: Medicare Other | Admitting: Radiology

## 2021-02-14 ENCOUNTER — Other Ambulatory Visit: Payer: Self-pay

## 2021-02-14 ENCOUNTER — Other Ambulatory Visit: Payer: Self-pay | Admitting: Internal Medicine

## 2021-02-14 ENCOUNTER — Encounter (HOSPITAL_BASED_OUTPATIENT_CLINIC_OR_DEPARTMENT_OTHER): Payer: Self-pay | Admitting: Emergency Medicine

## 2021-02-14 ENCOUNTER — Ambulatory Visit (INDEPENDENT_AMBULATORY_CARE_PROVIDER_SITE_OTHER): Payer: Medicare Other

## 2021-02-14 VITALS — BP 226/72 | HR 85 | Temp 97.7°F | Wt 97.4 lb

## 2021-02-14 DIAGNOSIS — E039 Hypothyroidism, unspecified: Secondary | ICD-10-CM | POA: Insufficient documentation

## 2021-02-14 DIAGNOSIS — M546 Pain in thoracic spine: Secondary | ICD-10-CM | POA: Diagnosis not present

## 2021-02-14 DIAGNOSIS — Z79899 Other long term (current) drug therapy: Secondary | ICD-10-CM | POA: Diagnosis not present

## 2021-02-14 DIAGNOSIS — N183 Chronic kidney disease, stage 3 unspecified: Secondary | ICD-10-CM | POA: Insufficient documentation

## 2021-02-14 DIAGNOSIS — E538 Deficiency of other specified B group vitamins: Secondary | ICD-10-CM

## 2021-02-14 DIAGNOSIS — I129 Hypertensive chronic kidney disease with stage 1 through stage 4 chronic kidney disease, or unspecified chronic kidney disease: Secondary | ICD-10-CM | POA: Diagnosis not present

## 2021-02-14 DIAGNOSIS — R079 Chest pain, unspecified: Secondary | ICD-10-CM | POA: Diagnosis not present

## 2021-02-14 DIAGNOSIS — J449 Chronic obstructive pulmonary disease, unspecified: Secondary | ICD-10-CM | POA: Diagnosis not present

## 2021-02-14 LAB — CBC WITH DIFFERENTIAL/PLATELET
Abs Immature Granulocytes: 0.01 10*3/uL (ref 0.00–0.07)
Basophils Absolute: 0 10*3/uL (ref 0.0–0.1)
Basophils Relative: 1 %
Eosinophils Absolute: 0 10*3/uL (ref 0.0–0.5)
Eosinophils Relative: 1 %
HCT: 33.8 % — ABNORMAL LOW (ref 36.0–46.0)
Hemoglobin: 10.9 g/dL — ABNORMAL LOW (ref 12.0–15.0)
Immature Granulocytes: 0 %
Lymphocytes Relative: 24 %
Lymphs Abs: 1.5 10*3/uL (ref 0.7–4.0)
MCH: 29.9 pg (ref 26.0–34.0)
MCHC: 32.2 g/dL (ref 30.0–36.0)
MCV: 92.9 fL (ref 80.0–100.0)
Monocytes Absolute: 0.6 10*3/uL (ref 0.1–1.0)
Monocytes Relative: 10 %
Neutro Abs: 4 10*3/uL (ref 1.7–7.7)
Neutrophils Relative %: 64 %
Platelets: 233 10*3/uL (ref 150–400)
RBC: 3.64 MIL/uL — ABNORMAL LOW (ref 3.87–5.11)
RDW: 14.2 % (ref 11.5–15.5)
WBC: 6.2 10*3/uL (ref 4.0–10.5)
nRBC: 0 % (ref 0.0–0.2)

## 2021-02-14 LAB — BASIC METABOLIC PANEL
Anion gap: 9 (ref 5–15)
BUN: 19 mg/dL (ref 8–23)
CO2: 25 mmol/L (ref 22–32)
Calcium: 9.7 mg/dL (ref 8.9–10.3)
Chloride: 102 mmol/L (ref 98–111)
Creatinine, Ser: 1 mg/dL (ref 0.44–1.00)
GFR, Estimated: 53 mL/min — ABNORMAL LOW (ref >60)
Glucose, Bld: 108 mg/dL — ABNORMAL HIGH (ref 70–99)
Potassium: 3.9 mmol/L (ref 3.5–5.1)
Sodium: 136 mmol/L (ref 135–145)

## 2021-02-14 MED ORDER — LOSARTAN POTASSIUM 25 MG PO TABS
50.0000 mg | ORAL_TABLET | Freq: Once | ORAL | Status: AC
  Administered 2021-02-14: 50 mg via ORAL
  Filled 2021-02-14: qty 2

## 2021-02-14 MED ORDER — CYANOCOBALAMIN 1000 MCG/ML IJ SOLN
1000.0000 ug | Freq: Once | INTRAMUSCULAR | Status: AC
  Administered 2021-02-14: 1000 ug via INTRAMUSCULAR

## 2021-02-14 MED ORDER — DICLOFENAC SODIUM 1 % EX GEL: 4.0000 g | Freq: Four times a day (QID) | CUTANEOUS | 0 refills | Status: DC

## 2021-02-14 MED ORDER — ACETAMINOPHEN 500 MG PO TABS
500.0000 mg | ORAL_TABLET | Freq: Once | ORAL | Status: AC
  Administered 2021-02-14: 500 mg via ORAL
  Filled 2021-02-14: qty 1

## 2021-02-14 NOTE — Discharge Instructions (Addendum)
Return for worsening pain, fever or shortness of breath.  Take tylenol 1000mg (2 extra strength) four times a day. Use the gel as prescribed

## 2021-02-14 NOTE — Progress Notes (Addendum)
Patient presents to the office for a B12 injection that was given in the left deltoid. Patient tolerated well. Vitals checked, Blood Pressure reading was 226/72, 242/100, and 220/70. Provider aware and instructions given along with ER precautions.
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
## 2021-02-14 NOTE — ED Notes (Signed)
RT obtained vitals on pt. Pts RN was notified of critical BP 221/86 at this time. Pt respiratory status is stable at this time w/no distress noted. RT will continue to monitor.

## 2021-03-05 ENCOUNTER — Other Ambulatory Visit: Payer: Self-pay | Admitting: Internal Medicine

## 2021-03-18 ENCOUNTER — Ambulatory Visit: Payer: Medicare Other

## 2021-03-21 ENCOUNTER — Other Ambulatory Visit: Payer: Self-pay | Admitting: Adult Health

## 2021-04-08 ENCOUNTER — Telehealth: Payer: Self-pay | Admitting: Internal Medicine

## 2021-04-08 NOTE — Progress Notes (Signed)
  Chronic Care Management   Note  04/08/2021 Name: SHANDRIA CLINCH MRN: [id number] DOB: [birth year]  VOLLIE AARON is a 87 y.o. year old female who is a primary care patient of Lucky Cowboy, MD. I reached out to Lyman Bishop by phone today in response to a referral sent by Ms. Daisy Blossom Belcourt's PCP, Lucky Cowboy, MD.   Ms. Kissoon was given information about Chronic Care Management services today including:  1. CCM service includes personalized support from designated clinical staff supervised by her physician, including individualized plan of care and coordination with other care providers 2. 24/7 contact phone numbers for assistance for urgent and routine care needs. 3. Service will only be billed when office clinical staff spend 20 minutes or more in a month to coordinate care. 4. Only one practitioner may furnish and bill the service in a calendar month. 5. The patient may stop CCM services at any time (effective at the end of the month) by phone call to the office staff.   Patient wishes to consider information provided and/or speak with a member of the care team before deciding about enrollment in care management services.   Follow up plan:   [employer] manager, fast food

## 2021-04-17 ENCOUNTER — Ambulatory Visit: Payer: Medicare Other | Admitting: Adult Health

## 2021-04-22 ENCOUNTER — Other Ambulatory Visit: Payer: Self-pay

## 2021-04-22 ENCOUNTER — Ambulatory Visit (INDEPENDENT_AMBULATORY_CARE_PROVIDER_SITE_OTHER): Payer: Medicare Other | Admitting: Adult Health

## 2021-04-22 ENCOUNTER — Encounter: Payer: Self-pay | Admitting: Adult Health

## 2021-04-22 VITALS — BP 148/70 | HR 89 | Temp 97.5°F | Wt 95.0 lb

## 2021-04-22 DIAGNOSIS — R0989 Other specified symptoms and signs involving the circulatory and respiratory systems: Secondary | ICD-10-CM

## 2021-04-22 DIAGNOSIS — E559 Vitamin D deficiency, unspecified: Secondary | ICD-10-CM

## 2021-04-22 DIAGNOSIS — Z79899 Other long term (current) drug therapy: Secondary | ICD-10-CM | POA: Diagnosis not present

## 2021-04-22 DIAGNOSIS — Z681 Body mass index (BMI) 19 or less, adult: Secondary | ICD-10-CM

## 2021-04-22 DIAGNOSIS — D649 Anemia, unspecified: Secondary | ICD-10-CM

## 2021-04-22 DIAGNOSIS — E441 Mild protein-calorie malnutrition: Secondary | ICD-10-CM

## 2021-04-22 DIAGNOSIS — E611 Iron deficiency: Secondary | ICD-10-CM

## 2021-04-22 DIAGNOSIS — R7989 Other specified abnormal findings of blood chemistry: Secondary | ICD-10-CM | POA: Diagnosis not present

## 2021-04-22 DIAGNOSIS — E039 Hypothyroidism, unspecified: Secondary | ICD-10-CM | POA: Diagnosis not present

## 2021-04-22 DIAGNOSIS — R7309 Other abnormal glucose: Secondary | ICD-10-CM | POA: Diagnosis not present

## 2021-04-22 DIAGNOSIS — N1831 Chronic kidney disease, stage 3a: Secondary | ICD-10-CM

## 2021-04-22 DIAGNOSIS — E538 Deficiency of other specified B group vitamins: Secondary | ICD-10-CM

## 2021-04-22 MED ORDER — CYANOCOBALAMIN 1000 MCG/ML IJ SOLN
1000.0000 ug | Freq: Once | INTRAMUSCULAR | Status: AC
  Administered 2021-04-22: 1000 ug via INTRAMUSCULAR

## 2021-04-22 NOTE — Addendum Note (Signed)
Addended by: Dionicio Stall on: 04/22/2021 04:30 PM   Modules accepted: Orders

## 2021-04-22 NOTE — Progress Notes (Signed)
Assessment and Plan:  Aundraya was seen today for follow-up.  Diagnoses and all orders for this visit:  Hypertension, labile Doing well with losartan 50 mg daily  Monitor blood pressure at home; call if consistently over 150/90 Stop/hold med if any BPs <110/70 or dizziness or fatigue Continue DASH diet.   Reminder to go to the ER if any CP, SOB, nausea, dizziness, severe HA, changes vision/speech, left arm numbness and tingling and jaw pain. Follow up 1 month for recheck and BMP/GFR or sooner if concerns Daughter will assist with close monitoring -     losartan (COZAAR) 25 MG tablet; Take 1-2 tablet (25 mg total) by mouth daily for BP goal <150/90  Iron deficiency Has been on iron supplement 65 mg slow release daily and tolerating well  Recheck CBC and iron Hematology if trending down any further or poor response with oral iron supplement   Anemia, unspecified type Chronic, predates 2011; improved on iron Recheck hemoccult if any drop  CKD III/microalbuminuria (HCC) CKD may be contributing to anemia; recheck labs today Continue losartan/ARB and tighter BP control if possible  B12 def  Injection given today  Further disposition pending results of labs. Discussed med's effects and SE's.   Over 30 minutes of exam, counseling, chart review, and critical decision making was performed.   Future Appointments  Date Time Provider Department Center  09/17/2021 10:30 AM Judd Gaudier, NP [facility] None  11/28/2021  2:00 PM Judd Gaudier, NP [facility] None    ------------------------------------------------------------------------------------------------------------------   HPI BP (!) 148/70   Pulse 89   Temp (!) 97.5 F (36.4 C)   Wt 95 lb (43.1 kg)   SpO2 98%   BMI 17.10 kg/m   87 y.o.female presents for follow up on labile hypertension, also persistent iron deficiency anemia and due for B12 injection.   She has been taking losartan 50 mg daily with home log  demonstrating 130/70-160/90 range.   Today their BP is BP: (!) 148/70, similar by recheck  She denies chest pain, shortness of breath, dizziness.  She has chronic/persistent normocytic anemia for many years, She has been on iron supplement since, taking 65 mg slow release with orange juice and tolerating well. Her last colonoscopy was in 2010, appears was benign other than external hemorrhoids without further follow up recommended due to benign history and age. She had recent normal DRE/hemoccult.  CBC Latest Ref Rng & Units 02/14/2021 01/24/2021 01/08/2021  WBC 4.0 - 10.5 K/uL 6.2 5.2 8.0  Hemoglobin 12.0 - 15.0 g/dL 10.9(L) 10.0(L) 10.1(L)  Hematocrit 36.0 - 46.0 % 33.8(L) 30.7(L) 30.2(L)  Platelets 150 - 400 K/uL 233 249 197   Lab Results  Component Value Date   IRON 11 (L) 01/08/2021   TIBC 237 (L) 01/08/2021   FERRITIN 148 07/16/2020   She is on thyroid medication. Her medication was not changed last visit.  She is taking 112 mcg tabs, 1 daily, 30-60 min prior to other meds.  Lab Results  Component Value Date   TSH 2.24 01/24/2021   Patient is on Vitamin D supplement.   Lab Results  Component Value Date   VD25OH 88 01/09/2020     She did have B12 injection today;  Lab Results  Component Value Date   VITAMINB12 >2,000 (H) 03/06/2020     Past Medical History:  Diagnosis Date   Acute cholecystitis 01/18/2019   Acute kidney injury (nontraumatic) (HCC)    Allergy    Anemia    Anxiety  DDD (degenerative disc disease), lumbar    Depression    GERD (gastroesophageal reflux disease)    Hyperlipidemia    Hypertension    OA (osteoarthritis) of knee    right knee   Pre-diabetes    Thyroid disease    Vitamin D deficiency      Allergies  Allergen Reactions   Norvasc [Amlodipine Besylate]    Sulfa Antibiotics     Current Outpatient Medications on File Prior to Visit  Medication Sig   ALPRAZolam (XANAX) 0.25 MG tablet TAKE 1/2 TO 1 TABLET AT BEDTIME ONLY IF NEEDED FOR  SLEEP & PLEASE TRY TO LIMIT TO 5 DAYS /WEEK TO AVOID ADDICTION & DEMENTIA (Patient taking differently: Take 1/2 to 1 tablet    at Bedtime   ONLY       if needed for Sleep & please try to limit to 5 days /week to avoid Addiction & Dementia)   amitriptyline (ELAVIL) 50 MG tablet Take 0.5 tablets (25 mg total) by mouth at bedtime.   Cholecalciferol (VITAMIN D-3) 5000 UNITS TABS Take 5,000 Units by mouth daily.   cyanocobalamin (,VITAMIN B-12,) 1000 MCG/ML injection INJECT 1 MILLILITER INTO THE SKIN EVERY 30 DAYS   ferrous sulfate 325 (65 FE) MG tablet Take 325 mg by mouth daily. Takes 143mg  BID   levothyroxine (SYNTHROID) 112 MCG tablet Take 1 tablet (112 mcg total) by mouth daily before breakfast.   losartan (COZAAR) 25 MG tablet TAKE 1 OR 2 TABLETS BY MOUTH DAILY. TAKES VARIABLE DOSE DUE TO LABILE VALUES TO KEEP <150/90.   omeprazole (PRILOSEC) 40 MG capsule TAKE 1 CAPSULE DAILY TO PREVENT INDIGESTION & HEARTBURNT   Probiotic Product (RESTORA) CAPS TAKE 1 TABLET DAILY FOR INTESTINAL HEALTHT   diclofenac Sodium (VOLTAREN) 1 % GEL Apply 4 g topically 4 (four) times daily. (Patient not taking: Reported on 04/22/2021)   No current facility-administered medications on file prior to visit.    ROS: all negative except above.   Physical Exam:  BP (!) 148/70   Pulse 89   Temp (!) 97.5 F (36.4 C)   Wt 95 lb (43.1 kg)   SpO2 98%   BMI 17.10 kg/m   General Appearance: Thin/frail elder female, well dressed, in no apparent distress. Eyes: PERRLA, conjunctiva no swelling or erythema ENT/Mouth: Ext aud canals clear, TMs without erythema, bulging. No erythema, swelling, or exudate on post pharynx.  Tonsils not swollen or erythematous. Hearing normal.  Neck: Supple, thyroid normal.  Respiratory: Respiratory effort normal, BS equal bilaterally without rales, rhonchi, wheezing or stridor.  Cardio: RRR with no MRGs. Brisk peripheral pulses without edema.   Abdomen: Soft, + BS.  Non tender, no guarding,  rebound, hernias, masses. Lymphatics: Non tender without lymphadenopathy.  Musculoskeletal: Mild/mod kyphosis; slow steady gait with cane;  Skin: Warm, dry without rashes, lesions, ecchymosis.  Neuro: Cranial nerves intact. Normal muscle tone, no cerebellar symptoms. Sensation intact.  Psych: Awake and oriented X 3, normal affect, Insight and Judgment appropriate.     04/24/2021, NP 3:49 PM [facility]

## 2021-04-23 ENCOUNTER — Other Ambulatory Visit: Payer: Self-pay

## 2021-04-23 ENCOUNTER — Encounter: Payer: Self-pay | Admitting: Adult Health

## 2021-04-23 ENCOUNTER — Other Ambulatory Visit: Payer: Self-pay | Admitting: Adult Health

## 2021-04-23 DIAGNOSIS — R7989 Other specified abnormal findings of blood chemistry: Secondary | ICD-10-CM | POA: Insufficient documentation

## 2021-04-23 DIAGNOSIS — D649 Anemia, unspecified: Secondary | ICD-10-CM

## 2021-04-23 DIAGNOSIS — E875 Hyperkalemia: Secondary | ICD-10-CM

## 2021-04-24 ENCOUNTER — Ambulatory Visit (HOSPITAL_BASED_OUTPATIENT_CLINIC_OR_DEPARTMENT_OTHER)
Admission: RE | Admit: 2021-04-24 | Discharge: 2021-04-24 | Disposition: A | Discharge status: Home / Self Care | Payer: Medicare Other | Source: Ambulatory Visit | Attending: Adult Health | Admitting: Adult Health

## 2021-04-24 ENCOUNTER — Other Ambulatory Visit: Payer: Self-pay | Admitting: Adult Health

## 2021-04-24 ENCOUNTER — Other Ambulatory Visit: Payer: Self-pay

## 2021-04-24 DIAGNOSIS — R7989 Other specified abnormal findings of blood chemistry: Secondary | ICD-10-CM

## 2021-04-24 DIAGNOSIS — R945 Abnormal results of liver function studies: Secondary | ICD-10-CM | POA: Diagnosis not present

## 2021-04-24 DIAGNOSIS — E039 Hypothyroidism, unspecified: Secondary | ICD-10-CM

## 2021-04-24 LAB — COMPLETE METABOLIC PANEL WITH GFR
AG Ratio: 1.3 (calc) (ref 1.0–2.5)
ALT: 119 U/L — ABNORMAL HIGH (ref 6–29)
AST: 125 U/L — ABNORMAL HIGH (ref 10–35)
Albumin: 3.8 g/dL (ref 3.6–5.1)
Alkaline phosphatase (APISO): 168 U/L — ABNORMAL HIGH (ref 37–153)
BUN/Creatinine Ratio: 25 (calc) — ABNORMAL HIGH (ref 6–22)
BUN: 29 mg/dL — ABNORMAL HIGH (ref 7–25)
CO2: 25 mmol/L (ref 20–32)
Calcium: 9.2 mg/dL (ref 8.6–10.4)
Chloride: 105 mmol/L (ref 98–110)
Creat: 1.14 mg/dL — ABNORMAL HIGH (ref 0.60–0.88)
GFR, Est African American: 49 mL/min/{1.73_m2} — ABNORMAL LOW (ref >=60)
GFR, Est Non African American: 42 mL/min/{1.73_m2} — ABNORMAL LOW (ref >=60)
Globulin: 2.9 g/dL (calc) (ref 1.9–3.7)
Glucose, Bld: 103 mg/dL — ABNORMAL HIGH (ref 65–99)
Potassium: 5.5 mmol/L — ABNORMAL HIGH (ref 3.5–5.3)
Sodium: 136 mmol/L (ref 135–146)
Total Bilirubin: 0.4 mg/dL (ref 0.2–1.2)
Total Protein: 6.7 g/dL (ref 6.1–8.1)

## 2021-04-24 LAB — GAMMA GT: GGT: 104 U/L — ABNORMAL HIGH (ref 3–65)

## 2021-04-24 LAB — CBC WITH DIFFERENTIAL/PLATELET
Absolute Monocytes: 410 cells/uL (ref 200–950)
Basophils Absolute: 32 cells/uL (ref 0–200)
Basophils Relative: 0.6 %
Eosinophils Absolute: 49 cells/uL (ref 15–500)
Eosinophils Relative: 0.9 %
HCT: 29.8 % — ABNORMAL LOW (ref 35.0–45.0)
Hemoglobin: 9.7 g/dL — ABNORMAL LOW (ref 11.7–15.5)
Lymphs Abs: 977 cells/uL (ref 850–3900)
MCH: 29.9 pg (ref 27.0–33.0)
MCHC: 32.6 g/dL (ref 32.0–36.0)
MCV: 92 fL (ref 80.0–100.0)
MPV: 9.3 fL (ref 7.5–12.5)
Monocytes Relative: 7.6 %
Neutro Abs: 3931 cells/uL (ref 1500–7800)
Neutrophils Relative %: 72.8 %
Platelets: 218 10*3/uL (ref 140–400)
RBC: 3.24 10*6/uL — ABNORMAL LOW (ref 3.80–5.10)
RDW: 13.6 % (ref 11.0–15.0)
Total Lymphocyte: 18.1 %
WBC: 5.4 10*3/uL (ref 3.8–10.8)

## 2021-04-24 LAB — TEST AUTHORIZATION

## 2021-04-24 LAB — MAGNESIUM: Magnesium: 1.8 mg/dL (ref 1.5–2.5)

## 2021-04-24 LAB — IRON,TIBC AND FERRITIN PANEL
%SAT: 29 % (calc) (ref 16–45)
Ferritin: 167 ng/mL (ref 16–288)
Iron: 61 ug/dL (ref 45–160)
TIBC: 213 mcg/dL (calc) — ABNORMAL LOW (ref 250–450)

## 2021-04-24 LAB — TSH: TSH: 3.3 mIU/L (ref 0.40–4.50)

## 2021-04-24 MED ORDER — LEVOTHYROXINE SODIUM 112 MCG PO TABS
112.0000 ug | ORAL_TABLET | Freq: Every day | ORAL | 3 refills | Status: AC
Start: 2021-04-24 — End: ?

## 2021-04-26 ENCOUNTER — Other Ambulatory Visit: Payer: BLUE CROSS/BLUE SHIELD

## 2021-04-29 ENCOUNTER — Ambulatory Visit (INDEPENDENT_AMBULATORY_CARE_PROVIDER_SITE_OTHER): Payer: Medicare Other

## 2021-04-29 ENCOUNTER — Other Ambulatory Visit: Payer: Self-pay

## 2021-04-29 DIAGNOSIS — R7989 Other specified abnormal findings of blood chemistry: Secondary | ICD-10-CM | POA: Diagnosis not present

## 2021-04-29 DIAGNOSIS — E875 Hyperkalemia: Secondary | ICD-10-CM | POA: Diagnosis not present

## 2021-04-29 LAB — COMPLETE METABOLIC PANEL WITH GFR
AG Ratio: 1.3 (calc) (ref 1.0–2.5)
ALT: 27 U/L (ref 6–29)
AST: 29 U/L (ref 10–35)
Albumin: 3.6 g/dL (ref 3.6–5.1)
Alkaline phosphatase (APISO): 129 U/L (ref 37–153)
BUN/Creatinine Ratio: 24 (calc) — ABNORMAL HIGH (ref 6–22)
BUN: 24 mg/dL (ref 7–25)
CO2: 26 mmol/L (ref 20–32)
Calcium: 9.1 mg/dL (ref 8.6–10.4)
Chloride: 102 mmol/L (ref 98–110)
Creat: 1 mg/dL — ABNORMAL HIGH (ref 0.60–0.88)
GFR, Est African American: 57 mL/min/{1.73_m2} — ABNORMAL LOW (ref >=60)
GFR, Est Non African American: 49 mL/min/{1.73_m2} — ABNORMAL LOW (ref >=60)
Globulin: 2.7 g/dL (calc) (ref 1.9–3.7)
Glucose, Bld: 79 mg/dL (ref 65–99)
Potassium: 4.6 mmol/L (ref 3.5–5.3)
Sodium: 135 mmol/L (ref 135–146)
Total Bilirubin: 0.4 mg/dL (ref 0.2–1.2)
Total Protein: 6.3 g/dL (ref 6.1–8.1)

## 2021-04-29 NOTE — Progress Notes (Signed)
Patient presents to the office for a nurse visit to have labs done. Patient's daughter concerned about her mother's Potassium levels. Orders put in for elevated LFT's and Hyperkalemia. No other questions or concerns at this time. Vitals taken and recorded.

## 2021-04-30 ENCOUNTER — Telehealth: Payer: Self-pay | Admitting: Internal Medicine

## 2021-04-30 NOTE — Progress Notes (Signed)
  Chronic Care Management   Outreach Note  04/30/2021 Name: Shelby Bradley MRN: [id number] DOB: June 20, 1928  Referred by: Lucky Cowboy, MD Reason for referral : No chief complaint on file.   A second unsuccessful telephone outreach was attempted today. The patient was referred to pharmacist for assistance with care management and care coordination.  Follow Up Plan:   Tatjana Dellinger Upstream Scheduler

## 2021-05-06 DIAGNOSIS — Z20822 Contact with and (suspected) exposure to covid-19: Secondary | ICD-10-CM | POA: Diagnosis not present

## 2021-05-07 ENCOUNTER — Telehealth: Payer: Self-pay | Admitting: Internal Medicine

## 2021-05-07 NOTE — Chronic Care Management (AMB) (Signed)
  Chronic Care Management   Outreach Note  05/07/2021 Name: DALPHINE COWIE MRN: [id number] DOB: [birth year]  Referred by: Lucky Cowboy, MD Reason for referral : No chief complaint on file.   Third unsuccessful telephone outreach was attempted today. The patient was referred to the pharmacist for assistance with care management and care coordination.   Follow Up Plan:   Tatjana Dellinger Upstream Scheduler

## 2021-05-21 ENCOUNTER — Ambulatory Visit: Payer: Medicare Other

## 2021-05-21 IMAGING — DX DG CHEST 2V
2 series · 2 of 2 positions shown · non-contrast
Comparison: 08/13/2020 and earlier.

CLINICAL DATA: [AGE] with acute RIGHT upper back pain/RIGHT
posterior chest pain.

EXAM:
CHEST - 2 VIEW

[chest pa]
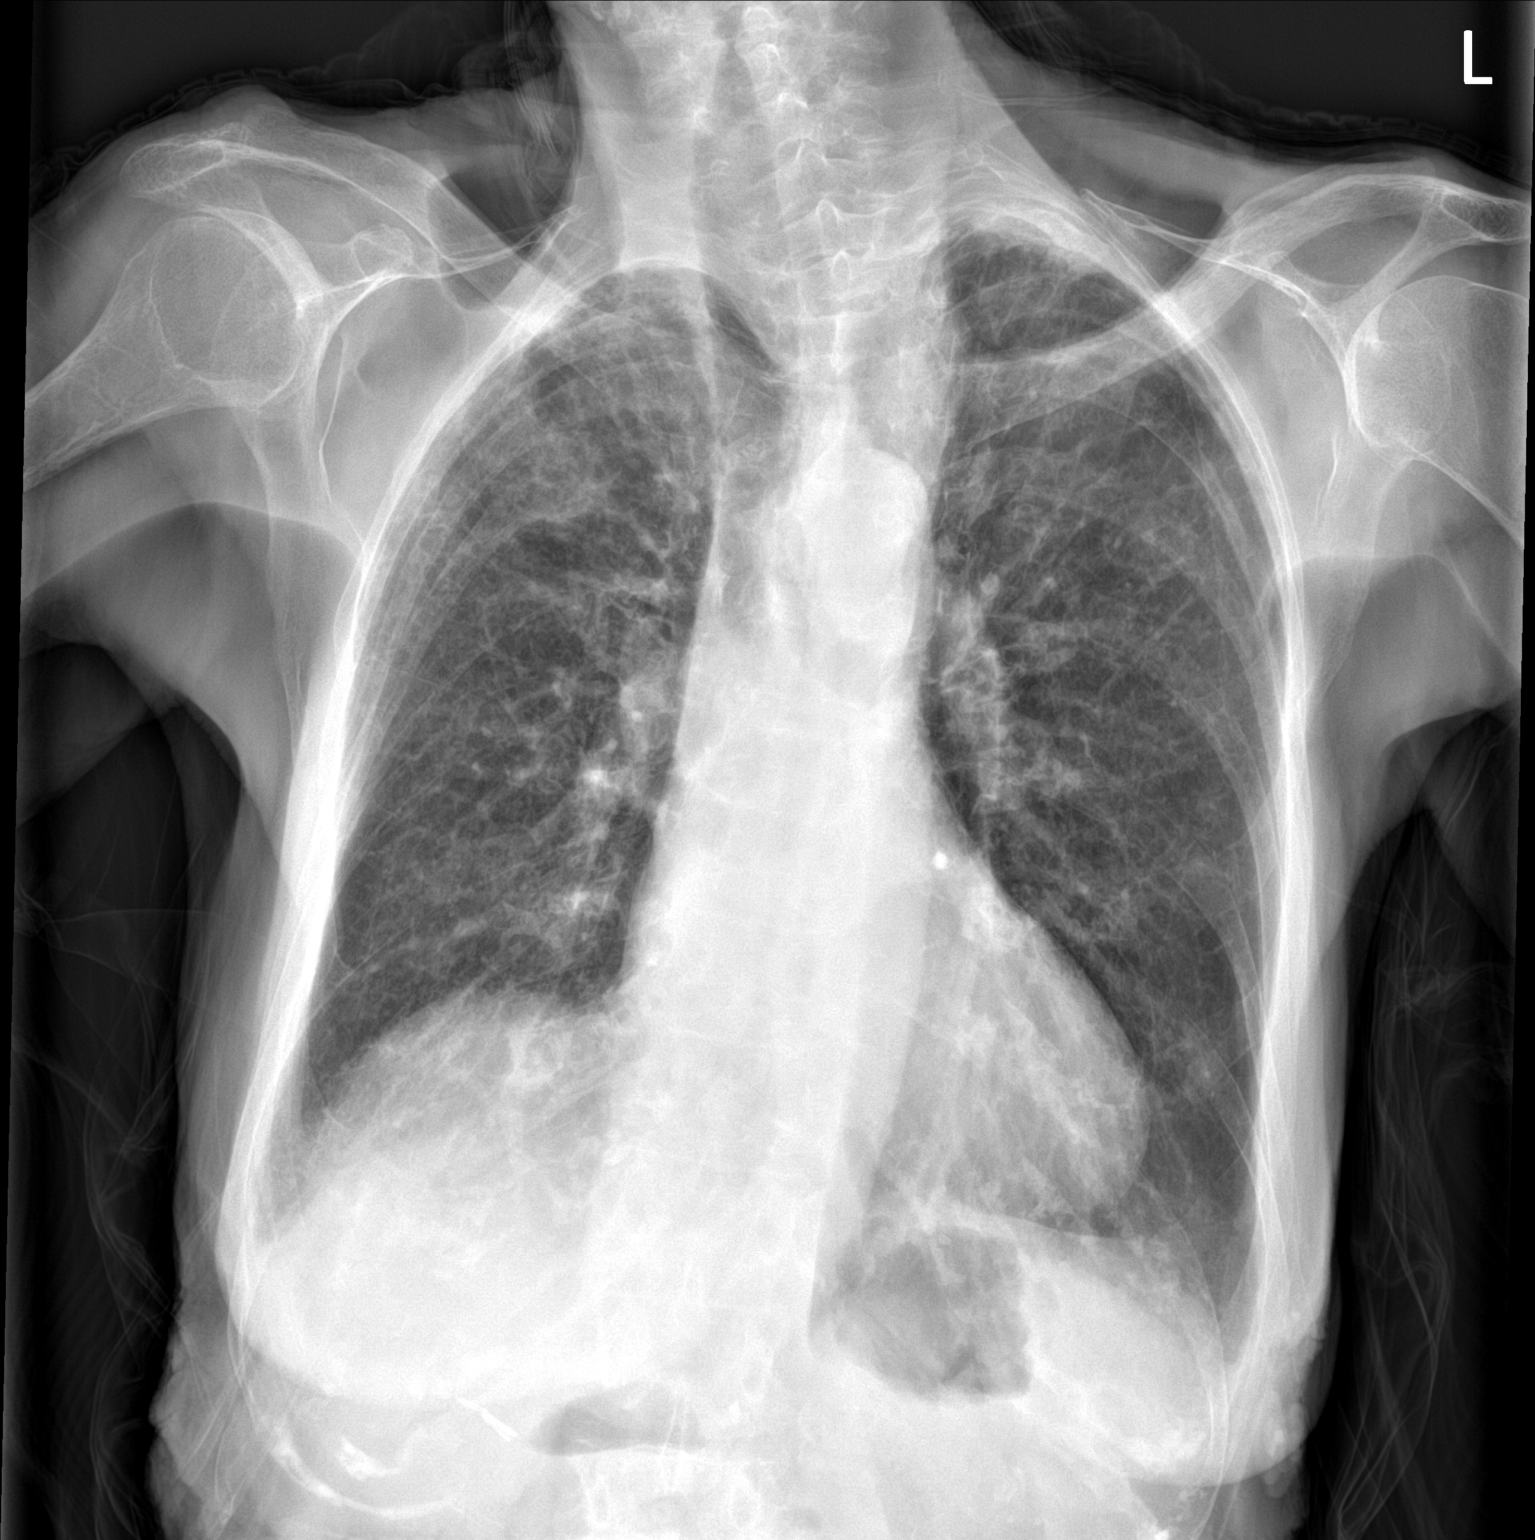

[chest lat]
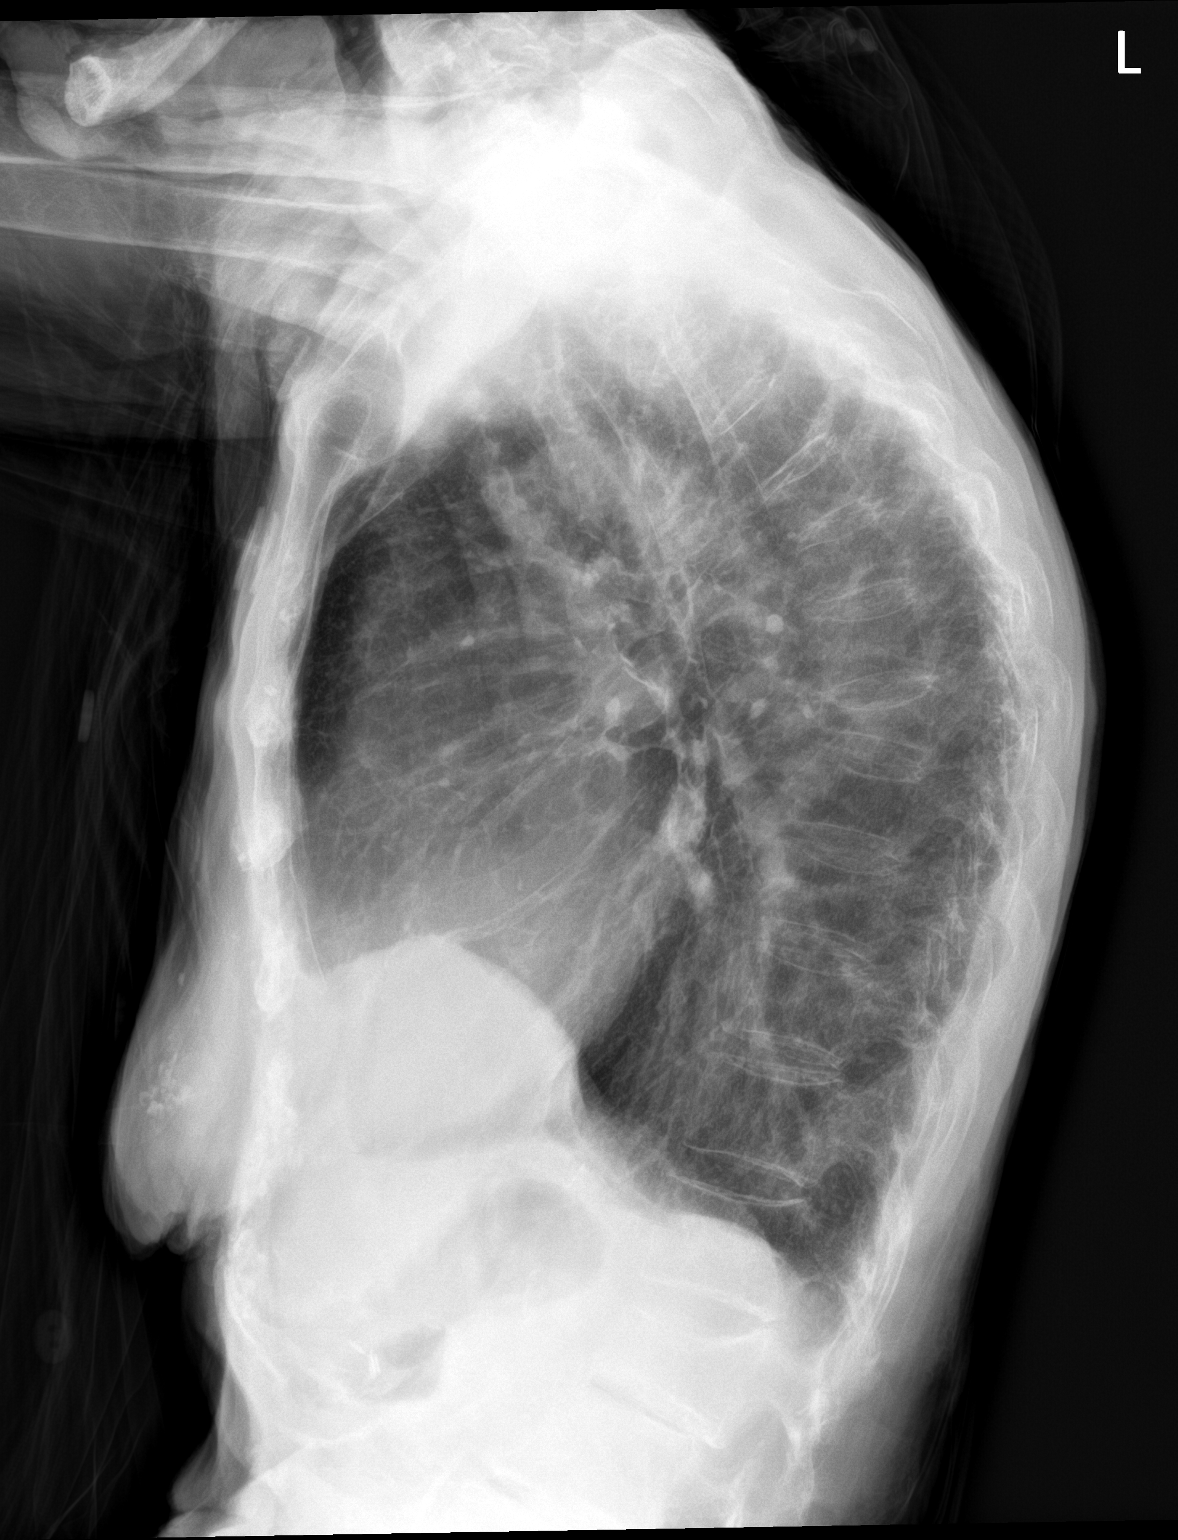

[2 of 2 positions shown; findings below may reference images not displayed]

FINDINGS: Cardiac silhouette normal in size, unchanged. Thoracic aorta mildly
atherosclerotic, unchanged. Hilar and mediastinal contours otherwise
unremarkable. Marked hyperinflation and prominent bronchovascular
markings diffusely with moderate central peribronchial thickening,
unchanged. No new pulmonary parenchymal abnormalities. No pleural
effusions. Osseous demineralization and multiple thoracic
compression fractures as noted previously.
IMPRESSION: Stable severe COPD/emphysema.  No acute cardiopulmonary disease.

## 2021-05-23 ENCOUNTER — Ambulatory Visit (INDEPENDENT_AMBULATORY_CARE_PROVIDER_SITE_OTHER): Payer: Medicare Other

## 2021-05-23 ENCOUNTER — Other Ambulatory Visit: Payer: Self-pay

## 2021-05-23 DIAGNOSIS — E538 Deficiency of other specified B group vitamins: Secondary | ICD-10-CM

## 2021-05-23 DIAGNOSIS — D649 Anemia, unspecified: Secondary | ICD-10-CM | POA: Diagnosis not present

## 2021-05-23 DIAGNOSIS — E876 Hypokalemia: Secondary | ICD-10-CM | POA: Diagnosis not present

## 2021-05-23 MED ORDER — CYANOCOBALAMIN 1000 MCG/ML IJ SOLN
1000.0000 ug | Freq: Once | INTRAMUSCULAR | Status: AC
  Administered 2021-05-23: 1000 ug via INTRAMUSCULAR

## 2021-05-23 NOTE — Progress Notes (Signed)
Patient presents to the office for a B12 injection that was given in the RIGHT deltoid. Patient tolerated well. Vitals checked.

## 2021-05-24 ENCOUNTER — Other Ambulatory Visit: Payer: Self-pay | Admitting: Adult Health

## 2021-05-24 DIAGNOSIS — E875 Hyperkalemia: Secondary | ICD-10-CM

## 2021-05-24 LAB — COMPLETE METABOLIC PANEL WITH GFR
AG Ratio: 1.5 (calc) (ref 1.0–2.5)
ALT: 10 U/L (ref 6–29)
AST: 20 U/L (ref 10–35)
Albumin: 3.9 g/dL (ref 3.6–5.1)
Alkaline phosphatase (APISO): 79 U/L (ref 37–153)
BUN/Creatinine Ratio: 22 (calc) (ref 6–22)
BUN: 25 mg/dL (ref 7–25)
CO2: 26 mmol/L (ref 20–32)
Calcium: 9.7 mg/dL (ref 8.6–10.4)
Chloride: 105 mmol/L (ref 98–110)
Creat: 1.13 mg/dL — ABNORMAL HIGH (ref 0.60–0.95)
Globulin: 2.6 g/dL (calc) (ref 1.9–3.7)
Glucose, Bld: 121 mg/dL — ABNORMAL HIGH (ref 65–99)
Potassium: 5.9 mmol/L — ABNORMAL HIGH (ref 3.5–5.3)
Sodium: 136 mmol/L (ref 135–146)
Total Bilirubin: 0.4 mg/dL (ref 0.2–1.2)
Total Protein: 6.5 g/dL (ref 6.1–8.1)
eGFR: 46 mL/min/{1.73_m2} — ABNORMAL LOW (ref >=60)

## 2021-05-24 LAB — CBC WITH DIFFERENTIAL/PLATELET
Absolute Monocytes: 592 cells/uL (ref 200–950)
Basophils Absolute: 19 cells/uL (ref 0–200)
Basophils Relative: 0.3 %
Eosinophils Absolute: 32 cells/uL (ref 15–500)
Eosinophils Relative: 0.5 %
HCT: 32.5 % — ABNORMAL LOW (ref 35.0–45.0)
Hemoglobin: 10.5 g/dL — ABNORMAL LOW (ref 11.7–15.5)
Lymphs Abs: 1235 cells/uL (ref 850–3900)
MCH: 30.3 pg (ref 27.0–33.0)
MCHC: 32.3 g/dL (ref 32.0–36.0)
MCV: 93.9 fL (ref 80.0–100.0)
MPV: 8.9 fL (ref 7.5–12.5)
Monocytes Relative: 9.4 %
Neutro Abs: 4423 cells/uL (ref 1500–7800)
Neutrophils Relative %: 70.2 %
Platelets: 210 10*3/uL (ref 140–400)
RBC: 3.46 10*6/uL — ABNORMAL LOW (ref 3.80–5.10)
RDW: 13.1 % (ref 11.0–15.0)
Total Lymphocyte: 19.6 %
WBC: 6.3 10*3/uL (ref 3.8–10.8)

## 2021-05-30 ENCOUNTER — Other Ambulatory Visit: Payer: Self-pay

## 2021-05-30 ENCOUNTER — Other Ambulatory Visit: Payer: Medicare Other

## 2021-05-30 DIAGNOSIS — E875 Hyperkalemia: Secondary | ICD-10-CM

## 2021-05-31 ENCOUNTER — Encounter (HOSPITAL_BASED_OUTPATIENT_CLINIC_OR_DEPARTMENT_OTHER): Payer: Self-pay | Admitting: Emergency Medicine

## 2021-05-31 ENCOUNTER — Other Ambulatory Visit: Payer: Self-pay | Admitting: Adult Health

## 2021-05-31 ENCOUNTER — Emergency Department (HOSPITAL_BASED_OUTPATIENT_CLINIC_OR_DEPARTMENT_OTHER)
Admission: EM | Admit: 2021-05-31 | Discharge: 2021-05-31 | Disposition: A | Discharge status: Home / Self Care | Payer: Medicare Other | Attending: Emergency Medicine | Admitting: Emergency Medicine

## 2021-05-31 ENCOUNTER — Encounter: Payer: Self-pay | Admitting: Adult Health

## 2021-05-31 DIAGNOSIS — N183 Chronic kidney disease, stage 3 unspecified: Secondary | ICD-10-CM | POA: Insufficient documentation

## 2021-05-31 DIAGNOSIS — I10 Essential (primary) hypertension: Secondary | ICD-10-CM | POA: Diagnosis not present

## 2021-05-31 DIAGNOSIS — I129 Hypertensive chronic kidney disease with stage 1 through stage 4 chronic kidney disease, or unspecified chronic kidney disease: Secondary | ICD-10-CM | POA: Insufficient documentation

## 2021-05-31 DIAGNOSIS — J449 Chronic obstructive pulmonary disease, unspecified: Secondary | ICD-10-CM | POA: Insufficient documentation

## 2021-05-31 DIAGNOSIS — E039 Hypothyroidism, unspecified: Secondary | ICD-10-CM | POA: Diagnosis not present

## 2021-05-31 DIAGNOSIS — Z79899 Other long term (current) drug therapy: Secondary | ICD-10-CM | POA: Diagnosis not present

## 2021-05-31 DIAGNOSIS — R799 Abnormal finding of blood chemistry, unspecified: Secondary | ICD-10-CM | POA: Diagnosis present

## 2021-05-31 LAB — CBC WITH DIFFERENTIAL/PLATELET
Abs Immature Granulocytes: 0.01 10*3/uL (ref 0.00–0.07)
Basophils Absolute: 0 10*3/uL (ref 0.0–0.1)
Basophils Relative: 1 %
Eosinophils Absolute: 0.1 10*3/uL (ref 0.0–0.5)
Eosinophils Relative: 1 %
HCT: 31.9 % — ABNORMAL LOW (ref 36.0–46.0)
Hemoglobin: 10.4 g/dL — ABNORMAL LOW (ref 12.0–15.0)
Immature Granulocytes: 0 %
Lymphocytes Relative: 26 %
Lymphs Abs: 1.1 10*3/uL (ref 0.7–4.0)
MCH: 30.1 pg (ref 26.0–34.0)
MCHC: 32.6 g/dL (ref 30.0–36.0)
MCV: 92.5 fL (ref 80.0–100.0)
Monocytes Absolute: 0.4 10*3/uL (ref 0.1–1.0)
Monocytes Relative: 10 %
Neutro Abs: 2.6 10*3/uL (ref 1.7–7.7)
Neutrophils Relative %: 62 %
Platelets: 180 10*3/uL (ref 150–400)
RBC: 3.45 MIL/uL — ABNORMAL LOW (ref 3.87–5.11)
RDW: 14.5 % (ref 11.5–15.5)
WBC: 4.3 10*3/uL (ref 4.0–10.5)
nRBC: 0 % (ref 0.0–0.2)

## 2021-05-31 LAB — BASIC METABOLIC PANEL WITH GFR
BUN: 18 mg/dL (ref 7–25)
CO2: 25 mmol/L (ref 20–32)
Calcium: 9.5 mg/dL (ref 8.6–10.4)
Chloride: 104 mmol/L (ref 98–110)
Creat: 0.95 mg/dL (ref 0.60–0.95)
Glucose, Bld: 89 mg/dL (ref 65–99)
Potassium: 6.4 mmol/L (ref 3.5–5.3)
Sodium: 135 mmol/L (ref 135–146)
eGFR: 57 mL/min/{1.73_m2} — ABNORMAL LOW (ref >=60)

## 2021-05-31 LAB — COMPREHENSIVE METABOLIC PANEL
ALT: 9 U/L (ref 0–44)
AST: 20 U/L (ref 15–41)
Albumin: 3.8 g/dL (ref 3.5–5.0)
Alkaline Phosphatase: 64 U/L (ref 38–126)
Anion gap: 7 (ref 5–15)
BUN: 19 mg/dL (ref 8–23)
CO2: 25 mmol/L (ref 22–32)
Calcium: 9 mg/dL (ref 8.9–10.3)
Chloride: 102 mmol/L (ref 98–111)
Creatinine, Ser: 0.93 mg/dL (ref 0.44–1.00)
GFR, Estimated: 58 mL/min — ABNORMAL LOW (ref >60)
Glucose, Bld: 97 mg/dL (ref 70–99)
Potassium: 4.2 mmol/L (ref 3.5–5.1)
Sodium: 134 mmol/L — ABNORMAL LOW (ref 135–145)
Total Bilirubin: 0.6 mg/dL (ref 0.3–1.2)
Total Protein: 6.5 g/dL (ref 6.5–8.1)

## 2021-05-31 MED ORDER — BISOPROLOL-HYDROCHLOROTHIAZIDE 5-6.25 MG PO TABS: ORAL_TABLET | ORAL | 1 refills | Status: DC

## 2021-05-31 NOTE — ED Triage Notes (Signed)
Pt reports today , MD called her this am and stated her potassium was 6.4 and needed to be evaluated.

## 2021-05-31 NOTE — ED Provider Notes (Signed)
[facility] EMERGENCY DEPT Provider Note   CSN: [id number] Arrival date & time: 05/31/21  4696     History Chief Complaint  Patient presents with   Labs Only    Abnormal potassium    Shelby Bradley is a 87 y.o. female.  MACHELE DEIHL has a history of chronic kidney disease.  1 week ago, she had a mildly elevated potassium value, and yesterday, she had follow-up blood work.  Her BUN was 18, creatinine was 0.95, and potassium was 6.4.  This morning, when the labs are resulted, she was directed to the emergency department.  Her primary care doctor also discontinued her ARB.  The patient denies any dietary changes.  She eats mainly a plant-based diet and does enjoy potatoes.  She eats liver every morning.  She does have some diarrhea ever since she had a cholecystectomy, but this has not increased.  She has had 1 other incidents of hyperkalemia which necessitated hospitalization.  At that time, she had muscle cramping, and she denies that today.  The history is provided by the patient and a relative (daughter).      Past Medical History:  Diagnosis Date   Acute cholecystitis 01/18/2019   Acute kidney injury (nontraumatic) (HCC)    Allergy    Anemia    Anxiety    DDD (degenerative disc disease), lumbar    Depression    GERD (gastroesophageal reflux disease)    Hyperlipidemia    Hypertension    OA (osteoarthritis) of knee    right knee   Pre-diabetes    Thyroid disease    Vitamin D deficiency     Patient Active Problem List   Diagnosis Date Noted   Elevated LFTs 04/23/2021   External hemorrhoids 12/03/2020   Iron deficiency 11/28/2020   Peripheral edema 09/17/2020   BMI less than 19,adult 09/14/2020   Hyperkalemia 08/14/2020   Stage 3 chronic kidney disease (HCC) 09/12/2019   Malnutrition of mild degree (HCC) 11/18/2016   COPD (chronic obstructive pulmonary disease) (HCC) 07/10/2016   Hypothyroidism 06/28/2015   Macular degeneration 06/28/2015    Lower back pain 06/28/2015   Medication management 12/05/2014   Labile hypertension    Hyperlipidemia, mixed    B12 deficiency    Normochromic normocytic anemia    Gastroesophageal reflux disease    Vitamin D deficiency    Abnormal glucose     Past Surgical History:  Procedure Laterality Date   CATARACT EXTRACTION Bilateral    CHOLECYSTECTOMY N/A 01/18/2019   Procedure: LAPAROSCOPIC CHOLECYSTECTOMY WITH INTRAOPERATIVE CHOLANGIOGRAM;  Surgeon: Berna Bue, MD;  Location: [facility] OR;  Service: General;  Laterality: N/A;   ORIF DISTAL FEMUR FRACTURE Left    Dr. Luiz Blare     OB History   No obstetric history on file.     Family History  Problem Relation Age of Onset   Hypertension Mother    COPD Father    Heart attack Brother    Heart attack Brother     Social History   Tobacco Use   Smoking status: Never   Smokeless tobacco: Never  Vaping Use   Vaping Use: Never used  Substance Use Topics   Alcohol use: No   Drug use: No    Home Medications Prior to Admission medications   Medication Sig Start Date End Date Taking? Authorizing Provider  ALPRAZolam (XANAX) 0.25 MG tablet TAKE 1/2 TO 1 TABLET AT BEDTIME ONLY IF NEEDED FOR SLEEP & PLEASE TRY TO LIMIT TO 5 DAYS /  WEEK TO AVOID ADDICTION & DEMENTIA Patient taking differently: Take 1/2 to 1 tablet    at Bedtime   ONLY       if needed for Sleep & please try to limit to 5 days /week to avoid Addiction & Dementia 07/29/20  Yes Lucky Cowboy, MD  amitriptyline (ELAVIL) 50 MG tablet Take 0.5 tablets (25 mg total) by mouth at bedtime. 01/25/21  Yes Judd Gaudier, NP  Cholecalciferol (VITAMIN D-3) 5000 UNITS TABS Take 5,000 Units by mouth daily.   Yes [provider]  cyanocobalamin (,VITAMIN B-12,) 1000 MCG/ML injection INJECT 1 MILLILITER INTO THE SKIN EVERY 30 DAYS 02/14/21  Yes Judd Gaudier, NP  ferrous sulfate 325 (65 FE) MG tablet Take 325 mg by mouth daily. Takes 143mg  BID   Yes [provider]   levothyroxine (SYNTHROID) 112 MCG tablet Take 1 tablet (112 mcg total) by mouth daily before breakfast. 04/24/21  Yes 04/26/21, NP  losartan (COZAAR) 25 MG tablet Take 50 mg by mouth at bedtime.   Yes [provider]  omeprazole (PRILOSEC) 40 MG capsule TAKE 1 CAPSULE DAILY TO PREVENT INDIGESTION & HEARTBURNT 08/11/20  Yes 10/11/20, NP  Probiotic Product (RESTORA) CAPS TAKE 1 TABLET DAILY FOR INTESTINAL HEALTHT 03/05/21  Yes 05/05/21, MD  bisoprolol-hydrochlorothiazide [facility]) 5-6.25 MG tablet Take 1 tablet daily for BP 05/31/21   06/02/21, NP  diclofenac Sodium (VOLTAREN) 1 % GEL Apply 4 g topically 4 (four) times daily. Patient not taking: Reported on 04/22/2021 02/14/21   02/16/21, DO    Allergies    Norvasc [amlodipine besylate] and Sulfa antibiotics  Review of Systems   Review of Systems  Constitutional:  Negative for chills and fever.  HENT:  Negative for ear pain and sore throat.   Eyes:  Negative for pain and visual disturbance.  Respiratory:  Negative for cough and shortness of breath.   Cardiovascular:  Negative for chest pain and palpitations.  Gastrointestinal:  Negative for abdominal pain and vomiting.  Genitourinary:  Negative for dysuria and hematuria.  Musculoskeletal:  Negative for arthralgias and back pain.  Skin:  Negative for color change and rash.  Neurological:  Negative for seizures and syncope.  All other systems reviewed and are negative.  Physical Exam Updated Vital Signs BP (!) 201/95   Pulse 80   Resp 19   Wt 43.5 kg   SpO2 100%   BMI 17.28 kg/m   Physical Exam Vitals and nursing note reviewed.  Constitutional:      Appearance: She is well-developed.  HENT:     Head: Normocephalic and atraumatic.  Cardiovascular:     Rate and Rhythm: Normal rate and regular rhythm.     Heart sounds: Normal heart sounds.  Pulmonary:     Effort: Pulmonary effort is normal. No tachypnea.     Breath sounds: Normal breath sounds.   Musculoskeletal:     Right lower leg: No edema.     Left lower leg: No edema.  Skin:    General: Skin is warm and dry.  Neurological:     General: No focal deficit present.     Mental Status: She is alert and oriented to person, place, and time.  Psychiatric:        Mood and Affect: Mood normal.        Behavior: Behavior normal.    ED Results / Procedures / Treatments   Labs (all labs ordered are listed, but only abnormal results are displayed) Labs Reviewed  CBC WITH DIFFERENTIAL/PLATELET - Abnormal; Notable for the following components:      Result Value   RBC 3.45 (*)    Hemoglobin 10.4 (*)    HCT 31.9 (*)    All other components within normal limits  COMPREHENSIVE METABOLIC PANEL - Abnormal; Notable for the following components:   Sodium 134 (*)    GFR, Estimated 58 (*)    All other components within normal limits    EKG EKG Interpretation  Date/Time:  Friday May 31 2021 09:27:40 EDT Ventricular Rate:  80 PR Interval:  182 QRS Duration: 83 QT Interval:  371 QTC Calculation: 428 R Axis:   1 Text Interpretation: Sinus rhythm Probable left atrial enlargement normal axis No acute ischemia Confirmed by Pieter Partridge ([id number]) on 05/31/2021 9:43:48 AM  Radiology No results found.  Procedures Procedures   Medications Ordered in ED Medications - No data to display  ED Course  I have reviewed the triage vital signs and the nursing notes.  Pertinent labs & imaging results that were available during my care of the patient were reviewed by me and considered in my medical decision making (see chart for details).    MDM Rules/Calculators/A&P                           CHERYSH EPPERLY presents upon referral from her primary care physician for hyperkalemia.  I was suspicious for possible hemolysis based on her improving renal function.  Today, her metabolic panel was completely normal.  The patient was advised to continue her hydration.  She has been being intentional  about oral fluid intake since a week ago when her renal function had worsened slightly.  She will also continue taking her ARB as she is quite hypertensive today.  Given her lack of hyperkalemia, this is likely appropriate, but she will also discuss the issue with her primary care provider. Final Clinical Impression(s) / ED Diagnoses Final diagnoses:  Primary hypertension    Rx / DC Orders ED Discharge Orders     None        Koleen Distance, MD 05/31/21 1020

## 2021-05-31 NOTE — ED Notes (Signed)
Pt has had a change in her blood pressure medicine over the past month. It was changed from bisoprolol/hctz to losartan.

## 2021-06-03 ENCOUNTER — Telehealth: Payer: Self-pay

## 2021-06-03 NOTE — Telephone Encounter (Signed)
Patient's daughter, Shelby Bradley would like to know that a new BP medication that has sulfur in it and Shelby Bradley is allergic to Sulfur. Please advise.

## 2021-06-03 NOTE — Telephone Encounter (Signed)
Provider spoke with patients daughter

## 2021-06-27 ENCOUNTER — Ambulatory Visit: Payer: Medicare Other

## 2021-07-01 ENCOUNTER — Other Ambulatory Visit: Payer: Self-pay

## 2021-07-01 ENCOUNTER — Ambulatory Visit (INDEPENDENT_AMBULATORY_CARE_PROVIDER_SITE_OTHER): Payer: Medicare Other

## 2021-07-01 VITALS — BP 208/90 | HR 60 | Temp 97.2°F | Wt 98.2 lb

## 2021-07-01 DIAGNOSIS — E538 Deficiency of other specified B group vitamins: Secondary | ICD-10-CM | POA: Diagnosis not present

## 2021-07-01 DIAGNOSIS — E875 Hyperkalemia: Secondary | ICD-10-CM

## 2021-07-01 MED ORDER — CYANOCOBALAMIN 1000 MCG/ML IJ SOLN
1000.0000 ug | Freq: Once | INTRAMUSCULAR | Status: AC
Start: 2021-07-01 — End: 2021-07-01
  Administered 2021-07-01: 1000 ug via INTRAMUSCULAR

## 2021-07-01 NOTE — Progress Notes (Signed)
Patient presents to the office for a B12 injection and have labs done for Hyperkalemia. Patient was given the b12 in the RIGHT deltoid. Patient tolerated well. Vitals taken and recorded. Will return in one month for her next injection.  

## 2021-07-01 NOTE — Addendum Note (Signed)
Addended by: Dionicio Stall on: 07/01/2021 02:52 PM   Modules accepted: Orders

## 2021-07-02 ENCOUNTER — Other Ambulatory Visit: Payer: Self-pay | Admitting: Adult Health

## 2021-07-02 DIAGNOSIS — N1831 Chronic kidney disease, stage 3a: Secondary | ICD-10-CM

## 2021-07-02 DIAGNOSIS — E875 Hyperkalemia: Secondary | ICD-10-CM

## 2021-07-02 LAB — BASIC METABOLIC PANEL WITH GFR
BUN: 22 mg/dL (ref 7–25)
CO2: 27 mmol/L (ref 20–32)
Calcium: 9.2 mg/dL (ref 8.6–10.4)
Chloride: 105 mmol/L (ref 98–110)
Creat: 0.9 mg/dL (ref 0.60–0.95)
Glucose, Bld: 92 mg/dL (ref 65–99)
Potassium: 5.8 mmol/L — ABNORMAL HIGH (ref 3.5–5.3)
Sodium: 138 mmol/L (ref 135–146)
eGFR: 60 mL/min/{1.73_m2} (ref >=60)

## 2021-07-29 ENCOUNTER — Other Ambulatory Visit: Payer: Self-pay

## 2021-07-29 ENCOUNTER — Ambulatory Visit (INDEPENDENT_AMBULATORY_CARE_PROVIDER_SITE_OTHER): Payer: Medicare Other

## 2021-07-29 VITALS — BP 172/72 | HR 61 | Temp 97.7°F | Wt 92.8 lb

## 2021-07-29 DIAGNOSIS — E538 Deficiency of other specified B group vitamins: Secondary | ICD-10-CM | POA: Diagnosis not present

## 2021-07-29 DIAGNOSIS — N1831 Chronic kidney disease, stage 3a: Secondary | ICD-10-CM | POA: Diagnosis not present

## 2021-07-29 DIAGNOSIS — E875 Hyperkalemia: Secondary | ICD-10-CM

## 2021-07-29 MED ORDER — CYANOCOBALAMIN 1000 MCG/ML IJ SOLN
1000.0000 ug | Freq: Once | INTRAMUSCULAR | Status: AC
  Administered 2021-07-29: 1000 ug via INTRAMUSCULAR

## 2021-07-29 NOTE — Progress Notes (Signed)
Patient presents to the office for a B12 injection and have labs done for Hyperkalemia. Patient was given the b12 in the RIGHT deltoid. Patient tolerated well. Vitals taken and recorded. Will return in one month for her next injection.

## 2021-07-30 LAB — BASIC METABOLIC PANEL WITH GFR
BUN/Creatinine Ratio: 24 (calc) — ABNORMAL HIGH (ref 6–22)
BUN: 24 mg/dL (ref 7–25)
CO2: 24 mmol/L (ref 20–32)
Calcium: 9.3 mg/dL (ref 8.6–10.4)
Chloride: 105 mmol/L (ref 98–110)
Creat: 1.01 mg/dL — ABNORMAL HIGH (ref 0.60–0.95)
Glucose, Bld: 95 mg/dL (ref 65–99)
Potassium: 5.3 mmol/L (ref 3.5–5.3)
Sodium: 136 mmol/L (ref 135–146)
eGFR: 53 mL/min/{1.73_m2} — ABNORMAL LOW (ref >=60)

## 2021-08-25 ENCOUNTER — Other Ambulatory Visit: Payer: Self-pay | Admitting: Adult Health

## 2021-08-26 ENCOUNTER — Other Ambulatory Visit: Payer: Self-pay

## 2021-08-26 ENCOUNTER — Ambulatory Visit (INDEPENDENT_AMBULATORY_CARE_PROVIDER_SITE_OTHER): Payer: Medicare Other

## 2021-08-26 VITALS — BP 180/62 | HR 69 | Temp 97.9°F | Wt 96.2 lb

## 2021-08-26 DIAGNOSIS — E538 Deficiency of other specified B group vitamins: Secondary | ICD-10-CM

## 2021-08-26 MED ORDER — CYANOCOBALAMIN 1000 MCG/ML IJ SOLN
1000.0000 ug | Freq: Once | INTRAMUSCULAR | Status: AC
  Administered 2021-08-26: 1000 ug via INTRAMUSCULAR

## 2021-08-26 NOTE — Progress Notes (Signed)
Patient was given the b12 in the LEFT deltoid. Patient tolerated well. Vitals taken and recorded. Will return in one month for her next injection.  °

## 2021-09-13 NOTE — Progress Notes (Signed)
ANNUAL MEDICARE WELLNESS  Assessment:   Annual Medicare Wellness Visit Due annually  Health maintenance reviewed  Essential hypertension At goal per home log <150/90, continue current med - avoid ACEi/ARB and "no salt" due to recurrent hyperkalemia - DASH diet, exercise and monitor at home. Call if greater than 150/90.  -     CMP/GFR  Chronic obstructive pulmonary disease, unspecified COPD type ([place]) Avoid triggers, continue allergy pill  Mixed hyperlipidemia -decrease fatty foods, increase activity.   Hypothyroidism, unspecified type -check TSH level, continue medications the same, reminded to take on an empty stomach 30-82mns before food.   Malnutrition of mild degree (HCC) Continue protein/weight gain/ensure -     CMP/GFR  BMI < 19 Continue ensure, good progress with gaining weight   Other abnormal glucose Monitor, not aggressively pursued due to low BMI Whole foods encouraged, avoid processed carbs  Medication management -     Magnesium  vitamin B12 deficiency, unspecified B12 deficiency type Continue injections, shot today   Anemia, unspecified type Monitor, essentially stable from baseline; continue B12/iron supplements  Vitamin D deficiency Continue supplement  Macular degeneration, unspecified laterality, unspecified type Continue follow up Dr. MZigmund Daniel Gastroesophageal reflux disease, esophagitis presence not specified Continue PPI/H2 blocker, will try lower dose omeprazole 20 mg sent in  diet discussed  Hyperkalemia Resolved, NO MORE NO SALT SUPPLEMENT Off of ACEi/ARB Monitor for recurrence  - CMP/GFR  Peripheral edema Resolved, continue lifestyle  Need for influenza vaccine Had reaction in 2021, declines Discussed immune support, masking and hand hygiene  Sedentary lifetyle Guidelines reivewed; suggested video program 20-30 min daily at home for strength, balance, fall prevention.     Orders Placed This Encounter  Procedures    CBC with Differential/Platelet   COMPLETE METABOLIC PANEL WITH GFR   Magnesium   TSH   Iron, TIBC and Ferritin Panel     Over 40 minutes of exam, counseling, chart review and critical decision making was performed Future Appointments  Date Time Provider DLaie 11/28/2021  2:00 PM CLiane Comber NP [facility] None  09/17/2022  2:00 PM CLiane Comber NP [facility] None     Plan:   During the course of the visit the patient was educated and counseled about appropriate screening and preventive services including:   Pneumococcal vaccine  Prevnar 13 Influenza vaccine Td vaccine Screening electrocardiogram Bone densitometry screening Colorectal cancer screening Diabetes screening Glaucoma screening Nutrition counseling  Advanced directives: requested   Subjective:  Shelby KLETTis a 87y.o. female who presents for AWV. She has Labile hypertension; Hyperlipidemia, mixed; B12 deficiency; Normochromic normocytic anemia; Gastroesophageal reflux disease; Vitamin D deficiency; Abnormal glucose; Medication management; Hypothyroidism; Macular degeneration; Lower back pain; COPD (chronic obstructive pulmonary disease) ([place]; Mal[place] of mild degree ([place]; Stage 3 chronic kidney disease ([facility]; Hyperkalemia; BMI less than 19,adult; Peripheral edema; Iron deficiency; External hemorrhoids; and Elevated LFTs on their problem list.   Her daughter accompanies her today.   She has chronic hearing loss in left ear from mastoid sx when she was younger, wears hearing aids. She lives by herself, daughter/son and close by and check on her regularly. Ambulates with a cane, carries cell phone with her at all times. Drives very short distances if needed, but daughter typically accompanies her.   She has had hyponatremia and recurrent hyperkalemia episodes, attributed to "no salt" use and also now off of ACEi/ARB due to recurrent.   Follows up with Dr. GKaty Fitch and he sent her to Dr.  MRodman Keyfor  mac degeneration, improved with injections and has since been released. She is overdue to follow up with Dr. Katy Fitch.   She takes amitriptyline 1/2 pill at night for sleep. She will take xanax 0.55m occ during the day for anxiety, will take 1/2 at least once a week, needing less recently.   Currently taking omeprazole 40 mg daily fr many years without breakthrough reflux. Has been on chronically following stricture dilation in 2010.   BMI is Body mass index is 16.99 kg/m., she is working on diet and exercise, she is working on weight gain/being stable. She is on boost/ensure intermittently, reports gets too full when takes daily. Admits hasn't been walking, receptive to in home video program.  Wt Readings from Last 3 Encounters:  09/17/21 94 lb 6.4 oz (42.8 kg)  08/26/21 96 lb 3.2 oz (43.6 kg)  07/29/21 92 lb 12.8 oz (42.1 kg)   Her blood pressure has been controlled at home, today their BP is BP: (!) 148/82,   She does workout. She denies chest pain, shortness of breath, dizziness, HA, vision changes.    She is not on cholesterol medication and denies myalgias. Her cholesterol is at goal. The cholesterol last visit was:   Lab Results  Component Value Date   CHOL 141 11/28/2020   HDL 60 11/28/2020   LDLCALC 65 11/28/2020   TRIG 78 11/28/2020   CHOLHDL 2.4 11/28/2020   She does not have diabetes, has CKD due to HTN and age. Last GFR was: Lab Results  Component Value Date   GFRNONAA 58 (L) 05/31/2021   GFRNONAA 49 (L) 04/29/2021   GFRNONAA 42 (L) 04/22/2021   Patient is on Vitamin D supplement, taking 5000 IU daily  Lab Results  Component Value Date   VD25OH 84803/06/2020     She is on thyroid medication. Her medication was not changed last visit, 112 mcg daily.  Lab Results  Component Value Date   TSH 3.30 04/22/2021   On review baseline hemoglobin has been in 9-10 range She had reticulocyctes checked by Dr. MMelford Aaseon 08/23/2020 that showed appropriate  responese CBC Latest Ref Rng & Units 05/31/2021 05/23/2021 04/22/2021  WBC 4.0 - 10.5 K/uL 4.3 6.3 5.4  Hemoglobin 12.0 - 15.0 g/dL 10.4(L) 10.5(L) 9.7(L)  Hematocrit 36.0 - 46.0 % 31.9(L) 32.5(L) 29.8(L)  Platelets 150 - 400 K/uL 180 210 218   She has had intermittent iron def, on iron supplement, taking 143 mg BID since last visit with vitamin C Lab Results  Component Value Date   IRON 61 04/22/2021   TIBC 213 (L) 04/22/2021   FERRITIN 167 04/22/2021   She has been getting monthly b12 injections -  Lab Results  Component Value Date   VITAMINB12 >2,000 (H) 03/06/2020     Medication Review:  Current Outpatient Medications (Endocrine & Metabolic):    levothyroxine (SYNTHROID) 112 MCG tablet, Take 1 tablet (112 mcg total) by mouth daily before breakfast.  Current Outpatient Medications (Cardiovascular):    bisoprolol-hydrochlorothiazide (ZIAC) 5-6.25 MG tablet, Take 1 tablet daily for BP    Current Outpatient Medications (Hematological):    cyanocobalamin (,VITAMIN B-12,) 1000 MCG/ML injection, INJECT 1 MILLILITER INTO THE SKIN EVERY 30 DAYS   ferrous sulfate 325 (65 FE) MG tablet, Take 325 mg by mouth daily. Takes 146mBID  Current Outpatient Medications (Other):    ALPRAZolam (XANAX) 0.25 MG tablet, TAKE 1/2 TO 1 TABLET AT BEDTIME ONLY IF NEEDED FOR SLEEP & PLEASE TRY TO LIMIT TO 5 DAYS /WEEK  TO AVOID ADDICTION & DEMENTIA (Patient taking differently: Take 1/2 to 1 tablet    at Bedtime   ONLY       if needed for Sleep & please try to limit to 5 days /week to avoid Addiction & Dementia)   Cholecalciferol (VITAMIN D-3) 5000 UNITS TABS, Take 5,000 Units by mouth daily.   diclofenac Sodium (VOLTAREN) 1 % GEL, Apply 1 application topically 4 (four) times daily.   Probiotic Product (RESTORA) CAPS, TAKE 1 TABLET DAILY FOR INTESTINAL HEALTHT   amitriptyline (ELAVIL) 50 MG tablet, Take 0.5 tablets (25 mg total) by mouth at bedtime.   omeprazole (PRILOSEC) 20 MG capsule, Take  1 capsule   Daily to Prevent Heartburn & Indigestion.   Allergies  Allergen Reactions   Norvasc [Amlodipine Besylate]    Sulfa Antibiotics     Current Problems (verified) Patient Active Problem List   Diagnosis Date Noted   Elevated LFTs 04/23/2021   External hemorrhoids 12/03/2020   Iron deficiency 11/28/2020   Peripheral edema 09/17/2020   BMI less than 19,adult 09/14/2020   Hyperkalemia 08/14/2020   Stage 3 chronic kidney disease ([place]) 09/12/2019   Malnutrition of mild degree ([place]) 11/18/2016   COPD (chronic obstructive pulmonary disease) ([place]) 07/10/2016   Hypothyroidism 06/28/2015   Macular degeneration 06/28/2015   Lower back pain 06/28/2015   Medication management 12/05/2014   Labile hypertension    Hyperlipidemia, mixed    B12 deficiency    Normochromic normocytic anemia    Gastroesophageal reflux disease    Vitamin D deficiency    Abnormal glucose     Screening Tests Immunization History  Administered Date(s) Administered   DTaP 05/20/2012   Fluad Quad(high Dose 65+) 08/13/2020   Influenza Whole 07/21/2013   Influenza, High Dose Seasonal PF 06/29/2014, 07/30/2015, 07/10/2016, 09/07/2017, 08/10/2018, 08/24/2019   PFIZER(Purple Top)SARS-COV-2 Vaccination 01/01/2020, 01/31/2020   Pneumococcal Conjugate-13 05/29/2014   Pneumococcal Polysaccharide-23 05/07/2010, 12/05/2014   Tdap 05/20/2012   Zoster, Live 11/03/2006   Preventative care: Last colonoscopy: 2010 will not get another Last mammogram: 2009 declines another Last pap smear/pelvic exam: remote DEXA: 2013 osteoporosis, declines another, would not take bisphosphonates  Prior vaccinations: TD or Tdap: 2013  Influenza: 08/2020, had reaction, declines this year.  Pneumococcal: 2016 Prevnar13: 2015 Shingles/Zostavax: 2008 Covid 19: 2/2, 2021, pfizer   Names of Other Physician/Practitioners you currently use: 1. [facility] here for primary care 2. Dr. Madelyn Brunner  (released), eye doctor, 2021, encouraged to schedule 3. Ronnell Freshwater, dentist, once a year, last 2021  Patient Care Team: Unk Pinto, MD as PCP - General (Internal Medicine) Laurence Spates, MD (Inactive) as Consulting Physician (Gastroenterology) Berenice Primas, MD as Referring Physician (Orthopedic Surgery) Warden Fillers, MD as Consulting Physician (Optometry)  SURGICAL HISTORY She  has a past surgical history that includes Cataract extraction (Bilateral); Cholecystectomy (N/A, 01/18/2019); and ORIF distal femur fracture (Left). FAMILY HISTORY Her family history includes COPD in her father; Heart attack in her brother and brother; Hypertension in her mother. SOCIAL HISTORY She  reports that she has never smoked. She has never used smokeless tobacco. She reports that she does not drink alcohol and does not use drugs.  MEDICARE WELLNESS OBJECTIVES: Physical activity: Current Exercise Habits: The patient does not participate in regular exercise at present, Exercise limited by: None identified Cardiac risk factors: Cardiac Risk Factors include: advanced age (>89mn, >>69women);hypertension;sedentary lifestyle Depression/mood screen:   [facility] 09/17/2021  Decreased Interest 0  Down, Depressed, Hopeless 0  PHQ - 2 Score 0  Some recent data might be hidden    ADLs:  In your present state of health, do you have any difficulty performing the following activities: 09/17/2021 09/17/2020  Hearing? N N  Comment - has bil hearing aides  Vision? N N  Difficulty concentrating or making decisions? N N  Walking or climbing stairs? N N  Dressing or bathing? N N  Doing errands, shopping? N N  Comment daughter drives if needed Facilities manager and eating ? N -  Using the Toilet? N -  In the past six months, have you accidently leaked urine? N -  Do you have problems with loss of bowel control? N -  Managing your Medications? N -  Managing your Finances? N -   Housekeeping or managing your Housekeeping? N -  Some recent data might be hidden     Cognitive Testing  Alert? Yes  Normal Appearance?Yes  Oriented to person? Yes  Place? Yes   Time? Yes  Recall of three objects?  Yes  Can perform simple calculations? Yes  Displays appropriate judgment?Yes  Can read the correct time from a watch face?Yes  EOL planning: Does Patient Have a Medical Advance Directive?: Yes Type of Advance Directive: Healthcare Power of Attorney, Living will Does patient want to make changes to medical advance directive?: No - Patient declined Copy of [place] in Chart?: No - copy requested     Review of Systems  Constitutional:  Negative for chills, fever and malaise/fatigue.  HENT:  Negative for congestion, ear pain and sore throat.   Eyes: Negative.   Respiratory:  Negative for cough, shortness of breath and wheezing.   Cardiovascular:  Positive for leg swelling (dependent edema, daily last few months). Negative for chest pain and palpitations.  Gastrointestinal:  Negative for abdominal pain, blood in stool, constipation, diarrhea, heartburn and melena.  Genitourinary: Negative.   Musculoskeletal:  Negative for back pain and falls.  Skin: Negative.   Neurological:  Negative for dizziness, sensory change, loss of consciousness and headaches.  Psychiatric/Behavioral:  Negative for depression. The patient is not nervous/anxious and does not have insomnia.     Objective:     Today's Vitals   09/17/21 1431 09/17/21 1501  BP: (!) 170/60 (!) 148/82  Pulse: 64   Temp: (!) 97.5 F (36.4 C)   SpO2: 99%   Weight: 94 lb 6.4 oz (42.8 kg)    Body mass index is 16.99 kg/m.  General appearance: alert, no distress, WD/WN,  female HEENT: Left eye ptosis, normocephalic, sclerae anicteric, TM pearly on left, right TM white/scarred or absent TM, nares patent, no discharge or erythema, pharynx normal Oral cavity: MMM, no lesions Neck: supple, no  lymphadenopathy, no thyromegaly, no masses Heart: RRR, normal S1, S2, no murmurs Lungs: CTA bilaterally, no wheezes, rhonchi, or rales Abdomen: +bs, soft, non tender, non distended, no masses, no hepatomegaly, no splenomegaly Musculoskeletal: nontender, no effusion, no obvious deformity, mild- moderate cervical kyphosis Extremities: , no cyanosis, no clubbing, no edema Pulses: 2+ symmetric, upper and lower extremities, normal cap refill Neurological: alert, oriented x 3, CN2-12 intact, strength normal upper extremities and lower extremities, sensation normal throughout, DTRs 2+ throughout, no cerebellar signs, gait slow steady with cane Psychiatric: normal affect, behavior normal, pleasant    Medicare Attestation I have personally reviewed: The patient's medical and social history Their use of alcohol, tobacco or illicit drugs Their current medications and supplements The patient's functional ability including  ADLs,fall risks, home safety risks, cognitive, and hearing and visual impairment Diet and physical activities Evidence for depression or mood disorders  The patient's weight, height, BMI, and visual acuity have been recorded in the chart.  I have made referrals, counseling, and provided education to the patient based on review of the above and I have provided the patient with a written personalized care plan for preventive services.      Izora Ribas, NP   09/17/2021

## 2021-09-17 ENCOUNTER — Ambulatory Visit: Payer: Medicare Other | Admitting: Adult Health

## 2021-09-17 ENCOUNTER — Encounter: Payer: Self-pay | Admitting: Adult Health

## 2021-09-17 ENCOUNTER — Ambulatory Visit (INDEPENDENT_AMBULATORY_CARE_PROVIDER_SITE_OTHER): Payer: Medicare Other | Admitting: Adult Health

## 2021-09-17 ENCOUNTER — Other Ambulatory Visit: Payer: Self-pay

## 2021-09-17 VITALS — BP 148/82 | HR 64 | Temp 97.5°F | Wt 94.4 lb

## 2021-09-17 DIAGNOSIS — D649 Anemia, unspecified: Secondary | ICD-10-CM

## 2021-09-17 DIAGNOSIS — E538 Deficiency of other specified B group vitamins: Secondary | ICD-10-CM

## 2021-09-17 DIAGNOSIS — K219 Gastro-esophageal reflux disease without esophagitis: Secondary | ICD-10-CM

## 2021-09-17 DIAGNOSIS — R6889 Other general symptoms and signs: Secondary | ICD-10-CM

## 2021-09-17 DIAGNOSIS — N1831 Chronic kidney disease, stage 3a: Secondary | ICD-10-CM | POA: Diagnosis not present

## 2021-09-17 DIAGNOSIS — H3530 Unspecified macular degeneration: Secondary | ICD-10-CM | POA: Diagnosis not present

## 2021-09-17 DIAGNOSIS — R609 Edema, unspecified: Secondary | ICD-10-CM

## 2021-09-17 DIAGNOSIS — Z681 Body mass index (BMI) 19 or less, adult: Secondary | ICD-10-CM | POA: Diagnosis not present

## 2021-09-17 DIAGNOSIS — E039 Hypothyroidism, unspecified: Secondary | ICD-10-CM

## 2021-09-17 DIAGNOSIS — J449 Chronic obstructive pulmonary disease, unspecified: Secondary | ICD-10-CM

## 2021-09-17 DIAGNOSIS — Z9189 Other specified personal risk factors, not elsewhere classified: Secondary | ICD-10-CM

## 2021-09-17 DIAGNOSIS — Z79899 Other long term (current) drug therapy: Secondary | ICD-10-CM

## 2021-09-17 DIAGNOSIS — E441 Mild protein-calorie malnutrition: Secondary | ICD-10-CM | POA: Diagnosis not present

## 2021-09-17 DIAGNOSIS — Z0001 Encounter for general adult medical examination with abnormal findings: Secondary | ICD-10-CM

## 2021-09-17 DIAGNOSIS — Z0000 Encounter for general adult medical examination without abnormal findings: Secondary | ICD-10-CM

## 2021-09-17 DIAGNOSIS — E559 Vitamin D deficiency, unspecified: Secondary | ICD-10-CM

## 2021-09-17 DIAGNOSIS — R0989 Other specified symptoms and signs involving the circulatory and respiratory systems: Secondary | ICD-10-CM | POA: Diagnosis not present

## 2021-09-17 DIAGNOSIS — K644 Residual hemorrhoidal skin tags: Secondary | ICD-10-CM | POA: Diagnosis not present

## 2021-09-17 DIAGNOSIS — E875 Hyperkalemia: Secondary | ICD-10-CM

## 2021-09-17 DIAGNOSIS — R7309 Other abnormal glucose: Secondary | ICD-10-CM | POA: Diagnosis not present

## 2021-09-17 DIAGNOSIS — E782 Mixed hyperlipidemia: Secondary | ICD-10-CM

## 2021-09-17 MED ORDER — OMEPRAZOLE 20 MG PO CPDR: DELAYED_RELEASE_CAPSULE | ORAL | 3 refills | Status: DC

## 2021-09-17 MED ORDER — AMITRIPTYLINE HCL 50 MG PO TABS: 25.0000 mg | ORAL_TABLET | Freq: Every day | ORAL | 1 refills | Status: DC

## 2021-09-17 NOTE — Progress Notes (Signed)
Patient received B12, .104mL in right deltoid. Isn't due until next week to receive the full dose. Patient will return in 5-6 weeks for her next injection.

## 2021-09-18 ENCOUNTER — Other Ambulatory Visit: Payer: Self-pay | Admitting: Adult Health

## 2021-09-18 DIAGNOSIS — D649 Anemia, unspecified: Secondary | ICD-10-CM

## 2021-09-18 DIAGNOSIS — N1831 Chronic kidney disease, stage 3a: Secondary | ICD-10-CM

## 2021-09-18 DIAGNOSIS — E039 Hypothyroidism, unspecified: Secondary | ICD-10-CM

## 2021-09-18 LAB — COMPLETE METABOLIC PANEL WITH GFR
AG Ratio: 1.4 (calc) (ref 1.0–2.5)
ALT: 7 U/L (ref 6–29)
AST: 19 U/L (ref 10–35)
Albumin: 3.8 g/dL (ref 3.6–5.1)
Alkaline phosphatase (APISO): 59 U/L (ref 37–153)
BUN/Creatinine Ratio: 21 (calc) (ref 6–22)
BUN: 25 mg/dL (ref 7–25)
CO2: 28 mmol/L (ref 20–32)
Calcium: 9.2 mg/dL (ref 8.6–10.4)
Chloride: 102 mmol/L (ref 98–110)
Creat: 1.17 mg/dL — ABNORMAL HIGH (ref 0.60–0.95)
Globulin: 2.7 g/dL (calc) (ref 1.9–3.7)
Glucose, Bld: 95 mg/dL (ref 65–99)
Potassium: 5.6 mmol/L — ABNORMAL HIGH (ref 3.5–5.3)
Sodium: 136 mmol/L (ref 135–146)
Total Bilirubin: 0.5 mg/dL (ref 0.2–1.2)
Total Protein: 6.5 g/dL (ref 6.1–8.1)
eGFR: 44 mL/min/{1.73_m2} — ABNORMAL LOW (ref >=60)

## 2021-09-18 LAB — CBC WITH DIFFERENTIAL/PLATELET
Absolute Monocytes: 439 cells/uL (ref 200–950)
Basophils Absolute: 43 cells/uL (ref 0–200)
Basophils Relative: 0.7 %
Eosinophils Absolute: 18 cells/uL (ref 15–500)
Eosinophils Relative: 0.3 %
HCT: 30.5 % — ABNORMAL LOW (ref 35.0–45.0)
Hemoglobin: 9.8 g/dL — ABNORMAL LOW (ref 11.7–15.5)
Lymphs Abs: 1342 cells/uL (ref 850–3900)
MCH: 30.2 pg (ref 27.0–33.0)
MCHC: 32.1 g/dL (ref 32.0–36.0)
MCV: 93.8 fL (ref 80.0–100.0)
MPV: 10 fL (ref 7.5–12.5)
Monocytes Relative: 7.2 %
Neutro Abs: 4258 cells/uL (ref 1500–7800)
Neutrophils Relative %: 69.8 %
Platelets: 178 10*3/uL (ref 140–400)
RBC: 3.25 10*6/uL — ABNORMAL LOW (ref 3.80–5.10)
RDW: 13.1 % (ref 11.0–15.0)
Total Lymphocyte: 22 %
WBC: 6.1 10*3/uL (ref 3.8–10.8)

## 2021-09-18 LAB — IRON,TIBC AND FERRITIN PANEL
%SAT: 22 % (calc) (ref 16–45)
Ferritin: 120 ng/mL (ref 16–288)
Iron: 54 ug/dL (ref 45–160)
TIBC: 245 mcg/dL (calc) — ABNORMAL LOW (ref 250–450)

## 2021-09-18 LAB — MAGNESIUM: Magnesium: 1.8 mg/dL (ref 1.5–2.5)

## 2021-09-18 LAB — TSH: TSH: 4.57 mIU/L — ABNORMAL HIGH (ref 0.40–4.50)

## 2021-10-10 ENCOUNTER — Other Ambulatory Visit: Payer: Self-pay | Admitting: Internal Medicine

## 2021-10-10 ENCOUNTER — Telehealth: Payer: Self-pay | Admitting: Internal Medicine

## 2021-10-10 NOTE — Chronic Care Management (AMB) (Signed)
  Chronic Care Management   Outreach Note  10/10/2021 Name: Shelby Bradley MRN: [id number] DOB: 1927/12/12  Referred by: Lucky Cowboy, MD Reason for referral : No chief complaint on file.   An unsuccessful telephone outreach was attempted today. The patient was referred to the pharmacist for assistance with care management and care coordination.   Follow Up Plan:   Tatjana Dellinger Upstream Scheduler

## 2021-10-21 ENCOUNTER — Ambulatory Visit (INDEPENDENT_AMBULATORY_CARE_PROVIDER_SITE_OTHER): Payer: Medicare Other

## 2021-10-21 ENCOUNTER — Other Ambulatory Visit: Payer: Self-pay

## 2021-10-21 VITALS — BP 200/80 | HR 71 | Temp 97.7°F | Wt 98.2 lb

## 2021-10-21 DIAGNOSIS — E538 Deficiency of other specified B group vitamins: Secondary | ICD-10-CM | POA: Diagnosis not present

## 2021-10-21 MED ORDER — CYANOCOBALAMIN 1000 MCG/ML IJ SOLN
1000.0000 ug | Freq: Once | INTRAMUSCULAR | Status: AC
  Administered 2021-10-21: 14:00:00 1000 ug via INTRAMUSCULAR

## 2021-10-21 NOTE — Progress Notes (Signed)
Patient was given the b12 in the LEFT deltoid. Patient tolerated well. Vitals taken and recorded. Will return in one month for her next injection.

## 2021-10-22 ENCOUNTER — Ambulatory Visit: Payer: Medicare Other

## 2021-11-25 ENCOUNTER — Other Ambulatory Visit: Payer: Self-pay

## 2021-11-25 ENCOUNTER — Ambulatory Visit (INDEPENDENT_AMBULATORY_CARE_PROVIDER_SITE_OTHER): Payer: Medicare Other

## 2021-11-25 VITALS — BP 150/74 | HR 59 | Temp 97.5°F | Wt 96.2 lb

## 2021-11-25 DIAGNOSIS — E538 Deficiency of other specified B group vitamins: Secondary | ICD-10-CM | POA: Diagnosis not present

## 2021-11-25 MED ORDER — BISOPROLOL-HYDROCHLOROTHIAZIDE 5-6.25 MG PO TABS: ORAL_TABLET | ORAL | 1 refills | Status: DC

## 2021-11-25 MED ORDER — CYANOCOBALAMIN 1000 MCG/ML IJ SOLN: 1000.0000 ug | Freq: Once | INTRAMUSCULAR | Status: DC

## 2021-11-25 NOTE — Progress Notes (Signed)
Patient was given the b12 1 mL in the RIGHT deltoid. Patient tolerated well. Vitals taken and recorded. Will return in one month for her next injection.

## 2021-11-26 MED ORDER — CYANOCOBALAMIN 1000 MCG/ML IJ SOLN
1000.0000 ug | Freq: Once | INTRAMUSCULAR | Status: AC
  Administered 2021-11-26: 17:00:00 1000 ug via INTRAMUSCULAR

## 2021-11-28 ENCOUNTER — Encounter: Payer: Medicare Other | Admitting: Adult Health

## 2021-12-10 NOTE — Progress Notes (Deleted)
Assessment and Plan: ORDER LABS STAT  Shelby Bradley was seen today for follow-up.  Diagnoses and all orders for this visit:  Hypertension, labile Doing well with losartan 50 mg daily  Monitor blood pressure at home; call if consistently over 150/90 Stop/hold med if any BPs <110/70 or dizziness or fatigue Continue DASH diet.   Reminder to go to the ER if any CP, SOB, nausea, dizziness, severe HA, changes vision/speech, left arm numbness and tingling and jaw pain. Follow up 1 month for recheck and BMP/GFR or sooner if concerns Daughter will assist with close monitoring -     losartan (COZAAR) 25 MG tablet; Take 1-2 tablet (25 mg total) by mouth daily for BP goal <150/90  Iron deficiency Has been on iron supplement 65 mg slow release daily and tolerating well  Recheck CBC and iron Hematology if trending down any further or poor response with oral iron supplement   Anemia, unspecified type Chronic, predates 2011; improved on iron Recheck hemoccult if any drop  CKD III/microalbuminuria (HCC) CKD may be contributing to anemia; recheck labs today Continue losartan/ARB and tighter BP control if possible  B12 def  Injection given today  Further disposition pending results of labs. Discussed med's effects and SE's.   Over 30 minutes of exam, counseling, chart review, and critical decision making was performed.   Future Appointments  Date Time Provider [place]  12/11/2021 11:00 AM Magda Bernheim, NP [facility] None  12/30/2021  2:00 PM [facility] NURSE [facility] None  01/08/2022  2:00 PM Liane Comber, NP [facility] None  09/17/2022  2:00 PM Liane Comber, NP [facility] None    ------------------------------------------------------------------------------------------------------------------   HPI There were no vitals taken for this visit.  88 y.o.female presents for follow up on labile hypertension, also persistent iron deficiency anemia and due for B12 injection.   She has  been taking losartan 50 mg daily with home log demonstrating 130/70-160/90 range.   Today their BP is  , similar by recheck  She denies chest pain, shortness of breath, dizziness.  She has chronic/persistent normocytic anemia for many years, She has been on iron supplement since, taking 65 mg slow release with orange juice and tolerating well. Her last colonoscopy was in 2010, appears was benign other than external hemorrhoids without further follow up recommended due to benign history and age. She had recent normal DRE/hemoccult.  CBC Latest Ref Rng & Units 09/17/2021 05/31/2021 05/23/2021  WBC 3.8 - 10.8 Thousand/uL 6.1 4.3 6.3  Hemoglobin 11.7 - 15.5 g/dL 9.8(L) 10.4(L) 10.5(L)  Hematocrit 35.0 - 45.0 % 30.5(L) 31.9(L) 32.5(L)  Platelets 140 - 400 Thousand/uL 178 180 210   Lab Results  Component Value Date   IRON 54 09/17/2021   TIBC 245 (L) 09/17/2021   FERRITIN 120 09/17/2021   She is on thyroid medication. Her medication was not changed last visit.  She is taking 112 mcg tabs, 1 daily, 30-60 min prior to other meds.  Lab Results  Component Value Date   TSH 4.57 (H) 09/17/2021   Patient is on Vitamin D supplement.   Lab Results  Component Value Date   VD25OH 88 01/09/2020     She did have B12 injection today;  Lab Results  Component Value Date   VITAMINB12 >2,000 (H) 03/06/2020     Past Medical History:  Diagnosis Date   Acute cholecystitis 01/18/2019   Acute kidney injury (nontraumatic) (HCC)    Allergy    Anemia    Anxiety    DDD (degenerative  disc disease), lumbar    Depression    GERD (gastroesophageal reflux disease)    Hyperlipidemia    Hypertension    OA (osteoarthritis) of knee    right knee   Pre-diabetes    Thyroid disease    Vitamin D deficiency      Allergies  Allergen Reactions   Norvasc [Amlodipine Besylate]    Sulfa Antibiotics     Current Outpatient Medications on File Prior to Visit  Medication Sig   ALPRAZolam (XANAX) 0.25 MG tablet  TAKE 1/2 TO 1 TABLET AT BEDTIME ONLY IF NEEDED FOR SLEEP & PLEASE TRY TO LIMIT TO 5 DAYS /WEEK TO AVOID ADDICTION & DEMENTIA (Patient taking differently: Take 1/2 to 1 tablet    at Bedtime   ONLY       if needed for Sleep & please try to limit to 5 days /week to avoid Addiction & Dementia)   amitriptyline (ELAVIL) 50 MG tablet Take 0.5 tablets (25 mg total) by mouth at bedtime.   bisoprolol-hydrochlorothiazide (ZIAC) 5-6.25 MG tablet Take 1 tablet daily for BP   Cholecalciferol (VITAMIN D-3) 5000 UNITS TABS Take 5,000 Units by mouth daily.   cyanocobalamin (,VITAMIN B-12,) 1000 MCG/ML injection INJECT 1 MILLILITER INTO THE SKIN EVERY 30 DAYS   diclofenac Sodium (VOLTAREN) 1 % GEL Apply 1 application topically 4 (four) times daily.   ferrous sulfate 325 (65 FE) MG tablet Take 325 mg by mouth daily. Takes 143mg  BID   levothyroxine (SYNTHROID) 112 MCG tablet Take 1 tablet (112 mcg total) by mouth daily before breakfast.   omeprazole (PRILOSEC) 20 MG capsule Take  1 capsule  Daily to Prevent Heartburn & Indigestion.   Probiotic Product (RESTORA) CAPS TAKE 1 TABLET DAILY FOR INTESTINAL HEALTHT   No current facility-administered medications on file prior to visit.    ROS: all negative except above.   Physical Exam:  There were no vitals taken for this visit.  General Appearance: Thin/frail elder female, well dressed, in no apparent distress. Eyes: PERRLA, conjunctiva no swelling or erythema ENT/Mouth: Ext aud canals clear, TMs without erythema, bulging. No erythema, swelling, or exudate on post pharynx.  Tonsils not swollen or erythematous. Hearing normal.  Neck: Supple, thyroid normal.  Respiratory: Respiratory effort normal, BS equal bilaterally without rales, rhonchi, wheezing or stridor.  Cardio: RRR with no MRGs. Brisk peripheral pulses without edema.   Abdomen: Soft, + BS.  Non tender, no guarding, rebound, hernias, masses. Lymphatics: Non tender without lymphadenopathy.  Musculoskeletal:  Mild/mod kyphosis; slow steady gait with cane;  Skin: Warm, dry without rashes, lesions, ecchymosis.  Neuro: Cranial nerves intact. Normal muscle tone, no cerebellar symptoms. Sensation intact.  Psych: Awake and oriented X 3, normal affect, Insight and Judgment appropriate.     Magda Bernheim, NP 11:44 AM [facility]

## 2021-12-11 ENCOUNTER — Encounter (HOSPITAL_BASED_OUTPATIENT_CLINIC_OR_DEPARTMENT_OTHER): Payer: Self-pay

## 2021-12-11 ENCOUNTER — Other Ambulatory Visit: Payer: Self-pay

## 2021-12-11 ENCOUNTER — Emergency Department (HOSPITAL_BASED_OUTPATIENT_CLINIC_OR_DEPARTMENT_OTHER)
Admission: EM | Admit: 2021-12-11 | Discharge: 2021-12-11 | Disposition: A | Discharge status: Home / Self Care | Payer: Medicare Other | Attending: Emergency Medicine | Admitting: Emergency Medicine

## 2021-12-11 ENCOUNTER — Ambulatory Visit: Payer: Medicare Other | Admitting: Nurse Practitioner

## 2021-12-11 DIAGNOSIS — D649 Anemia, unspecified: Secondary | ICD-10-CM

## 2021-12-11 DIAGNOSIS — R197 Diarrhea, unspecified: Secondary | ICD-10-CM | POA: Diagnosis not present

## 2021-12-11 DIAGNOSIS — Z20822 Contact with and (suspected) exposure to covid-19: Secondary | ICD-10-CM | POA: Insufficient documentation

## 2021-12-11 DIAGNOSIS — E871 Hypo-osmolality and hyponatremia: Secondary | ICD-10-CM | POA: Diagnosis not present

## 2021-12-11 DIAGNOSIS — R7989 Other specified abnormal findings of blood chemistry: Secondary | ICD-10-CM | POA: Diagnosis not present

## 2021-12-11 DIAGNOSIS — N1831 Chronic kidney disease, stage 3a: Secondary | ICD-10-CM

## 2021-12-11 DIAGNOSIS — E039 Hypothyroidism, unspecified: Secondary | ICD-10-CM

## 2021-12-11 LAB — URINALYSIS, ROUTINE W REFLEX MICROSCOPIC
Bilirubin Urine: NEGATIVE
Glucose, UA: NEGATIVE mg/dL
Ketones, ur: NEGATIVE mg/dL
Nitrite: NEGATIVE
Protein, ur: NEGATIVE mg/dL
Specific Gravity, Urine: 1.014 (ref 1.005–1.030)
pH: 5.5 (ref 5.0–8.0)

## 2021-12-11 LAB — COMPREHENSIVE METABOLIC PANEL
ALT: 64 U/L — ABNORMAL HIGH (ref 0–44)
AST: 80 U/L — ABNORMAL HIGH (ref 15–41)
Albumin: 3.8 g/dL (ref 3.5–5.0)
Alkaline Phosphatase: 85 U/L (ref 38–126)
Anion gap: 11 (ref 5–15)
BUN: 30 mg/dL — ABNORMAL HIGH (ref 8–23)
CO2: 22 mmol/L (ref 22–32)
Calcium: 9.2 mg/dL (ref 8.9–10.3)
Chloride: 97 mmol/L — ABNORMAL LOW (ref 98–111)
Creatinine, Ser: 1.39 mg/dL — ABNORMAL HIGH (ref 0.44–1.00)
GFR, Estimated: 36 mL/min — ABNORMAL LOW (ref >60)
Glucose, Bld: 119 mg/dL — ABNORMAL HIGH (ref 70–99)
Potassium: 3.9 mmol/L (ref 3.5–5.1)
Sodium: 130 mmol/L — ABNORMAL LOW (ref 135–145)
Total Bilirubin: 1.1 mg/dL (ref 0.3–1.2)
Total Protein: 6.8 g/dL (ref 6.5–8.1)

## 2021-12-11 LAB — CBC
HCT: 34.6 % — ABNORMAL LOW (ref 36.0–46.0)
Hemoglobin: 11.3 g/dL — ABNORMAL LOW (ref 12.0–15.0)
MCH: 29.6 pg (ref 26.0–34.0)
MCHC: 32.7 g/dL (ref 30.0–36.0)
MCV: 90.6 fL (ref 80.0–100.0)
Platelets: 195 10*3/uL (ref 150–400)
RBC: 3.82 MIL/uL — ABNORMAL LOW (ref 3.87–5.11)
RDW: 14.1 % (ref 11.5–15.5)
WBC: 8.2 10*3/uL (ref 4.0–10.5)
nRBC: 0 % (ref 0.0–0.2)

## 2021-12-11 LAB — RESP PANEL BY RT-PCR (FLU A&B, COVID) ARPGX2
Influenza A by PCR: NEGATIVE
Influenza B by PCR: NEGATIVE
SARS Coronavirus 2 by RT PCR: NEGATIVE

## 2021-12-11 LAB — LIPASE, BLOOD: Lipase: 74 U/L — ABNORMAL HIGH (ref 11–51)

## 2021-12-11 MED ORDER — LACTATED RINGERS IV BOLUS
500.0000 mL | Freq: Once | INTRAVENOUS | Status: AC
  Administered 2021-12-11: 500 mL via INTRAVENOUS

## 2021-12-11 NOTE — ED Notes (Addendum)
Pt given water per request. Dr. Bernette Mayers agreed.

## 2021-12-11 NOTE — ED Notes (Signed)
Md notified of BP

## 2021-12-11 NOTE — ED Triage Notes (Signed)
Patient here POV from Home with Diarrhea and Weakness.  Patient fell on 12/03/21. Patient reports No Blood Thinners and No Head Injury.  Patient now endorses Diarrhea since this past weekend. OTC Medications such as Imodium have been ineffective and Iron Supplement made Diarrhea worse.  No Pain Currently. No Fevers.  NAD Noted during Triage. A&Ox4. GCS 15. BIB Wheelchair.

## 2021-12-11 NOTE — ED Notes (Signed)
Pt ambulated well. Did not report any dizziness or unsteadiness.

## 2021-12-11 NOTE — ED Provider Notes (Signed)
[facility] EMERGENCY DEPT  Provider Note  CSN: [id number] Arrival date & time: 12/11/21 1220  History Chief Complaint  Patient presents with   Diarrhea    Shelby Bradley is a 88 y.o. female presents from home with her daughter who provide much of the history. Patient had some diarrhea sevreal days ago, which seemed to have resolved with imodium. She was feeling a little lightheaded afterwards so an appointment was made with PCP. She is supposed to be taking iron supplments but had been off of them for a couple of months. She took one iron pill yesterday around 2pm and then around 3am began having diffuse runny diarrhea, initially was dark but then brown. She took some imodium again this morning and the diarrhea has stopped. She has not had any fever, vomiting, abdominal pain or dysuria.    Home Medications Prior to Admission medications   Medication Sig Start Date End Date Taking? Authorizing Provider  ALPRAZolam (XANAX) 0.25 MG tablet TAKE 1/2 TO 1 TABLET AT BEDTIME ONLY IF NEEDED FOR SLEEP & PLEASE TRY TO LIMIT TO 5 DAYS /WEEK TO AVOID ADDICTION & DEMENTIA Patient taking differently: Take 1/2 to 1 tablet    at Bedtime   ONLY       if needed for Sleep & please try to limit to 5 days /week to avoid Addiction & Dementia 07/29/20   Lucky Cowboy, MD  amitriptyline (ELAVIL) 50 MG tablet Take 0.5 tablets (25 mg total) by mouth at bedtime. 09/17/21   Judd Gaudier, NP  bisoprolol-hydrochlorothiazide [facility]) 5-6.25 MG tablet Take 1 tablet daily for BP 11/25/21   Revonda Humphrey, NP  Cholecalciferol (VITAMIN D-3) 5000 UNITS TABS Take 5,000 Units by mouth daily.    [provider]  cyanocobalamin (,VITAMIN B-12,) 1000 MCG/ML injection INJECT 1 MILLILITER INTO THE SKIN EVERY 30 DAYS 02/14/21   Judd Gaudier, NP  diclofenac Sodium (VOLTAREN) 1 % GEL Apply 1 application topically 4 (four) times daily.    [provider]  ferrous sulfate 325 (65 FE) MG tablet Take 325  mg by mouth daily. Takes 143mg  BID    [provider]  levothyroxine (SYNTHROID) 112 MCG tablet Take 1 tablet (112 mcg total) by mouth daily before breakfast. 04/24/21   04/26/21, NP  omeprazole (PRILOSEC) 20 MG capsule Take  1 capsule  Daily to Prevent Heartburn & Indigestion. 09/17/21   09/19/21, NP  Probiotic Product (RESTORA) CAPS TAKE 1 TABLET DAILY FOR INTESTINAL HEALTHT 10/10/21   14/8/22, NP     Allergies    Norvasc [amlodipine besylate] and Sulfa antibiotics   Review of Systems   Review of Systems Please see HPI for pertinent positives and negatives  Physical Exam BP (!) 186/76    Pulse 72    Temp 97.6 F (36.4 C)    Resp 16    Ht 5' 2.5" (1.588 m)    Wt 43.6 kg    SpO2 97%    BMI 17.30 kg/m   Physical Exam Vitals and nursing note reviewed.  Constitutional:      Appearance: Normal appearance.  HENT:     Head: Normocephalic and atraumatic.     Nose: Nose normal.     Mouth/Throat:     Mouth: Mucous membranes are dry.  Eyes:     Extraocular Movements: Extraocular movements intact.     Conjunctiva/sclera: Conjunctivae normal.  Cardiovascular:     Rate and Rhythm: Normal rate.  Pulmonary:  Effort: Pulmonary effort is normal.     Breath sounds: Normal breath sounds.  Abdominal:     General: Abdomen is flat. Bowel sounds are normal. There is no distension.     Palpations: Abdomen is soft.     Tenderness: There is no abdominal tenderness. There is no guarding.  Musculoskeletal:        General: No swelling. Normal range of motion.     Cervical back: Neck supple.  Skin:    General: Skin is warm and dry.  Neurological:     General: No focal deficit present.     Mental Status: She is alert.  Psychiatric:        Mood and Affect: Mood normal.    ED Results / Procedures / Treatments   EKG None  Procedures Procedures  Medications Ordered in the ED Medications  lactated ringers bolus 500 mL (0 mLs Intravenous Stopped 12/11/21 1748)     Initial Impression and Plan  Patient with profuse diarrhea for a few hours earlier today has since stopped. Never had any abdominal pain, fever or vomiting. Has not been on any antibiotics recently. She took one iron pill yesterday and some imodium this morning. She had labs done in triage showing CBC with mild anemia, improved from previous. CMP with mild hyponatremia and increase in Cr. Mild increase in liver enzymes also likely due to hemoconcentration. Will give LR bolus PO trial and reassess.   ED Course   Clinical Course as of 12/11/21 1900  Wed Dec 11, 2021  1858 Patient's UA is neg for infection. She has not had any further diarrhea since arrival. She is able to stand and walk without dizziness. Patient and family are comfortable going home. At this point she does not require admission. Advised to return if diarrhea worsens, if she has fever, abdominal pain or bleeding or for any other concerns.  [CS]    Clinical Course User Index [CS] Pollyann Savoy, MD     MDM Rules/Calculators/A&P Medical Decision Making Problems Addressed: Diarrhea, unspecified type: acute illness or injury that poses a threat to life or bodily functions  Amount and/or Complexity of Data Reviewed Labs: ordered. Decision-making details documented in ED Course.  Risk OTC drugs. Decision regarding hospitalization.    Final Clinical Impression(s) / ED Diagnoses Final diagnoses:  Diarrhea, unspecified type    Rx / DC Orders ED Discharge Orders     None        Pollyann Savoy, MD 12/11/21 1900

## 2021-12-16 ENCOUNTER — Ambulatory Visit (INDEPENDENT_AMBULATORY_CARE_PROVIDER_SITE_OTHER): Payer: Medicare Other | Admitting: Internal Medicine

## 2021-12-16 ENCOUNTER — Other Ambulatory Visit: Payer: Self-pay

## 2021-12-16 ENCOUNTER — Encounter: Payer: Self-pay | Admitting: Internal Medicine

## 2021-12-16 ENCOUNTER — Inpatient Hospital Stay (HOSPITAL_COMMUNITY)
Admission: EM | Admit: 2021-12-16 | Discharge: 2021-12-23 | DRG: 391 | Disposition: A | Discharge status: Skilled Nursing Facility | Payer: Medicare Other | Attending: Internal Medicine | Admitting: Internal Medicine

## 2021-12-16 ENCOUNTER — Encounter (HOSPITAL_COMMUNITY): Payer: Self-pay

## 2021-12-16 ENCOUNTER — Emergency Department (HOSPITAL_COMMUNITY): Payer: Medicare Other

## 2021-12-16 VITALS — BP 90/62 | HR 67 | Temp 97.9°F | Resp 16 | Ht 63.5 in

## 2021-12-16 DIAGNOSIS — L89611 Pressure ulcer of right heel, stage 1: Secondary | ICD-10-CM | POA: Diagnosis not present

## 2021-12-16 DIAGNOSIS — Z888 Allergy status to other drugs, medicaments and biological substances status: Secondary | ICD-10-CM

## 2021-12-16 DIAGNOSIS — Z79899 Other long term (current) drug therapy: Secondary | ICD-10-CM

## 2021-12-16 DIAGNOSIS — F419 Anxiety disorder, unspecified: Secondary | ICD-10-CM | POA: Diagnosis present

## 2021-12-16 DIAGNOSIS — Z66 Do not resuscitate: Secondary | ICD-10-CM | POA: Diagnosis present

## 2021-12-16 DIAGNOSIS — Z8249 Family history of ischemic heart disease and other diseases of the circulatory system: Secondary | ICD-10-CM

## 2021-12-16 DIAGNOSIS — E869 Volume depletion, unspecified: Secondary | ICD-10-CM | POA: Diagnosis present

## 2021-12-16 DIAGNOSIS — E43 Unspecified severe protein-calorie malnutrition: Secondary | ICD-10-CM | POA: Diagnosis present

## 2021-12-16 DIAGNOSIS — A09 Infectious gastroenteritis and colitis, unspecified: Secondary | ICD-10-CM | POA: Diagnosis not present

## 2021-12-16 DIAGNOSIS — R296 Repeated falls: Secondary | ICD-10-CM | POA: Diagnosis not present

## 2021-12-16 DIAGNOSIS — I129 Hypertensive chronic kidney disease with stage 1 through stage 4 chronic kidney disease, or unspecified chronic kidney disease: Secondary | ICD-10-CM | POA: Diagnosis present

## 2021-12-16 DIAGNOSIS — R197 Diarrhea, unspecified: Secondary | ICD-10-CM | POA: Diagnosis not present

## 2021-12-16 DIAGNOSIS — Z91048 Other nonmedicinal substance allergy status: Secondary | ICD-10-CM

## 2021-12-16 DIAGNOSIS — R279 Unspecified lack of coordination: Secondary | ICD-10-CM | POA: Diagnosis not present

## 2021-12-16 DIAGNOSIS — Z882 Allergy status to sulfonamides status: Secondary | ICD-10-CM | POA: Diagnosis not present

## 2021-12-16 DIAGNOSIS — R059 Cough, unspecified: Secondary | ICD-10-CM | POA: Diagnosis not present

## 2021-12-16 DIAGNOSIS — K838 Other specified diseases of biliary tract: Secondary | ICD-10-CM | POA: Diagnosis present

## 2021-12-16 DIAGNOSIS — K5900 Constipation, unspecified: Secondary | ICD-10-CM | POA: Diagnosis not present

## 2021-12-16 DIAGNOSIS — E860 Dehydration: Secondary | ICD-10-CM

## 2021-12-16 DIAGNOSIS — M6281 Muscle weakness (generalized): Secondary | ICD-10-CM | POA: Diagnosis not present

## 2021-12-16 DIAGNOSIS — J449 Chronic obstructive pulmonary disease, unspecified: Secondary | ICD-10-CM | POA: Diagnosis present

## 2021-12-16 DIAGNOSIS — K3189 Other diseases of stomach and duodenum: Secondary | ICD-10-CM | POA: Diagnosis not present

## 2021-12-16 DIAGNOSIS — R531 Weakness: Secondary | ICD-10-CM | POA: Diagnosis not present

## 2021-12-16 DIAGNOSIS — R1311 Dysphagia, oral phase: Secondary | ICD-10-CM | POA: Diagnosis not present

## 2021-12-16 DIAGNOSIS — D649 Anemia, unspecified: Secondary | ICD-10-CM | POA: Diagnosis present

## 2021-12-16 DIAGNOSIS — L8990 Pressure ulcer of unspecified site, unspecified stage: Secondary | ICD-10-CM | POA: Diagnosis present

## 2021-12-16 DIAGNOSIS — E785 Hyperlipidemia, unspecified: Secondary | ICD-10-CM | POA: Diagnosis present

## 2021-12-16 DIAGNOSIS — A0811 Acute gastroenteropathy due to Norwalk agent: Principal | ICD-10-CM | POA: Diagnosis present

## 2021-12-16 DIAGNOSIS — L89621 Pressure ulcer of left heel, stage 1: Secondary | ICD-10-CM | POA: Diagnosis not present

## 2021-12-16 DIAGNOSIS — Z20822 Contact with and (suspected) exposure to covid-19: Secondary | ICD-10-CM | POA: Diagnosis present

## 2021-12-16 DIAGNOSIS — E876 Hypokalemia: Secondary | ICD-10-CM | POA: Diagnosis not present

## 2021-12-16 DIAGNOSIS — Z9049 Acquired absence of other specified parts of digestive tract: Secondary | ICD-10-CM | POA: Diagnosis not present

## 2021-12-16 DIAGNOSIS — Z9181 History of falling: Secondary | ICD-10-CM | POA: Diagnosis not present

## 2021-12-16 DIAGNOSIS — M47816 Spondylosis without myelopathy or radiculopathy, lumbar region: Secondary | ICD-10-CM | POA: Diagnosis not present

## 2021-12-16 DIAGNOSIS — K219 Gastro-esophageal reflux disease without esophagitis: Secondary | ICD-10-CM | POA: Diagnosis not present

## 2021-12-16 DIAGNOSIS — R41841 Cognitive communication deficit: Secondary | ICD-10-CM | POA: Diagnosis not present

## 2021-12-16 DIAGNOSIS — R2681 Unsteadiness on feet: Secondary | ICD-10-CM | POA: Diagnosis not present

## 2021-12-16 DIAGNOSIS — E871 Hypo-osmolality and hyponatremia: Secondary | ICD-10-CM | POA: Diagnosis present

## 2021-12-16 DIAGNOSIS — Z825 Family history of asthma and other chronic lower respiratory diseases: Secondary | ICD-10-CM

## 2021-12-16 DIAGNOSIS — Z79890 Hormone replacement therapy (postmenopausal): Secondary | ICD-10-CM

## 2021-12-16 DIAGNOSIS — R7989 Other specified abnormal findings of blood chemistry: Secondary | ICD-10-CM

## 2021-12-16 DIAGNOSIS — Z681 Body mass index (BMI) 19 or less, adult: Secondary | ICD-10-CM

## 2021-12-16 DIAGNOSIS — Z9841 Cataract extraction status, right eye: Secondary | ICD-10-CM

## 2021-12-16 DIAGNOSIS — N1831 Chronic kidney disease, stage 3a: Secondary | ICD-10-CM | POA: Diagnosis present

## 2021-12-16 DIAGNOSIS — R935 Abnormal findings on diagnostic imaging of other abdominal regions, including retroperitoneum: Secondary | ICD-10-CM | POA: Diagnosis not present

## 2021-12-16 DIAGNOSIS — E039 Hypothyroidism, unspecified: Secondary | ICD-10-CM | POA: Diagnosis present

## 2021-12-16 DIAGNOSIS — Z9842 Cataract extraction status, left eye: Secondary | ICD-10-CM

## 2021-12-16 DIAGNOSIS — R7401 Elevation of levels of liver transaminase levels: Secondary | ICD-10-CM | POA: Diagnosis not present

## 2021-12-16 DIAGNOSIS — I10 Essential (primary) hypertension: Secondary | ICD-10-CM | POA: Diagnosis not present

## 2021-12-16 DIAGNOSIS — R1314 Dysphagia, pharyngoesophageal phase: Secondary | ICD-10-CM | POA: Diagnosis not present

## 2021-12-16 DIAGNOSIS — J90 Pleural effusion, not elsewhere classified: Secondary | ICD-10-CM | POA: Diagnosis not present

## 2021-12-16 DIAGNOSIS — F32A Depression, unspecified: Secondary | ICD-10-CM | POA: Diagnosis not present

## 2021-12-16 LAB — MAGNESIUM: Magnesium: 1.6 mg/dL — ABNORMAL LOW (ref 1.7–2.4)

## 2021-12-16 LAB — BASIC METABOLIC PANEL
Anion gap: 9 (ref 5–15)
BUN: 23 mg/dL (ref 8–23)
CO2: 23 mmol/L (ref 22–32)
Calcium: 9 mg/dL (ref 8.9–10.3)
Chloride: 97 mmol/L — ABNORMAL LOW (ref 98–111)
Creatinine, Ser: 1.39 mg/dL — ABNORMAL HIGH (ref 0.44–1.00)
GFR, Estimated: 36 mL/min — ABNORMAL LOW (ref >60)
Glucose, Bld: 118 mg/dL — ABNORMAL HIGH (ref 70–99)
Potassium: 4 mmol/L (ref 3.5–5.1)
Sodium: 129 mmol/L — ABNORMAL LOW (ref 135–145)

## 2021-12-16 LAB — CBC WITH DIFFERENTIAL/PLATELET
Abs Immature Granulocytes: 0.02 10*3/uL (ref 0.00–0.07)
Basophils Absolute: 0 10*3/uL (ref 0.0–0.1)
Basophils Relative: 0 %
Eosinophils Absolute: 0 10*3/uL (ref 0.0–0.5)
Eosinophils Relative: 1 %
HCT: 35.2 % — ABNORMAL LOW (ref 36.0–46.0)
Hemoglobin: 11.8 g/dL — ABNORMAL LOW (ref 12.0–15.0)
Immature Granulocytes: 0 %
Lymphocytes Relative: 25 %
Lymphs Abs: 1.3 10*3/uL (ref 0.7–4.0)
MCH: 30.4 pg (ref 26.0–34.0)
MCHC: 33.5 g/dL (ref 30.0–36.0)
MCV: 90.7 fL (ref 80.0–100.0)
Monocytes Absolute: 0.4 10*3/uL (ref 0.1–1.0)
Monocytes Relative: 8 %
Neutro Abs: 3.3 10*3/uL (ref 1.7–7.7)
Neutrophils Relative %: 66 %
Platelets: 230 10*3/uL (ref 150–400)
RBC: 3.88 MIL/uL (ref 3.87–5.11)
RDW: 13.8 % (ref 11.5–15.5)
WBC: 5 10*3/uL (ref 4.0–10.5)
nRBC: 0 % (ref 0.0–0.2)

## 2021-12-16 MED ORDER — LEVOTHYROXINE SODIUM 112 MCG PO TABS: 112.0000 ug | ORAL_TABLET | Freq: Every day | ORAL | Status: DC

## 2021-12-16 MED ORDER — AMITRIPTYLINE HCL 25 MG PO TABS
25.0000 mg | ORAL_TABLET | Freq: Every day | ORAL | Status: DC
  Administered 2021-12-16 – 2021-12-22 (×7): 25 mg via ORAL
  Filled 2021-12-16 (×8): qty 1

## 2021-12-16 MED ORDER — LEVOTHYROXINE SODIUM 112 MCG PO TABS
112.0000 ug | ORAL_TABLET | Freq: Every day | ORAL | Status: DC
  Administered 2021-12-17 – 2021-12-23 (×7): 112 ug via ORAL
  Filled 2021-12-16 (×7): qty 1

## 2021-12-16 MED ORDER — ALPRAZOLAM 0.25 MG PO TABS
0.1250 mg | ORAL_TABLET | Freq: Every evening | ORAL | Status: DC | PRN
  Administered 2021-12-16: 0.125 mg via ORAL
  Administered 2021-12-18 – 2021-12-19 (×2): 0.25 mg via ORAL
  Administered 2021-12-20 – 2021-12-21 (×2): 0.125 mg via ORAL
  Filled 2021-12-16 (×5): qty 1

## 2021-12-16 MED ORDER — SODIUM CHLORIDE 0.9 % IV BOLUS
500.0000 mL | Freq: Once | INTRAVENOUS | Status: AC
  Administered 2021-12-16: 500 mL via INTRAVENOUS

## 2021-12-16 MED ORDER — BISOPROLOL-HYDROCHLOROTHIAZIDE 5-6.25 MG PO TABS
1.0000 | ORAL_TABLET | Freq: Every day | ORAL | Status: DC
  Administered 2021-12-16: 1 via ORAL
  Filled 2021-12-16: qty 1

## 2021-12-16 NOTE — ED Provider Triage Note (Signed)
Emergency Medicine Provider Triage Evaluation Note  Shelby Bradley , a 88 y.o. female  was evaluated in triage.  Pt complains of weakness of about 1 week duration associated with diarrhea.  Patient was evaluated at droppage on 2/8 without improvement in symptoms since.  Daughter is at bedside who provides most of the history reports she has not had any improvement.  Denies dysuria, urinary frequency, abdominal pain, chest pain, shortness of breath.  Does endorse productive cough.  Baseline function prior to this illness she was independent in cooking and activities of daily living.  She has been taking Imodium with some improvement in diarrhea.  She presented to her primary care provider's office today and was found to be orthostatic and referred to the emergency room.  Review of Systems  Positive: As above Negative: As above  Physical Exam  BP (!) 142/70    Pulse 64    Temp 98.2 F (36.8 C)    Resp 12    SpO2 100%  Gen:   Awake, no distress   Resp:  Normal effort  MSK:   Moves extremities without difficulty  Other:    Medical Decision Making  Medically screening exam initiated at 6:24 PM.  Appropriate orders placed.  Shelby Bradley was informed that the remainder of the evaluation will be completed by another provider, this initial triage assessment does not replace that evaluation, and the importance of remaining in the ED until their evaluation is complete.     Evlyn Courier, PA-C 12/16/21 1825

## 2021-12-16 NOTE — Progress Notes (Signed)
Future Appointments  Date Time Provider Department  12/16/2021  4:00 PM Unk Pinto, MD [facility]  01/08/2022  2:00 PM Liane Comber, NP [facility]  09/17/2022  2:00 PM Liane Comber, NP [facility]    History of Present Illness:               This very nice, but frail 88 y.o. [place]  with HTN, HLD, Hypothyroidism,  Pre-Diabetes, Vit B12 Deficiency, GERDand Vitamin D Deficiency  who was seen in the ER 1 week ago for Diarrhea  And had labs checked & was given 1/2 "bag" of fluids and discharged to return home.  Patient;'s caretaker daughter  relates that her mother has had very poor oral intake ( especially fluids ) in the 1 week interim and has continued to have poorly formed to watery stools daily.  Today in office sitting BP was very low at 92 /60 and with great difficulty & assistance the patient was stood upright with no detectible BP and patient nearly collapsed.  Daughter denies any N/V, fever or rash. No obvious blood noted in diarrheal stools.    Medications  Current Outpatient Medications (Endocrine & Metabolic):    levothyroxine (SYNTHROID) 112 MCG tablet, Take 1 tablet (112 mcg total) by mouth daily before breakfast.  Current Outpatient Medications (Cardiovascular):    bisoprolol-hydrochlorothiazide (ZIAC) 5-6.25 MG tablet, Take 1 tablet daily for BP    Current Outpatient Medications (Hematological):    cyanocobalamin (,VITAMIN B-12,) 1000 MCG/ML injection, INJECT 1 MILLILITER INTO THE SKIN EVERY 30 DAYS   ferrous sulfate 325 (65 FE) MG tablet, Take 325 mg by mouth daily. Takes 143mg  BID  Current Outpatient Medications (Other):    ALPRAZolam (XANAX) 0.25 MG tablet, TAKE 1/2 TO 1 TABLET AT BEDTIME ONLY IF NEEDED FOR SLEEP & PLEASE TRY TO LIMIT TO 5 DAYS /WEEK TO AVOID ADDICTION & DEMENTIA (Patient taking differently: Take 1/2 to 1 tablet    at Bedtime   ONLY       if needed for Sleep & please try to limit to 5 days /week to avoid Addiction & Dementia)   amitriptyline  (ELAVIL) 50 MG tablet, Take 0.5 tablets (25 mg total) by mouth at bedtime.   Cholecalciferol (VITAMIN D-3) 5000 UNITS TABS, Take 5,000 Units by mouth daily.   diclofenac Sodium (VOLTAREN) 1 % GEL, Apply 1 application topically 4 (four) times daily.   omeprazole (PRILOSEC) 20 MG capsule, Take  1 capsule  Daily to Prevent Heartburn & Indigestion.   Probiotic Product (RESTORA) CAPS, TAKE 1 TABLET DAILY FOR INTESTINAL HEALTHT  Problem list She has Labile hypertension; Hyperlipidemia, mixed; B12 deficiency; Normochromic normocytic anemia; Gastroesophageal reflux disease; Vitamin D deficiency; Abnormal glucose; Medication management; Hypothyroidism; Macular degeneration; Lower back pain; COPD (chronic obstructive pulmonary disease) ([place]); Malnutrition of mild degree ([place]); Stage 3 chronic kidney disease ([place]); Hyperkalemia; BMI less than 19,adult; Peripheral edema; Iron deficiency; External hemorrhoids; and Elevated LFTs.   Observations/Objective:  Sitting BP 90/62    P 67    T 97.9 F    R 16    Ht 5' 3.5"     SpO2 98%    BMI 16.76   Standing BP - not detected   HEENT - WNL. O/P Clear & parched. No exudate.  Neck - supple. Car not palpable.  Chest - Clear equal BS. Cor - Nl HS. RRR w/o sig MGR.  Abd - Soft. Non-tender. BS hypoactive.  MS- Generalized severe decrease in muscle power, tone & bulk.  Neuro -  Nl w/o focal abnormalities.  Assessment and Plan:  1. Diarrhea, suspected Infectious  -  2. Dehydration  3. Weakness     - Labs & stool cultures  (GI path panel) cancelled when obvious patient would likely need hospitalization   - Patient's family directed to take patient back to the [facility] for ER anticipating admission    Follow Up Instructions:        I discussed the assessment and treatment plan with the patient's daughter & son-in law who are agreeable to transport her to ER.     Kirtland Bouchard, MD

## 2021-12-16 NOTE — ED Provider Notes (Signed)
[facility] EMERGENCY DEPARTMENT Provider Note   CSN: [id number] Arrival date & time: 12/16/21  1757     History  No chief complaint on file.   Shelby Bradley is a 88 y.o. female with history of COPD, hypertension, hyperlipidemia, and stage III kidney disease who presents to the emergency department with increased weakness and diarrhea.  Patient states that she fell on January 31, and has been feeling worse since then.  She has had diarrhea since the fall, and believes this is making her more weak.  She reports weakness and dizziness with any ambulation.  She also reports decreased appetite, and too numerous to count episodes of diarrhea.  She is not complaining of any abdominal pain, cough, chills, or fever.  She was sent from her primary doctor when they noted her systolic blood pressure to be in the 80s.  Patient believes that she took iron supplementation right before the fall, and feels as though everything has been "going downhill since".  She had an additional fall over the weekend.  With both falls there was no head trauma or loss of consciousness.  She is not on chronic anticoagulation.  No recent changes in medications, no recent antibiotic use.  No recent travel.  Patient is very independent, and lives alone.  She has been taking Imodium with some improvement in her diarrhea, but then it just returns the next day.  HPI     Home Medications Prior to Admission medications   Medication Sig Start Date End Date Taking? Authorizing Provider  ALPRAZolam (XANAX) 0.25 MG tablet TAKE 1/2 TO 1 TABLET AT BEDTIME ONLY IF NEEDED FOR SLEEP & PLEASE TRY TO LIMIT TO 5 DAYS /WEEK TO AVOID ADDICTION & DEMENTIA Patient taking differently: Take 0.125-0.25 mg by mouth at bedtime as needed for anxiety or sleep (attempt to limit to 5 days a week to avoid addiction and/or dementia). 07/29/20  Yes Unk Pinto, MD  amitriptyline (ELAVIL) 50 MG tablet Take 0.5 tablets (25 mg total)  by mouth at bedtime. 09/17/21  Yes Liane Comber, NP  bisoprolol-hydrochlorothiazide (ZIAC) 5-6.25 MG tablet Take 1 tablet daily for BP Patient taking differently: Take 1 tablet by mouth at bedtime. 11/25/21  Yes Magda Bernheim, NP  Cholecalciferol (VITAMIN D-3) 5000 UNITS TABS Take 5,000 Units by mouth 2 (two) times a week.   Yes [provider]  cyanocobalamin (,VITAMIN B-12,) 1000 MCG/ML injection INJECT 1 MILLILITER INTO THE SKIN EVERY 30 DAYS Patient taking differently: Inject 1,000 mcg into the skin every 30 (thirty) days. 02/14/21  Yes Liane Comber, NP  levothyroxine (SYNTHROID) 112 MCG tablet Take 1 tablet (112 mcg total) by mouth daily before breakfast. 04/24/21  Yes Liane Comber, NP  omeprazole (PRILOSEC) 20 MG capsule Take  1 capsule  Daily to Prevent Heartburn & Indigestion. Patient taking differently: Take 20 mg by mouth daily. 09/17/21  Yes Liane Comber, NP  Probiotic Product (RESTORA) CAPS TAKE 1 TABLET DAILY FOR INTESTINAL HEALTHT Patient taking differently: Take 1 capsule by mouth in the morning. 10/10/21  Yes Magda Bernheim, NP      Allergies    Norvasc [amlodipine besylate], Sulfa antibiotics, and Tape    Review of Systems   Review of Systems  Constitutional:  Negative for chills and fever.  Respiratory:  Negative for cough and shortness of breath.   Cardiovascular:  Negative for chest pain.  Gastrointestinal:  Positive for diarrhea. Negative for abdominal pain, blood in stool, constipation, nausea and vomiting.  Genitourinary:  Negative for decreased urine volume, dysuria, flank pain, frequency and hematuria.  Neurological:  Positive for dizziness and weakness. Negative for syncope and headaches.  All other systems reviewed and are negative.  Physical Exam Updated Vital Signs BP (!) 210/77    Pulse 62    Temp 98.2 F (36.8 C)    Resp 17    SpO2 100%  Physical Exam Vitals and nursing note reviewed.  Constitutional:      Appearance: Normal appearance.  She is underweight.  HENT:     Head: Normocephalic and atraumatic.  Eyes:     Conjunctiva/sclera: Conjunctivae normal.  Cardiovascular:     Rate and Rhythm: Normal rate and regular rhythm.  Pulmonary:     Effort: Pulmonary effort is normal. No respiratory distress.     Breath sounds: Normal breath sounds.  Abdominal:     General: There is no distension.     Palpations: Abdomen is soft.     Tenderness: There is no abdominal tenderness.  Skin:    General: Skin is warm and dry.  Neurological:     General: No focal deficit present.     Mental Status: She is alert.    ED Results / Procedures / Treatments   Labs (all labs ordered are listed, but only abnormal results are displayed) Labs Reviewed  CBC WITH DIFFERENTIAL/PLATELET - Abnormal; Notable for the following components:      Result Value   Hemoglobin 11.8 (*)    HCT 35.2 (*)    All other components within normal limits  BASIC METABOLIC PANEL - Abnormal; Notable for the following components:   Sodium 129 (*)    Chloride 97 (*)    Glucose, Bld 118 (*)    Creatinine, Ser 1.39 (*)    GFR, Estimated 36 (*)    All other components within normal limits  MAGNESIUM - Abnormal; Notable for the following components:   Magnesium 1.6 (*)    All other components within normal limits  GASTROINTESTINAL PANEL BY PCR, STOOL (REPLACES STOOL CULTURE)  C DIFFICILE QUICK SCREEN W PCR REFLEX    CULTURE, BLOOD (ROUTINE X 2)  CULTURE, BLOOD (ROUTINE X 2)  URINALYSIS, ROUTINE W REFLEX MICROSCOPIC  C-REACTIVE PROTEIN  LACTIC ACID, PLASMA    EKG None  Radiology DG Chest Portable 1 View  Result Date: 12/16/2021 CLINICAL DATA:  Productive cough EXAM: PORTABLE CHEST 1 VIEW COMPARISON:  02/14/2021, 08/13/2020, 01/19/2019 FINDINGS: Chronic coarse interstitial opacity greatest at the apices. Chronic elevation right diaphragm. No consolidation or effusion. No pneumothorax. Stable cardiomediastinal silhouette. IMPRESSION: No active disease.  Similar elevation of right diaphragm with chronic interstitial opacity at the apices. Electronically Signed   By: Donavan Foil M.D.   On: 12/16/2021 19:06    Procedures Procedures    Medications Ordered in ED Medications  ALPRAZolam Duanne Moron) tablet 0.125-0.25 mg (0.125 mg Oral Given 12/16/21 2305)  amitriptyline (ELAVIL) tablet 25 mg (25 mg Oral Given 12/16/21 2307)  bisoprolol-hydrochlorothiazide (ZIAC) 5-6.25 MG per tablet 1 tablet (1 tablet Oral Given 12/16/21 2307)  levothyroxine (SYNTHROID) tablet 112 mcg (has no administration in time range)  sodium chloride 0.9 % bolus 500 mL (0 mLs Intravenous Stopped 12/16/21 2304)    ED Course/ Medical Decision Making/ A&P                           Medical Decision Making This patient presents to the ED for concern of weakness and diarrhea, this involves an extensive  number of treatment options, and is a complaint that carries with it a high risk of complications and morbidity. The emergent differential diagnosis prior to evaluation includes, but is not limited to,  Infectious causes such as: Viral (norovirus/rotavirus); Bacterial (Campylobacter, Shigella, Salmonella, E. Coli, Yersinia, Vibrio cholerae, Clostridium difficile); Parasitic (Giardia, Cryptosporidium, Entamoeba histolytica, Cyclospora, Microsporidium); Toxin (Staphylococcus aureus, Bacillus cereus). Noninfectious causes such as: GI Bleed, Appendicitis, Mesenteric Ischemia, Diverticulitis, Adrenal Crisis, Thyroid Storm, Toxicologic exposures, Antibiotic or drug-associated, inflammatory bowel disease.   Past Medical History / Co-morbidities: COPD, hypertension, hyperlipidemia, and stage III kidney disease  Additional history: Additional history obtained from chart review. External records from outside source obtained and reviewed including prior ER visit on 2/8, I reviewed labs and imaging.  Physical Exam: Physical exam performed. The pertinent findings include: Patient is afebrile, not  tachycardic, not hypoxic, no acute distress.  Not ill-appearing. Abdomen soft, nontender nondistended.  Lab Tests: I ordered, and personally interpreted labs.  The pertinent results include: No leukocytosis, hemoglobin stable compared to prior.  Mild hyponatremia of 129, mild hypomagnesemia of 1.6, creatinine stable.  Urinalysis and stool studies pending.   Imaging Studies: I ordered imaging studies including chest x-ray. I independently visualized and interpreted imaging which showed no acute cardiopulmonary abnormalities. I agree with the radiologist interpretation.   Cardiac Monitoring:  The patient was maintained on a cardiac monitor.  My attending physician Dr. Maryan Rued viewed and interpreted the cardiac monitored which showed an underlying rhythm of: Normal sinus rhythm.   Medications: I ordered medication including IV fluids and home medications for dehydration, hypertension, anxiety. Reevaluation of the patient after these medicines showed that the patient improved. I have reviewed the patients home medicines and have made adjustments as needed.  Consultations Obtained: I requested consultation with the hospitalist Dr. Cyd Silence,  and discussed lab and imaging findings as well as pertinent plan - they recommend: Medical admission   Disposition: After consideration of the diagnostic results and the patients response to treatment, I feel that patient's requiring admission and inpatient treatment for her dehydration and persistent diarrhea. The patient appears reasonably stabilized for admission considering the current resources, flow, and capabilities available in the ED at this time, and I doubt any other [facility] requiring further screening and/or treatment in the ED prior to admission.     Final Clinical Impression(s) / ED Diagnoses Final diagnoses:  Weakness  Diarrhea of presumed infectious origin    Rx / DC Orders ED Discharge Orders     None      Portions of this report may  have been transcribed using voice recognition software. Every effort was made to ensure accuracy; however, inadvertent computerized transcription errors may be present.    Estill Cotta 12/16/21 2315    Blanchie Dessert, MD 12/19/21 2120

## 2021-12-16 NOTE — ED Triage Notes (Signed)
Patient complains of increased weakness since having fall and diarrhea first of the month. Now patient reports dizziness with any ambulation, decreased appetite and weakness patient denies pain, no cough, no chills no fever. Patient sent from MD after they found BP 80s.

## 2021-12-17 ENCOUNTER — Encounter (HOSPITAL_COMMUNITY): Payer: Self-pay | Admitting: Internal Medicine

## 2021-12-17 ENCOUNTER — Observation Stay (HOSPITAL_COMMUNITY): Payer: Medicare Other

## 2021-12-17 DIAGNOSIS — R531 Weakness: Secondary | ICD-10-CM | POA: Diagnosis not present

## 2021-12-17 DIAGNOSIS — E039 Hypothyroidism, unspecified: Secondary | ICD-10-CM | POA: Diagnosis present

## 2021-12-17 DIAGNOSIS — R197 Diarrhea, unspecified: Secondary | ICD-10-CM | POA: Diagnosis present

## 2021-12-17 DIAGNOSIS — K219 Gastro-esophageal reflux disease without esophagitis: Secondary | ICD-10-CM | POA: Diagnosis present

## 2021-12-17 DIAGNOSIS — R7989 Other specified abnormal findings of blood chemistry: Secondary | ICD-10-CM | POA: Diagnosis not present

## 2021-12-17 DIAGNOSIS — I129 Hypertensive chronic kidney disease with stage 1 through stage 4 chronic kidney disease, or unspecified chronic kidney disease: Secondary | ICD-10-CM | POA: Diagnosis present

## 2021-12-17 DIAGNOSIS — N1831 Chronic kidney disease, stage 3a: Secondary | ICD-10-CM

## 2021-12-17 DIAGNOSIS — R279 Unspecified lack of coordination: Secondary | ICD-10-CM | POA: Diagnosis not present

## 2021-12-17 DIAGNOSIS — M47816 Spondylosis without myelopathy or radiculopathy, lumbar region: Secondary | ICD-10-CM | POA: Diagnosis not present

## 2021-12-17 DIAGNOSIS — K3189 Other diseases of stomach and duodenum: Secondary | ICD-10-CM | POA: Diagnosis not present

## 2021-12-17 DIAGNOSIS — R296 Repeated falls: Secondary | ICD-10-CM | POA: Diagnosis not present

## 2021-12-17 DIAGNOSIS — J90 Pleural effusion, not elsewhere classified: Secondary | ICD-10-CM | POA: Diagnosis not present

## 2021-12-17 DIAGNOSIS — R1314 Dysphagia, pharyngoesophageal phase: Secondary | ICD-10-CM | POA: Diagnosis not present

## 2021-12-17 DIAGNOSIS — E43 Unspecified severe protein-calorie malnutrition: Secondary | ICD-10-CM | POA: Diagnosis present

## 2021-12-17 DIAGNOSIS — Z20822 Contact with and (suspected) exposure to covid-19: Secondary | ICD-10-CM | POA: Diagnosis present

## 2021-12-17 DIAGNOSIS — E871 Hypo-osmolality and hyponatremia: Secondary | ICD-10-CM

## 2021-12-17 DIAGNOSIS — D649 Anemia, unspecified: Secondary | ICD-10-CM | POA: Diagnosis present

## 2021-12-17 DIAGNOSIS — Z8249 Family history of ischemic heart disease and other diseases of the circulatory system: Secondary | ICD-10-CM | POA: Diagnosis not present

## 2021-12-17 DIAGNOSIS — Z66 Do not resuscitate: Secondary | ICD-10-CM | POA: Diagnosis present

## 2021-12-17 DIAGNOSIS — A0811 Acute gastroenteropathy due to Norwalk agent: Secondary | ICD-10-CM | POA: Diagnosis present

## 2021-12-17 DIAGNOSIS — Z9049 Acquired absence of other specified parts of digestive tract: Secondary | ICD-10-CM | POA: Diagnosis not present

## 2021-12-17 DIAGNOSIS — R2681 Unsteadiness on feet: Secondary | ICD-10-CM | POA: Diagnosis not present

## 2021-12-17 DIAGNOSIS — J449 Chronic obstructive pulmonary disease, unspecified: Secondary | ICD-10-CM | POA: Diagnosis present

## 2021-12-17 DIAGNOSIS — I10 Essential (primary) hypertension: Secondary | ICD-10-CM | POA: Diagnosis not present

## 2021-12-17 DIAGNOSIS — Z9181 History of falling: Secondary | ICD-10-CM | POA: Diagnosis not present

## 2021-12-17 DIAGNOSIS — M6281 Muscle weakness (generalized): Secondary | ICD-10-CM | POA: Diagnosis not present

## 2021-12-17 DIAGNOSIS — F419 Anxiety disorder, unspecified: Secondary | ICD-10-CM | POA: Diagnosis not present

## 2021-12-17 DIAGNOSIS — Z882 Allergy status to sulfonamides status: Secondary | ICD-10-CM | POA: Diagnosis not present

## 2021-12-17 DIAGNOSIS — E869 Volume depletion, unspecified: Secondary | ICD-10-CM | POA: Diagnosis present

## 2021-12-17 DIAGNOSIS — Z825 Family history of asthma and other chronic lower respiratory diseases: Secondary | ICD-10-CM | POA: Diagnosis not present

## 2021-12-17 DIAGNOSIS — Z681 Body mass index (BMI) 19 or less, adult: Secondary | ICD-10-CM | POA: Diagnosis not present

## 2021-12-17 DIAGNOSIS — R1311 Dysphagia, oral phase: Secondary | ICD-10-CM | POA: Diagnosis not present

## 2021-12-17 DIAGNOSIS — L8990 Pressure ulcer of unspecified site, unspecified stage: Secondary | ICD-10-CM | POA: Diagnosis present

## 2021-12-17 DIAGNOSIS — L89621 Pressure ulcer of left heel, stage 1: Secondary | ICD-10-CM | POA: Diagnosis not present

## 2021-12-17 DIAGNOSIS — K838 Other specified diseases of biliary tract: Secondary | ICD-10-CM | POA: Diagnosis present

## 2021-12-17 DIAGNOSIS — E876 Hypokalemia: Secondary | ICD-10-CM | POA: Diagnosis not present

## 2021-12-17 DIAGNOSIS — E785 Hyperlipidemia, unspecified: Secondary | ICD-10-CM | POA: Diagnosis present

## 2021-12-17 DIAGNOSIS — F32A Depression, unspecified: Secondary | ICD-10-CM | POA: Diagnosis not present

## 2021-12-17 DIAGNOSIS — R7401 Elevation of levels of liver transaminase levels: Secondary | ICD-10-CM | POA: Diagnosis not present

## 2021-12-17 DIAGNOSIS — E038 Other specified hypothyroidism: Secondary | ICD-10-CM

## 2021-12-17 DIAGNOSIS — K5900 Constipation, unspecified: Secondary | ICD-10-CM | POA: Diagnosis not present

## 2021-12-17 DIAGNOSIS — R41841 Cognitive communication deficit: Secondary | ICD-10-CM | POA: Diagnosis not present

## 2021-12-17 DIAGNOSIS — R935 Abnormal findings on diagnostic imaging of other abdominal regions, including retroperitoneum: Secondary | ICD-10-CM | POA: Diagnosis not present

## 2021-12-17 DIAGNOSIS — L89611 Pressure ulcer of right heel, stage 1: Secondary | ICD-10-CM | POA: Diagnosis not present

## 2021-12-17 LAB — URINALYSIS, ROUTINE W REFLEX MICROSCOPIC
Bacteria, UA: NONE SEEN
Bilirubin Urine: NEGATIVE
Glucose, UA: NEGATIVE mg/dL
Ketones, ur: NEGATIVE mg/dL
Nitrite: NEGATIVE
Protein, ur: NEGATIVE mg/dL
Specific Gravity, Urine: 1.006 (ref 1.005–1.030)
pH: 5 (ref 5.0–8.0)

## 2021-12-17 LAB — CBC WITH DIFFERENTIAL/PLATELET
Abs Immature Granulocytes: 0.03 10*3/uL (ref 0.00–0.07)
Basophils Absolute: 0 10*3/uL (ref 0.0–0.1)
Basophils Relative: 0 %
Eosinophils Absolute: 0.1 10*3/uL (ref 0.0–0.5)
Eosinophils Relative: 1 %
HCT: 33.3 % — ABNORMAL LOW (ref 36.0–46.0)
Hemoglobin: 11.1 g/dL — ABNORMAL LOW (ref 12.0–15.0)
Immature Granulocytes: 1 %
Lymphocytes Relative: 25 %
Lymphs Abs: 1.6 10*3/uL (ref 0.7–4.0)
MCH: 29.9 pg (ref 26.0–34.0)
MCHC: 33.3 g/dL (ref 30.0–36.0)
MCV: 89.8 fL (ref 80.0–100.0)
Monocytes Absolute: 0.5 10*3/uL (ref 0.1–1.0)
Monocytes Relative: 8 %
Neutro Abs: 4.1 10*3/uL (ref 1.7–7.7)
Neutrophils Relative %: 65 %
Platelets: 212 10*3/uL (ref 150–400)
RBC: 3.71 MIL/uL — ABNORMAL LOW (ref 3.87–5.11)
RDW: 13.7 % (ref 11.5–15.5)
WBC: 6.2 10*3/uL (ref 4.0–10.5)
nRBC: 0 % (ref 0.0–0.2)

## 2021-12-17 LAB — GASTROINTESTINAL PANEL BY PCR, STOOL (REPLACES STOOL CULTURE)

## 2021-12-17 LAB — RESP PANEL BY RT-PCR (FLU A&B, COVID) ARPGX2
Influenza A by PCR: NEGATIVE
Influenza B by PCR: NEGATIVE
SARS Coronavirus 2 by RT PCR: NEGATIVE

## 2021-12-17 LAB — COMPREHENSIVE METABOLIC PANEL
ALT: 61 U/L — ABNORMAL HIGH (ref 0–44)
AST: 61 U/L — ABNORMAL HIGH (ref 15–41)
Albumin: 3 g/dL — ABNORMAL LOW (ref 3.5–5.0)
Alkaline Phosphatase: 256 U/L — ABNORMAL HIGH (ref 38–126)
Anion gap: 10 (ref 5–15)
BUN: 23 mg/dL (ref 8–23)
CO2: 25 mmol/L (ref 22–32)
Calcium: 9.1 mg/dL (ref 8.9–10.3)
Chloride: 97 mmol/L — ABNORMAL LOW (ref 98–111)
Creatinine, Ser: 1.37 mg/dL — ABNORMAL HIGH (ref 0.44–1.00)
GFR, Estimated: 36 mL/min — ABNORMAL LOW (ref >60)
Glucose, Bld: 118 mg/dL — ABNORMAL HIGH (ref 70–99)
Potassium: 3.8 mmol/L (ref 3.5–5.1)
Sodium: 132 mmol/L — ABNORMAL LOW (ref 135–145)
Total Bilirubin: 0.6 mg/dL (ref 0.3–1.2)
Total Protein: 5.9 g/dL — ABNORMAL LOW (ref 6.5–8.1)

## 2021-12-17 LAB — LACTIC ACID, PLASMA: Lactic Acid, Venous: 1.1 mmol/L (ref 0.5–1.9)

## 2021-12-17 LAB — C DIFFICILE QUICK SCREEN W PCR REFLEX
C Diff antigen: NEGATIVE
C Diff interpretation: NOT DETECTED
C Diff toxin: NEGATIVE

## 2021-12-17 LAB — MAGNESIUM: Magnesium: 2.7 mg/dL — ABNORMAL HIGH (ref 1.7–2.4)

## 2021-12-17 MED ORDER — ONDANSETRON HCL 4 MG/2ML IJ SOLN
4.0000 mg | Freq: Four times a day (QID) | INTRAMUSCULAR | Status: DC | PRN
  Administered 2021-12-17: 4 mg via INTRAVENOUS
  Filled 2021-12-17: qty 2

## 2021-12-17 MED ORDER — HYDRALAZINE HCL 20 MG/ML IJ SOLN
10.0000 mg | Freq: Four times a day (QID) | INTRAMUSCULAR | Status: DC | PRN
  Administered 2021-12-18: 10 mg via INTRAVENOUS
  Filled 2021-12-17: qty 1

## 2021-12-17 MED ORDER — MAGNESIUM SULFATE 2 GM/50ML IV SOLN
2.0000 g | Freq: Once | INTRAVENOUS | Status: AC
  Administered 2021-12-17: 2 g via INTRAVENOUS
  Filled 2021-12-17: qty 50

## 2021-12-17 MED ORDER — LACTATED RINGERS IV SOLN: INTRAVENOUS | Status: AC

## 2021-12-17 MED ORDER — ACETAMINOPHEN 325 MG PO TABS
650.0000 mg | ORAL_TABLET | Freq: Four times a day (QID) | ORAL | Status: DC | PRN
  Administered 2021-12-19 – 2021-12-23 (×5): 650 mg via ORAL
  Filled 2021-12-17 (×5): qty 2

## 2021-12-17 MED ORDER — PANTOPRAZOLE SODIUM 40 MG IV SOLR
40.0000 mg | INTRAVENOUS | Status: DC
  Administered 2021-12-17 – 2021-12-18 (×2): 40 mg via INTRAVENOUS
  Filled 2021-12-17 (×2): qty 10

## 2021-12-17 MED ORDER — ACETAMINOPHEN 650 MG RE SUPP: 650.0000 mg | Freq: Four times a day (QID) | RECTAL | Status: DC | PRN

## 2021-12-17 MED ORDER — ENOXAPARIN SODIUM 30 MG/0.3ML IJ SOSY
30.0000 mg | PREFILLED_SYRINGE | Freq: Every day | INTRAMUSCULAR | Status: DC
  Administered 2021-12-17 – 2021-12-23 (×7): 30 mg via SUBCUTANEOUS
  Filled 2021-12-17 (×7): qty 0.3

## 2021-12-17 MED ORDER — ONDANSETRON HCL 4 MG PO TABS: 4.0000 mg | ORAL_TABLET | Freq: Four times a day (QID) | ORAL | Status: DC | PRN

## 2021-12-17 MED ORDER — PANTOPRAZOLE SODIUM 40 MG PO TBEC: 40.0000 mg | DELAYED_RELEASE_TABLET | Freq: Every day | ORAL | Status: DC

## 2021-12-17 NOTE — Assessment & Plan Note (Signed)
·   Generalized weakness likely secondary to volume depletion and poor nutritional status  Hydrating patient with intravenous isotonic fluids  Nutrition consultation  PT evaluation  Encouraging oral intake

## 2021-12-17 NOTE — ED Notes (Signed)
Pt had multiple episodes of vomiting a clear liquid, appeared to be ginger ale

## 2021-12-17 NOTE — Assessment & Plan Note (Addendum)
·   Patient suffering from over 1 week history of acute diarrhea of unclear etiology  No recent exposure to antibiotics or other prescription medications.  No recent ingestion of undercooked food or recent travel  Hydrating patient with intravenous systemic fluids  Obtaining stool studies including C. difficile testing and GI pathology stool PCR panel  Obtaining TSH  Abdomen is nontender and lactic acid is normal, bowel ischemia is unlikely  If patient clinically worsens will consider CT imaging of the abdomen and pelvis and GI consultation

## 2021-12-17 NOTE — Assessment & Plan Note (Signed)
·   Somewhat elevated LFTs and particularly elevated alkaline phosphatase of unclear etiology  Patient is status post cholecystectomy  Obtaining right upper quadrant ultrasound

## 2021-12-17 NOTE — Assessment & Plan Note (Signed)
·   Replacing with intravenous magnesium sulfate 

## 2021-12-17 NOTE — Assessment & Plan Note (Signed)
Continuing home regimen of daily PPI therapy.  

## 2021-12-17 NOTE — Assessment & Plan Note (Signed)
Strict intake and output monitoring Creatinine near baseline Minimizing nephrotoxic agents as much as possible Serial chemistries to monitor renal function and electrolytes  

## 2021-12-17 NOTE — Assessment & Plan Note (Signed)
·   Daughter reports many months of extremely poor oral intake  Evidence of poor muscle tone/muscle wasting on exam with BMI of 16.76  Nutrition consult placed  Encouraging oral intake

## 2021-12-17 NOTE — Assessment & Plan Note (Signed)
·   Continuing home regimen of Synthroid  Obtaining TSH to ensure that this is still at target considering patient's diarrhea

## 2021-12-17 NOTE — H&P (Signed)
History and Physical    Patient: Shelby Bradley MRN: [id number] DOA: 12/16/2021  Date of Service: the patient was seen and examined on 12/17/2021  Patient coming from: Home  Chief Complaint: diarrhea, weakness   HPI:   88 year old female with past medical history of hypothyroidism, chronic kidney disease stage IIIa, hypertension, gastroesophageal reflux disease presenting to [facility] emergency department complaining of weakness and diarrhea.  History is been obtained from both patient as well as family.  Patient's been experiencing diarrhea for over 1 week.  Diarrhea is occurring over half a dozen times daily and is watery in consistency.  There is no blood in the stool.  There is no fever, recent travel, recent ingestion of undercooked food, sick contact or contacts with confirmed COVID-19 infection.  Patient has not received any antibiotics as of late.  As patient's symptoms continue to persist in the days that followed patient began to develop associated generalized weakness and poor appetite.  Patient eventually presented to [place] on 2/8 with persisting symptoms.  During that evaluation patient was hydrated with intravenous isotonic fluids and discharged home.  In the days that followed patient continued to experience progressively worsening frequent watery bouts of diarrhea weakness and poor appetite.  Patient presented to see her primary care provider on 2/13 who advised that she seek medical attention in the emergency department.  Patient then presented to [facility] emergency department for evaluation.  Upon evaluation in the emergency department clinically the patient was found to be extremely weak patient was found to be hyponatremic with sodium of 129 with concurrent hypomagnesmia of 1.6. Patient was initiated on intravenous fluids with 500 cc of normal saline.  Due to patient's persisting severe weakness and inability to ambulate as well as perceived  need for patient to need continuous intravenous fluids hospitalist group has now been called to assess the patient for admission to the hospital.     Review of Systems: Review of Systems  Gastrointestinal:  Positive for diarrhea.  Neurological:  Positive for weakness.    Past Medical History:  Diagnosis Date   Acute cholecystitis 01/18/2019   Acute kidney injury (nontraumatic) (HCC)    Allergy    Anemia    Anxiety    DDD (degenerative disc disease), lumbar    Depression    GERD (gastroesophageal reflux disease)    Hyperlipidemia    Hypertension    OA (osteoarthritis) of knee    right knee   Pre-diabetes    Thyroid disease    Vitamin D deficiency     Past Surgical History:  Procedure Laterality Date   CATARACT EXTRACTION Bilateral    CHOLECYSTECTOMY N/A 01/18/2019   Procedure: LAPAROSCOPIC CHOLECYSTECTOMY WITH INTRAOPERATIVE CHOLANGIOGRAM;  Surgeon: Clovis Riley, MD;  Location: [facility] OR;  Service: General;  Laterality: N/A;   ORIF DISTAL FEMUR FRACTURE Left    Dr. Berenice Primas    Social History:  reports that she has never smoked. She has never used smokeless tobacco. She reports that she does not drink alcohol and does not use drugs.  Allergies  Allergen Reactions   Norvasc [Amlodipine Besylate] Other (See Comments)    Reaction not recalled   Sulfa Antibiotics Other (See Comments)    "Made me go out of my head"   Tape Other (See Comments)    SKIN WILL TEAR VERY EASILY!!!!!    Family History  Problem Relation Age of Onset   Hypertension Mother    COPD Father  Heart attack Brother    Heart attack Brother     Prior to Admission medications   Medication Sig Start Date End Date Taking? Authorizing Provider  ALPRAZolam (XANAX) 0.25 MG tablet TAKE 1/2 TO 1 TABLET AT BEDTIME ONLY IF NEEDED FOR SLEEP & PLEASE TRY TO LIMIT TO 5 DAYS /WEEK TO AVOID ADDICTION & DEMENTIA Patient taking differently: Take 0.125-0.25 mg by mouth at bedtime as needed for anxiety or sleep  (attempt to limit to 5 days a week to avoid addiction and/or dementia). 07/29/20  Yes Unk Pinto, MD  amitriptyline (ELAVIL) 50 MG tablet Take 0.5 tablets (25 mg total) by mouth at bedtime. 09/17/21  Yes Liane Comber, NP  bisoprolol-hydrochlorothiazide (ZIAC) 5-6.25 MG tablet Take 1 tablet daily for BP Patient taking differently: Take 1 tablet by mouth at bedtime. 11/25/21  Yes Magda Bernheim, NP  Cholecalciferol (VITAMIN D-3) 5000 UNITS TABS Take 5,000 Units by mouth 2 (two) times a week.   Yes [provider]  cyanocobalamin (,VITAMIN B-12,) 1000 MCG/ML injection INJECT 1 MILLILITER INTO THE SKIN EVERY 30 DAYS Patient taking differently: Inject 1,000 mcg into the skin every 30 (thirty) days. 02/14/21  Yes Liane Comber, NP  levothyroxine (SYNTHROID) 112 MCG tablet Take 1 tablet (112 mcg total) by mouth daily before breakfast. 04/24/21  Yes Liane Comber, NP  omeprazole (PRILOSEC) 20 MG capsule Take  1 capsule  Daily to Prevent Heartburn & Indigestion. Patient taking differently: Take 20 mg by mouth daily. 09/17/21  Yes Liane Comber, NP  Probiotic Product (RESTORA) CAPS TAKE 1 TABLET DAILY FOR INTESTINAL HEALTHT Patient taking differently: Take 1 capsule by mouth in the morning. 10/10/21  Yes Magda Bernheim, NP    Physical Exam:  Vitals:   12/17/21 0615 12/17/21 0700 12/17/21 0745 12/17/21 0933  BP: (!) 175/85 (!) 157/73 (!) 162/84   Pulse: 64 (!) 59 65   Resp: (!) 22 17 (!) 27   Temp:      SpO2: 97% 99% 100%   Weight:    43.6 kg  Height:    5' 3.5" (1.613 m)    Constitutional: Awake alert and oriented x3, no associated distress.  Patient is cachectic.  Poor Skin: no rashes, no lesions, skin turgor noted  skin turgor noted. Eyes: Pupils are equally reactive to light.  No evidence of scleral icterus or conjunctival pallor.  ENMT: Temporal muscle wasting noted. Mucous membranes noted.  Posterior pharynx clear of any exudate or lesions.   Neck: normal, supple, no masses,  no thyromegaly.  No evidence of jugular venous distension.   Respiratory: clear to auscultation bilaterally, no wheezing, no crackles. Normal respiratory effort. No accessory muscle use.  Cardiovascular: Regular rate and rhythm, no murmurs / rubs / gallops. No extremity edema. 2+ pedal pulses. No carotid bruits.  Chest:   Nontender without crepitus or deformity.   Back:   Nontender without crepitus or deformity. Abdomen: Abdomen is soft and nontender.  No evidence of intra-abdominal masses.  Positive bowel sounds noted in all quadrants.   Musculoskeletal: Extremely poor muscle tone.  No joint deformity upper and lower extremities. Good ROM, no contractures.  Neurologic: CN 2-12 grossly intact. Sensation intact.  Patient moving all 4 extremities spontaneously.  Patient is following all commands.  Patient is responsive to verbal stimuli.   Psychiatric: Patient exhibits normal mood with appropriate affect.  Patient seems to possess insight as to their current situation.    Data Reviewed:  I have personally reviewed and interpreted labs, imaging.  Significant findings are:  Hemoglobin 11.8  hematocrit 35.2.   Sodium 129.   Creatinine 1.39.   Magnesium 1.6.   Chest x-ray personally reviewed revealing no evidence of acute cardiopulmonary disease.  EKG: Personally reviewed.  Rhythm is sinus bradycardia with heart rate of 58 bpm.  No dynamic ST segment changes appreciated.    Assessment and Plan: * Acute diarrhea- (present on admission) Patient suffering from over 1 week history of acute diarrhea of unclear etiology No recent exposure to antibiotics or other prescription medications.  No recent ingestion of undercooked food or recent travel Hydrating patient with intravenous systemic fluids Obtaining stool studies including C. difficile testing and GI pathology stool PCR panel Obtaining TSH Abdomen is nontender and lactic acid is normal, bowel ischemia is unlikely If patient clinically  worsens will consider CT imaging of the abdomen and pelvis and GI consultation  Hyponatremia- (present on admission) Hyponatremia likely secondary to volume depletion Hydrating patient with intravenous isotonic fluids Monitoring sodium levels with serial chemistries  Generalized weakness Generalized weakness likely secondary to volume depletion and poor nutritional status Hydrating patient with intravenous isotonic fluids Nutrition consultation PT evaluation Encouraging oral intake  Chronic kidney disease, stage 3a ([place])- (present on admission) Strict intake and output monitoring Creatinine near baseline Minimizing nephrotoxic agents as much as possible Serial chemistries to monitor renal function and electrolytes   Hypomagnesemia- (present on admission) Replacing with intravenous magnesium sulfate  Hypothyroidism- (present on admission) Continuing home regimen of Synthroid Obtaining TSH to ensure that this is still at target considering patient's diarrhea  Protein-calorie malnutrition, severe ([place])- (present on admission) Daughter reports many months of extremely poor oral intake Evidence of poor muscle tone/muscle wasting on exam with BMI of 16.76 Nutrition consult placed Encouraging oral intake  GERD without esophagitis- (present on admission) Continuing home regimen of daily PPI therapy.   Abnormal LFTs- (present on admission) Somewhat elevated LFTs and particularly elevated alkaline phosphatase of unclear etiology Patient is status post cholecystectomy Obtaining right upper quadrant ultrasound       Code Status:  DNR  code status decision has been confirmed with: patient, daughter Family Communication: Discussed with daughter at bedside who has been updated on plan of care  Consults: none  Severity of Illness:  The appropriate patient status for this patient is OBSERVATION. Observation status is judged to be reasonable and necessary in order to provide the  required intensity of service to ensure the patient's safety. The patient's presenting symptoms, physical exam findings, and initial radiographic and laboratory data in the context of their medical condition is felt to place them at decreased risk for further clinical deterioration. Furthermore, it is anticipated that the patient will be medically stable for discharge from the hospital within 2 midnights of admission.   Author:  Vernelle Emerald MD  12/17/2021 10:04 AM

## 2021-12-17 NOTE — Assessment & Plan Note (Signed)
·   Hyponatremia likely secondary to volume depletion  Hydrating patient with intravenous isotonic fluids  Monitoring sodium levels with serial chemistries

## 2021-12-17 NOTE — Progress Notes (Signed)
Care started prior to midnight in the emergency room and patient admitted early this morning after midnight by Dr. Inda Merlin and I am in current agreement with his assessment and plan.  Additional changes to the plan of care been made currently.  The patient is an elderly 88 year old Caucasian female who lives by herself with a past medical history significant for but not limited to hypothyroidism, chronic kidney disease stage IIIa, hypertension, GERD as well as other comorbidities who presented to the emergency room with complaints of weakness and diarrhea.  History is obtained from the patient as well as family as well and she has been experiencing diarrhea for over a week.  Diarrhea occurs over half a dozen times daily and is watery consistency with no blood in it.  She had no fever or recent travel or recent ingestion of uncooked food.  She denies any sick contacts with COVID-19.  Patient states that her symptoms continue to persist in the days that followed she began to develop associated generalized weakness and a poor appetite.  She eventually presented to the [facility] on 12/11/2021 with persistent symptoms.  She was given IV fluid hydration with IV isotonic fluids and discharged home.  In the following days she progressively experienced worsening frequent and watery diarrhea with poor appetite.  She presented to her PCP and then she was referred to the ED and upon arrival to the ED she was found to be extremely weak and hyponatremic with a concurrent hypomagnesemia.  She was given IV fluid hydration and she was admitted for severe weakness and inability to ambulate as well as need for continuous IV fluid. Currently she is being treated and admitted for the following but not limited too:  Acute Diarrhea- (present on admission) Patient suffering from over 1 week history of acute diarrhea of unclear etiology No recent exposure to antibiotics or other prescription medications.  No recent ingestion of  undercooked food or recent travel Hydrating patient with intravenous systemic fluids Obtaining stool studies including C. difficile testing and GI pathology stool PCR panel; C Diff negative  Obtaining TSH and still pending Abdomen is nontender and lactic acid is normal, bowel ischemia is unlikely If GI Pathogen Panel will trial Antidiarrheals  If patient clinically worsens will consider CT imaging of the abdomen and pelvis and GI consultation   Hyponatremia- (present on admission) Hyponatremia likely secondary to volume depletion Hydrating patient with intravenous isotonic fluids; improving and sodium went from 129 is now 132 Monitoring sodium levels with serial chemistries   Generalized weakness Generalized weakness likely secondary to volume depletion and poor nutritional status Hydrating patient with intravenous isotonic fluids Nutrition consultation PT/OT evaluation Encouraging oral intake   Chronic kidney disease, stage 3a (HCC)- (present on admission) Strict intake and output monitoring Creatinine near baseline; BUN/creatinine is now 23/1.37 Minimizing nephrotoxic agents as much as possible Serial chemistries to monitor renal function and electrolytes   Hypomagnesemia- (present on admission) Replacing with intravenous magnesium sulfate and is now 2.7   Hypothyroidism- (present on admission) Continuing home regimen of Synthroid Obtaining TSH to ensure that this is still at target considering patient's diarrhea   Protein-calorie malnutrition, severe ([place])- (present on admission) Daughter reports many months of extremely poor oral intake Evidence of poor muscle tone/muscle wasting on exam with BMI of 16.76 Nutrition consult placed Encouraging oral intake   GERD without esophagitis- (present on admission) Continuing home regimen of daily PPI therapy for now given that C. difficile is negative.    Abnormal LFTs- (  present on admission) Somewhat elevated LFTs and  particularly elevated alkaline phosphatase of unclear etiology; they were elevated back on 12/11/2020 and now AST and ALT of 61 piece Patient is status post cholecystectomy Obtaining right upper quadrant ultrasound and will continue to follow and plan acute hepatitis panel as well  Normocytic Anemia -Patient's Hgb/Hct went from 11.8/35.2 -> 11.1/33.3 -Check Anemia Panel in the AM -Continue to Monitor for S/Sx of Bleeding; No overt Bleeding noted -Repeat CBC in the AM   We will continue to monitor patient's clinical response to intervention and repeat blood work in the a.m. and consider further imaging if necessary

## 2021-12-18 DIAGNOSIS — R197 Diarrhea, unspecified: Secondary | ICD-10-CM | POA: Diagnosis not present

## 2021-12-18 DIAGNOSIS — L8990 Pressure ulcer of unspecified site, unspecified stage: Secondary | ICD-10-CM | POA: Insufficient documentation

## 2021-12-18 LAB — COMPREHENSIVE METABOLIC PANEL
ALT: 47 U/L — ABNORMAL HIGH (ref 0–44)
AST: 39 U/L (ref 15–41)
Albumin: 3 g/dL — ABNORMAL LOW (ref 3.5–5.0)
Alkaline Phosphatase: 196 U/L — ABNORMAL HIGH (ref 38–126)
Anion gap: 8 (ref 5–15)
BUN: 20 mg/dL (ref 8–23)
CO2: 22 mmol/L (ref 22–32)
Calcium: 8.7 mg/dL — ABNORMAL LOW (ref 8.9–10.3)
Chloride: 100 mmol/L (ref 98–111)
Creatinine, Ser: 1.17 mg/dL — ABNORMAL HIGH (ref 0.44–1.00)
GFR, Estimated: 44 mL/min — ABNORMAL LOW (ref >60)
Glucose, Bld: 108 mg/dL — ABNORMAL HIGH (ref 70–99)
Potassium: 4.1 mmol/L (ref 3.5–5.1)
Sodium: 130 mmol/L — ABNORMAL LOW (ref 135–145)
Total Bilirubin: 0.7 mg/dL (ref 0.3–1.2)
Total Protein: 5.7 g/dL — ABNORMAL LOW (ref 6.5–8.1)

## 2021-12-18 LAB — CBC WITH DIFFERENTIAL/PLATELET
Abs Immature Granulocytes: 0.01 10*3/uL (ref 0.00–0.07)
Basophils Absolute: 0 10*3/uL (ref 0.0–0.1)
Basophils Relative: 0 %
Eosinophils Absolute: 0 10*3/uL (ref 0.0–0.5)
Eosinophils Relative: 1 %
HCT: 33.1 % — ABNORMAL LOW (ref 36.0–46.0)
Hemoglobin: 11.2 g/dL — ABNORMAL LOW (ref 12.0–15.0)
Immature Granulocytes: 0 %
Lymphocytes Relative: 19 %
Lymphs Abs: 1.1 10*3/uL (ref 0.7–4.0)
MCH: 30.5 pg (ref 26.0–34.0)
MCHC: 33.8 g/dL (ref 30.0–36.0)
MCV: 90.2 fL (ref 80.0–100.0)
Monocytes Absolute: 0.5 10*3/uL (ref 0.1–1.0)
Monocytes Relative: 8 %
Neutro Abs: 4.1 10*3/uL (ref 1.7–7.7)
Neutrophils Relative %: 72 %
Platelets: 200 10*3/uL (ref 150–400)
RBC: 3.67 MIL/uL — ABNORMAL LOW (ref 3.87–5.11)
RDW: 13.8 % (ref 11.5–15.5)
WBC: 5.7 10*3/uL (ref 4.0–10.5)
nRBC: 0 % (ref 0.0–0.2)

## 2021-12-18 LAB — MAGNESIUM: Magnesium: 1.9 mg/dL (ref 1.7–2.4)

## 2021-12-18 LAB — PHOSPHORUS: Phosphorus: 2.8 mg/dL (ref 2.5–4.6)

## 2021-12-18 LAB — TSH: TSH: 2.964 u[IU]/mL (ref 0.350–4.500)

## 2021-12-18 MED ORDER — SODIUM CHLORIDE 0.9 % IV BOLUS
500.0000 mL | Freq: Once | INTRAVENOUS | Status: AC
  Administered 2021-12-18: 500 mL via INTRAVENOUS

## 2021-12-18 MED ORDER — RISAQUAD PO CAPS
1.0000 | ORAL_CAPSULE | Freq: Every day | ORAL | Status: DC
  Administered 2021-12-18 – 2021-12-23 (×6): 1 via ORAL
  Filled 2021-12-18 (×6): qty 1

## 2021-12-18 MED ORDER — PANTOPRAZOLE SODIUM 40 MG PO TBEC
40.0000 mg | DELAYED_RELEASE_TABLET | Freq: Every day | ORAL | Status: DC
  Administered 2021-12-19 – 2021-12-23 (×5): 40 mg via ORAL
  Filled 2021-12-18 (×5): qty 1

## 2021-12-18 MED ORDER — SODIUM CHLORIDE 0.9 % IV SOLN: INTRAVENOUS | Status: DC

## 2021-12-18 MED ORDER — ENSURE ENLIVE PO LIQD
237.0000 mL | Freq: Two times a day (BID) | ORAL | Status: DC
  Administered 2021-12-18 – 2021-12-23 (×6): 237 mL via ORAL

## 2021-12-18 NOTE — Progress Notes (Signed)
Initial Nutrition Assessment  DOCUMENTATION CODES:   Severe malnutrition in context of chronic illness  INTERVENTION:  -recommend diet liberalization to regular given pt is underweight, severely malnourished, and in need of additional kcals/protein which Heart Healthy diet limits -Ensure Plus High Protein po BID, each supplement provides 350 kcal and 20 grams of protein. -MVI with minerals daily  NUTRITION DIAGNOSIS:   Severe Malnutrition related to chronic illness as evidenced by severe muscle depletion, severe fat depletion.  GOAL:   Patient will meet greater than or equal to 90% of their needs  MONITOR:   PO intake, Supplement acceptance, Labs, Weight trends, I & O's, Skin  REASON FOR ASSESSMENT:   Consult Assessment of nutrition requirement/status, Poor PO  ASSESSMENT:   Pt with PMH significant for hypothyroidism, CKD stg IIIa, HTN, GERD admitted with acute diarrhea  Pt reports persistent diarrhea x >1 week PTA and states it is occurring over 6 times per day. Denies having any blood in the stool pt found to be positive for [name] virus per MD. Pt reports diarrhea eventually led to associated weakness and poor appetite.   Weight history reviewed. Weight appears stable, though pt is underweight (BMI <18).   PO Intake: 50% x 1 recorded meal   UOP: 1x unmeasured occurrence x24 hours I/O: +1278ml since admit  Medications: risaquad, protonix, NS @ 84ml/hr  Labs: Recent Labs  Lab 12/16/21 1829 12/17/21 0426 12/18/21 0005  NA 129* 132* 130*  K 4.0 3.8 4.1  CL 97* 97* 100  CO2 23 25 22   BUN 23 23 20   CREATININE 1.39* 1.37* 1.17*  CALCIUM 9.0 9.1 8.7*  MG 1.6* 2.7* 1.9  PHOS  --   --  2.8  GLUCOSE 118* 118* 108*    NUTRITION - FOCUSED PHYSICAL EXAM:  Flowsheet Row Most Recent Value  Orbital Region Severe depletion  Upper Arm Region Severe depletion  Thoracic and Lumbar Region Severe depletion  Buccal Region Severe depletion  Temple Region Severe depletion   Clavicle Bone Region Severe depletion  Clavicle and Acromion Bone Region Severe depletion  Scapular Bone Region Severe depletion  Dorsal Hand Severe depletion  Patellar Region Severe depletion  Anterior Thigh Region Severe depletion  Posterior Calf Region Severe depletion  Edema (RD Assessment) None  Hair Reviewed  Eyes Reviewed  Mouth Reviewed  Skin Reviewed  Nails Reviewed       Diet Order:   Diet Order             Diet Heart Room service appropriate? Yes; Fluid consistency: Thin  Diet effective now                   EDUCATION NEEDS:   No education needs have been identified at this time  Skin:  Skin Assessment: Skin Integrity Issues: Skin Integrity Issues:: Stage I, Other (Comment) Stage I: L heel Other: pressure injuries (not staged) noted to ankle, vertebral colum, and mid coccyx  Last BM:  2/14  Height:   Ht Readings from Last 1 Encounters:  12/17/21 5' 3.5" (1.613 m)    Weight:   Wt Readings from Last 1 Encounters:  12/17/21 42.4 kg    BMI:  Body mass index is 16.3 kg/m.  Estimated Nutritional Needs:   Kcal:  1300-1500  Protein:  65-75 grams  Fluid:  >1.3L/d     Theone Stanley., MS, RD, LDN (she/her/hers) RD pager number and weekend/on-call pager number located in [place].

## 2021-12-18 NOTE — Progress Notes (Signed)
PROGRESS NOTE    Shelby Bradley  [id number] DOB: Jan 02, 1928 DOA: 12/16/2021 PCP: Unk Pinto, MD    Brief Narrative:  88 year old female with past medical history of hypothyroidism, chronic kidney disease stage IIIa, hypertension, gastroesophageal reflux disease presenting to [facility] emergency department complaining of weakness and diarrhea. [name] virus positive    Consultants:    Procedures:   Antimicrobials:      Subjective: Daughter at bedside, feels pt little weak. No further diarrhea  today. Ate breakfast. No GI complaints.no sob  Objective: Vitals:   12/17/21 2346 12/18/21 0211 12/18/21 0333 12/18/21 1006  BP: (!) 186/67 (!) 105/46 (!) 143/58 (!) 141/59  Pulse: 63 65 66 63  Resp: 18 20 20 16   Temp: 97.9 F (36.6 C) 97.7 F (36.5 C) 97.8 F (36.6 C) 98.1 F (36.7 C)  TempSrc: Oral Oral  Oral  SpO2: 100% 100% 100% 100%  Weight: 42.4 kg     Height:        Intake/Output Summary (Last 24 hours) at 12/18/2021 1114 Last data filed at 12/18/2021 0800 Gross per 24 hour  Intake 741.39 ml  Output --  Net 741.39 ml   Filed Weights   12/17/21 0933 12/17/21 2346  Weight: 43.6 kg 42.4 kg    Examination: Calm, NAD Cta no w/r Reg s1/s2 no gallop Soft benign +bs No edema Aaoxox3  Mood and affect appropriate in current setting     Data Reviewed: I have personally reviewed following labs and imaging studies  CBC: Recent Labs  Lab 12/11/21 1320 12/16/21 1829 12/17/21 0426 12/18/21 0005  WBC 8.2 5.0 6.2 5.7  NEUTROABS  --  3.3 4.1 4.1  HGB 11.3* 11.8* 11.1* 11.2*  HCT 34.6* 35.2* 33.3* 33.1*  MCV 90.6 90.7 89.8 90.2  PLT 195 230 212 [id number]   Basic Metabolic Panel: Recent Labs  Lab 12/11/21 1320 12/16/21 1829 12/17/21 0426 12/18/21 0005  NA 130* 129* 132* 130*  K 3.9 4.0 3.8 4.1  CL 97* 97* 97* 100  CO2 22 23 25 22   GLUCOSE 119* 118* 118* 108*  BUN 30* 23 23 20   CREATININE 1.39* 1.39* 1.37* 1.17*  CALCIUM 9.2 9.0 9.1 8.7*   MG  --  1.6* 2.7* 1.9  PHOS  --   --   --  2.8   GFR: Estimated Creatinine Clearance: 21 mL/min (A) (by C-G formula based on SCr of 1.17 mg/dL (H)). Liver Function Tests: Recent Labs  Lab 12/11/21 1320 12/17/21 0426 12/18/21 0005  AST 80* 61* 39  ALT 64* 61* 47*  ALKPHOS 85 256* 196*  BILITOT 1.1 0.6 0.7  PROT 6.8 5.9* 5.7*  ALBUMIN 3.8 3.0* 3.0*   Recent Labs  Lab 12/11/21 1320  LIPASE 74*   No results for input(s): AMMONIA in the last 168 hours. Coagulation Profile: No results for input(s): INR, PROTIME in the last 168 hours. Cardiac Enzymes: No results for input(s): CKTOTAL, CKMB, CKMBINDEX, TROPONINI in the last 168 hours. BNP (last 3 results) No results for input(s): PROBNP in the last 8760 hours. HbA1C: No results for input(s): HGBA1C in the last 72 hours. CBG: No results for input(s): GLUCAP in the last 168 hours. Lipid Profile: No results for input(s): CHOL, HDL, LDLCALC, TRIG, CHOLHDL, LDLDIRECT in the last 72 hours. Thyroid Function Tests: Recent Labs    12/18/21 0005  TSH 2.964   Anemia Panel: No results for input(s): VITAMINB12, FOLATE, FERRITIN, TIBC, IRON, RETICCTPCT in the last 72 hours. Sepsis Labs: Recent Labs  Lab 12/17/21 0157  LATICACIDVEN 1.1    Recent Results (from the past 240 hour(s))  Resp Panel by RT-PCR (Flu A&B, Covid) Nasopharyngeal Swab     Status: None   Collection Time: 12/11/21  1:20 PM   Specimen: Nasopharyngeal Swab; Nasopharyngeal(NP) swabs in vial transport medium  Result Value Ref Range Status   SARS Coronavirus 2 by RT PCR NEGATIVE NEGATIVE Final    Comment: (NOTE) SARS-CoV-2 target nucleic acids are NOT DETECTED.  The SARS-CoV-2 RNA is generally detectable in upper respiratory specimens during the acute phase of infection. The lowest concentration of SARS-CoV-2 viral copies this assay can detect is 138 copies/mL. A negative result does not preclude SARS-Cov-2 infection and should not be used as the sole basis  for treatment or other patient management decisions. A negative result may occur with  improper specimen collection/handling, submission of specimen other than nasopharyngeal swab, presence of viral mutation(s) within the areas targeted by this assay, and inadequate number of viral copies(<138 copies/mL). A negative result must be combined with clinical observations, patient history, and epidemiological information. The expected result is Negative.  Fact Sheet for Patients:  EntrepreneurPulse.com.au  Fact Sheet for Healthcare Providers:  IncredibleEmployment.be  This test is no t yet approved or cleared by the [place] FDA and  has been authorized for detection and/or diagnosis of SARS-CoV-2 by FDA under an Emergency Use Authorization (EUA). This EUA will remain  in effect (meaning this test can be used) for the duration of the COVID-19 declaration under Section 564(b)(1) of the Act, 21 U.S.C.section 360bbb-3(b)(1), unless the authorization is terminated  or revoked sooner.       Influenza A by PCR NEGATIVE NEGATIVE Final   Influenza B by PCR NEGATIVE NEGATIVE Final    Comment: (NOTE) The Xpert Xpress SARS-CoV-2/FLU/RSV plus assay is intended as an aid in the diagnosis of influenza from Nasopharyngeal swab specimens and should not be used as a sole basis for treatment. Nasal washings and aspirates are unacceptable for Xpert Xpress SARS-CoV-2/FLU/RSV testing.  Fact Sheet for Patients: EntrepreneurPulse.com.au  Fact Sheet for Healthcare Providers: IncredibleEmployment.be  This test is not yet approved or cleared by the [place] FDA and has been authorized for detection and/or diagnosis of SARS-CoV-2 by FDA under an Emergency Use Authorization (EUA). This EUA will remain in effect (meaning this test can be used) for the duration of the COVID-19 declaration under Section 564(b)(1) of the Act, 21  U.S.C. section 360bbb-3(b)(1), unless the authorization is terminated or revoked.  Performed at [facility], [address], [place], [place] [address]   Culture, blood (routine x 2)     Status: None (Preliminary result)   Collection Time: 12/17/21  1:57 AM   Specimen: BLOOD  Result Value Ref Range Status   Specimen Description BLOOD LEFT FOREARM  Final   Special Requests   Final    BOTTLES DRAWN AEROBIC AND ANAEROBIC Blood Culture adequate volume   Culture   Final    NO GROWTH 1 DAY Performed at [facility], [place] [address], [place], [place] [address]    Report Status PENDING  Incomplete  Culture, blood (routine x 2)     Status: None (Preliminary result)   Collection Time: 12/17/21  1:58 AM   Specimen: BLOOD  Result Value Ref Range Status   Specimen Description BLOOD LEFT HAND  Final   Special Requests   Final    BOTTLES DRAWN AEROBIC ONLY Blood Culture results may not be optimal due to  an inadequate volume of blood received in culture bottles   Culture   Final    NO GROWTH 1 DAY Performed at [facility], [place] [address], [place], [place] [address]    Report Status PENDING  Incomplete  Resp Panel by RT-PCR (Flu A&B, Covid) Nasopharyngeal Swab     Status: None   Collection Time: 12/17/21  2:37 AM   Specimen: Nasopharyngeal Swab; Nasopharyngeal(NP) swabs in vial transport medium  Result Value Ref Range Status   SARS Coronavirus 2 by RT PCR NEGATIVE NEGATIVE Final    Comment: (NOTE) SARS-CoV-2 target nucleic acids are NOT DETECTED.  The SARS-CoV-2 RNA is generally detectable in upper respiratory specimens during the acute phase of infection. The lowest concentration of SARS-CoV-2 viral copies this assay can detect is 138 copies/mL. A negative result does not preclude SARS-Cov-2 infection and should not be used as the sole basis for treatment or other patient management decisions. A negative result may occur with  improper specimen  collection/handling, submission of specimen other than nasopharyngeal swab, presence of viral mutation(s) within the areas targeted by this assay, and inadequate number of viral copies(<138 copies/mL). A negative result must be combined with clinical observations, patient history, and epidemiological information. The expected result is Negative.  Fact Sheet for Patients:  EntrepreneurPulse.com.au  Fact Sheet for Healthcare Providers:  IncredibleEmployment.be  This test is no t yet approved or cleared by the [place] FDA and  has been authorized for detection and/or diagnosis of SARS-CoV-2 by FDA under an Emergency Use Authorization (EUA). This EUA will remain  in effect (meaning this test can be used) for the duration of the COVID-19 declaration under Section 564(b)(1) of the Act, 21 U.S.C.section 360bbb-3(b)(1), unless the authorization is terminated  or revoked sooner.       Influenza A by PCR NEGATIVE NEGATIVE Final   Influenza B by PCR NEGATIVE NEGATIVE Final    Comment: (NOTE) The Xpert Xpress SARS-CoV-2/FLU/RSV plus assay is intended as an aid in the diagnosis of influenza from Nasopharyngeal swab specimens and should not be used as a sole basis for treatment. Nasal washings and aspirates are unacceptable for Xpert Xpress SARS-CoV-2/FLU/RSV testing.  Fact Sheet for Patients: EntrepreneurPulse.com.au  Fact Sheet for Healthcare Providers: IncredibleEmployment.be  This test is not yet approved or cleared by the [place] FDA and has been authorized for detection and/or diagnosis of SARS-CoV-2 by FDA under an Emergency Use Authorization (EUA). This EUA will remain in effect (meaning this test can be used) for the duration of the COVID-19 declaration under Section 564(b)(1) of the Act, 21 U.S.C. section 360bbb-3(b)(1), unless the authorization is terminated or revoked.  Performed at [facility], [place] [address] [place], [place] [address]   Gastrointestinal Panel by PCR , Stool     Status: Abnormal   Collection Time: 12/17/21  8:08 AM   Specimen: Stool  Result Value Ref Range Status   Campylobacter species NOT DETECTED NOT DETECTED Final   Plesimonas shigelloides NOT DETECTED NOT DETECTED Final   Salmonella species NOT DETECTED NOT DETECTED Final   Yersinia enterocolitica NOT DETECTED NOT DETECTED Final   Vibrio species NOT DETECTED NOT DETECTED Final   Vibrio cholerae NOT DETECTED NOT DETECTED Final   Enteroaggregative E coli (EAEC) NOT DETECTED NOT DETECTED Final   Enteropathogenic E coli (EPEC) NOT DETECTED NOT DETECTED Final   Enterotoxigenic E coli (ETEC) NOT DETECTED NOT DETECTED Final   Shiga like toxin producing E coli (STEC) NOT DETECTED NOT  DETECTED Final   Shigella/Enteroinvasive E coli (EIEC) NOT DETECTED NOT DETECTED Final   Cryptosporidium NOT DETECTED NOT DETECTED Final   Cyclospora cayetanensis NOT DETECTED NOT DETECTED Final   Entamoeba histolytica NOT DETECTED NOT DETECTED Final   Giardia lamblia NOT DETECTED NOT DETECTED Final   Adenovirus F40/41 NOT DETECTED NOT DETECTED Final   Astrovirus NOT DETECTED NOT DETECTED Final   Norovirus GI/GII DETECTED (A) NOT DETECTED Final    Comment: RESULT CALLED TO, READ BACK BY AND VERIFIED WITH: JAMIE BLUE 12/17/21 1546 MU    Rotavirus A NOT DETECTED NOT DETECTED Final   Sapovirus (I, II, IV, and V) NOT DETECTED NOT DETECTED Final    Comment: Performed at [facility], [address], [place], [place] [address]  C Difficile Quick Screen w PCR reflex     Status: None   Collection Time: 12/17/21  8:08 AM   Specimen: Stool  Result Value Ref Range Status   C Diff antigen NEGATIVE NEGATIVE Final   C Diff toxin NEGATIVE NEGATIVE Final   C Diff interpretation No C. difficile detected.  Final    Comment: Performed at [facility], [address], [place], [place] [address]          Radiology Studies: DG Chest Portable 1 View  Result Date: 12/16/2021 CLINICAL DATA:  Productive cough EXAM: PORTABLE CHEST 1 VIEW COMPARISON:  02/14/2021, 08/13/2020, 01/19/2019 FINDINGS: Chronic coarse interstitial opacity greatest at the apices. Chronic elevation right diaphragm. No consolidation or effusion. No pneumothorax. Stable cardiomediastinal silhouette. IMPRESSION: No active disease. Similar elevation of right diaphragm with chronic interstitial opacity at the apices. Electronically Signed   By: Jasmine Pang M.D.   On: 12/16/2021 19:06   US Abdomen Limited RUQ (LIVER/GB)  Result Date: 12/17/2021 CLINICAL DATA:  Abnormal liver function tests. Prior cholecystectomy EXAM: ULTRASOUND ABDOMEN LIMITED RIGHT UPPER QUADRANT COMPARISON:  Sonogram dated April 24, 2021 FINDINGS: Gallbladder: Status post cholecystectomy Common bile duct/biliary system: Diameter: Dilated measuring up to 9.4 mm (previously 4.9 mm). There is also dilated intrahepatic biliary ducts. Liver: No focal lesion identified. Within normal limits in parenchymal echogenicity. Portal vein is patent on color Doppler imaging with normal direction of blood flow towards the liver. Other: None. IMPRESSION: 1. Status post cholecystectomy with interval increase in diameter of the common duct now measuring up to 9 mm (previously 4.9 mm) as well as mildly dilated intrahepatic biliary ducts. 2.  Normal liver without evidence of appreciable hepatic mass. Electronically Signed   By: Larose Hires D.O.   On: 12/17/2021 11:10        Scheduled Meds:  acidophilus  1 capsule Oral Daily   amitriptyline  25 mg Oral QHS   enoxaparin (LOVENOX) injection  30 mg Subcutaneous Daily   feeding supplement  237 mL Oral BID BM   levothyroxine  112 mcg Oral Q0600   pantoprazole (PROTONIX) IV  40 mg Intravenous Q24H   Continuous Infusions:  sodium chloride      Assessment & Plan:   Principal Problem:   Acute diarrhea Active Problems:    GERD without esophagitis   Hypothyroidism   Protein-calorie malnutrition, severe (HCC)   Chronic kidney disease, stage 3a (HCC)   Hyponatremia   Hypomagnesemia   Generalized weakness   Abnormal LFTs   Pressure injury of skin   Acute Diarrhea- (present on admission) 2/2 norovirus.  C.diff negative Continue supportive care   Hyponatremia- (present on admission) Hyponatremia likely secondary to volume depletion 2/15 resume ivf Encouraged  hydration /po intake   Generalized weakness Due to vol. Depletion PT consult   Acute on Chronic kidney disease, stage 3a ([place])- (present on admission) Prerenal from diarrhea Improved with ivf   Hypomagnesemia- (present on admission) replaced   Hypothyroidism- (present on admission) TSH stable Continue synthroid   Protein-calorie malnutrition, severe ([place])- (present on admission) Evidence of poor muscle tone/muscle wasting on exam with BMI of 16.76 Nutrition consulted Encouraging oral intake   GERD without esophagitis- (present on admission) Continuing home regimen of daily PPI therapy for now given that C. difficile is negative.    Abnormal LFTs- (present on admission) Somewhat elevated LFTs and particularly elevated alkaline phosphatase of unclear etiology; they were elevated back on 12/11/2020 and now AST and ALT of 61 piece Patient is status post cholecystectomy 2/15 improving.  Ultrasound with normal liver  Normocytic Anemia H/h stable  Consult PT OT  DVT prophylaxis: Lovenox Code Status: DNR Family Communication: Daughter at bedside Disposition Plan: TBD, PT OT consulted Status is: Inpatient Remains inpatient appropriate because: IV hydration, needs PT OT evaluation                LOS: 1 day   Time spent: 35 minutes with more than 50% on [place], MD [facility] Pager [phone]  If 7PM-7AM, please contact night-coverage 12/18/2021, 11:14 AM

## 2021-12-18 NOTE — Evaluation (Signed)
Physical Therapy Evaluation Patient Details Name: Shelby Bradley MRN: [id number] DOB: 01/28/1928 Today's Date: 12/18/2021  History of Present Illness  88 y/o female presented to ED on 12/16/21 for weakness since having fall and diarrhea at first of the month as well as dizziness with ambulation and additional fall. Recently seen in ED on 2/8 for diarrhea and fall on 1/31. Found to be hyponatremic with concurrent hypomagnesmia. PMH: COPD, HTN, stage III CKD  Clinical Impression  Patient admitted with above diagnosis. Patient presents with generalized weakness, decreased activity tolerance, and impaired balance. Patient requires increased time and effort to participate requiring min guard for short ambulation with RW. Patient easily fatigued following ambulation. Patient was independent with mobility and ADLs/iADLs and living alone. Patient will benefit from skilled PT services during acute stay to address listed deficits. Recommend SNF at discharge to maximize functional independence.  Encouraged mobility to/from bathroom with nursing staff and up in chair for meals, patient and daughter verbalized understanding. With increased mobility during hospital stay, hope to progress to be able to d/c home if able.        Recommendations for follow up therapy are one component of a multi-disciplinary discharge planning process, led by the attending physician.  Recommendations may be updated based on patient status, additional functional criteria and insurance authorization.  Follow Up Recommendations Skilled nursing-short term rehab (<3 hours/day)    Assistance Recommended at Discharge Intermittent Supervision/Assistance  Patient can return home with the following  A little help with walking and/or transfers;A little help with bathing/dressing/bathroom;Assistance with cooking/housework;Assist for transportation    Equipment Recommendations None recommended by PT  Recommendations for Other Services  OT  consult    Functional Status Assessment Patient has had a recent decline in their functional status and demonstrates the ability to make significant improvements in function in a reasonable and predictable amount of time.     Precautions / Restrictions Precautions Precautions: Fall Restrictions Weight Bearing Restrictions: No      Mobility  Bed Mobility Overal bed mobility: Needs Assistance Bed Mobility: Supine to Sit, Sit to Supine     Supine to sit: Supervision Sit to supine: Min assist   General bed mobility comments: minA to bring R LE back into bed    Transfers Overall transfer level: Needs assistance Equipment used: Rolling Tonica Brasington (2 wheels) Transfers: Sit to/from Stand Sit to Stand: Min assist           General transfer comment: minA to rise and steady    Ambulation/Gait Ambulation/Gait assistance: Min guard Gait Distance (Feet): 30 Feet Assistive device: Rolling Linsey Hirota (2 wheels) Gait Pattern/deviations: Step-through pattern, Decreased stride length, Decreased step length - left Gait velocity: decreased     General Gait Details: min guard for safety. Easily fatigued with short distance. Seems to have difficulty advancing L LE at times  Stairs            Wheelchair Mobility    Modified Rankin (Stroke Patients Only)       Balance Overall balance assessment: Needs assistance Sitting-balance support: No upper extremity supported, Feet supported Sitting balance-Leahy Scale: Fair     Standing balance support: Bilateral upper extremity supported, During functional activity, Reliant on assistive device for balance Standing balance-Leahy Scale: Poor                               Pertinent Vitals/Pain Pain Assessment Pain Assessment: No/denies pain    Home Living  Family/patient expects to be discharged to:: Private residence Living Arrangements: Alone Available Help at Discharge: Family;Available PRN/intermittently Type of  Home: House Home Access: Ramped entrance       Home Layout: One level Home Equipment: Conservation officer, nature (2 wheels);Cane - single point;BSC/3in1;Tub bench;Grab bars - toilet;Grab bars - tub/shower      Prior Function Prior Level of Function : Independent/Modified Independent;History of Falls (last six months)             Mobility Comments: recently gave up driving. Reports 2 falls in past month due to weakness. ADLs Comments: able to complete ADLs and iADLs independently PTA. Daughter recently driving patient to grocery store weekly     Hand Dominance        Extremity/Trunk Assessment   Upper Extremity Assessment Upper Extremity Assessment: Generalized weakness    Lower Extremity Assessment Lower Extremity Assessment: Generalized weakness    Cervical / Trunk Assessment Cervical / Trunk Assessment: Kyphotic  Communication   Communication: HOH  Cognition Arousal/Alertness: Awake/alert Behavior During Therapy: WFL for tasks assessed/performed Overall Cognitive Status: Within Functional Limits for tasks assessed                                          General Comments      Exercises     Assessment/Plan    PT Assessment Patient needs continued PT services  PT Problem List Decreased strength;Decreased activity tolerance;Decreased balance;Decreased mobility;Cardiopulmonary status limiting activity       PT Treatment Interventions DME instruction;Gait training;Functional mobility training;Therapeutic activities;Therapeutic exercise;Balance training;Patient/family education    PT Goals (Current goals can be found in the Care Plan section)  Acute Rehab PT Goals Patient Stated Goal: to get stronger and independent again PT Goal Formulation: With patient/family Time For Goal Achievement: 01/01/22 Potential to Achieve Goals: Good    Frequency Min 3X/week     Co-evaluation               AM-PAC PT "6 Clicks" Mobility  Outcome Measure Help  needed turning from your back to your side while in a flat bed without using bedrails?: A Little Help needed moving from lying on your back to sitting on the side of a flat bed without using bedrails?: A Little Help needed moving to and from a bed to a chair (including a wheelchair)?: A Little Help needed standing up from a chair using your arms (e.g., wheelchair or bedside chair)?: A Little Help needed to walk in hospital room?: A Little Help needed climbing 3-5 steps with a railing? : A Lot 6 Click Score: 17    End of Session Equipment Utilized During Treatment: Gait belt Activity Tolerance: Patient limited by fatigue Patient left: in bed;with call bell/phone within reach;with bed alarm set;with family/visitor present Nurse Communication: Mobility status PT Visit Diagnosis: Unsteadiness on feet (R26.81);Muscle weakness (generalized) (M62.81);History of falling (Z91.81);Difficulty in walking, not elsewhere classified (R26.2)    Time: 1343-1430 PT Time Calculation (min) (ACUTE ONLY): 47 min   Charges:   PT Evaluation $PT Eval Moderate Complexity: 1 Mod PT Treatments $Therapeutic Activity: 23-37 mins        Deaven Urwin A. Gilford Rile PT, DPT Acute Rehabilitation Services Pager [phone] Office [phone]   Linna Hoff 12/18/2021, 2:45 PM

## 2021-12-19 ENCOUNTER — Inpatient Hospital Stay (HOSPITAL_COMMUNITY): Payer: Medicare Other

## 2021-12-19 DIAGNOSIS — R197 Diarrhea, unspecified: Secondary | ICD-10-CM | POA: Diagnosis not present

## 2021-12-19 LAB — SODIUM: Sodium: 132 mmol/L — ABNORMAL LOW (ref 135–145)

## 2021-12-19 MED ORDER — BISOPROLOL FUMARATE 5 MG PO TABS
5.0000 mg | ORAL_TABLET | Freq: Every day | ORAL | Status: DC
  Administered 2021-12-20 – 2021-12-23 (×4): 5 mg via ORAL
  Filled 2021-12-19 (×5): qty 1

## 2021-12-19 MED ORDER — HYDRALAZINE HCL 20 MG/ML IJ SOLN
10.0000 mg | INTRAMUSCULAR | Status: DC | PRN
  Administered 2021-12-19: 10 mg via INTRAVENOUS
  Filled 2021-12-19: qty 1

## 2021-12-19 MED ORDER — GADOBUTROL 1 MMOL/ML IV SOLN
4.0000 mL | Freq: Once | INTRAVENOUS | Status: AC | PRN
  Administered 2021-12-19: 4 mL via INTRAVENOUS

## 2021-12-19 NOTE — Progress Notes (Signed)
Inpatient Rehab Admissions Coordinator:   Per OT recommendations, pt was screened for CIR candidacy by Estill Dooms, PT, DPT.  At this time, pt does not appear to have the medical necessity to support a CIR admission.  Would recommend f/u at an alternate level of care at discretion of therapy team.   Estill Dooms, PT, DPT Admissions Coordinator [phone] 12/19/21  10:11 AM

## 2021-12-19 NOTE — Progress Notes (Signed)
Occupational Therapy Evaluation Patient Details Name: Shelby Bradley MRN: [id number] DOB: 1928-05-13 Today's Date: 12/19/2021   History of Present Illness 88 y/o female presented to ED on 12/16/21 for weakness since having fall and diarrhea at first of the month as well as dizziness with ambulation and additional fall. Recently seen in ED on 2/8 for diarrhea and fall on 1/31. Found to be hyponatremic with concurrent hypomagnesmia. PMH: COPD, HTN, stage III CKD   Clinical Impression   PTA, pt lives alone and typically Modified Independent with ADLs, household IADLs, and mobility. Pt presents now with deficits in strength, endurance, and standing balance. Pt fearful of falling and noted with slow, cautious pace with OOB activities. Pt requires Min A for UB ADLs, up to Mod A for LB ADLs and Min A for basic transfers due to deficits.Began fall prevention education and feel pt would benefit from fall alert system. Based on high PLOF and pt determination to maintain independence, believe pt would progress quickly with AIR level therapies pending family support at DC. If unable to qualify for AIR, would recommend ST SNF rehab at [place].       Recommendations for follow up therapy are one component of a multi-disciplinary discharge planning process, led by the attending physician.  Recommendations may be updated based on patient status, additional functional criteria and insurance authorization.   Follow Up Recommendations  Acute inpatient rehab (3hours/day)    Assistance Recommended at Discharge Intermittent Supervision/Assistance  Patient can return home with the following A little help with walking and/or transfers;A little help with bathing/dressing/bathroom;Assistance with cooking/housework;Assist for transportation    Functional Status Assessment  Patient has had a recent decline in their functional status and demonstrates the ability to make significant improvements in function in a reasonable and  predictable amount of time.  Equipment Recommendations  None recommended by OT    Recommendations for Other Services Rehab consult     Precautions / Restrictions Precautions Precautions: Fall Restrictions Weight Bearing Restrictions: No      Mobility Bed Mobility Overal bed mobility: Needs Assistance Bed Mobility: Supine to Sit     Supine to sit: Min assist, HOB elevated     General bed mobility comments: assist to scoot hips forward, noted difficulty with momentum    Transfers Overall transfer level: Needs assistance Equipment used: None Transfers: Sit to/from Stand, Bed to chair/wheelchair/BSC Sit to Stand: Min assist     Step pivot transfers: Min assist     General transfer comment: minA to rise and steady with mild posterior bias stepping to/from [facility]      Balance Overall balance assessment: Needs assistance Sitting-balance support: No upper extremity supported, Feet supported Sitting balance-Leahy Scale: Fair     Standing balance support: Bilateral upper extremity supported, During functional activity, Reliant on assistive device for balance Standing balance-Leahy Scale: Poor                             ADL either performed or assessed with clinical judgement   ADL Overall ADL's : Needs assistance/impaired Eating/Feeding: Independent;Sitting Eating/Feeding Details (indicate cue type and reason): able to prepare coffee sitting EOB, opening packets Grooming: Min guard;Standing   Upper Body Bathing: Minimal assistance;Sitting   Lower Body Bathing: Minimal assistance;Sit to/from stand   Upper Body Dressing : Minimal assistance;Sitting   Lower Body Dressing: Moderate assistance;Sit to/from stand Lower Body Dressing Details (indicate cue type and reason): able to cross LE to reach ankles  for sock mgmt, will need increased assist in standing for balance Toilet Transfer: Minimal assistance;Stand-pivot;BSC/3in1 Toilet Transfer Details (indicate  cue type and reason): difficulty rising without assist, slow cautious steps to [facility] with mild posterior bias Toileting- Clothing Manipulation and Hygiene: Minimal assistance;Sit to/from stand;Sitting/lateral lean Toileting - Clothing Manipulation Details (indicate cue type and reason): assist for clothing mgmt d/t balance deficits, able to perform peri care seated       General ADL Comments: Limited by balance deficits and likely fear of falling resulting in slower, cautious pace for ADLs/moblity     Vision Baseline Vision/History: 1 Wears glasses Ability to See in Adequate Light: 1 Impaired Vision Assessment?: No apparent visual deficits     Perception     Praxis      Pertinent Vitals/Pain Pain Assessment Pain Assessment: No/denies pain     Hand Dominance Right   Extremity/Trunk Assessment Upper Extremity Assessment Upper Extremity Assessment: Generalized weakness   Lower Extremity Assessment Lower Extremity Assessment: Defer to PT evaluation   Cervical / Trunk Assessment Cervical / Trunk Assessment: Kyphotic   Communication Communication Communication: HOH   Cognition Arousal/Alertness: Awake/alert Behavior During Therapy: WFL for tasks assessed/performed Overall Cognitive Status: Within Functional Limits for tasks assessed                                 General Comments: pleasant, repetition of directions likely due to [place]       Exercises     Shoulder Instructions      Home Living Family/patient expects to be discharged to:: Private residence Living Arrangements: Alone Available Help at Discharge: Family;Available PRN/intermittently Type of Home: House Home Access: Ramped entrance     Home Layout: One level     Bathroom Shower/Tub: Teacher, early years/pre: Handicapped height     Home Equipment: Conservation officer, nature (2 wheels);Cane - single point;BSC/3in1;Tub bench;Grab bars - toilet;Grab bars - tub/shower           Prior Functioning/Environment Prior Level of Function : Independent/Modified Independent;History of Falls (last six months)             Mobility Comments: recently gave up driving. Reports 2 falls in past month due to weakness. ADLs Comments: able to complete ADLs and iADLs independently PTA. Daughter recently driving patient to grocery store weekly        OT Problem List: Decreased strength;Decreased activity tolerance;Impaired balance (sitting and/or standing);Decreased knowledge of use of DME or AE;Decreased safety awareness      OT Treatment/Interventions: Self-care/ADL training;Therapeutic exercise;Energy conservation;DME and/or AE instruction;Therapeutic activities;Patient/family education;Balance training    OT Goals(Current goals can be found in the care plan section) Acute Rehab OT Goals Patient Stated Goal: reduce fall risk OT Goal Formulation: With patient Time For Goal Achievement: 01/02/22 Potential to Achieve Goals: Good  OT Frequency: Min 2X/week    Co-evaluation              AM-PAC OT "6 Clicks" Daily Activity     Outcome Measure Help from another person eating meals?: None Help from another person taking care of personal grooming?: A Little Help from another person toileting, which includes using toliet, bedpan, or urinal?: A Little Help from another person bathing (including washing, rinsing, drying)?: A Little Help from another person to put on and taking off regular upper body clothing?: A Little Help from another person to put on and taking  off regular lower body clothing?: A Lot 6 Click Score: 18   End of Session Nurse Communication: Mobility status  Activity Tolerance: Patient tolerated treatment well Patient left: in bed;with call bell/phone within reach;with bed alarm set  OT Visit Diagnosis: Unsteadiness on feet (R26.81);Other abnormalities of gait and mobility (R26.89);Muscle weakness (generalized) (M62.81)                Time:  [id number] OT Time Calculation (min): 23 min Charges:  OT General Charges $OT Visit: 1 Visit OT Evaluation $OT Eval Moderate Complexity: 1 Mod OT Treatments $Self Care/Home Management : 8-22 mins  Malachy Chamber, OTR/L Acute Rehab Services Office: [phone]   Layla Maw 12/19/2021, 7:56 AM

## 2021-12-19 NOTE — Progress Notes (Signed)
PROGRESS NOTE    Shelby Bradley  [id number] DOB: [birth year] DOA: 12/16/2021 PCP: Unk Pinto, MD    Brief Narrative:  88 year old female with past medical history of hypothyroidism, chronic kidney disease stage IIIa, hypertension, gastroesophageal reflux disease presenting to [facility] emergency department complaining of weakness and diarrhea. [name] virus positive  2/16 discussed with family about abdominal ultrasound with a dilated common bile duct.  This a.m. GI sent me a chat to consider MRI with MRCP for further evaluation.  Discussed this with the family they would like to proceed to further evaluate for this.  Consultants:    Procedures:   Antimicrobials:      Subjective: Patient sleepy, no nausea or vomiting.  No abdominal pain.  No further diarrhea.  Urinating well.  Objective: Vitals:   12/18/21 1730 12/18/21 2103 12/19/21 0449 12/19/21 0942  BP: 126/77 (!) 170/81 (!) 154/65 (!) 159/64  Pulse: 70 69 66 68  Resp: 17 16 18 16   Temp: 98 F (36.7 C) 98.4 F (36.9 C) 98.4 F (36.9 C) 98 F (36.7 C)  TempSrc:  Oral    SpO2: 98% 98% 97% 100%  Weight:      Height:        Intake/Output Summary (Last 24 hours) at 12/19/2021 1332 Last data filed at 12/19/2021 0900 Gross per 24 hour  Intake 2137.35 ml  Output 475 ml  Net 1662.35 ml   Filed Weights   12/17/21 0933 12/17/21 2346  Weight: 43.6 kg 42.4 kg    Examination: Calm, NAD Cta no w/r Reg s1/s2 no gallop Soft benign +bs No edema Awakens and responds appropriately Mood and affect appropriate in current setting     Data Reviewed: I have personally reviewed following labs and imaging studies  CBC: Recent Labs  Lab 12/16/21 1829 12/17/21 0426 12/18/21 0005  WBC 5.0 6.2 5.7  NEUTROABS 3.3 4.1 4.1  HGB 11.8* 11.1* 11.2*  HCT 35.2* 33.3* 33.1*  MCV 90.7 89.8 90.2  PLT 230 212 [id number]   Basic Metabolic Panel: Recent Labs  Lab 12/16/21 1829 12/17/21 0426 12/18/21 0005  12/19/21 0300  NA 129* 132* 130* 132*  K 4.0 3.8 4.1  --   CL 97* 97* 100  --   CO2 23 25 22   --   GLUCOSE 118* 118* 108*  --   BUN 23 23 20   --   CREATININE 1.39* 1.37* 1.17*  --   CALCIUM 9.0 9.1 8.7*  --   MG 1.6* 2.7* 1.9  --   PHOS  --   --  2.8  --    GFR: Estimated Creatinine Clearance: 21 mL/min (A) (by C-G formula based on SCr of 1.17 mg/dL (H)). Liver Function Tests: Recent Labs  Lab 12/17/21 0426 12/18/21 0005  AST 61* 39  ALT 61* 47*  ALKPHOS 256* 196*  BILITOT 0.6 0.7  PROT 5.9* 5.7*  ALBUMIN 3.0* 3.0*   No results for input(s): LIPASE, AMYLASE in the last 168 hours.  No results for input(s): AMMONIA in the last 168 hours. Coagulation Profile: No results for input(s): INR, PROTIME in the last 168 hours. Cardiac Enzymes: No results for input(s): CKTOTAL, CKMB, CKMBINDEX, TROPONINI in the last 168 hours. BNP (last 3 results) No results for input(s): PROBNP in the last 8760 hours. HbA1C: No results for input(s): HGBA1C in the last 72 hours. CBG: No results for input(s): GLUCAP in the last 168 hours. Lipid Profile: No results for input(s): CHOL, HDL, LDLCALC, TRIG, CHOLHDL, LDLDIRECT in  the last 72 hours. Thyroid Function Tests: Recent Labs    12/18/21 0005  TSH 2.964   Anemia Panel: No results for input(s): VITAMINB12, FOLATE, FERRITIN, TIBC, IRON, RETICCTPCT in the last 72 hours. Sepsis Labs: Recent Labs  Lab 12/17/21 0157  LATICACIDVEN 1.1    Recent Results (from the past 240 hour(s))  Resp Panel by RT-PCR (Flu A&B, Covid) Nasopharyngeal Swab     Status: None   Collection Time: 12/11/21  1:20 PM   Specimen: Nasopharyngeal Swab; Nasopharyngeal(NP) swabs in vial transport medium  Result Value Ref Range Status   SARS Coronavirus 2 by RT PCR NEGATIVE NEGATIVE Final    Comment: (NOTE) SARS-CoV-2 target nucleic acids are NOT DETECTED.  The SARS-CoV-2 RNA is generally detectable in upper respiratory specimens during the acute phase of infection.  The lowest concentration of SARS-CoV-2 viral copies this assay can detect is 138 copies/mL. A negative result does not preclude SARS-Cov-2 infection and should not be used as the sole basis for treatment or other patient management decisions. A negative result may occur with  improper specimen collection/handling, submission of specimen other than nasopharyngeal swab, presence of viral mutation(s) within the areas targeted by this assay, and inadequate number of viral copies(<138 copies/mL). A negative result must be combined with clinical observations, patient history, and epidemiological information. The expected result is Negative.  Fact Sheet for Patients:  EntrepreneurPulse.com.au  Fact Sheet for Healthcare Providers:  IncredibleEmployment.be  This test is no t yet approved or cleared by the [place] FDA and  has been authorized for detection and/or diagnosis of SARS-CoV-2 by FDA under an Emergency Use Authorization (EUA). This EUA will remain  in effect (meaning this test can be used) for the duration of the COVID-19 declaration under Section 564(b)(1) of the Act, 21 U.S.C.section 360bbb-3(b)(1), unless the authorization is terminated  or revoked sooner.       Influenza A by PCR NEGATIVE NEGATIVE Final   Influenza B by PCR NEGATIVE NEGATIVE Final    Comment: (NOTE) The Xpert Xpress SARS-CoV-2/FLU/RSV plus assay is intended as an aid in the diagnosis of influenza from Nasopharyngeal swab specimens and should not be used as a sole basis for treatment. Nasal washings and aspirates are unacceptable for Xpert Xpress SARS-CoV-2/FLU/RSV testing.  Fact Sheet for Patients: EntrepreneurPulse.com.au  Fact Sheet for Healthcare Providers: IncredibleEmployment.be  This test is not yet approved or cleared by the [place] FDA and has been authorized for detection and/or diagnosis of SARS-CoV-2 by FDA  under an Emergency Use Authorization (EUA). This EUA will remain in effect (meaning this test can be used) for the duration of the COVID-19 declaration under Section 564(b)(1) of the Act, 21 U.S.C. section 360bbb-3(b)(1), unless the authorization is terminated or revoked.  Performed at [facility], [address] [place], [place] [address]   Culture, blood (routine x 2)     Status: None (Preliminary result)   Collection Time: 12/17/21  1:57 AM   Specimen: BLOOD  Result Value Ref Range Status   Specimen Description BLOOD LEFT FOREARM  Final   Special Requests   Final    BOTTLES DRAWN AEROBIC AND ANAEROBIC Blood Culture adequate volume   Culture   Final    NO GROWTH 2 DAYS Performed at [facility], [place] [address], [place], [place] [address]    Report Status PENDING  Incomplete  Culture, blood (routine x 2)     Status: None (Preliminary result)   Collection Time: 12/17/21  1:58 AM  Specimen: BLOOD  Result Value Ref Range Status   Specimen Description BLOOD LEFT HAND  Final   Special Requests   Final    BOTTLES DRAWN AEROBIC ONLY Blood Culture results may not be optimal due to an inadequate volume of blood received in culture bottles   Culture   Final    NO GROWTH 2 DAYS Performed at [facility], [place] [address] [place], [place] [address]    Report Status PENDING  Incomplete  Resp Panel by RT-PCR (Flu A&B, Covid) Nasopharyngeal Swab     Status: None   Collection Time: 12/17/21  2:37 AM   Specimen: Nasopharyngeal Swab; Nasopharyngeal(NP) swabs in vial transport medium  Result Value Ref Range Status   SARS Coronavirus 2 by RT PCR NEGATIVE NEGATIVE Final    Comment: (NOTE) SARS-CoV-2 target nucleic acids are NOT DETECTED.  The SARS-CoV-2 RNA is generally detectable in upper respiratory specimens during the acute phase of infection. The lowest concentration of SARS-CoV-2 viral copies this assay can detect is 138 copies/mL. A negative result  does not preclude SARS-Cov-2 infection and should not be used as the sole basis for treatment or other patient management decisions. A negative result may occur with  improper specimen collection/handling, submission of specimen other than nasopharyngeal swab, presence of viral mutation(s) within the areas targeted by this assay, and inadequate number of viral copies(<138 copies/mL). A negative result must be combined with clinical observations, patient history, and epidemiological information. The expected result is Negative.  Fact Sheet for Patients:  EntrepreneurPulse.com.au  Fact Sheet for Healthcare Providers:  IncredibleEmployment.be  This test is no t yet approved or cleared by the [place] FDA and  has been authorized for detection and/or diagnosis of SARS-CoV-2 by FDA under an Emergency Use Authorization (EUA). This EUA will remain  in effect (meaning this test can be used) for the duration of the COVID-19 declaration under Section 564(b)(1) of the Act, 21 U.S.C.section 360bbb-3(b)(1), unless the authorization is terminated  or revoked sooner.       Influenza A by PCR NEGATIVE NEGATIVE Final   Influenza B by PCR NEGATIVE NEGATIVE Final    Comment: (NOTE) The Xpert Xpress SARS-CoV-2/FLU/RSV plus assay is intended as an aid in the diagnosis of influenza from Nasopharyngeal swab specimens and should not be used as a sole basis for treatment. Nasal washings and aspirates are unacceptable for Xpert Xpress SARS-CoV-2/FLU/RSV testing.  Fact Sheet for Patients: EntrepreneurPulse.com.au  Fact Sheet for Healthcare Providers: IncredibleEmployment.be  This test is not yet approved or cleared by the [place] FDA and has been authorized for detection and/or diagnosis of SARS-CoV-2 by FDA under an Emergency Use Authorization (EUA). This EUA will remain in effect (meaning this test can be used) for  the duration of the COVID-19 declaration under Section 564(b)(1) of the Act, 21 U.S.C. section 360bbb-3(b)(1), unless the authorization is terminated or revoked.  Performed at [facility], [place] [address] [place], [place] [address]   Gastrointestinal Panel by PCR , Stool     Status: Abnormal   Collection Time: 12/17/21  8:08 AM   Specimen: Stool  Result Value Ref Range Status   Campylobacter species NOT DETECTED NOT DETECTED Final   Plesimonas shigelloides NOT DETECTED NOT DETECTED Final   Salmonella species NOT DETECTED NOT DETECTED Final   Yersinia enterocolitica NOT DETECTED NOT DETECTED Final   Vibrio species NOT DETECTED NOT DETECTED Final   Vibrio cholerae NOT DETECTED NOT DETECTED Final   Enteroaggregative E coli (  EAEC) NOT DETECTED NOT DETECTED Final   Enteropathogenic E coli (EPEC) NOT DETECTED NOT DETECTED Final   Enterotoxigenic E coli (ETEC) NOT DETECTED NOT DETECTED Final   Shiga like toxin producing E coli (STEC) NOT DETECTED NOT DETECTED Final   Shigella/Enteroinvasive E coli (EIEC) NOT DETECTED NOT DETECTED Final   Cryptosporidium NOT DETECTED NOT DETECTED Final   Cyclospora cayetanensis NOT DETECTED NOT DETECTED Final   Entamoeba histolytica NOT DETECTED NOT DETECTED Final   Giardia lamblia NOT DETECTED NOT DETECTED Final   Adenovirus F40/41 NOT DETECTED NOT DETECTED Final   Astrovirus NOT DETECTED NOT DETECTED Final   Norovirus GI/GII DETECTED (A) NOT DETECTED Final    Comment: RESULT CALLED TO, READ BACK BY AND VERIFIED WITH: JAMIE BLUE 12/17/21 1546 MU    Rotavirus A NOT DETECTED NOT DETECTED Final   Sapovirus (I, II, IV, and V) NOT DETECTED NOT DETECTED Final    Comment: Performed at [facility], [address] [place], [place] [address]  C Difficile Quick Screen w PCR reflex     Status: None   Collection Time: 12/17/21  8:08 AM   Specimen: Stool  Result Value Ref Range Status   C Diff antigen NEGATIVE NEGATIVE Final   C Diff toxin  NEGATIVE NEGATIVE Final   C Diff interpretation No C. difficile detected.  Final    Comment: Performed at [facility], [place] [address], [place], [place] [address]         Radiology Studies: No results found.      Scheduled Meds:  acidophilus  1 capsule Oral Daily   amitriptyline  25 mg Oral QHS   bisoprolol  5 mg Oral Daily   enoxaparin (LOVENOX) injection  30 mg Subcutaneous Daily   feeding supplement  237 mL Oral BID BM   levothyroxine  112 mcg Oral Q0600   pantoprazole  40 mg Oral Daily   Continuous Infusions:  sodium chloride 75 mL/hr at 12/19/21 0548    Assessment & Plan:   Principal Problem:   Acute diarrhea Active Problems:   GERD without esophagitis   Hypothyroidism   Protein-calorie malnutrition, severe (HCC)   Chronic kidney disease, stage 3a (HCC)   Hyponatremia   Hypomagnesemia   Generalized weakness   Abnormal LFTs   Pressure injury of skin   Acute Diarrhea- (present on admission) 2/2 norovirus.  C.diff negative 2/16 diarrhea has resolved.  Continue supportive care   Hyponatremia- (present on admission) Hyponatremia likely secondary to volume depletion 2/16 sodium improving, currently on 1002 Continue hydration p.o. intake and IV fluids    Generalized weakness Due to vol. Depletion 2/16 PT recommends SNF   Acute on Chronic kidney disease, stage 3a ([place])- (present on admission) Prerenal from diarrhea 2/16 improved with IV fluids   Hypomagnesemia- (present on admission) replaced   Hypothyroidism- (present on admission) TSH stable Continue synthroid   Protein-calorie malnutrition, severe ([place])- (present on admission) Evidence of poor muscle tone/muscle wasting on exam with BMI of 16.76 Nutrition consulted Encouraging oral intake   GERD without esophagitis- (present on admission) Continuing home regimen of daily PPI therapy for now given that C. difficile is negative.    Abnormal LFTs- (present on admission) Somewhat  elevated LFTs and particularly elevated alkaline phosphatase of unclear etiology; they were elevated back on 12/11/2020 and now AST and ALT of 61 piece Patient is status post cholecystectomy 2/15 improving.  Ultrasound with normal liver Obtain MRI/MRCP for evaluation of common bile duct dilatation  Normocytic Anemia  H/h stable    DVT prophylaxis: Lovenox Code Status: DNR Family Communication: Daughter at bedside Disposition Plan: TBD, PT OT consulted Status is: Inpatient Remains inpatient appropriate because: IV hydration, needs SNF.                   LOS: 2 days   Time spent: 35 minutes with more than 50% on [place], MD [facility] Pager [phone]  If 7PM-7AM, please contact night-coverage 12/19/2021, 1:32 PM

## 2021-12-20 DIAGNOSIS — R197 Diarrhea, unspecified: Secondary | ICD-10-CM | POA: Diagnosis not present

## 2021-12-20 LAB — BASIC METABOLIC PANEL
Anion gap: 6 (ref 5–15)
BUN: 18 mg/dL (ref 8–23)
CO2: 21 mmol/L — ABNORMAL LOW (ref 22–32)
Calcium: 8.3 mg/dL — ABNORMAL LOW (ref 8.9–10.3)
Chloride: 107 mmol/L (ref 98–111)
Creatinine, Ser: 1.12 mg/dL — ABNORMAL HIGH (ref 0.44–1.00)
GFR, Estimated: 46 mL/min — ABNORMAL LOW (ref >60)
Glucose, Bld: 117 mg/dL — ABNORMAL HIGH (ref 70–99)
Potassium: 3.4 mmol/L — ABNORMAL LOW (ref 3.5–5.1)
Sodium: 134 mmol/L — ABNORMAL LOW (ref 135–145)

## 2021-12-20 MED ORDER — HYDRALAZINE HCL 10 MG PO TABS
10.0000 mg | ORAL_TABLET | Freq: Two times a day (BID) | ORAL | Status: DC
  Administered 2021-12-20: 10 mg via ORAL
  Filled 2021-12-20 (×3): qty 1

## 2021-12-20 MED ORDER — POTASSIUM CHLORIDE CRYS ER 20 MEQ PO TBCR
40.0000 meq | EXTENDED_RELEASE_TABLET | Freq: Once | ORAL | Status: DC
  Filled 2021-12-20: qty 2

## 2021-12-20 MED ORDER — POTASSIUM CHLORIDE 20 MEQ PO PACK
40.0000 meq | PACK | Freq: Once | ORAL | Status: AC
  Administered 2021-12-20: 40 meq via ORAL
  Filled 2021-12-20: qty 2

## 2021-12-20 NOTE — TOC Initial Note (Signed)
Transition of Care [facility]) - Initial/Assessment Note    Patient Details  Name: Shelby Bradley MRN: [id number] Date of Birth: Sep 28, 1928  Transition of Care [facility]) CM/SW Contact:    Milinda Antis, [place] Phone Number: 12/20/2021, 11:04 AM  Clinical Narrative:                 CSW received consult for possible SNF placement at time of discharge. CSW spoke with patient's daughter. The family expressed understanding of PT recommendation and is agreeable to SNF placement at time of discharge. The family did not have an agency preference. CSW discussed insurance authorization process and will provide Medicare SNF ratings list. Patient has received 2 COVID vaccines. CSW will send out referrals for review.  No further questions reported at this time.   Skilled Nursing Rehab Facilities-   [email or web address]   Ratings out of 5 possible   Name Address  Phone # Leon Inspection Overall  [facility] [address], [place] 5 5 2 4   [facility]  [place], [place] [phone] 4 2 5 5   [facility] [place], [place] 4 1 1 1   [place], [place] 2 2 4 4   [facility] [address], [place] 2 1 2 1   [place] [address] [phone] 3 1 4 3   [facility] [address], [place] 5 2 2 3   [facility] [address], [place] 4 1 2 1   [address] ([place]) [place], [address] [phone] 5 1 2 2   [facility] [phone] [address], [phone] 4 1 1 1   [facility] [address] [place] [phone] 4 1 2 1   [facility] ([place]) [place] [address] [place] 4 1 1 1           [place], [place] [address],  [place] 4 2 3 3   [facility] [place] [address] [place] 3 1 5 4   [facility], [place] [address], [address] [phone] 2 1 1 1   [facility] [address], [facility] [phone] 2 2 3 3           [address] (no [facility]) [place] [facility], [place] [phone] 4 5 5 5   [facility] (No Humana) [address], [place] 4 1 4 3   [place] (No UHC) [place], [place] 5 5 5 5   [facility] [address] [place] [phone] 3 3 4 4   [place] [address], [phone]  [phone] 2 2 2 2   [facility] [address] Dustin Flock [place] 3 1 3 2   [place] [address], [place] 1 1 2 1   [place] [address] 2 1 1 1   [place], [place] 5 2 4 5   [facility] [address], [place] 2 1 1 1   [facility] [address] [phone] 3 1 1 1   [facility] [place] 2 1 2 1           [facility] [place] [address], [facility] [phone] 5 3 3 4   [facility] [place], [place] 2 1 1  1  [place] (No Humana) [address], [place] 2 1 2 1   [facility] [place] [address], [place] [phone] 3 1 1 1           [facility] [place], [place] 4 1 5 4   [facility]) [place], [place] 2 1 3 2   [facility]) [place] [address], [place] 3 1 4 3   [place] [address], [place] 4 1 4 3   [address] [place], [place] 3 3 1 1   [facility]) [address] [place] [phone] 3 2 3 3      Expected Discharge Plan: [place] Barriers to Discharge: SNF Pending bed offer   Patient Goals and CMS Choice   CMS Medicare.gov Compare Post Acute Care list provided to:: Patient Represenative (must comment) Choice offered to / list presented to : Adult Children  Expected Discharge Plan and Services Expected Discharge Plan: [place] arrangements for the past 2 months: Single Family Home                                      Prior Living Arrangements/Services Living arrangements for the past 2 months: Single Family Home Lives with:: Self Patient language and need for interpreter reviewed:: Yes Do you feel safe going back to the place where you live?: Yes      Need for Family Participation in Patient Care: Yes (Comment) Care giver support system in place?: Yes (comment)   Criminal Activity/Legal Involvement Pertinent to Current Situation/Hospitalization: No - Comment as needed  Activities of Daily Living Home Assistive Devices/Equipment: Blood pressure cuff, Dentures (specify type) (Full; upper and lower) ADL Screening (condition at time of admission) Patient's cognitive ability adequate to safely complete daily activities?: Yes Is the patient deaf or have difficulty hearing?: Yes Does the patient have difficulty seeing, even when wearing glasses/contacts?: No Does the patient have difficulty concentrating, remembering, or making decisions?: No Patient able to express need for assistance with ADLs?: Yes Does the patient have difficulty dressing or bathing?: No Independently performs ADLs?: Yes (appropriate for developmental age) Does the patient have difficulty walking or climbing stairs?: No Weakness of Legs: Both Weakness of Arms/Hands: Both  Permission Sought/Granted   Permission granted to share information with : Yes, Verbal Permission Granted     Permission granted to share info w AGENCY: SNF        Emotional Assessment Appearance:: Appears stated  age   Affect (typically observed): Accepting Orientation: : Oriented to Self, Oriented to Place, Oriented to  Time, Oriented to Situation Alcohol / Substance Use: Not Applicable Psych Involvement: No (comment)  Admission diagnosis:  Diarrhea of presumed infectious origin [R19.7] Acute diarrhea [R19.7] Weakness [R53.1] Abnormal LFTs [R79.89] Patient Active Problem List   Diagnosis Date Noted   Pressure injury of skin 12/18/2021   Essential hypertension 12/17/2021   Acute diarrhea 12/17/2021   Hypomagnesemia 12/17/2021   Generalized weakness 12/17/2021   Abnormal LFTs 12/17/2021   Elevated LFTs 04/23/2021   External hemorrhoids 12/03/2020   Iron deficiency 11/28/2020   Peripheral edema 09/17/2020   BMI less than 19,adult 09/14/2020   Hyponatremia    Chronic kidney disease, stage 3a ([place]) 09/12/2019   Protein-calorie malnutrition, severe ([place]) 11/18/2016  COPD (chronic obstructive pulmonary disease) ([place]) 07/10/2016   Hypothyroidism 06/28/2015   Macular degeneration 06/28/2015   Medication management 12/05/2014   Labile hypertension    Hyperlipidemia, mixed    B12 deficiency    Normochromic normocytic anemia    GERD without esophagitis    Vitamin D deficiency    Abnormal glucose    PCP:  Unk Pinto, MD Pharmacy:   [id number] [place], [place] - [address] [place] [address] Phone: [phone] Fax: [phone]     Social Determinants of Health (SDOH) Interventions    Readmission Risk Interventions No flowsheet data found.

## 2021-12-20 NOTE — Plan of Care (Signed)
Problem: Health Behavior/Discharge Planning: Goal: Ability to manage health-related needs will improve Outcome: Completed/Met   Problem: Clinical Measurements: Goal: Will remain free from infection Outcome: Completed/Met Goal: Diagnostic test results will improve Outcome: Completed/Met Goal: Respiratory complications will improve Outcome: Completed/Met Goal: Cardiovascular complication will be avoided Outcome: Completed/Met

## 2021-12-20 NOTE — Progress Notes (Signed)
PROGRESS NOTE    Shelby Bradley  [id number] DOB: [birth year] DOA: 12/16/2021 PCP: Unk Pinto, MD    Brief Narrative:  88 year old female with past medical history of hypothyroidism, chronic kidney disease stage IIIa, hypertension, gastroesophageal reflux disease presenting to [facility] emergency department complaining of weakness and diarrhea. [name] virus positive  2/16 discussed with family about abdominal ultrasound with a dilated common bile duct.  This a.m. GI sent me a chat to consider MRI with MRCP for further evaluation.  Discussed this with the family they would like to proceed to further evaluate for this.  2/17 eating. First constipated, after mrcp had loose stool x1. Otherwise doing well  Consultants:    Procedures:   Antimicrobials:      Subjective: Pt has no abd pain, cp, sob, or any complaints  Objective: Vitals:   12/19/21 2303 12/20/21 0150 12/20/21 0505 12/20/21 1012  BP: (!) 193/64 130/64 (!) 129/54 (!) 147/63  Pulse: 63 74 72 73  Resp:  18 18 18   Temp:   (!) 97.5 F (36.4 C) 98 F (36.7 C)  TempSrc:   Oral   SpO2: 100% 100% 96% 100%  Weight:   46.6 kg   Height:        Intake/Output Summary (Last 24 hours) at 12/20/2021 1413 Last data filed at 12/20/2021 1331 Gross per 24 hour  Intake 2114.43 ml  Output 600 ml  Net 1514.43 ml   Filed Weights   12/17/21 0933 12/17/21 2346 12/20/21 0505  Weight: 43.6 kg 42.4 kg 46.6 kg    Examination: Calm, NAD Cta no w/r Reg s1/s2 no gallop Soft benign +bs No edema Aaoxox3  Mood and affect appropriate in current setting     Data Reviewed: I have personally reviewed following labs and imaging studies  CBC: Recent Labs  Lab 12/16/21 1829 12/17/21 0426 12/18/21 0005  WBC 5.0 6.2 5.7  NEUTROABS 3.3 4.1 4.1  HGB 11.8* 11.1* 11.2*  HCT 35.2* 33.3* 33.1*  MCV 90.7 89.8 90.2  PLT 230 212 [id number]   Basic Metabolic Panel: Recent Labs  Lab 12/16/21 1829 12/17/21 0426  12/18/21 0005 12/19/21 0300 12/20/21 0442  NA 129* 132* 130* 132* 134*  K 4.0 3.8 4.1  --  3.4*  CL 97* 97* 100  --  107  CO2 23 25 22   --  21*  GLUCOSE 118* 118* 108*  --  117*  BUN 23 23 20   --  18  CREATININE 1.39* 1.37* 1.17*  --  1.12*  CALCIUM 9.0 9.1 8.7*  --  8.3*  MG 1.6* 2.7* 1.9  --   --   PHOS  --   --  2.8  --   --    GFR: Estimated Creatinine Clearance: 24.1 mL/min (A) (by C-G formula based on SCr of 1.12 mg/dL (H)). Liver Function Tests: Recent Labs  Lab 12/17/21 0426 12/18/21 0005  AST 61* 39  ALT 61* 47*  ALKPHOS 256* 196*  BILITOT 0.6 0.7  PROT 5.9* 5.7*  ALBUMIN 3.0* 3.0*   No results for input(s): LIPASE, AMYLASE in the last 168 hours.  No results for input(s): AMMONIA in the last 168 hours. Coagulation Profile: No results for input(s): INR, PROTIME in the last 168 hours. Cardiac Enzymes: No results for input(s): CKTOTAL, CKMB, CKMBINDEX, TROPONINI in the last 168 hours. BNP (last 3 results) No results for input(s): PROBNP in the last 8760 hours. HbA1C: No results for input(s): HGBA1C in the last 72 hours. CBG: No results  for input(s): GLUCAP in the last 168 hours. Lipid Profile: No results for input(s): CHOL, HDL, LDLCALC, TRIG, CHOLHDL, LDLDIRECT in the last 72 hours. Thyroid Function Tests: Recent Labs    12/18/21 0005  TSH 2.964   Anemia Panel: No results for input(s): VITAMINB12, FOLATE, FERRITIN, TIBC, IRON, RETICCTPCT in the last 72 hours. Sepsis Labs: Recent Labs  Lab 12/17/21 0157  LATICACIDVEN 1.1    Recent Results (from the past 240 hour(s))  Resp Panel by RT-PCR (Flu A&B, Covid) Nasopharyngeal Swab     Status: None   Collection Time: 12/11/21  1:20 PM   Specimen: Nasopharyngeal Swab; Nasopharyngeal(NP) swabs in vial transport medium  Result Value Ref Range Status   SARS Coronavirus 2 by RT PCR NEGATIVE NEGATIVE Final    Comment: (NOTE) SARS-CoV-2 target nucleic acids are NOT DETECTED.  The SARS-CoV-2 RNA is generally  detectable in upper respiratory specimens during the acute phase of infection. The lowest concentration of SARS-CoV-2 viral copies this assay can detect is 138 copies/mL. A negative result does not preclude SARS-Cov-2 infection and should not be used as the sole basis for treatment or other patient management decisions. A negative result may occur with  improper specimen collection/handling, submission of specimen other than nasopharyngeal swab, presence of viral mutation(s) within the areas targeted by this assay, and inadequate number of viral copies(<138 copies/mL). A negative result must be combined with clinical observations, patient history, and epidemiological information. The expected result is Negative.  Fact Sheet for Patients:  EntrepreneurPulse.com.au  Fact Sheet for Healthcare Providers:  IncredibleEmployment.be  This test is no t yet approved or cleared by the [place] FDA and  has been authorized for detection and/or diagnosis of SARS-CoV-2 by FDA under an Emergency Use Authorization (EUA). This EUA will remain  in effect (meaning this test can be used) for the duration of the COVID-19 declaration under Section 564(b)(1) of the Act, 21 U.S.C.section 360bbb-3(b)(1), unless the authorization is terminated  or revoked sooner.       Influenza A by PCR NEGATIVE NEGATIVE Final   Influenza B by PCR NEGATIVE NEGATIVE Final    Comment: (NOTE) The Xpert Xpress SARS-CoV-2/FLU/RSV plus assay is intended as an aid in the diagnosis of influenza from Nasopharyngeal swab specimens and should not be used as a sole basis for treatment. Nasal washings and aspirates are unacceptable for Xpert Xpress SARS-CoV-2/FLU/RSV testing.  Fact Sheet for Patients: EntrepreneurPulse.com.au  Fact Sheet for Healthcare Providers: IncredibleEmployment.be  This test is not yet approved or cleared by the [place] FDA  and has been authorized for detection and/or diagnosis of SARS-CoV-2 by FDA under an Emergency Use Authorization (EUA). This EUA will remain in effect (meaning this test can be used) for the duration of the COVID-19 declaration under Section 564(b)(1) of the Act, 21 U.S.C. section 360bbb-3(b)(1), unless the authorization is terminated or revoked.  Performed at [facility], [address] [place], [place] [address]   Culture, blood (routine x 2)     Status: None (Preliminary result)   Collection Time: 12/17/21  1:57 AM   Specimen: BLOOD  Result Value Ref Range Status   Specimen Description BLOOD LEFT FOREARM  Final   Special Requests   Final    BOTTLES DRAWN AEROBIC AND ANAEROBIC Blood Culture adequate volume   Culture   Final    NO GROWTH 3 DAYS Performed at [facility], [place] [address], [place], [place] [address]    Report Status PENDING  Incomplete  Culture,  blood (routine x 2)     Status: None (Preliminary result)   Collection Time: 12/17/21  1:58 AM   Specimen: BLOOD  Result Value Ref Range Status   Specimen Description BLOOD LEFT HAND  Final   Special Requests   Final    BOTTLES DRAWN AEROBIC ONLY Blood Culture results may not be optimal due to an inadequate volume of blood received in culture bottles   Culture   Final    NO GROWTH 3 DAYS Performed at [facility], [place] [address], [place], [place] [address]    Report Status PENDING  Incomplete  Resp Panel by RT-PCR (Flu A&B, Covid) Nasopharyngeal Swab     Status: None   Collection Time: 12/17/21  2:37 AM   Specimen: Nasopharyngeal Swab; Nasopharyngeal(NP) swabs in vial transport medium  Result Value Ref Range Status   SARS Coronavirus 2 by RT PCR NEGATIVE NEGATIVE Final    Comment: (NOTE) SARS-CoV-2 target nucleic acids are NOT DETECTED.  The SARS-CoV-2 RNA is generally detectable in upper respiratory specimens during the acute phase of infection. The lowest concentration of  SARS-CoV-2 viral copies this assay can detect is 138 copies/mL. A negative result does not preclude SARS-Cov-2 infection and should not be used as the sole basis for treatment or other patient management decisions. A negative result may occur with  improper specimen collection/handling, submission of specimen other than nasopharyngeal swab, presence of viral mutation(s) within the areas targeted by this assay, and inadequate number of viral copies(<138 copies/mL). A negative result must be combined with clinical observations, patient history, and epidemiological information. The expected result is Negative.  Fact Sheet for Patients:  EntrepreneurPulse.com.au  Fact Sheet for Healthcare Providers:  IncredibleEmployment.be  This test is no t yet approved or cleared by the [place] FDA and  has been authorized for detection and/or diagnosis of SARS-CoV-2 by FDA under an Emergency Use Authorization (EUA). This EUA will remain  in effect (meaning this test can be used) for the duration of the COVID-19 declaration under Section 564(b)(1) of the Act, 21 U.S.C.section 360bbb-3(b)(1), unless the authorization is terminated  or revoked sooner.       Influenza A by PCR NEGATIVE NEGATIVE Final   Influenza B by PCR NEGATIVE NEGATIVE Final    Comment: (NOTE) The Xpert Xpress SARS-CoV-2/FLU/RSV plus assay is intended as an aid in the diagnosis of influenza from Nasopharyngeal swab specimens and should not be used as a sole basis for treatment. Nasal washings and aspirates are unacceptable for Xpert Xpress SARS-CoV-2/FLU/RSV testing.  Fact Sheet for Patients: EntrepreneurPulse.com.au  Fact Sheet for Healthcare Providers: IncredibleEmployment.be  This test is not yet approved or cleared by the [place] FDA and has been authorized for detection and/or diagnosis of SARS-CoV-2 by FDA under an Emergency Use  Authorization (EUA). This EUA will remain in effect (meaning this test can be used) for the duration of the COVID-19 declaration under Section 564(b)(1) of the Act, 21 U.S.C. section 360bbb-3(b)(1), unless the authorization is terminated or revoked.  Performed at [facility], [place] [address], [place], [place] [address]   Gastrointestinal Panel by PCR , Stool     Status: Abnormal   Collection Time: 12/17/21  8:08 AM   Specimen: Stool  Result Value Ref Range Status   Campylobacter species NOT DETECTED NOT DETECTED Final   Plesimonas shigelloides NOT DETECTED NOT DETECTED Final   Salmonella species NOT DETECTED NOT DETECTED Final   Yersinia enterocolitica NOT DETECTED NOT DETECTED Final  Vibrio species NOT DETECTED NOT DETECTED Final   Vibrio cholerae NOT DETECTED NOT DETECTED Final   Enteroaggregative E coli (EAEC) NOT DETECTED NOT DETECTED Final   Enteropathogenic E coli (EPEC) NOT DETECTED NOT DETECTED Final   Enterotoxigenic E coli (ETEC) NOT DETECTED NOT DETECTED Final   Shiga like toxin producing E coli (STEC) NOT DETECTED NOT DETECTED Final   Shigella/Enteroinvasive E coli (EIEC) NOT DETECTED NOT DETECTED Final   Cryptosporidium NOT DETECTED NOT DETECTED Final   Cyclospora cayetanensis NOT DETECTED NOT DETECTED Final   Entamoeba histolytica NOT DETECTED NOT DETECTED Final   Giardia lamblia NOT DETECTED NOT DETECTED Final   Adenovirus F40/41 NOT DETECTED NOT DETECTED Final   Astrovirus NOT DETECTED NOT DETECTED Final   Norovirus GI/GII DETECTED (A) NOT DETECTED Final    Comment: RESULT CALLED TO, READ BACK BY AND VERIFIED WITH: JAMIE BLUE 12/17/21 1546 MU    Rotavirus A NOT DETECTED NOT DETECTED Final   Sapovirus (I, II, IV, and V) NOT DETECTED NOT DETECTED Final    Comment: Performed at [facility], [address] [place], [place] [address]  C Difficile Quick Screen w PCR reflex     Status: None   Collection Time: 12/17/21  8:08 AM   Specimen: Stool   Result Value Ref Range Status   C Diff antigen NEGATIVE NEGATIVE Final   C Diff toxin NEGATIVE NEGATIVE Final   C Diff interpretation No C. difficile detected.  Final    Comment: Performed at [facility], [place] [address] [place], [place] [address]         Radiology Studies: MR ABDOMEN MRCP W WO CONTAST  Result Date: 12/19/2021 CLINICAL DATA:  Dilated common duct on ultrasound EXAM: MRI ABDOMEN WITHOUT AND WITH CONTRAST (INCLUDING MRCP) TECHNIQUE: Multiplanar multisequence MR imaging of the abdomen was performed both before and after the administration of intravenous contrast. Heavily T2-weighted images of the biliary and pancreatic ducts were obtained, and three-dimensional MRCP images were rendered by post processing. CONTRAST:  67mL GADAVIST GADOBUTROL 1 MMOL/ML IV SOLN COMPARISON:  Right upper quadrant ultrasound dated 12/17/2021. CT abdomen pelvis dated 01/17/2019. FINDINGS: Motion degraded images. Lower chest: Small bilateral pleural effusions, right greater than left. Hepatobiliary: Liver is within normal limits. No suspicious/enhancing hepatic lesions. Status post cholecystectomy. No intrahepatic ductal dilatation. Common duct measures 8 mm, within the upper limits of normal. No choledocholithiasis is seen. Pancreas:  Motion degraded but grossly unremarkable. Spleen:  Within normal limits. Adrenals/Urinary Tract:  Adrenal glands are within normal limits. Kidneys are within normal limits.  No hydronephrosis. Stomach/Bowel: Mild wall thickening involving the proximal stomach (series 4/image 17), without associated mass. Visualized bowel is grossly unremarkable. Vascular/Lymphatic:  No evidence of abdominal aortic aneurysm. No suspicious abdominal lymphadenopathy. Other:  No abdominal ascites. Musculoskeletal: Degenerative changes of the lumbar spine. IMPRESSION: Motion degraded images. Status post cholecystectomy. No intrahepatic or extrahepatic ductal dilatation. Common duct measures 8  mm, the upper limits of normal. No choledocholithiasis is seen. Small bilateral pleural effusions, right greater than left. Electronically Signed   By: Julian Hy M.D.   On: 12/19/2021 23:20        Scheduled Meds:  acidophilus  1 capsule Oral Daily   amitriptyline  25 mg Oral QHS   bisoprolol  5 mg Oral Daily   enoxaparin (LOVENOX) injection  30 mg Subcutaneous Daily   feeding supplement  237 mL Oral BID BM   levothyroxine  112 mcg Oral Q0600   pantoprazole  40  mg Oral Daily   Continuous Infusions:  sodium chloride 75 mL/hr at 12/19/21 1800    Assessment & Plan:   Principal Problem:   Acute diarrhea Active Problems:   GERD without esophagitis   Hypothyroidism   Protein-calorie malnutrition, severe (HCC)   Chronic kidney disease, stage 3a (HCC)   Hyponatremia   Hypomagnesemia   Generalized weakness   Abnormal LFTs   Pressure injury of skin   Acute Diarrhea- (present on admission) 2/2 norovirus.  C.diff negative 2/17 doing better.  Continue supportive care   Hypokalemia Will replace   Hyponatremia- (present on admission) Hyponatremia likely secondary to volume depletion 2/17 improving with ivf. Continue po hydration Decrease ivf to 42ml/hr     Generalized weakness Due to vol. Depletion 2/17 PT recommends SNF     Acute on Chronic kidney disease, stage 3a ([place])- (present on admission) Prerenal from diarrhea 2/17 improved with IV fluids   Hypomagnesemia- (present on admission) replaced   Hypothyroidism- (present on admission) TSH stable Continue synthroid   Protein-calorie malnutrition, severe ([place])- (present on admission) Evidence of poor muscle tone/muscle wasting on exam with BMI of 16.76 Nutrition consulted Encouraging oral intake   GERD without esophagitis- (present on admission) Continuing home regimen of daily PPI therapy for now given that C. difficile is negative.    Abnormal LFTs- (present on admission) Somewhat elevated LFTs  and particularly elevated alkaline phosphatase of unclear etiology; they were elevated back on 12/11/2020 and now AST and ALT of 61 piece Patient is status post cholecystectomy 2/15 improving.  Ultrasound with normal liver Obtain MRI/MRCP for evaluation of common bile duct dilatation MRCP negative  Normocytic Anemia H/h stable    DVT prophylaxis: Lovenox Code Status: DNR Family Communication: Daughter at bedside Disposition Plan: SNF Status is: Inpatient Remains inpatient appropriate  because of unsafe discharge. SNF pending                 LOS: 3 days   Time spent: 35 minutes with more than 50% on [place], MD [facility] Pager [phone]  If 7PM-7AM, please contact night-coverage 12/20/2021, 2:13 PM

## 2021-12-20 NOTE — NC FL2 (Signed)
[place] LEVEL OF CARE SCREENING TOOL     IDENTIFICATION  Patient Name: Shelby Bradley Birthdate: 1928-04-13 Sex: female Admission Date (Current Location): 12/16/2021  [facility] [place] and [place] Number:  Herbalist and Address:  [facility] [place], [place] [address] [place], [place] [address]      Provider Number: [id number]  Attending Physician Name and Address:  Nolberto Hanlon, MD  Relative Name and Phone Number:  Kyra Searles (Daughter)   [phone]    Current Level of Care: Hospital Recommended Level of Care: [place] Prior Approval Number:    Date Approved/Denied:   PASRR Number: [id number]  Discharge Plan: SNF    Current Diagnoses: Patient Active Problem List   Diagnosis Date Noted   Pressure injury of skin 12/18/2021   Essential hypertension 12/17/2021   Acute diarrhea 12/17/2021   Hypomagnesemia 12/17/2021   Generalized weakness 12/17/2021   Abnormal LFTs 12/17/2021   Elevated LFTs 04/23/2021   External hemorrhoids 12/03/2020   Iron deficiency 11/28/2020   Peripheral edema 09/17/2020   BMI less than 19,adult 09/14/2020   Hyponatremia    Chronic kidney disease, stage 3a ([place]) 09/12/2019   Protein-calorie malnutrition, severe ([place]) 11/18/2016   COPD (chronic obstructive pulmonary disease) ([place]) 07/10/2016   Hypothyroidism 06/28/2015   Macular degeneration 06/28/2015   Medication management 12/05/2014   Labile hypertension    Hyperlipidemia, mixed    B12 deficiency    Normochromic normocytic anemia    GERD without esophagitis    Vitamin D deficiency    Abnormal glucose     Orientation RESPIRATION BLADDER Height & Weight     Self, Time, Situation, Place  Normal Incontinent, External catheter Weight: 102 lb 11.8 oz (46.6 kg) Height:   (pt does not know)  BEHAVIORAL SYMPTOMS/MOOD NEUROLOGICAL BOWEL NUTRITION STATUS      Continent Diet (see d/c summary)  AMBULATORY STATUS COMMUNICATION OF NEEDS Skin    Limited Assist Verbally PU Stage and Appropriate Care                       Personal Care Assistance Level of Assistance  Dressing, Feeding   Feeding assistance: Limited assistance Dressing Assistance: Limited assistance     Functional Limitations Info  Sight, Hearing, Speech Sight Info: Impaired Hearing Info: Impaired Speech Info: Adequate    SPECIAL CARE FACTORS FREQUENCY  PT (By licensed PT), OT (By licensed OT)     PT Frequency: 5x/ week OT Frequency: 5x/ week            Contractures Contractures Info: Not present    Additional Factors Info  Code Status, Isolation Precautions, Allergies Code Status Info: Full Allergies Info: Norvasc (Amlodipine Besylate)   Sulfa Antibiotics   Tape     Isolation Precautions Info: Enteric precautions     Current Medications (12/20/2021):  This is the current hospital active medication list Current Facility-Administered Medications  Medication Dose Route Frequency Provider Last Rate Last Admin   0.9 %  sodium chloride infusion   Intravenous Continuous Nolberto Hanlon, MD 75 mL/hr at 12/19/21 1800 New Bag at 12/19/21 1800   acetaminophen (TYLENOL) tablet 650 mg  650 mg Oral Q6H PRN Vernelle Emerald, MD   650 mg at 12/20/21 0207   Or   acetaminophen (TYLENOL) suppository 650 mg  650 mg Rectal Q6H PRN Shalhoub, Sherryll Burger, MD       acidophilus (RISAQUAD) capsule 1 capsule  1 capsule Oral Daily Nolberto Hanlon, MD  1 capsule at 12/20/21 0918   ALPRAZolam (XANAX) tablet 0.125-0.25 mg  0.125-0.25 mg Oral QHS PRN Vernelle Emerald, MD   0.25 mg at 12/19/21 2000   amitriptyline (ELAVIL) tablet 25 mg  25 mg Oral QHS Vernelle Emerald, MD   25 mg at 12/19/21 2222   bisoprolol (ZEBETA) tablet 5 mg  5 mg Oral Daily Nolberto Hanlon, MD   5 mg at 12/20/21 0918   enoxaparin (LOVENOX) injection 30 mg  30 mg Subcutaneous Daily Shalhoub, Sherryll Burger, MD   30 mg at 12/20/21 0918   feeding supplement (ENSURE ENLIVE / ENSURE PLUS) liquid 237 mL  237 mL  Oral BID BM Nolberto Hanlon, MD   237 mL at 12/19/21 1113   hydrALAZINE (APRESOLINE) injection 10-20 mg  10-20 mg Intravenous Q4H PRN Etta Quill, DO   10 mg at 12/19/21 2257   levothyroxine (SYNTHROID) tablet 112 mcg  112 mcg Oral Q0600 Vernelle Emerald, MD   112 mcg at 12/20/21 0609   ondansetron (ZOFRAN) tablet 4 mg  4 mg Oral Q6H PRN Vernelle Emerald, MD       Or   ondansetron [facility]) injection 4 mg  4 mg Intravenous Q6H PRN Shalhoub, Sherryll Burger, MD   4 mg at 12/17/21 0802   pantoprazole (PROTONIX) EC tablet 40 mg  40 mg Oral Daily Nolberto Hanlon, MD   40 mg at 12/20/21 [id number]   potassium chloride SA (KLOR-CON M) CR tablet 40 mEq  40 mEq Oral Once Nolberto Hanlon, MD         Discharge Medications: Please see discharge summary for a list of discharge medications.  Relevant Imaging Results:  Relevant Lab Results:   Additional Information SSN:  [id number];   Pfizer COVID-19 Vaccine 01/31/2020 , 01/01/2020;       5'3"  102lbs  Milinda Antis, LCSWA

## 2021-12-20 NOTE — Progress Notes (Signed)
Physical Therapy Treatment Patient Details Name: Shelby Bradley MRN: [id number] DOB: 07/15/1928 Today's Date: 12/20/2021   History of Present Illness 88 y/o female presented to ED on 12/16/21 for weakness since having fall and diarrhea at first of the month as well as dizziness with ambulation and additional fall. Recently seen in ED on 2/8 for diarrhea and fall on 1/31. Found to be hyponatremic with concurrent hypomagnesmia. PMH: COPD, HTN, stage III CKD    PT Comments    Patient fatigued on arrival following episode of "choking" on a pill. Patient agreeable to therapy session. Patient performed seated exercises focused on LE strengthening. Patient transferred to [facility] with minA and RW then performed short ambulation distance in room with min guard. Continue to recommend SNF for ongoing Physical Therapy.       Recommendations for follow up therapy are one component of a multi-disciplinary discharge planning process, led by the attending physician.  Recommendations may be updated based on patient status, additional functional criteria and insurance authorization.  Follow Up Recommendations  Skilled nursing-short term rehab (<3 hours/day)     Assistance Recommended at Discharge Intermittent Supervision/Assistance  Patient can return home with the following A little help with walking and/or transfers;A little help with bathing/dressing/bathroom;Assistance with cooking/housework;Assist for transportation   Equipment Recommendations  None recommended by PT    Recommendations for Other Services       Precautions / Restrictions Precautions Precautions: Fall Restrictions Weight Bearing Restrictions: No     Mobility  Bed Mobility Overal bed mobility: Needs Assistance Bed Mobility: Supine to Sit, Sit to Supine     Supine to sit: Min assist, HOB elevated Sit to supine: Min assist   General bed mobility comments: assist for bringing LEs towards EOB and back into bed     Transfers Overall transfer level: Needs assistance Equipment used: Rolling Jasun Gasparini (2 wheels) Transfers: Sit to/from Stand, Bed to chair/wheelchair/BSC Sit to Stand: Min assist   Step pivot transfers: Min assist       General transfer comment: minA to rise and steady with mild posterior bias stepping to/from [facility]    Ambulation/Gait Ambulation/Gait assistance: Min guard Gait Distance (Feet): 20 Feet Assistive device: Rolling Mickayla Trouten (2 wheels) Gait Pattern/deviations: Step-through pattern, Decreased stride length, Decreased step length - left Gait velocity: decreased     General Gait Details: min guard for safety. Easily fatigued with short distance   Stairs             Wheelchair Mobility    Modified Rankin (Stroke Patients Only)       Balance Overall balance assessment: Needs assistance Sitting-balance support: No upper extremity supported, Feet supported Sitting balance-Leahy Scale: Fair     Standing balance support: Bilateral upper extremity supported, During functional activity, Reliant on assistive device for balance Standing balance-Leahy Scale: Poor                              Cognition Arousal/Alertness: Awake/alert Behavior During Therapy: WFL for tasks assessed/performed Overall Cognitive Status: Within Functional Limits for tasks assessed                                          Exercises General Exercises - Lower Extremity Long Arc Quad: Both, 10 reps, Seated Hip ABduction/ADduction: Both, 10 reps, Seated Hip Flexion/Marching: Both, 10 reps, Seated  General Comments        Pertinent Vitals/Pain Pain Assessment Pain Assessment: Faces Faces Pain Scale: No hurt Pain Intervention(s): Monitored during session    Home Living                          Prior Function            PT Goals (current goals can now be found in the care plan section) Acute Rehab PT Goals Patient Stated Goal: to get  stronger and independent again PT Goal Formulation: With patient/family Time For Goal Achievement: 01/01/22 Potential to Achieve Goals: Good Progress towards PT goals: Progressing toward goals    Frequency    Min 3X/week      PT Plan Current plan remains appropriate    Co-evaluation              AM-PAC PT "6 Clicks" Mobility   Outcome Measure  Help needed turning from your back to your side while in a flat bed without using bedrails?: A Little Help needed moving from lying on your back to sitting on the side of a flat bed without using bedrails?: A Little Help needed moving to and from a bed to a chair (including a wheelchair)?: A Little Help needed standing up from a chair using your arms (e.g., wheelchair or bedside chair)?: A Little Help needed to walk in hospital room?: A Little Help needed climbing 3-5 steps with a railing? : A Lot 6 Click Score: 17    End of Session   Activity Tolerance: Patient limited by fatigue Patient left: in bed;with call bell/phone within reach;with bed alarm set;with family/visitor present Nurse Communication: Mobility status PT Visit Diagnosis: Unsteadiness on feet (R26.81);Muscle weakness (generalized) (M62.81);History of falling (Z91.81);Difficulty in walking, not elsewhere classified (R26.2)     Time: [id number] PT Time Calculation (min) (ACUTE ONLY): 27 min  Charges:  $Gait Training: 8-22 mins $Therapeutic Exercise: 8-22 mins                     Danzel Marszalek A. Gilford Rile PT, DPT Acute Rehabilitation Services Pager [phone] Office [phone]    Linna Hoff 12/20/2021, 3:18 PM

## 2021-12-20 NOTE — Plan of Care (Signed)
Problem: Education: °Goal: Knowledge of General Education information will improve °Description: Including pain rating scale, medication(s)/side effects and non-pharmacologic comfort measures °Outcome: Completed/Met °  °

## 2021-12-21 DIAGNOSIS — R197 Diarrhea, unspecified: Secondary | ICD-10-CM | POA: Diagnosis not present

## 2021-12-21 MED ORDER — HYDRALAZINE HCL 10 MG PO TABS
10.0000 mg | ORAL_TABLET | Freq: Three times a day (TID) | ORAL | Status: DC
  Administered 2021-12-21 – 2021-12-22 (×3): 10 mg via ORAL
  Filled 2021-12-21 (×3): qty 1

## 2021-12-21 MED ORDER — HYDRALAZINE HCL 20 MG/ML IJ SOLN
10.0000 mg | Freq: Four times a day (QID) | INTRAMUSCULAR | Status: DC | PRN
  Administered 2021-12-21: 10 mg via INTRAVENOUS
  Filled 2021-12-21: qty 1

## 2021-12-21 NOTE — Plan of Care (Signed)
Problem: Coping: Goal: Level of anxiety will decrease Outcome: Completed/Met   Problem: Elimination: Goal: Will not experience complications related to bowel motility Outcome: Completed/Met Goal: Will not experience complications related to urinary retention Outcome: Completed/Met   

## 2021-12-21 NOTE — TOC Progression Note (Signed)
Transition of Care [facility]) - Progression Note    Patient Details  Name: Shelby Bradley MRN: [id number] Date of Birth: [birth year]  Transition of Care [facility]) CM/SW Contact  [name], [place] Phone Number: 12/21/2021, 1:38 PM  Clinical Narrative:   CSW spoke with daughter, Shelby Bradley, via phone to provide bed offers and discuss choice for SNF. Shelby Bradley asking about [facility], as that would be the first preference. [facility] referral is still pending. CSW sent a message to [facility] for them to review, awaiting response. CSW spoke with Shelby Bradley again about other choices, and Shelby Bradley asking about [facility]. CSW explained that wasn't a rehab place, and provided bed offers again. Shelby Bradley to review with her sister in law and contact CSW back. CSW to follow.    Expected Discharge Plan: Skilled Nursing Facility Barriers to Discharge: SNF Pending bed offer  Expected Discharge Plan and Services Expected Discharge Plan: Skilled Nursing Facility       Living arrangements for the past 2 months: Single Family Home                                       Social Determinants of Health (SDOH) Interventions    Readmission Risk Interventions No flowsheet data found.

## 2021-12-21 NOTE — Progress Notes (Signed)
PROGRESS NOTE    Shelby Bradley  [id number] DOB: 04-11-1928 DOA: 12/16/2021 PCP: Unk Pinto, MD    Brief Narrative:  88 year old female with past medical history of hypothyroidism, chronic kidney disease stage IIIa, hypertension, gastroesophageal reflux disease presenting to [facility] emergency department complaining of weakness and diarrhea. [name] virus positive  2/16 discussed with family about abdominal ultrasound with a dilated common bile duct.  This a.m. GI sent me a chat to consider MRI with MRCP for further evaluation.  Discussed this with the family they would like to proceed to further evaluate for this.  2/17 eating. First constipated, after mrcp had loose stool x1. Otherwise doing well 2/18 doing better. No diarrhea. Eating better   Consultants:    Procedures:   Antimicrobials:      Subjective: No complaints.  No shortness of breath or chest pain.     Objective: Vitals:   12/20/21 2102 12/21/21 0506 12/21/21 0956 12/21/21 1240  BP: (!) 169/73 (!) 151/64 (!) 176/65 (!) 190/70  Pulse: 65 (!) 58 66 60  Resp: 18 17 18    Temp: 97.7 F (36.5 C) 98 F (36.7 C) 97.9 F (36.6 C)   TempSrc: Oral  Oral   SpO2: 100% 98% 97% 100%  Weight:      Height:        Intake/Output Summary (Last 24 hours) at 12/21/2021 1317 Last data filed at 12/21/2021 0600 Gross per 24 hour  Intake 1764.52 ml  Output 0 ml  Net 1764.52 ml   Filed Weights   12/17/21 0933 12/17/21 2346 12/20/21 0505  Weight: 43.6 kg 42.4 kg 46.6 kg    Examination: Calm, NAD Cta no w/r Reg s1/s2 no gallop Soft benign +bs No edema Aaoxox3  Mood and affect appropriate in current setting     Data Reviewed: I have personally reviewed following labs and imaging studies  CBC: Recent Labs  Lab 12/16/21 1829 12/17/21 0426 12/18/21 0005  WBC 5.0 6.2 5.7  NEUTROABS 3.3 4.1 4.1  HGB 11.8* 11.1* 11.2*  HCT 35.2* 33.3* 33.1*  MCV 90.7 89.8 90.2  PLT 230 212 [id number]   Basic  Metabolic Panel: Recent Labs  Lab 12/16/21 1829 12/17/21 0426 12/18/21 0005 12/19/21 0300 12/20/21 0442  NA 129* 132* 130* 132* 134*  K 4.0 3.8 4.1  --  3.4*  CL 97* 97* 100  --  107  CO2 23 25 22   --  21*  GLUCOSE 118* 118* 108*  --  117*  BUN 23 23 20   --  18  CREATININE 1.39* 1.37* 1.17*  --  1.12*  CALCIUM 9.0 9.1 8.7*  --  8.3*  MG 1.6* 2.7* 1.9  --   --   PHOS  --   --  2.8  --   --    GFR: Estimated Creatinine Clearance: 24.1 mL/min (A) (by C-G formula based on SCr of 1.12 mg/dL (H)). Liver Function Tests: Recent Labs  Lab 12/17/21 0426 12/18/21 0005  AST 61* 39  ALT 61* 47*  ALKPHOS 256* 196*  BILITOT 0.6 0.7  PROT 5.9* 5.7*  ALBUMIN 3.0* 3.0*   No results for input(s): LIPASE, AMYLASE in the last 168 hours.  No results for input(s): AMMONIA in the last 168 hours. Coagulation Profile: No results for input(s): INR, PROTIME in the last 168 hours. Cardiac Enzymes: No results for input(s): CKTOTAL, CKMB, CKMBINDEX, TROPONINI in the last 168 hours. BNP (last 3 results) No results for input(s): PROBNP in the last 8760 hours.  HbA1C: No results for input(s): HGBA1C in the last 72 hours. CBG: No results for input(s): GLUCAP in the last 168 hours. Lipid Profile: No results for input(s): CHOL, HDL, LDLCALC, TRIG, CHOLHDL, LDLDIRECT in the last 72 hours. Thyroid Function Tests: No results for input(s): TSH, T4TOTAL, FREET4, T3FREE, THYROIDAB in the last 72 hours.  Anemia Panel: No results for input(s): VITAMINB12, FOLATE, FERRITIN, TIBC, IRON, RETICCTPCT in the last 72 hours. Sepsis Labs: Recent Labs  Lab 12/17/21 0157  LATICACIDVEN 1.1    Recent Results (from the past 240 hour(s))  Resp Panel by RT-PCR (Flu A&B, Covid) Nasopharyngeal Swab     Status: None   Collection Time: 12/11/21  1:20 PM   Specimen: Nasopharyngeal Swab; Nasopharyngeal(NP) swabs in vial transport medium  Result Value Ref Range Status   SARS Coronavirus 2 by RT PCR NEGATIVE NEGATIVE  Final    Comment: (NOTE) SARS-CoV-2 target nucleic acids are NOT DETECTED.  The SARS-CoV-2 RNA is generally detectable in upper respiratory specimens during the acute phase of infection. The lowest concentration of SARS-CoV-2 viral copies this assay can detect is 138 copies/mL. A negative result does not preclude SARS-Cov-2 infection and should not be used as the sole basis for treatment or other patient management decisions. A negative result may occur with  improper specimen collection/handling, submission of specimen other than nasopharyngeal swab, presence of viral mutation(s) within the areas targeted by this assay, and inadequate number of viral copies(<138 copies/mL). A negative result must be combined with clinical observations, patient history, and epidemiological information. The expected result is Negative.  Fact Sheet for Patients:  BloggerCourse.com  Fact Sheet for Healthcare Providers:  SeriousBroker.it  This test is no t yet approved or cleared by the [place] FDA and  has been authorized for detection and/or diagnosis of SARS-CoV-2 by FDA under an Emergency Use Authorization (EUA). This EUA will remain  in effect (meaning this test can be used) for the duration of the COVID-19 declaration under Section 564(b)(1) of the Act, 21 U.S.C.section 360bbb-3(b)(1), unless the authorization is terminated  or revoked sooner.       Influenza A by PCR NEGATIVE NEGATIVE Final   Influenza B by PCR NEGATIVE NEGATIVE Final    Comment: (NOTE) The Xpert Xpress SARS-CoV-2/FLU/RSV plus assay is intended as an aid in the diagnosis of influenza from Nasopharyngeal swab specimens and should not be used as a sole basis for treatment. Nasal washings and aspirates are unacceptable for Xpert Xpress SARS-CoV-2/FLU/RSV testing.  Fact Sheet for Patients: BloggerCourse.com  Fact Sheet for Healthcare  Providers: SeriousBroker.it  This test is not yet approved or cleared by the [place] FDA and has been authorized for detection and/or diagnosis of SARS-CoV-2 by FDA under an Emergency Use Authorization (EUA). This EUA will remain in effect (meaning this test can be used) for the duration of the COVID-19 declaration under Section 564(b)(1) of the Act, 21 U.S.C. section 360bbb-3(b)(1), unless the authorization is terminated or revoked.  Performed at [facility], [address] [place], [place] [address]   Culture, blood (routine x 2)     Status: None (Preliminary result)   Collection Time: 12/17/21  1:57 AM   Specimen: BLOOD  Result Value Ref Range Status   Specimen Description BLOOD LEFT FOREARM  Final   Special Requests   Final    BOTTLES DRAWN AEROBIC AND ANAEROBIC Blood Culture adequate volume   Culture   Final    NO GROWTH 4 DAYS Performed at [facility],  [address] [place], [place] [address]    Report Status PENDING  Incomplete  Culture, blood (routine x 2)     Status: None (Preliminary result)   Collection Time: 12/17/21  1:58 AM   Specimen: BLOOD  Result Value Ref Range Status   [address] Description BLOOD LEFT HAND  Final   Special Requests   Final    BOTTLES DRAWN AEROBIC ONLY Blood Culture results may not be optimal due to an inadequate volume of blood received in culture bottles   Culture   Final    NO GROWTH 4 DAYS Performed at [facility], [address] [place], [place] [address]    Report Status PENDING  Incomplete  Resp Panel by RT-PCR (Flu A&B, Covid) Nasopharyngeal Swab     Status: None   Collection Time: 12/17/21  2:37 AM   Specimen: Nasopharyngeal Swab; Nasopharyngeal(NP) swabs in vial transport medium  Result Value Ref Range Status   SARS Coronavirus 2 by RT PCR NEGATIVE NEGATIVE Final    Comment: (NOTE) SARS-CoV-2 target nucleic acids are NOT DETECTED.  The SARS-CoV-2 RNA is  generally detectable in upper respiratory specimens during the acute phase of infection. The lowest concentration of SARS-CoV-2 viral copies this assay can detect is 138 copies/mL. A negative result does not preclude SARS-Cov-2 infection and should not be used as the sole basis for treatment or other patient management decisions. A negative result may occur with  improper specimen collection/handling, submission of specimen other than nasopharyngeal swab, presence of viral mutation(s) within the areas targeted by this assay, and inadequate number of viral copies(<138 copies/mL). A negative result must be combined with clinical observations, patient history, and epidemiological information. The expected result is Negative.  Fact Sheet for Patients:  BloggerCourse.comhttps://www.fda.gov/media/152166/download  Fact Sheet for Healthcare Providers:  SeriousBroker.ithttps://www.fda.gov/media/152162/download  This test is no t yet approved or cleared by the [place] FDA and  has been authorized for detection and/or diagnosis of SARS-CoV-2 by FDA under an Emergency Use Authorization (EUA). This EUA will remain  in effect (meaning this test can be used) for the duration of the COVID-19 declaration under Section 564(b)(1) of the Act, 21 U.S.C.section 360bbb-3(b)(1), unless the authorization is terminated  or revoked sooner.       Influenza A by PCR NEGATIVE NEGATIVE Final   Influenza B by PCR NEGATIVE NEGATIVE Final    Comment: (NOTE) The Xpert Xpress SARS-CoV-2/FLU/RSV plus assay is intended as an aid in the diagnosis of influenza from Nasopharyngeal swab specimens and should not be used as a sole basis for treatment. Nasal washings and aspirates are unacceptable for Xpert Xpress SARS-CoV-2/FLU/RSV testing.  Fact Sheet for Patients: BloggerCourse.comhttps://www.fda.gov/media/152166/download  Fact Sheet for Healthcare Providers: SeriousBroker.ithttps://www.fda.gov/media/152162/download  This test is not yet approved or cleared by the [place] FDA and has been authorized for detection and/or diagnosis of SARS-CoV-2 by FDA under an Emergency Use Authorization (EUA). This EUA will remain in effect (meaning this test can be used) for the duration of the COVID-19 declaration under Section 564(b)(1) of the Act, 21 U.S.C. section 360bbb-3(b)(1), unless the authorization is terminated or revoked.  Performed at [facility], [address] [place], [place] [address]   Gastrointestinal Panel by PCR , Stool     Status: Abnormal   Collection Time: 12/17/21  8:08 AM   Specimen: Stool  Result Value Ref Range Status   Campylobacter species NOT DETECTED NOT DETECTED Final   Plesimonas shigelloides NOT DETECTED NOT DETECTED Final  Salmonella species NOT DETECTED NOT DETECTED Final   Yersinia enterocolitica NOT DETECTED NOT DETECTED Final   Vibrio species NOT DETECTED NOT DETECTED Final   Vibrio cholerae NOT DETECTED NOT DETECTED Final   Enteroaggregative E coli (EAEC) NOT DETECTED NOT DETECTED Final   Enteropathogenic E coli (EPEC) NOT DETECTED NOT DETECTED Final   Enterotoxigenic E coli (ETEC) NOT DETECTED NOT DETECTED Final   Shiga like toxin producing E coli (STEC) NOT DETECTED NOT DETECTED Final   Shigella/Enteroinvasive E coli (EIEC) NOT DETECTED NOT DETECTED Final   Cryptosporidium NOT DETECTED NOT DETECTED Final   Cyclospora cayetanensis NOT DETECTED NOT DETECTED Final   Entamoeba histolytica NOT DETECTED NOT DETECTED Final   Giardia lamblia NOT DETECTED NOT DETECTED Final   Adenovirus F40/41 NOT DETECTED NOT DETECTED Final   Astrovirus NOT DETECTED NOT DETECTED Final   Norovirus GI/GII DETECTED (A) NOT DETECTED Final    Comment: RESULT CALLED TO, READ BACK BY AND VERIFIED WITH: JAMIE BLUE 12/17/21 1546 MU    Rotavirus A NOT DETECTED NOT DETECTED Final   Sapovirus (I, II, IV, and V) NOT DETECTED NOT DETECTED Final    Comment: Performed at [facility], [address] [place], [place] [address]  C  Difficile Quick Screen w PCR reflex     Status: None   Collection Time: 12/17/21  8:08 AM   Specimen: Stool  Result Value Ref Range Status   C Diff antigen NEGATIVE NEGATIVE Final   C Diff toxin NEGATIVE NEGATIVE Final   C Diff interpretation No C. difficile detected.  Final    Comment: Performed at [facility], [place] [address] [place], [place] [address]         Radiology Studies: MR ABDOMEN MRCP W WO CONTAST  Result Date: 12/19/2021 CLINICAL DATA:  Dilated common duct on ultrasound EXAM: MRI ABDOMEN WITHOUT AND WITH CONTRAST (INCLUDING MRCP) TECHNIQUE: Multiplanar multisequence MR imaging of the abdomen was performed both before and after the administration of intravenous contrast. Heavily T2-weighted images of the biliary and pancreatic ducts were obtained, and three-dimensional MRCP images were rendered by post processing. CONTRAST:  66mL GADAVIST GADOBUTROL 1 MMOL/ML IV SOLN COMPARISON:  Right upper quadrant ultrasound dated 12/17/2021. CT abdomen pelvis dated 01/17/2019. FINDINGS: Motion degraded images. Lower chest: Small bilateral pleural effusions, right greater than left. Hepatobiliary: Liver is within normal limits. No suspicious/enhancing hepatic lesions. Status post cholecystectomy. No intrahepatic ductal dilatation. Common duct measures 8 mm, within the upper limits of normal. No choledocholithiasis is seen. Pancreas:  Motion degraded but grossly unremarkable. Spleen:  Within normal limits. Adrenals/Urinary Tract:  Adrenal glands are within normal limits. Kidneys are within normal limits.  No hydronephrosis. Stomach/Bowel: Mild wall thickening involving the proximal stomach (series 4/image 17), without associated mass. Visualized bowel is grossly unremarkable. Vascular/Lymphatic:  No evidence of abdominal aortic aneurysm. No suspicious abdominal lymphadenopathy. Other:  No abdominal ascites. Musculoskeletal: Degenerative changes of the lumbar spine. IMPRESSION: Motion degraded  images. Status post cholecystectomy. No intrahepatic or extrahepatic ductal dilatation. Common duct measures 8 mm, the upper limits of normal. No choledocholithiasis is seen. Small bilateral pleural effusions, right greater than left. Electronically Signed   By: Julian Hy M.D.   On: 12/19/2021 23:20        Scheduled Meds:  acidophilus  1 capsule Oral Daily   amitriptyline  25 mg Oral QHS   bisoprolol  5 mg Oral Daily   enoxaparin (LOVENOX) injection  30 mg Subcutaneous Daily   feeding supplement  237 mL Oral BID BM   hydrALAZINE  10 mg Oral Q8H   levothyroxine  112 mcg Oral Q0600   pantoprazole  40 mg Oral Daily   Continuous Infusions:    Assessment & Plan:   Principal Problem:   Acute diarrhea Active Problems:   GERD without esophagitis   Hypothyroidism   Protein-calorie malnutrition, severe (HCC)   Chronic kidney disease, stage 3a (HCC)   Hyponatremia   Hypomagnesemia   Generalized weakness   Abnormal LFTs   Pressure injury of skin   Acute Diarrhea- (present on admission) 2/2 norovirus.  C.diff negative 2/18 resolved Continue supportive care   Hypokalemia Replaced Likely from diarrhea   Hyponatremia- (present on admission) Hyponatremia likely secondary to volume depletion 2/18 improved Will DC IV fluids    Generalized weakness Due to vol. Depletion 2/18 PT recommends SNF    Acute on Chronic kidney disease, stage 3a ([place])- (present on admission) Prerenal from diarrhea 2/18 improved with IV fluids Encourage hydration   Hypomagnesemia- (present on admission) replaced   Hypothyroidism- (present on admission) TSH stable Continue synthroid   Protein-calorie malnutrition, severe ([place])- (present on admission) Evidence of poor muscle tone/muscle wasting on exam with BMI of 16.76 Nutrition consulted Encouraging oral intake   GERD without esophagitis- (present on admission) Continuing home regimen of daily PPI therapy for now given that C.  difficile is negative.    Abnormal LFTs- (present on admission) Somewhat elevated LFTs and particularly elevated alkaline phosphatase of unclear etiology; they were elevated back on 12/11/2020 and now AST and ALT of 61 piece Patient is status post cholecystectomy 2/15 improving.  Ultrasound with normal liver Obtain MRI/MRCP for evaluation of common bile duct dilatation MRCP negative  Normocytic Anemia H/h stable    DVT prophylaxis: Lovenox Code Status: DNR Family Communication: Daughter at bedside Disposition Plan: SNF Status is: Inpatient Remains inpatient appropriate  because of unsafe discharge. SNF pending patient is medically stable for discharge                 LOS: 4 days   Time spent: 35 minutes with more than 50% on [place], MD [facility] Pager [phone]  If 7PM-7AM, please contact night-coverage 12/21/2021, 1:17 PM

## 2021-12-22 ENCOUNTER — Other Ambulatory Visit: Payer: Self-pay | Admitting: Adult Health

## 2021-12-22 DIAGNOSIS — R197 Diarrhea, unspecified: Secondary | ICD-10-CM | POA: Diagnosis not present

## 2021-12-22 LAB — CULTURE, BLOOD (ROUTINE X 2)
Culture: NO GROWTH
Culture: NO GROWTH
Special Requests: ADEQUATE

## 2021-12-22 LAB — POTASSIUM: Potassium: 4 mmol/L (ref 3.5–5.1)

## 2021-12-22 MED ORDER — HYDRALAZINE HCL 25 MG PO TABS
25.0000 mg | ORAL_TABLET | Freq: Three times a day (TID) | ORAL | Status: DC
  Administered 2021-12-22 – 2021-12-23 (×3): 25 mg via ORAL
  Filled 2021-12-22 (×4): qty 1

## 2021-12-22 NOTE — Progress Notes (Signed)
PROGRESS NOTE    Shelby Bradley  [id number] DOB: February 01, 1928 DOA: 12/16/2021 PCP: Unk Pinto, MD    Brief Narrative:  88 year old female with past medical history of hypothyroidism, chronic kidney disease stage IIIa, hypertension, gastroesophageal reflux disease presenting to [facility] emergency department complaining of weakness and diarrhea. [name] virus positive  2/16 discussed with family about abdominal ultrasound with a dilated common bile duct.  This a.m. GI sent me a chat to consider MRI with MRCP for further evaluation.  Discussed this with the family they would like to proceed to further evaluate for this.  2/17 eating. First constipated, after mrcp had loose stool x1. Otherwise doing well 2/18 doing better. No diarrhea. Eating better  2/19 no diarrhea,eating well.   Consultants:    Procedures:   Antimicrobials:      Subjective: No abd pain, sob, dizziness. Some leg cramping.    Objective: Vitals:   12/21/21 1716 12/21/21 1836 12/21/21 2020 12/22/21 0506  BP: (!) 200/81 (!) 149/64 (!) 146/68 137/67  Pulse: 65 68 70 60  Resp: 18  18 18   Temp: 98 F (36.7 C)  98.5 F (36.9 C) 97.7 F (36.5 C)  TempSrc: Oral  Oral Oral  SpO2: 99% 98% 97% 99%  Weight:      Height:        Intake/Output Summary (Last 24 hours) at 12/22/2021 0847 Last data filed at 12/22/2021 0829 Gross per 24 hour  Intake 120 ml  Output 375 ml  Net -255 ml   Filed Weights   12/17/21 0933 12/17/21 2346 12/20/21 0505  Weight: 43.6 kg 42.4 kg 46.6 kg    Examination: Calm, NAD Cta no w/r Reg s1/s2 no gallop Soft benign +bs No edema Aaoxox3  Mood and affect appropriate in current setting     Data Reviewed: I have personally reviewed following labs and imaging studies  CBC: Recent Labs  Lab 12/16/21 1829 12/17/21 0426 12/18/21 0005  WBC 5.0 6.2 5.7  NEUTROABS 3.3 4.1 4.1  HGB 11.8* 11.1* 11.2*  HCT 35.2* 33.3* 33.1*  MCV 90.7 89.8 90.2  PLT 230 212 [id number]    Basic Metabolic Panel: Recent Labs  Lab 12/16/21 1829 12/17/21 0426 12/18/21 0005 12/19/21 0300 12/20/21 0442  NA 129* 132* 130* 132* 134*  K 4.0 3.8 4.1  --  3.4*  CL 97* 97* 100  --  107  CO2 23 25 22   --  21*  GLUCOSE 118* 118* 108*  --  117*  BUN 23 23 20   --  18  CREATININE 1.39* 1.37* 1.17*  --  1.12*  CALCIUM 9.0 9.1 8.7*  --  8.3*  MG 1.6* 2.7* 1.9  --   --   PHOS  --   --  2.8  --   --    GFR: Estimated Creatinine Clearance: 24.1 mL/min (A) (by C-G formula based on SCr of 1.12 mg/dL (H)). Liver Function Tests: Recent Labs  Lab 12/17/21 0426 12/18/21 0005  AST 61* 39  ALT 61* 47*  ALKPHOS 256* 196*  BILITOT 0.6 0.7  PROT 5.9* 5.7*  ALBUMIN 3.0* 3.0*   No results for input(s): LIPASE, AMYLASE in the last 168 hours.  No results for input(s): AMMONIA in the last 168 hours. Coagulation Profile: No results for input(s): INR, PROTIME in the last 168 hours. Cardiac Enzymes: No results for input(s): CKTOTAL, CKMB, CKMBINDEX, TROPONINI in the last 168 hours. BNP (last 3 results) No results for input(s): PROBNP in the last 8760 hours.  HbA1C: No results for input(s): HGBA1C in the last 72 hours. CBG: No results for input(s): GLUCAP in the last 168 hours. Lipid Profile: No results for input(s): CHOL, HDL, LDLCALC, TRIG, CHOLHDL, LDLDIRECT in the last 72 hours. Thyroid Function Tests: No results for input(s): TSH, T4TOTAL, FREET4, T3FREE, THYROIDAB in the last 72 hours.  Anemia Panel: No results for input(s): VITAMINB12, FOLATE, FERRITIN, TIBC, IRON, RETICCTPCT in the last 72 hours. Sepsis Labs: Recent Labs  Lab 12/17/21 0157  LATICACIDVEN 1.1    Recent Results (from the past 240 hour(s))  Culture, blood (routine x 2)     Status: None   Collection Time: 12/17/21  1:57 AM   Specimen: BLOOD  Result Value Ref Range Status   Specimen Description BLOOD LEFT FOREARM  Final   Special Requests   Final    BOTTLES DRAWN AEROBIC AND ANAEROBIC Blood Culture  adequate volume   Culture   Final    NO GROWTH 5 DAYS Performed at [facility], [address], [place], [place] [address]    Report Status 12/22/2021 FINAL  Final  Culture, blood (routine x 2)     Status: None   Collection Time: 12/17/21  1:58 AM   Specimen: BLOOD  Result Value Ref Range Status   Specimen Description BLOOD LEFT HAND  Final   Special Requests   Final    BOTTLES DRAWN AEROBIC ONLY Blood Culture results may not be optimal due to an inadequate volume of blood received in culture bottles   Culture   Final    NO GROWTH 5 DAYS Performed at [facility], [place] [address], [place], [place] [address]    Report Status 12/22/2021 FINAL  Final  Resp Panel by RT-PCR (Flu A&B, Covid) Nasopharyngeal Swab     Status: None   Collection Time: 12/17/21  2:37 AM   Specimen: Nasopharyngeal Swab; Nasopharyngeal(NP) swabs in vial transport medium  Result Value Ref Range Status   SARS Coronavirus 2 by RT PCR NEGATIVE NEGATIVE Final    Comment: (NOTE) SARS-CoV-2 target nucleic acids are NOT DETECTED.  The SARS-CoV-2 RNA is generally detectable in upper respiratory specimens during the acute phase of infection. The lowest concentration of SARS-CoV-2 viral copies this assay can detect is 138 copies/mL. A negative result does not preclude SARS-Cov-2 infection and should not be used as the sole basis for treatment or other patient management decisions. A negative result may occur with  improper specimen collection/handling, submission of specimen other than nasopharyngeal swab, presence of viral mutation(s) within the areas targeted by this assay, and inadequate number of viral copies(<138 copies/mL). A negative result must be combined with clinical observations, patient history, and epidemiological information. The expected result is Negative.  Fact Sheet for Patients:  EntrepreneurPulse.com.au  Fact Sheet for Healthcare Providers:   IncredibleEmployment.be  This test is no t yet approved or cleared by the [place] FDA and  has been authorized for detection and/or diagnosis of SARS-CoV-2 by FDA under an Emergency Use Authorization (EUA). This EUA will remain  in effect (meaning this test can be used) for the duration of the COVID-19 declaration under Section 564(b)(1) of the Act, 21 U.S.C.section 360bbb-3(b)(1), unless the authorization is terminated  or revoked sooner.       Influenza A by PCR NEGATIVE NEGATIVE Final   Influenza B by PCR NEGATIVE NEGATIVE Final    Comment: (NOTE) The Xpert Xpress SARS-CoV-2/FLU/RSV plus assay is intended as an aid in the diagnosis of influenza from  Nasopharyngeal swab specimens and should not be used as a sole basis for treatment. Nasal washings and aspirates are unacceptable for Xpert Xpress SARS-CoV-2/FLU/RSV testing.  Fact Sheet for Patients: EntrepreneurPulse.com.au  Fact Sheet for Healthcare Providers: IncredibleEmployment.be  This test is not yet approved or cleared by the [place] FDA and has been authorized for detection and/or diagnosis of SARS-CoV-2 by FDA under an Emergency Use Authorization (EUA). This EUA will remain in effect (meaning this test can be used) for the duration of the COVID-19 declaration under Section 564(b)(1) of the Act, 21 U.S.C. section 360bbb-3(b)(1), unless the authorization is terminated or revoked.  Performed at [facility], [place] [address] [place], [place] [address]   Gastrointestinal Panel by PCR , Stool     Status: Abnormal   Collection Time: 12/17/21  8:08 AM   Specimen: Stool  Result Value Ref Range Status   Campylobacter species NOT DETECTED NOT DETECTED Final   Plesimonas shigelloides NOT DETECTED NOT DETECTED Final   Salmonella species NOT DETECTED NOT DETECTED Final   Yersinia enterocolitica NOT DETECTED NOT DETECTED Final   Vibrio species NOT  DETECTED NOT DETECTED Final   Vibrio cholerae NOT DETECTED NOT DETECTED Final   Enteroaggregative E coli (EAEC) NOT DETECTED NOT DETECTED Final   Enteropathogenic E coli (EPEC) NOT DETECTED NOT DETECTED Final   Enterotoxigenic E coli (ETEC) NOT DETECTED NOT DETECTED Final   Shiga like toxin producing E coli (STEC) NOT DETECTED NOT DETECTED Final   Shigella/Enteroinvasive E coli (EIEC) NOT DETECTED NOT DETECTED Final   Cryptosporidium NOT DETECTED NOT DETECTED Final   Cyclospora cayetanensis NOT DETECTED NOT DETECTED Final   Entamoeba histolytica NOT DETECTED NOT DETECTED Final   Giardia lamblia NOT DETECTED NOT DETECTED Final   Adenovirus F40/41 NOT DETECTED NOT DETECTED Final   Astrovirus NOT DETECTED NOT DETECTED Final   Norovirus GI/GII DETECTED (A) NOT DETECTED Final    Comment: RESULT CALLED TO, READ BACK BY AND VERIFIED WITH: JAMIE BLUE 12/17/21 1546 MU    Rotavirus A NOT DETECTED NOT DETECTED Final   Sapovirus (I, II, IV, and V) NOT DETECTED NOT DETECTED Final    Comment: Performed at [facility], [address] [place], [place] [address]  C Difficile Quick Screen w PCR reflex     Status: None   Collection Time: 12/17/21  8:08 AM   Specimen: Stool  Result Value Ref Range Status   C Diff antigen NEGATIVE NEGATIVE Final   C Diff toxin NEGATIVE NEGATIVE Final   C Diff interpretation No C. difficile detected.  Final    Comment: Performed at [facility], [place] [address] [place], [place] [address]         Radiology Studies: No results found.      Scheduled Meds:  acidophilus  1 capsule Oral Daily   amitriptyline  25 mg Oral QHS   bisoprolol  5 mg Oral Daily   enoxaparin (LOVENOX) injection  30 mg Subcutaneous Daily   feeding supplement  237 mL Oral BID BM   hydrALAZINE  10 mg Oral Q8H   levothyroxine  112 mcg Oral Q0600   pantoprazole  40 mg Oral Daily   Continuous Infusions:    Assessment & Plan:   Principal Problem:   Acute  diarrhea Active Problems:   GERD without esophagitis   Hypothyroidism   Protein-calorie malnutrition, severe (HCC)   Chronic kidney disease, stage 3a (HCC)   Hyponatremia   Hypomagnesemia   Generalized weakness  Abnormal LFTs   Pressure injury of skin   Acute Diarrhea- (present on admission) 2/2 norovirus.  C.diff negative 2/19 resolved Continue p.o. hydration Electrolytes stable   Hypokalemia Likely from diarrhea Replaced K today 4.0    Hyponatremia- (present on admission) Hyponatremia likely secondary to volume depletion 2/19 improved with IV fluids     Generalized weakness Due to vol. Depletion 2/19 PT recommends SNF    Acute on Chronic kidney disease, stage 3a ([place])- (present on admission) Prerenal from diarrhea 2/18 improved with IV fluids Encourage hydration   Hypomagnesemia- (present on admission) replaced   Hypothyroidism- (present on admission) TSH stable Continue synthroid   Protein-calorie malnutrition, severe ([place])- (present on admission) Evidence of poor muscle tone/muscle wasting on exam with BMI of 16.76 Nutrition consulted Encouraging oral intake   GERD without esophagitis- (present on admission) Continuing home regimen of daily PPI therapy for now given that C. difficile is negative.    Abnormal LFTs- (present on admission) Somewhat elevated LFTs and particularly elevated alkaline phosphatase of unclear etiology; they were elevated back on 12/11/2020 and now AST and ALT of 61 piece Patient is status post cholecystectomy 2/15 improving.  Ultrasound with normal liver Obtain MRI/MRCP for evaluation of common bile duct dilatation MRCP negative  Normocytic Anemia H/h stable    DVT prophylaxis: Lovenox Code Status: DNR Family Communication: Daughter at bedside Disposition Plan: SNF Status is: Inpatient Remains inpatient appropriate  because of unsafe discharge. SNF pending patient is medically stable for  discharge                 LOS: 5 days   Time spent: 35 minutes with more than 50% on [place], MD [facility] Pager [phone]  If 7PM-7AM, please contact night-coverage 12/22/2021, 8:47 AM

## 2021-12-23 DIAGNOSIS — I10 Essential (primary) hypertension: Secondary | ICD-10-CM | POA: Diagnosis not present

## 2021-12-23 DIAGNOSIS — R197 Diarrhea, unspecified: Secondary | ICD-10-CM | POA: Diagnosis not present

## 2021-12-23 DIAGNOSIS — E871 Hypo-osmolality and hyponatremia: Secondary | ICD-10-CM | POA: Diagnosis not present

## 2021-12-23 DIAGNOSIS — R748 Abnormal levels of other serum enzymes: Secondary | ICD-10-CM | POA: Diagnosis not present

## 2021-12-23 DIAGNOSIS — A0811 Acute gastroenteropathy due to Norwalk agent: Secondary | ICD-10-CM | POA: Diagnosis not present

## 2021-12-23 DIAGNOSIS — R531 Weakness: Secondary | ICD-10-CM | POA: Diagnosis not present

## 2021-12-23 DIAGNOSIS — E43 Unspecified severe protein-calorie malnutrition: Secondary | ICD-10-CM | POA: Diagnosis not present

## 2021-12-23 DIAGNOSIS — R1311 Dysphagia, oral phase: Secondary | ICD-10-CM | POA: Diagnosis not present

## 2021-12-23 DIAGNOSIS — Z9181 History of falling: Secondary | ICD-10-CM | POA: Diagnosis not present

## 2021-12-23 DIAGNOSIS — E039 Hypothyroidism, unspecified: Secondary | ICD-10-CM | POA: Diagnosis not present

## 2021-12-23 DIAGNOSIS — R2681 Unsteadiness on feet: Secondary | ICD-10-CM | POA: Diagnosis not present

## 2021-12-23 DIAGNOSIS — F419 Anxiety disorder, unspecified: Secondary | ICD-10-CM | POA: Diagnosis not present

## 2021-12-23 DIAGNOSIS — N1831 Chronic kidney disease, stage 3a: Secondary | ICD-10-CM | POA: Diagnosis not present

## 2021-12-23 DIAGNOSIS — R296 Repeated falls: Secondary | ICD-10-CM | POA: Diagnosis not present

## 2021-12-23 DIAGNOSIS — R7401 Elevation of levels of liver transaminase levels: Secondary | ICD-10-CM | POA: Diagnosis not present

## 2021-12-23 DIAGNOSIS — R41841 Cognitive communication deficit: Secondary | ICD-10-CM | POA: Diagnosis not present

## 2021-12-23 DIAGNOSIS — F32A Depression, unspecified: Secondary | ICD-10-CM | POA: Diagnosis not present

## 2021-12-23 DIAGNOSIS — K219 Gastro-esophageal reflux disease without esophagitis: Secondary | ICD-10-CM | POA: Diagnosis not present

## 2021-12-23 DIAGNOSIS — R279 Unspecified lack of coordination: Secondary | ICD-10-CM | POA: Diagnosis not present

## 2021-12-23 DIAGNOSIS — R7989 Other specified abnormal findings of blood chemistry: Secondary | ICD-10-CM | POA: Diagnosis not present

## 2021-12-23 DIAGNOSIS — R1314 Dysphagia, pharyngoesophageal phase: Secondary | ICD-10-CM | POA: Diagnosis not present

## 2021-12-23 DIAGNOSIS — M6281 Muscle weakness (generalized): Secondary | ICD-10-CM | POA: Diagnosis not present

## 2021-12-23 LAB — RESP PANEL BY RT-PCR (FLU A&B, COVID) ARPGX2
Influenza A by PCR: NEGATIVE
Influenza B by PCR: NEGATIVE
SARS Coronavirus 2 by RT PCR: NEGATIVE

## 2021-12-23 MED ORDER — ACETAMINOPHEN 325 MG PO TABS: 650.0000 mg | ORAL_TABLET | Freq: Four times a day (QID) | ORAL | Status: DC | PRN

## 2021-12-23 MED ORDER — ENSURE ENLIVE PO LIQD: 237.0000 mL | Freq: Two times a day (BID) | ORAL | 12 refills | Status: DC

## 2021-12-23 MED ORDER — BISOPROLOL FUMARATE 5 MG PO TABS: 5.0000 mg | ORAL_TABLET | Freq: Every day | ORAL | Status: DC

## 2021-12-23 MED ORDER — HYDRALAZINE HCL 25 MG PO TABS
25.0000 mg | ORAL_TABLET | Freq: Three times a day (TID) | ORAL | Status: DC
Start: 2021-12-23 — End: 2023-05-19

## 2021-12-23 MED ORDER — ALPRAZOLAM 0.25 MG PO TABS
0.1250 mg | ORAL_TABLET | Freq: Every evening | ORAL | 0 refills | Status: AC | PRN
Start: 2021-12-23 — End: 2021-12-25

## 2021-12-23 NOTE — Discharge Summary (Signed)
CLARABELL GRABOSKI [id number] DOB: Oct 01, 1928 DOA: 12/16/2021  PCP: Unk Pinto, MD  Admit date: 12/16/2021 Discharge date: 12/23/2021  Admitted From: Home Disposition: SNF  Recommendations for Outpatient Follow-up:  Follow up with PCP in 1 week Please obtain BMP/CBC in one week      Discharge Condition:Stable CODE STATUS: DNR Diet recommendation: Regular with Ensure supplements    Brief/Interim Summary: Per HPI:88 year old female with past medical history of hypothyroidism, chronic kidney disease stage IIIa, hypertension, gastroesophageal reflux disease presenting to [facility] emergency department complaining of weakness and diarrhea.  Patient's been experiencing diarrhea for over 1 week.  In the days that followed patient continued to experience progressively worsening frequent watery bouts of diarrhea weakness and poor appetite.  Patient presented to see her primary care provider on 2/13 who advised that she seek medical attention in the emergency department.  Patient then presented to [facility] emergency department for evaluation.   Upon evaluation in the emergency department clinically the patient was found to be extremely weak patient was found to be hyponatremic with sodium of 129 with concurrent hypomagnesmia of 1.6.   Her COVID test on admission was negative. GI panel revealed norovirus.  She was started on IV fluids for resuscitation and electrolytes were supplemented.  Her appetite improved.  Physical therapy evaluated the patient and recommended SNF.  Patient remained stable for discharge.    Acute Diarrhea- (present on admission) 2/2 norovirus.  C.diff negative Resolved. Was hydrated   Hypokalemia Likely from diarrhea Replaced stable       Hyponatremia- (present on admission) Hyponatremia likely secondary to volume depletion improved with IV fluids       Generalized weakness Due to vol. Depletion PT recommends SNF     Acute  on Chronic kidney disease, stage 3a ([place])- (present on admission) Prerenal from diarrhea  improved with IV fluids    Hypomagnesemia- (present on admission) replaced   Hypothyroidism- (present on admission) TSH stable Continue synthroid   Protein-calorie malnutrition, severe ([place])- (present on admission) Evidence of poor muscle tone/muscle wasting on exam with BMI of 16.76 Nutrition was consulted Encouraging oral intake   GERD without esophagitis- (present on admission) Continuing home regimen of daily PPI    Abnormal LFTs- (present on admission) Somewhat elevated LFTs and particularly elevated alkaline phosphatase of unclear etiology; they were elevated back on 12/11/2020 and now AST and ALT of 61 piece Patient is status post cholecystectomy 2/15 improving.  Ultrasound with normal liver Obtain MRI/MRCP for evaluation of common bile duct dilatation MRCP  without choledocholilithiasis  Normocytic Anemia H/h stable   Discharge Diagnoses:  Principal Problem:   Acute diarrhea Active Problems:   GERD without esophagitis   Hypothyroidism   Protein-calorie malnutrition, severe (HCC)   Chronic kidney disease, stage 3a (HCC)   Hyponatremia   Hypomagnesemia   Generalized weakness   Abnormal LFTs   Pressure injury of skin    Discharge Instructions  Discharge Instructions     Diet - low sodium heart healthy   Complete by: As directed    Discharge wound care:   Complete by: As directed    As above   Increase activity slowly   Complete by: As directed       Allergies as of 12/23/2021       Reactions   Norvasc [amlodipine Besylate] Other (See Comments)   Reaction not recalled   Sulfa Antibiotics Other (See Comments)   "Made me go out of my head"   Tape Other (See  Comments)   SKIN WILL TEAR VERY EASILY!!!!!        Medication List     STOP taking these medications    bisoprolol-hydrochlorothiazide 5-6.25 MG tablet Commonly known as: Ziac       TAKE  these medications    acetaminophen 325 MG tablet Commonly known as: TYLENOL Take 2 tablets (650 mg total) by mouth every 6 (six) hours as needed for mild pain (or Fever >/= 101).   ALPRAZolam 0.25 MG tablet Commonly known as: XANAX Take 0.5 tablets (0.125 mg total) by mouth at bedtime as needed for up to 2 days for anxiety or sleep (attempt to limit to 5 days a week to avoid addiction and/or dementia). What changed: See the new instructions.   amitriptyline 25 MG tablet Commonly known as: ELAVIL Take  1 tablet  at Bedtime  for Sleep What changed:  medication strength how much to take how to take this when to take this additional instructions   bisoprolol 5 MG tablet Commonly known as: ZEBETA Take 1 tablet (5 mg total) by mouth daily. Start taking on: December 24, 2021   cyanocobalamin 1000 MCG/ML injection Commonly known as: (VITAMIN B-12) INJECT 1 MILLILITER INTO THE SKIN EVERY 30 DAYS What changed: See the new instructions.   feeding supplement Liqd Take 237 mLs by mouth 2 (two) times daily between meals.   hydrALAZINE 25 MG tablet Commonly known as: APRESOLINE Take 1 tablet (25 mg total) by mouth every 8 (eight) hours.   levothyroxine 112 MCG tablet Commonly known as: SYNTHROID Take 1 tablet (112 mcg total) by mouth daily before breakfast.   omeprazole 20 MG capsule Commonly known as: PRILOSEC Take  1 capsule  Daily to Prevent Heartburn & Indigestion. What changed:  how much to take how to take this when to take this additional instructions   Restora Caps TAKE 1 TABLET DAILY FOR INTESTINAL HEALTHT What changed: See the new instructions.   Vitamin D-3 125 MCG (5000 UT) Tabs Take 5,000 Units by mouth 2 (two) times a week.               Discharge Care Instructions  (From admission, onward)           Start     Ordered   12/23/21 0000  Discharge wound care:       Comments: As above   12/23/21 [id number]            Follow-up Information      Unk Pinto, MD Follow up in 1 week(s).   Specialty: Internal Medicine Contact information: [address] [place] [address] [phone]                Allergies  Allergen Reactions   Norvasc [Amlodipine Besylate] Other (See Comments)    Reaction not recalled   Sulfa Antibiotics Other (See Comments)    "Made me go out of my head"   Tape Other (See Comments)    SKIN WILL TEAR VERY EASILY!!!!!    Consultations:    Procedures/Studies: DG Chest Portable 1 View  Result Date: 12/16/2021 CLINICAL DATA:  Productive cough EXAM: PORTABLE CHEST 1 VIEW COMPARISON:  02/14/2021, 08/13/2020, 01/19/2019 FINDINGS: Chronic coarse interstitial opacity greatest at the apices. Chronic elevation right diaphragm. No consolidation or effusion. No pneumothorax. Stable cardiomediastinal silhouette. IMPRESSION: No active disease. Similar elevation of right diaphragm with chronic interstitial opacity at the apices. Electronically Signed   By: Donavan Foil M.D.   On: 12/16/2021 19:06  MR ABDOMEN MRCP W WO CONTAST  Result Date: 12/19/2021 CLINICAL DATA:  Dilated common duct on ultrasound EXAM: MRI ABDOMEN WITHOUT AND WITH CONTRAST (INCLUDING MRCP) TECHNIQUE: Multiplanar multisequence MR imaging of the abdomen was performed both before and after the administration of intravenous contrast. Heavily T2-weighted images of the biliary and pancreatic ducts were obtained, and three-dimensional MRCP images were rendered by post processing. CONTRAST:  51mL GADAVIST GADOBUTROL 1 MMOL/ML IV SOLN COMPARISON:  Right upper quadrant ultrasound dated 12/17/2021. CT abdomen pelvis dated 01/17/2019. FINDINGS: Motion degraded images. Lower chest: Small bilateral pleural effusions, right greater than left. Hepatobiliary: Liver is within normal limits. No suspicious/enhancing hepatic lesions. Status post cholecystectomy. No intrahepatic ductal dilatation. Common duct measures 8 mm, within the upper  limits of normal. No choledocholithiasis is seen. Pancreas:  Motion degraded but grossly unremarkable. Spleen:  Within normal limits. Adrenals/Urinary Tract:  Adrenal glands are within normal limits. Kidneys are within normal limits.  No hydronephrosis. Stomach/Bowel: Mild wall thickening involving the proximal stomach (series 4/image 17), without associated mass. Visualized bowel is grossly unremarkable. Vascular/Lymphatic:  No evidence of abdominal aortic aneurysm. No suspicious abdominal lymphadenopathy. Other:  No abdominal ascites. Musculoskeletal: Degenerative changes of the lumbar spine. IMPRESSION: Motion degraded images. Status post cholecystectomy. No intrahepatic or extrahepatic ductal dilatation. Common duct measures 8 mm, the upper limits of normal. No choledocholithiasis is seen. Small bilateral pleural effusions, right greater than left. Electronically Signed   By: Charline Bills M.D.   On: 12/19/2021 23:20   US Abdomen Limited RUQ (LIVER/GB)  Result Date: 12/17/2021 CLINICAL DATA:  Abnormal liver function tests. Prior cholecystectomy EXAM: ULTRASOUND ABDOMEN LIMITED RIGHT UPPER QUADRANT COMPARISON:  Sonogram dated April 24, 2021 FINDINGS: Gallbladder: Status post cholecystectomy Common bile duct/biliary system: Diameter: Dilated measuring up to 9.4 mm (previously 4.9 mm). There is also dilated intrahepatic biliary ducts. Liver: No focal lesion identified. Within normal limits in parenchymal echogenicity. Portal vein is patent on color Doppler imaging with normal direction of blood flow towards the liver. Other: None. IMPRESSION: 1. Status post cholecystectomy with interval increase in diameter of the common duct now measuring up to 9 mm (previously 4.9 mm) as well as mildly dilated intrahepatic biliary ducts. 2.  Normal liver without evidence of appreciable hepatic mass. Electronically Signed   By: Larose Hires D.O.   On: 12/17/2021 11:10      Subjective: No sob, cp. No  diarrhea  Discharge Exam: Vitals:   12/23/21 0543 12/23/21 0915  BP: (!) 168/87 (!) 159/72  Pulse:  80  Resp:  18  Temp:  98 F (36.7 C)  SpO2:  98%   Vitals:   12/23/21 0536 12/23/21 0543 12/23/21 0600 12/23/21 0915  BP: (!) 192/83 (!) 168/87  (!) 159/72  Pulse: 72   80  Resp: 18   18  Temp: 98.1 F (36.7 C)   98 F (36.7 C)  TempSrc: Oral   Oral  SpO2: 97%   98%  Weight:   47.1 kg   Height:        General: Pt is alert, awake, not in acute distress Cardiovascular: RRR, S1/S2 +, no rubs, no gallops Respiratory: CTA bilaterally, no wheezing, no rhonchi Abdominal: Soft, NT, ND, bowel sounds + Extremities: no edema, no cyanosis    The results of significant diagnostics from this hospitalization (including imaging, microbiology, ancillary and laboratory) are listed below for reference.     Microbiology: Recent Results (from the past 240 hour(s))  Culture, blood (routine x 2)  Status: None   Collection Time: 12/17/21  1:57 AM   Specimen: BLOOD  Result Value Ref Range Status   Specimen Description BLOOD LEFT FOREARM  Final   Special Requests   Final    BOTTLES DRAWN AEROBIC AND ANAEROBIC Blood Culture adequate volume   Culture   Final    NO GROWTH 5 DAYS Performed at [facility], [address], [place], [place] [address]    Report Status 12/22/2021 FINAL  Final  Culture, blood (routine x 2)     Status: None   Collection Time: 12/17/21  1:58 AM   Specimen: BLOOD  Result Value Ref Range Status   Specimen Description BLOOD LEFT HAND  Final   Special Requests   Final    BOTTLES DRAWN AEROBIC ONLY Blood Culture results may not be optimal due to an inadequate volume of blood received in culture bottles   Culture   Final    NO GROWTH 5 DAYS Performed at [facility], [place] [address], [place], [place] [address]    Report Status 12/22/2021 FINAL  Final  Resp Panel by RT-PCR (Flu A&B, Covid) Nasopharyngeal Swab     Status: None   Collection Time:  12/17/21  2:37 AM   Specimen: Nasopharyngeal Swab; Nasopharyngeal(NP) swabs in vial transport medium  Result Value Ref Range Status   SARS Coronavirus 2 by RT PCR NEGATIVE NEGATIVE Final    Comment: (NOTE) SARS-CoV-2 target nucleic acids are NOT DETECTED.  The SARS-CoV-2 RNA is generally detectable in upper respiratory specimens during the acute phase of infection. The lowest concentration of SARS-CoV-2 viral copies this assay can detect is 138 copies/mL. A negative result does not preclude SARS-Cov-2 infection and should not be used as the sole basis for treatment or other patient management decisions. A negative result may occur with  improper specimen collection/handling, submission of specimen other than nasopharyngeal swab, presence of viral mutation(s) within the areas targeted by this assay, and inadequate number of viral copies(<138 copies/mL). A negative result must be combined with clinical observations, patient history, and epidemiological information. The expected result is Negative.  Fact Sheet for Patients:  EntrepreneurPulse.com.au  Fact Sheet for Healthcare Providers:  IncredibleEmployment.be  This test is no t yet approved or cleared by the [place] FDA and  has been authorized for detection and/or diagnosis of SARS-CoV-2 by FDA under an Emergency Use Authorization (EUA). This EUA will remain  in effect (meaning this test can be used) for the duration of the COVID-19 declaration under Section 564(b)(1) of the Act, 21 U.S.C.section 360bbb-3(b)(1), unless the authorization is terminated  or revoked sooner.       Influenza A by PCR NEGATIVE NEGATIVE Final   Influenza B by PCR NEGATIVE NEGATIVE Final    Comment: (NOTE) The Xpert Xpress SARS-CoV-2/FLU/RSV plus assay is intended as an aid in the diagnosis of influenza from Nasopharyngeal swab specimens and should not be used as a sole basis for treatment. Nasal washings  and aspirates are unacceptable for Xpert Xpress SARS-CoV-2/FLU/RSV testing.  Fact Sheet for Patients: EntrepreneurPulse.com.au  Fact Sheet for Healthcare Providers: IncredibleEmployment.be  This test is not yet approved or cleared by the [place] FDA and has been authorized for detection and/or diagnosis of SARS-CoV-2 by FDA under an Emergency Use Authorization (EUA). This EUA will remain in effect (meaning this test can be used) for the duration of the COVID-19 declaration under Section 564(b)(1) of the Act, 21 U.S.C. section 360bbb-3(b)(1), unless the authorization is terminated or  revoked.  Performed at [facility], [place] [address], [place], [place] [address]   Gastrointestinal Panel by PCR , Stool     Status: Abnormal   Collection Time: 12/17/21  8:08 AM   Specimen: Stool  Result Value Ref Range Status   Campylobacter species NOT DETECTED NOT DETECTED Final   Plesimonas shigelloides NOT DETECTED NOT DETECTED Final   Salmonella species NOT DETECTED NOT DETECTED Final   Yersinia enterocolitica NOT DETECTED NOT DETECTED Final   Vibrio species NOT DETECTED NOT DETECTED Final   Vibrio cholerae NOT DETECTED NOT DETECTED Final   Enteroaggregative E coli (EAEC) NOT DETECTED NOT DETECTED Final   Enteropathogenic E coli (EPEC) NOT DETECTED NOT DETECTED Final   Enterotoxigenic E coli (ETEC) NOT DETECTED NOT DETECTED Final   Shiga like toxin producing E coli (STEC) NOT DETECTED NOT DETECTED Final   Shigella/Enteroinvasive E coli (EIEC) NOT DETECTED NOT DETECTED Final   Cryptosporidium NOT DETECTED NOT DETECTED Final   Cyclospora cayetanensis NOT DETECTED NOT DETECTED Final   Entamoeba histolytica NOT DETECTED NOT DETECTED Final   Giardia lamblia NOT DETECTED NOT DETECTED Final   Adenovirus F40/41 NOT DETECTED NOT DETECTED Final   Astrovirus NOT DETECTED NOT DETECTED Final   Norovirus GI/GII DETECTED (A) NOT DETECTED Final    Comment:  RESULT CALLED TO, READ BACK BY AND VERIFIED WITH: JAMIE BLUE 12/17/21 1546 MU    Rotavirus A NOT DETECTED NOT DETECTED Final   Sapovirus (I, II, IV, and V) NOT DETECTED NOT DETECTED Final    Comment: Performed at [facility], [address] [place], [place] [address]  C Difficile Quick Screen w PCR reflex     Status: None   Collection Time: 12/17/21  8:08 AM   Specimen: Stool  Result Value Ref Range Status   C Diff antigen NEGATIVE NEGATIVE Final   C Diff toxin NEGATIVE NEGATIVE Final   C Diff interpretation No C. difficile detected.  Final    Comment: Performed at [facility], [place] [address], [place], [place] [address]     Labs: BNP (last 3 results) No results for input(s): BNP in the last 8760 hours. Basic Metabolic Panel: Recent Labs  Lab 12/16/21 1829 12/17/21 0426 12/18/21 0005 12/19/21 0300 12/20/21 0442 12/22/21 1027  NA 129* 132* 130* 132* 134*  --   K 4.0 3.8 4.1  --  3.4* 4.0  CL 97* 97* 100  --  107  --   CO2 23 25 22   --  21*  --   GLUCOSE 118* 118* 108*  --  117*  --   BUN 23 23 20   --  18  --   CREATININE 1.39* 1.37* 1.17*  --  1.12*  --   CALCIUM 9.0 9.1 8.7*  --  8.3*  --   MG 1.6* 2.7* 1.9  --   --   --   PHOS  --   --  2.8  --   --   --    Liver Function Tests: Recent Labs  Lab 12/17/21 0426 12/18/21 0005  AST 61* 39  ALT 61* 47*  ALKPHOS 256* 196*  BILITOT 0.6 0.7  PROT 5.9* 5.7*  ALBUMIN 3.0* 3.0*   No results for input(s): LIPASE, AMYLASE in the last 168 hours. No results for input(s): AMMONIA in the last 168 hours. CBC: Recent Labs  Lab 12/16/21 1829 12/17/21 0426 12/18/21 0005  WBC 5.0 6.2 5.7  NEUTROABS 3.3 4.1 4.1  HGB 11.8*  11.1* 11.2*  HCT 35.2* 33.3* 33.1*  MCV 90.7 89.8 90.2  PLT 230 212 200   Cardiac Enzymes: No results for input(s): CKTOTAL, CKMB, CKMBINDEX, TROPONINI in the last 168 hours. BNP: Invalid input(s): POCBNP CBG: No results for input(s): GLUCAP in the last 168 hours. D-Dimer No  results for input(s): DDIMER in the last 72 hours. Hgb A1c No results for input(s): HGBA1C in the last 72 hours. Lipid Profile No results for input(s): CHOL, HDL, LDLCALC, TRIG, CHOLHDL, LDLDIRECT in the last 72 hours. Thyroid function studies No results for input(s): TSH, T4TOTAL, T3FREE, THYROIDAB in the last 72 hours.  Invalid input(s): FREET3 Anemia work up No results for input(s): VITAMINB12, FOLATE, FERRITIN, TIBC, IRON, RETICCTPCT in the last 72 hours. Urinalysis    Component Value Date/Time   COLORURINE YELLOW 12/17/2021 0200   APPEARANCEUR CLEAR 12/17/2021 0200   LABSPEC 1.006 12/17/2021 0200   PHURINE 5.0 12/17/2021 0200   GLUCOSEU NEGATIVE 12/17/2021 0200   HGBUR SMALL (A) 12/17/2021 0200   BILIRUBINUR NEGATIVE 12/17/2021 0200   KETONESUR NEGATIVE 12/17/2021 0200   PROTEINUR NEGATIVE 12/17/2021 0200   UROBILINOGEN 0.2 06/29/2014 1436   NITRITE NEGATIVE 12/17/2021 0200   LEUKOCYTESUR TRACE (A) 12/17/2021 0200   Sepsis Labs Invalid input(s): PROCALCITONIN,  WBC,  LACTICIDVEN Microbiology Recent Results (from the past 240 hour(s))  Culture, blood (routine x 2)     Status: None   Collection Time: 12/17/21  1:57 AM   Specimen: BLOOD  Result Value Ref Range Status   Specimen Description BLOOD LEFT FOREARM  Final   Special Requests   Final    BOTTLES DRAWN AEROBIC AND ANAEROBIC Blood Culture adequate volume   Culture   Final    NO GROWTH 5 DAYS Performed at [facility], [address], [place], [place] [address]    Report Status 12/22/2021 FINAL  Final  Culture, blood (routine x 2)     Status: None   Collection Time: 12/17/21  1:58 AM   Specimen: BLOOD  Result Value Ref Range Status   Specimen Description BLOOD LEFT HAND  Final   Special Requests   Final    BOTTLES DRAWN AEROBIC ONLY Blood Culture results may not be optimal due to an inadequate volume of blood received in culture bottles   Culture   Final    NO GROWTH 5 DAYS Performed at [facility], [place] [address], [place], [place] [address]    Report Status 12/22/2021 FINAL  Final  Resp Panel by RT-PCR (Flu A&B, Covid) Nasopharyngeal Swab     Status: None   Collection Time: 12/17/21  2:37 AM   Specimen: Nasopharyngeal Swab; Nasopharyngeal(NP) swabs in vial transport medium  Result Value Ref Range Status   SARS Coronavirus 2 by RT PCR NEGATIVE NEGATIVE Final    Comment: (NOTE) SARS-CoV-2 target nucleic acids are NOT DETECTED.  The SARS-CoV-2 RNA is generally detectable in upper respiratory specimens during the acute phase of infection. The lowest concentration of SARS-CoV-2 viral copies this assay can detect is 138 copies/mL. A negative result does not preclude SARS-Cov-2 infection and should not be used as the sole basis for treatment or other patient management decisions. A negative result may occur with  improper specimen collection/handling, submission of specimen other than nasopharyngeal swab, presence of viral mutation(s) within the areas targeted by this assay, and inadequate number of viral copies(<138 copies/mL). A negative result must be combined with clinical observations, patient history, and epidemiological information. The expected result is  Negative.  Fact Sheet for Patients:  EntrepreneurPulse.com.au  Fact Sheet for Healthcare Providers:  IncredibleEmployment.be  This test is no t yet approved or cleared by the [place] FDA and  has been authorized for detection and/or diagnosis of SARS-CoV-2 by FDA under an Emergency Use Authorization (EUA). This EUA will remain  in effect (meaning this test can be used) for the duration of the COVID-19 declaration under Section 564(b)(1) of the Act, 21 U.S.C.section 360bbb-3(b)(1), unless the authorization is terminated  or revoked sooner.       Influenza A by PCR NEGATIVE NEGATIVE Final   Influenza B by PCR NEGATIVE NEGATIVE Final    Comment: (NOTE) The Xpert Xpress  SARS-CoV-2/FLU/RSV plus assay is intended as an aid in the diagnosis of influenza from Nasopharyngeal swab specimens and should not be used as a sole basis for treatment. Nasal washings and aspirates are unacceptable for Xpert Xpress SARS-CoV-2/FLU/RSV testing.  Fact Sheet for Patients: EntrepreneurPulse.com.au  Fact Sheet for Healthcare Providers: IncredibleEmployment.be  This test is not yet approved or cleared by the [place] FDA and has been authorized for detection and/or diagnosis of SARS-CoV-2 by FDA under an Emergency Use Authorization (EUA). This EUA will remain in effect (meaning this test can be used) for the duration of the COVID-19 declaration under Section 564(b)(1) of the Act, 21 U.S.C. section 360bbb-3(b)(1), unless the authorization is terminated or revoked.  Performed at [facility], [place] [address], [place], [place] [address]   Gastrointestinal Panel by PCR , Stool     Status: Abnormal   Collection Time: 12/17/21  8:08 AM   Specimen: Stool  Result Value Ref Range Status   Campylobacter species NOT DETECTED NOT DETECTED Final   Plesimonas shigelloides NOT DETECTED NOT DETECTED Final   Salmonella species NOT DETECTED NOT DETECTED Final   Yersinia enterocolitica NOT DETECTED NOT DETECTED Final   Vibrio species NOT DETECTED NOT DETECTED Final   Vibrio cholerae NOT DETECTED NOT DETECTED Final   Enteroaggregative E coli (EAEC) NOT DETECTED NOT DETECTED Final   Enteropathogenic E coli (EPEC) NOT DETECTED NOT DETECTED Final   Enterotoxigenic E coli (ETEC) NOT DETECTED NOT DETECTED Final   Shiga like toxin producing E coli (STEC) NOT DETECTED NOT DETECTED Final   Shigella/Enteroinvasive E coli (EIEC) NOT DETECTED NOT DETECTED Final   Cryptosporidium NOT DETECTED NOT DETECTED Final   Cyclospora cayetanensis NOT DETECTED NOT DETECTED Final   Entamoeba histolytica NOT DETECTED NOT DETECTED Final   Giardia lamblia NOT  DETECTED NOT DETECTED Final   Adenovirus F40/41 NOT DETECTED NOT DETECTED Final   Astrovirus NOT DETECTED NOT DETECTED Final   Norovirus GI/GII DETECTED (A) NOT DETECTED Final    Comment: RESULT CALLED TO, READ BACK BY AND VERIFIED WITH: JAMIE BLUE 12/17/21 1546 MU    Rotavirus A NOT DETECTED NOT DETECTED Final   Sapovirus (I, II, IV, and V) NOT DETECTED NOT DETECTED Final    Comment: Performed at [facility], [address] [place], [place] [address]  C Difficile Quick Screen w PCR reflex     Status: None   Collection Time: 12/17/21  8:08 AM   Specimen: Stool  Result Value Ref Range Status   C Diff antigen NEGATIVE NEGATIVE Final   C Diff toxin NEGATIVE NEGATIVE Final   C Diff interpretation No C. difficile detected.  Final    Comment: Performed at [facility], [place] [address] [place], [place] [address]     Time coordinating discharge: Over  30 minutes  SIGNED:   Nolberto Hanlon, MD  [facility] 12/23/2021, 11:34 AM Pager   If 7PM-7AM, please contact night-coverage [email or web address] Password [email or web address]

## 2021-12-23 NOTE — TOC Progression Note (Addendum)
Transition of Care [facility]) - Initial/Assessment Note    Patient Details  Name: Shelby Bradley MRN: [id number] Date of Birth: [birth year]  Transition of Care [facility]) CM/SW Contact:    Milinda Antis, [place] Phone Number: 12/23/2021, 10:02 AM  Clinical Narrative:                 CSW contacted the family's facility of choice, [facility].  Olivia Mackie, admissions director, is reviewing the referral and will inform CSW of the determination.    11:00-  CSW informed that the patient can go to [facility] today.  The agency is requesting a COVID test prior to admission.  Attending MD and floor RN have been notified.   Expected Discharge Plan: Skilled Nursing Facility Barriers to Discharge: SNF Pending bed offer   Patient Goals and CMS Choice   CMS Medicare.gov Compare Post Acute Care list provided to:: Patient Represenative (must comment) Choice offered to / list presented to : Adult Children  Expected Discharge Plan and Services Expected Discharge Plan: [place]       Living arrangements for the past 2 months: Single Family Home Expected Discharge Date: 12/23/21                                    Prior Living Arrangements/Services Living arrangements for the past 2 months: Single Family Home Lives with:: Self Patient language and need for interpreter reviewed:: Yes Do you feel safe going back to the place where you live?: Yes      Need for Family Participation in Patient Care: Yes (Comment) Care giver support system in place?: Yes (comment)   Criminal Activity/Legal Involvement Pertinent to Current Situation/Hospitalization: No - Comment as needed  Activities of Daily Living Home Assistive Devices/Equipment: Blood pressure cuff, Dentures (specify type) (Full; upper and lower) ADL Screening (condition at time of admission) Patient's cognitive ability adequate to safely complete daily activities?: Yes Is the patient deaf or have difficulty hearing?: Yes Does the  patient have difficulty seeing, even when wearing glasses/contacts?: No Does the patient have difficulty concentrating, remembering, or making decisions?: No Patient able to express need for assistance with ADLs?: Yes Does the patient have difficulty dressing or bathing?: No Independently performs ADLs?: Yes (appropriate for developmental age) Does the patient have difficulty walking or climbing stairs?: No Weakness of Legs: Both Weakness of Arms/Hands: Both  Permission Sought/Granted   Permission granted to share information with : Yes, Verbal Permission Granted     Permission granted to share info w AGENCY: SNF        Emotional Assessment Appearance:: Appears stated age   Affect (typically observed): Accepting Orientation: : Oriented to Self, Oriented to Place, Oriented to  Time, Oriented to Situation Alcohol / Substance Use: Not Applicable Psych Involvement: No (comment)  Admission diagnosis:  Diarrhea of presumed infectious origin [R19.7] Acute diarrhea [R19.7] Weakness [R53.1] Abnormal LFTs [R79.89] Patient Active Problem List   Diagnosis Date Noted   Pressure injury of skin 12/18/2021   Essential hypertension 12/17/2021   Acute diarrhea 12/17/2021   Hypomagnesemia 12/17/2021   Generalized weakness 12/17/2021   Abnormal LFTs 12/17/2021   Elevated LFTs 04/23/2021   External hemorrhoids 12/03/2020   Iron deficiency 11/28/2020   Peripheral edema 09/17/2020   BMI less than 19,adult 09/14/2020   Hyponatremia    Chronic kidney disease, stage 3a ([place]) 09/12/2019   Protein-calorie malnutrition, severe ([place]) 11/18/2016   COPD (  chronic obstructive pulmonary disease) ([place]) 07/10/2016   Hypothyroidism 06/28/2015   Macular degeneration 06/28/2015   Medication management 12/05/2014   Labile hypertension    Hyperlipidemia, mixed    B12 deficiency    Normochromic normocytic anemia    GERD without esophagitis    Vitamin D deficiency    Abnormal glucose    PCP:  Unk Pinto, MD Pharmacy:   [id number] - [place], [address] [place] [address] Phone: [phone] Fax: [phone]     Social Determinants of Health (SDOH) Interventions    Readmission Risk Interventions No flowsheet data found.

## 2021-12-23 NOTE — TOC Transition Note (Signed)
Transition of Care [facility]) - CM/SW Discharge Note   Patient Details  Name: Shelby Bradley MRN: [id number] Date of Birth: 08-14-1928  Transition [facility] Care [facility]) CM/SW Contact:  Ralene Bathe, LCSWA Phone Number: 12/23/2021, 1:27 PM   Clinical Narrative:    Patient will DC to:  [facility] (SNF) Anticipated DC date: 12/23/2021 Family notified:  Yes Transport by: Sharin Mons   Per MD patient ready for DC to SNF. RN to call report prior to discharge [phone] room 205. RN, patient, patient's family, and facility notified of DC. Discharge Summary and FL2 sent to facility. DC packet on chart. Ambulance transport will be requested for patient.   CSW will sign off for now as social work intervention is no longer needed. Please consult [place] again if new needs arise.     Final next level of care: Skilled Nursing Facility Barriers to Discharge: Barriers Resolved   Patient Goals and CMS Choice   CMS Medicare.gov Compare Post Acute Care list provided to:: Patient Represenative (must comment) Choice offered to / list presented to : Adult Children  Discharge Placement              Patient chooses bed at:  ([facility]) Patient to be transferred to facility by: [facility] Name of family member notified: Delray Alt (Daughter)   [phone] Patient and family notified of of transfer: 12/23/21  Discharge Plan and Services                                     Social Determinants of Health (SDOH) Interventions     Readmission Risk Interventions No flowsheet data found.

## 2021-12-24 DIAGNOSIS — M6281 Muscle weakness (generalized): Secondary | ICD-10-CM | POA: Diagnosis not present

## 2021-12-24 DIAGNOSIS — I10 Essential (primary) hypertension: Secondary | ICD-10-CM | POA: Diagnosis not present

## 2021-12-24 DIAGNOSIS — A0811 Acute gastroenteropathy due to Norwalk agent: Secondary | ICD-10-CM | POA: Diagnosis not present

## 2021-12-24 DIAGNOSIS — F32A Depression, unspecified: Secondary | ICD-10-CM | POA: Diagnosis not present

## 2021-12-24 DIAGNOSIS — F419 Anxiety disorder, unspecified: Secondary | ICD-10-CM | POA: Diagnosis not present

## 2021-12-24 DIAGNOSIS — E039 Hypothyroidism, unspecified: Secondary | ICD-10-CM | POA: Diagnosis not present

## 2021-12-25 DIAGNOSIS — R748 Abnormal levels of other serum enzymes: Secondary | ICD-10-CM | POA: Diagnosis not present

## 2021-12-25 DIAGNOSIS — R531 Weakness: Secondary | ICD-10-CM | POA: Diagnosis not present

## 2021-12-25 DIAGNOSIS — E871 Hypo-osmolality and hyponatremia: Secondary | ICD-10-CM | POA: Diagnosis not present

## 2021-12-25 DIAGNOSIS — A0811 Acute gastroenteropathy due to Norwalk agent: Secondary | ICD-10-CM | POA: Diagnosis not present

## 2021-12-30 ENCOUNTER — Ambulatory Visit: Payer: Medicare Other

## 2022-01-03 ENCOUNTER — Other Ambulatory Visit: Payer: Self-pay | Admitting: *Deleted

## 2022-01-03 NOTE — Patient Outreach (Signed)
Per [place] eligible member resides in [facility].  Screened for potential post SNF care coordination services as a benefit of [employer] plan. ? ?Member's PCP at [facility] has [facility] care management services available.  ? ?Shelby Bradley admitted to SNF on 12/23/21 after hospitalization ? ?Update received from [name] SNF SW reporting member's family is looking into ALF or LTC placement. ? ? ? ?[name], MSN, RN,BSN ?[place] Coordinator ?[phone] [facility]) ?[phone]  (Toll free office)  ? ? ? ? ? ?  ?

## 2022-01-06 ENCOUNTER — Other Ambulatory Visit: Payer: Self-pay | Admitting: *Deleted

## 2022-01-06 NOTE — Patient Outreach (Signed)
[facility] Post- Acute Care Coordinator follow up. [facility] eligible member resides in [facility] SNF.  Screened for potential  post SNF care coordination needs as a benefit of [employer] plan. ? ?Member's PCP at [facility] has [facility] care management services available.  ? ?Facility site visit to [facility] skilled nursing facility. Met with [name], SNF SW  concerning transition plan and potential care coordination needs. [name] reports transition plan is possible home vs LTC vs ALF. Family has not decided yet.  ? ?Will continue to follow transition plan/date. Will send notification to [facility] if member returns home. ? ?Shelby Rolling, MSN, RN,BSN ?[place] Coordinator ?[phone] [facility]) ?[phone]  (Toll free office)  ? ? ? ? ?  ?

## 2022-01-08 ENCOUNTER — Encounter: Payer: Medicare Other | Admitting: Adult Health

## 2022-01-17 ENCOUNTER — Other Ambulatory Visit: Payer: Self-pay | Admitting: *Deleted

## 2022-01-17 NOTE — Patient Outreach (Signed)
[facility] Post- Acute Care Coordinator follow up. Per [facility] eligible member resides in [facility].  Screened for potential post SNF care coordination services. Member's PCP at [facility] has [facility] care management team.  ? ?Update received from [facility] SN[facility] SW indicating [name] has transitioned to LTC at [facility].  ? ?Will sign off. No identifiable post SNF care management/care coordination needs.  ? ? ? ?Raiford Noble, MSN, RN,BSN ?[facility] Post Acute Care Coordinator ?[phone] [facility]) ?[phone]  (Toll free office)   ?

## 2022-01-18 DIAGNOSIS — Z20822 Contact with and (suspected) exposure to covid-19: Secondary | ICD-10-CM | POA: Diagnosis not present

## 2022-01-31 DIAGNOSIS — Z20822 Contact with and (suspected) exposure to covid-19: Secondary | ICD-10-CM | POA: Diagnosis not present

## 2022-02-17 DIAGNOSIS — Z20822 Contact with and (suspected) exposure to covid-19: Secondary | ICD-10-CM | POA: Diagnosis not present

## 2022-03-09 DIAGNOSIS — R630 Anorexia: Secondary | ICD-10-CM | POA: Diagnosis not present

## 2022-03-09 DIAGNOSIS — R051 Acute cough: Secondary | ICD-10-CM | POA: Diagnosis not present

## 2022-03-09 DIAGNOSIS — J159 Unspecified bacterial pneumonia: Secondary | ICD-10-CM | POA: Diagnosis not present

## 2022-03-10 DIAGNOSIS — Z20822 Contact with and (suspected) exposure to covid-19: Secondary | ICD-10-CM | POA: Diagnosis not present

## 2022-03-10 DIAGNOSIS — R059 Cough, unspecified: Secondary | ICD-10-CM | POA: Diagnosis not present

## 2022-03-21 DIAGNOSIS — J189 Pneumonia, unspecified organism: Secondary | ICD-10-CM | POA: Diagnosis not present

## 2022-03-25 IMAGING — MR MR ABDOMEN WO/W CM MRCP
20 of 22 series · 45 of 48 positions shown · IV contrast (Contrast agent)
Comparison: Right upper quadrant ultrasound dated 12/17/2021. CT
abdomen pelvis dated 01/17/2019.

CLINICAL DATA: Dilated common duct on ultrasound

EXAM:
MRI ABDOMEN WITHOUT AND WITH CONTRAST (INCLUDING MRCP)
TECHNIQUE: Multiplanar multisequence MR imaging of the abdomen was performed
both before and after the administration of intravenous contrast.
Heavily T2-weighted images of the biliary and pancreatic ducts were
obtained, and three-dimensional MRCP images were rendered by post
processing.
CONTRAST:  4mL GADAVIST GADOBUTROL 1 MMOL/ML IV SOLN

[Series 4: ax haste · axial · 6.0mm · 1.19mm/px · 1 of 30 slices shown]
[im 1/30]
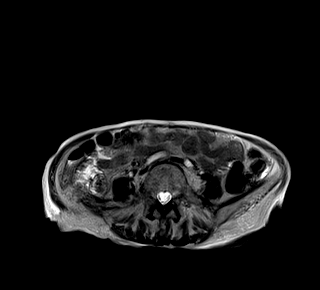

[Series 5: cor haste · coronal · 6.0mm · 1.19mm/px · 1 of 30 slices shown (1 of 2)]
[im 1/30]
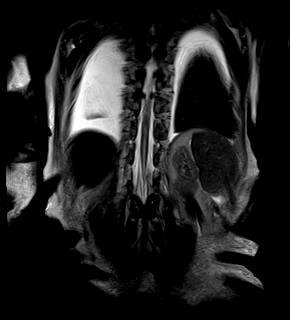

[Series 8: T2 fat-sat · axial · 6.0mm · 1.19mm/px · 1 of 30 slices shown]
[im 1/30]
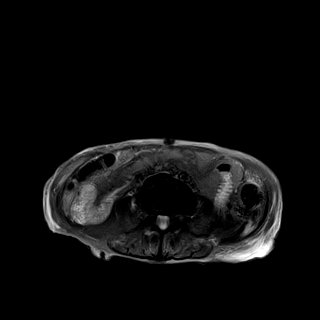

[Series 9: cor haste · coronal · 6.0mm · 1.19mm/px · 1 of 24 slices shown (2 of 2)]
[im 1/24]
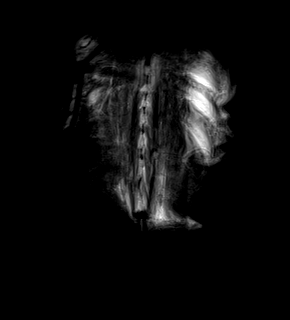

[Series 10: DWI · axial · 6.0mm · 1.42mm/px · z∈[-240,-31]mm · 3 of 90 slices shown (1 of 2)]
[im 1/90]
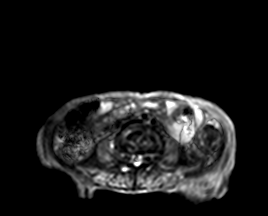
[im 45/90]
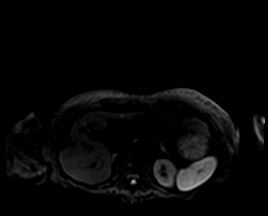
[im 90/90]
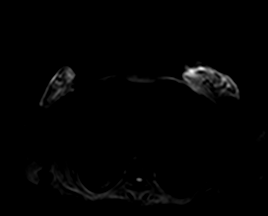

[Series 11: DWI · axial · 6.0mm · 1.42mm/px · 1 of 30 slices shown (2 of 2)]
[im 1/30]
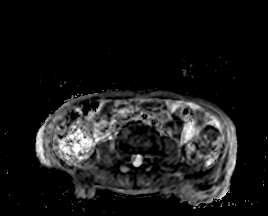

[Series 12: ax in and · axial · 3.0mm · 1.19mm/px · z∈[-242,-29]mm · 2 of 72 slices shown (1 of 2)]
[im 1/72]
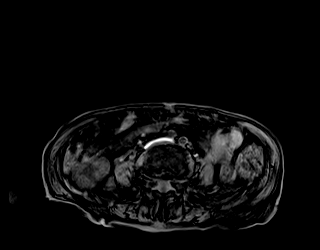
[im 72/72]
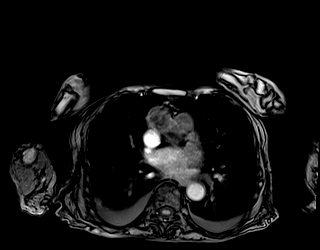

[Series 12: ax in and · axial · 3.0mm · 1.19mm/px · z∈[-242,-29]mm · 3 of 72 slices shown (2 of 2)]
[im 1/72]
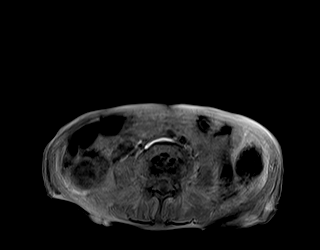
[im 36/72]
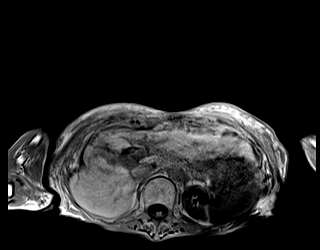
[im 72/72]
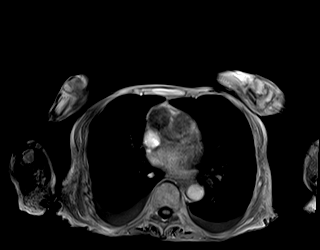

[Series 17: MRCP · coronal · 4.0mm · 1.12mm/px · 1 of 15 slices shown]
[im 1/15]
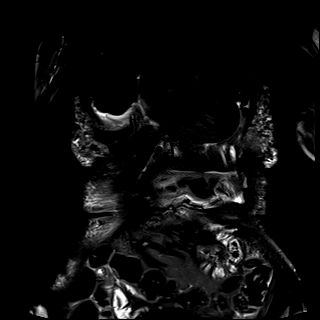

[Series 18: radials · coronal · 50.0mm · 0.78mm/px · 1 of 5 slices shown]
[im 1/5]
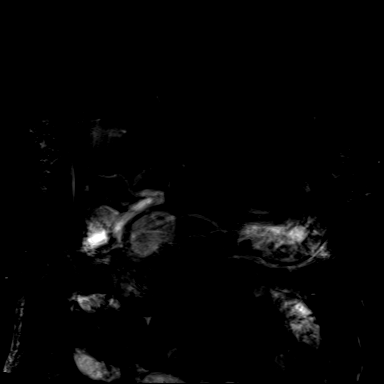

[Series 19: T1 dynamic · axial · non-contrast · 3.0mm · 1.12mm/px · z∈[-237,-48]mm · 3 of 64 slices shown (1 of 5)]
[im 1/64]
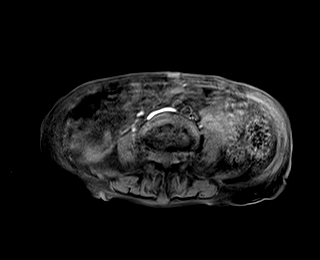
[im 32/64]
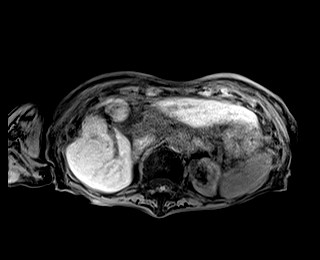
[im 64/64]
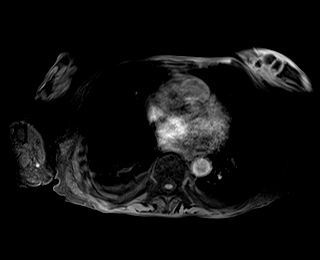

[Series 21: T1 dynamic post-contrast · axial · 3.0mm · 1.12mm/px · z∈[-237,-48]mm · 3 of 64 slices shown (1 of 5)]
[im 1/64]
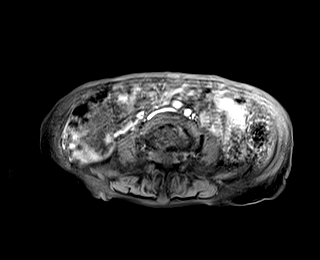
[im 32/64]
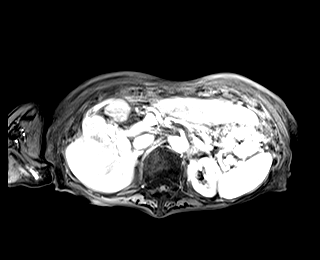
[im 64/64]
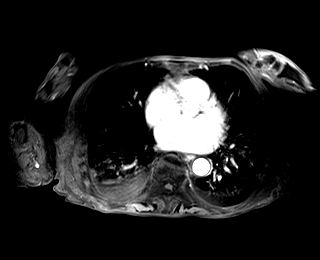

[Series 22: T1 dynamic · axial · 3.0mm · 1.12mm/px · z∈[-237,-48]mm · 3 of 64 slices shown (2 of 5)]
[im 1/64]
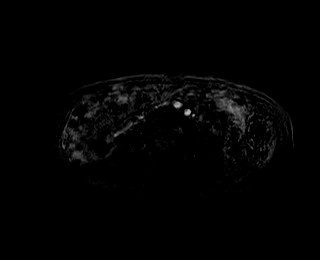
[im 32/64]
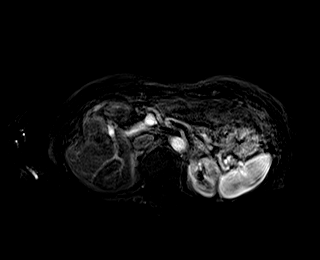
[im 64/64]
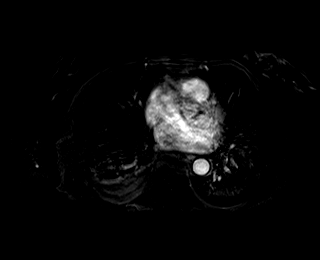

[Series 23: T1 dynamic post-contrast · axial · 3.0mm · 1.12mm/px · z∈[-237,-48]mm · 3 of 64 slices shown (2 of 5)]
[im 1/64]
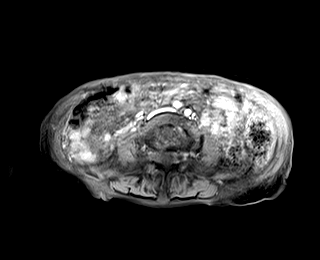
[im 32/64]
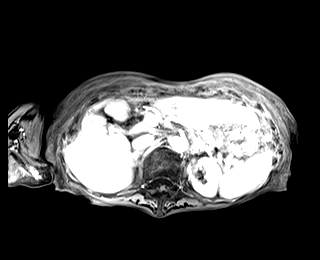
[im 64/64]
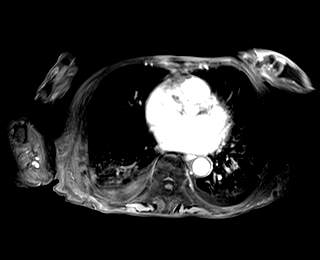

[Series 24: T1 dynamic · axial · 3.0mm · 1.12mm/px · z∈[-237,-48]mm · 3 of 64 slices shown (3 of 5)]
[im 1/64]
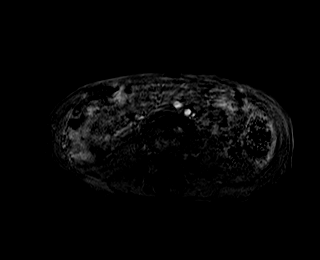
[im 32/64]
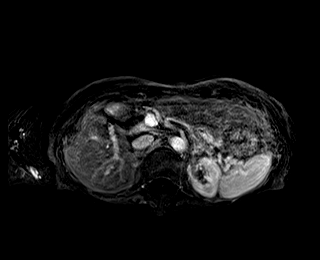
[im 64/64]
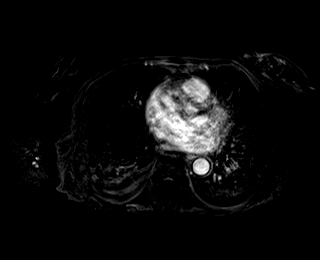

[Series 25: T1 dynamic post-contrast · axial · 3.0mm · 1.12mm/px · z∈[-237,-48]mm · 3 of 64 slices shown (3 of 5)]
[im 1/64]
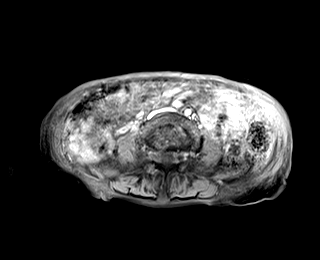
[im 32/64]
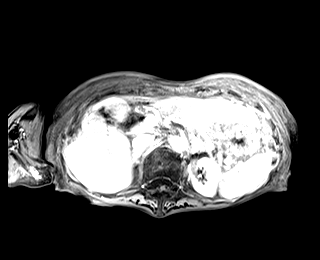
[im 64/64]
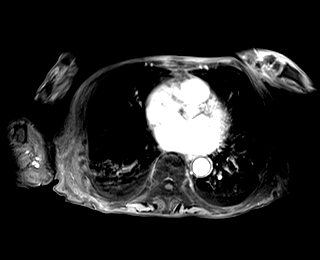

[Series 26: T1 dynamic · axial · 3.0mm · 1.12mm/px · z∈[-237,-48]mm · 3 of 64 slices shown (4 of 5)]
[im 1/64]
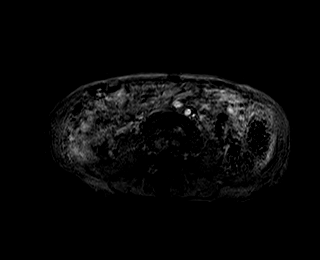
[im 32/64]
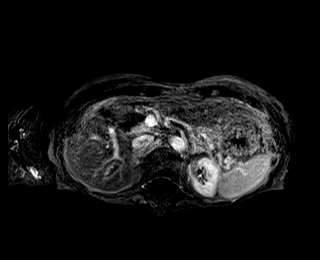
[im 64/64]
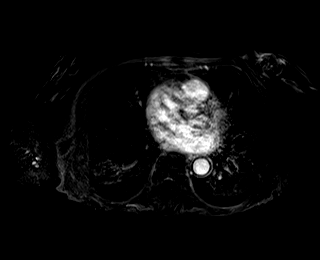

[Series 27: T1 dynamic post-contrast · axial · 3.0mm · 1.12mm/px · z∈[-237,-48]mm · 3 of 64 slices shown (4 of 5)]
[im 1/64]
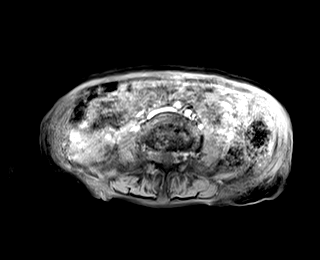
[im 32/64]
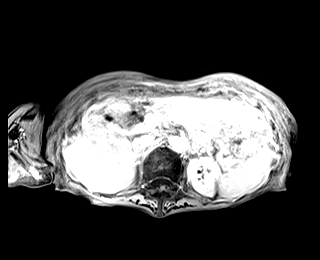
[im 64/64]
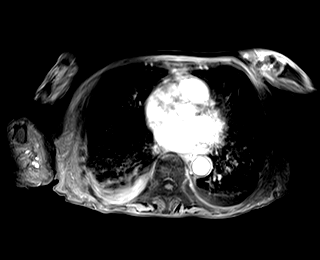

[Series 28: T1 dynamic · axial · 3.0mm · 1.12mm/px · z∈[-237,-48]mm · 3 of 64 slices shown (5 of 5)]
[im 1/64]
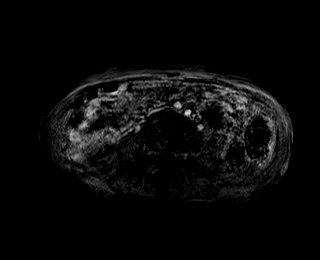
[im 32/64]
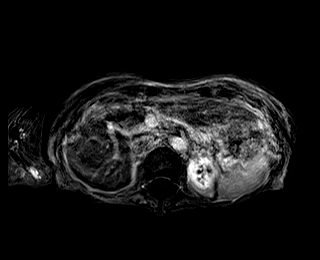
[im 64/64]
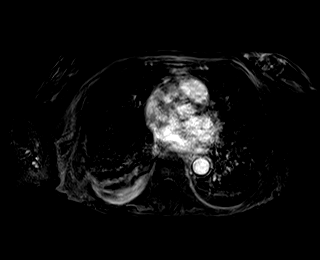

[Series 29: T1 dynamic post-contrast · coronal · 3.0mm · 1.31mm/px · 3 of 64 slices shown (5 of 5)]
[im 1/64]
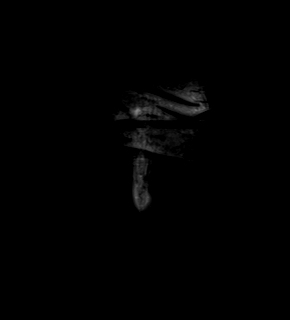
[im 32/64]
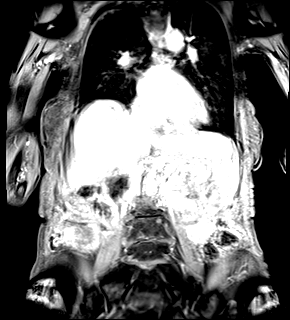
[im 64/64]
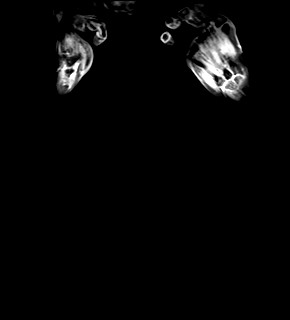

[45 of 48 positions shown; findings below may reference images not displayed]

FINDINGS: Motion degraded images.

Lower chest: Small bilateral pleural effusions, right greater than
left.

Hepatobiliary: Liver is within normal limits. No
suspicious/enhancing hepatic lesions.

Status post cholecystectomy. No intrahepatic ductal dilatation.
Common duct measures 8 mm, within the upper limits of normal. No
choledocholithiasis is seen.

Pancreas:  Motion degraded but grossly unremarkable.

Spleen:  Within normal limits.

Adrenals/Urinary Tract:  Adrenal glands are within normal limits.

Kidneys are within normal limits.  No hydronephrosis.

Stomach/Bowel: Mild wall thickening involving the proximal stomach
(series 4/image 17), without associated mass.

Visualized bowel is grossly unremarkable.

Vascular/Lymphatic:  No evidence of abdominal aortic aneurysm.

No suspicious abdominal lymphadenopathy.

Other:  No abdominal ascites.

Musculoskeletal: Degenerative changes of the lumbar spine.
IMPRESSION: Motion degraded images.

Status post cholecystectomy. No intrahepatic or extrahepatic ductal
dilatation. Common duct measures 8 mm, the upper limits of normal.
No choledocholithiasis is seen.

Small bilateral pleural effusions, right greater than left.

## 2022-09-17 ENCOUNTER — Ambulatory Visit: Payer: Medicare Other | Admitting: Nurse Practitioner

## 2023-04-15 ENCOUNTER — Emergency Department (HOSPITAL_COMMUNITY): Payer: Medicare Other

## 2023-04-15 ENCOUNTER — Encounter (HOSPITAL_COMMUNITY): Payer: Self-pay | Admitting: Emergency Medicine

## 2023-04-15 ENCOUNTER — Emergency Department (HOSPITAL_COMMUNITY)
Admission: EM | Admit: 2023-04-15 | Discharge: 2023-04-15 | Disposition: A | Discharge status: Home / Self Care | Payer: Medicare Other | Attending: Emergency Medicine | Admitting: Emergency Medicine

## 2023-04-15 ENCOUNTER — Other Ambulatory Visit: Payer: Self-pay

## 2023-04-15 DIAGNOSIS — S0181XA Laceration without foreign body of other part of head, initial encounter: Secondary | ICD-10-CM | POA: Diagnosis present

## 2023-04-15 DIAGNOSIS — W19XXXA Unspecified fall, initial encounter: Secondary | ICD-10-CM | POA: Diagnosis not present

## 2023-04-15 MED ORDER — LIDOCAINE-EPINEPHRINE-TETRACAINE (LET) TOPICAL GEL
3.0000 mL | Freq: Once | TOPICAL | Status: AC
  Administered 2023-04-15: 3 mL via TOPICAL
  Filled 2023-04-15: qty 3

## 2023-04-15 NOTE — ED Notes (Signed)
Dermabond at bedside.  

## 2023-04-15 NOTE — ED Notes (Signed)
Fall risk precautions implemented. Yellow grippy socks, fall risk wristband applied, bed alarm activated.  

## 2023-04-15 NOTE — ED Notes (Signed)
Pt transport to CT  

## 2023-04-15 NOTE — ED Provider Notes (Signed)
[place] EMERGENCY DEPARTMENT AT [facility] Provider Note   CSN: [id number] Arrival date & time: 04/15/23  0225     History  Chief Complaint  Patient presents with   Marletta Lor    Shelby Bradley is a 89 y.o. female.  The history is provided by the EMS personnel. The history is limited by the condition of the patient (level 5 caveat dementia).  Fall This is a new problem. The current episode started less than 1 hour ago. The problem occurs constantly. The problem has not changed since onset.Nothing aggravates the symptoms. Nothing relieves the symptoms. She has tried nothing for the symptoms. The treatment provided no relief.       Home Medications Prior to Admission medications   Medication Sig Start Date End Date Taking? Authorizing Provider  acetaminophen (TYLENOL) 325 MG tablet Take 2 tablets (650 mg total) by mouth every 6 (six) hours as needed for mild pain (or Fever >/= 101). 12/23/21   Lynn Ito, MD  amitriptyline (ELAVIL) 25 MG tablet Take  1 tablet  at Bedtime  for Sleep 12/22/21   Lucky Cowboy, MD  bisoprolol (ZEBETA) 5 MG tablet Take 1 tablet (5 mg total) by mouth daily. 12/24/21   Lynn Ito, MD  Cholecalciferol (VITAMIN D-3) 5000 UNITS TABS Take 5,000 Units by mouth 2 (two) times a week.    [provider]  cyanocobalamin (,VITAMIN B-12,) 1000 MCG/ML injection INJECT 1 MILLILITER INTO THE SKIN EVERY 30 DAYS Patient taking differently: Inject 1,000 mcg into the skin every 30 (thirty) days. 02/14/21   Judd Gaudier, NP  feeding supplement (ENSURE ENLIVE / ENSURE PLUS) LIQD Take 237 mLs by mouth 2 (two) times daily between meals. 12/23/21   Lynn Ito, MD  hydrALAZINE (APRESOLINE) 25 MG tablet Take 1 tablet (25 mg total) by mouth every 8 (eight) hours. 12/23/21   Lynn Ito, MD  levothyroxine (SYNTHROID) 112 MCG tablet Take 1 tablet (112 mcg total) by mouth daily before breakfast. 04/24/21   Judd Gaudier, NP  omeprazole (PRILOSEC) 20 MG  capsule Take  1 capsule  Daily to Prevent Heartburn & Indigestion. Patient taking differently: Take 20 mg by mouth daily. 09/17/21   Judd Gaudier, NP  Probiotic Product (RESTORA) CAPS TAKE 1 TABLET DAILY FOR INTESTINAL HEALTHT Patient taking differently: Take 1 capsule by mouth in the morning. 10/10/21   Raynelle Dick, NP      Allergies    Norvasc [amlodipine besylate], Sulfa antibiotics, and Tape    Review of Systems   Review of Systems  Unable to perform ROS: Dementia  Skin:  Positive for wound.    Physical Exam Updated Vital Signs BP (!) 143/66 (BP Location: Left Arm)   Pulse 84   Temp (!) 97.5 F (36.4 C) (Oral)   Resp 16   SpO2 100%  Physical Exam Vitals and nursing note reviewed. Exam conducted with a chaperone present.  Constitutional:      General: She is not in acute distress.    Appearance: Normal appearance. She is well-developed.  HENT:     Head: Normocephalic.      Right Ear: Tympanic membrane normal.     Left Ear: Tympanic membrane normal.     Nose: Nose normal.     Mouth/Throat:     Mouth: Mucous membranes are moist.     Pharynx: Oropharynx is clear.  Eyes:     Conjunctiva/sclera: Conjunctivae normal.     Pupils: Pupils are equal, round, and reactive to light.  Cardiovascular:     Rate and Rhythm: Normal rate and regular rhythm.     Pulses: Normal pulses.     Heart sounds: Normal heart sounds.  Pulmonary:     Effort: Pulmonary effort is normal. No respiratory distress.     Breath sounds: Normal breath sounds.  Abdominal:     General: Bowel sounds are normal. There is no distension.     Palpations: Abdomen is soft.     Tenderness: There is no abdominal tenderness. There is no guarding or rebound.  Genitourinary:    Vagina: No vaginal discharge.  Musculoskeletal:        General: Normal range of motion.     Cervical back: Normal range of motion and neck supple. No rigidity or tenderness.  Skin:    General: Skin is warm and dry.     Capillary  Refill: Capillary refill takes less than 2 seconds.     Findings: No erythema or rash.  Neurological:     Mental Status: She is alert.     Deep Tendon Reflexes: Reflexes normal.     Comments: Awake for me during assessment      ED Results / Procedures / Treatments   Labs (all labs ordered are listed, but only abnormal results are displayed) Labs Reviewed - No data to display  EKG None  Radiology CT Cervical Spine Wo Contrast  Result Date: 04/15/2023 CLINICAL DATA:  Blunt poly trauma, Unwitnessed fall EXAM: CT CERVICAL SPINE WITHOUT CONTRAST TECHNIQUE: Multidetector CT imaging of the cervical spine was performed without intravenous contrast. Multiplanar CT image reconstructions were also generated. RADIATION DOSE REDUCTION: This exam was performed according to the departmental dose-optimization program which includes automated exposure control, adjustment of the mA and/or kV according to patient size and/or use of iterative reconstruction technique. COMPARISON:  None Available. FINDINGS: Alignment: Normal. Skull base and vertebrae: Craniocervical alignment is normal. The atlantodental interval is not widened. No acute fracture of the cervical spine. Vertebral body height is preserved. Soft tissues and spinal canal: No prevertebral fluid or swelling. No visible canal hematoma. Disc levels: Intervertebral disc space narrowing and endplate remodeling of C4-C7 is present in keeping with changes of advanced degenerative disc disease. Prevertebral soft tissues are not thickened on sagittal reformats. Spinal canal is widely patent. No significant neuroforaminal narrowing. Upper chest: Biapical scarring is partially visualized. Other: None IMPRESSION: 1. No acute fracture or listhesis of the cervical spine. Electronically Signed   By: Helyn Numbers M.D.   On: 04/15/2023 03:40   CT Head Wo Contrast  Result Date: 04/15/2023 CLINICAL DATA:  Polytrauma, blunt.  Fall. EXAM: CT HEAD WITHOUT CONTRAST  TECHNIQUE: Contiguous axial images were obtained from the base of the skull through the vertex without intravenous contrast. RADIATION DOSE REDUCTION: This exam was performed according to the departmental dose-optimization program which includes automated exposure control, adjustment of the mA and/or kV according to patient size and/or use of iterative reconstruction technique. COMPARISON:  None Available. FINDINGS: Brain: There is atrophy and chronic small vessel disease changes. No acute intracranial abnormality. Specifically, no hemorrhage, hydrocephalus, mass lesion, acute infarction, or significant intracranial injury. Vascular: No hyperdense vessel or unexpected calcification. Skull: No acute calvarial abnormality. Sinuses/Orbits: No acute findings Other: None IMPRESSION: Atrophy, chronic microvascular disease. No acute intracranial abnormality. Electronically Signed   By: Charlett Nose M.D.   On: 04/15/2023 03:35    Procedures .Marland KitchenLaceration Repair  Date/Time: 04/15/2023 4:00 AM  Performed by: Cy Blamer, MD Authorized by: Cy Blamer, MD  Consent:    Risks discussed:  Infection, need for additional repair and nerve damage   Alternatives discussed:  No treatment Universal protocol:    Patient identity confirmed:  Arm band Anesthesia:    Anesthesia method:  Topical application   Topical anesthetic:  LET Laceration details:    Location: forehead.   Length (cm):  1   Depth (mm):  1 Pre-procedure details:    Preparation:  Patient was prepped and draped in usual sterile fashion Treatment:    Area cleansed with:  Chlorhexidine   Amount of cleaning:  Extensive   Debridement:  None   Undermining:  None Skin repair:    Repair method:  Tissue adhesive Approximation:    Approximation:  Close Repair type:    Repair type:  Simple Post-procedure details:    Dressing:  Open (no dressing)   Procedure completion:  Tolerated well, no immediate complications     Medications Ordered in  ED Medications  lidocaine-EPINEPHrine-tetracaine (LET) topical gel (3 mLs Topical Given 04/15/23 0247)    ED Course/ Medical Decision Making/ A&P                             Medical Decision Making Patient with dementia who had a mechanical fall. Patient was aggressive with staff and EMS and then fell asleep  Amount and/or Complexity of Data Reviewed Independent Historian: EMS    Details: See above  Radiology: ordered and independent interpretation performed.    Details: No acute finding on head CT by me   Risk Risk Details: Patient is asleep but did arouse for me.  She is not combative at this time.      Final Clinical Impression(s) / ED Diagnoses Final diagnoses:  Fall, initial encounter  Return for intractable cough, coughing up blood, fevers > 100.4 unrelieved by medication, shortness of breath, intractable vomiting, chest pain, shortness of breath, weakness, numbness, changes in speech, facial asymmetry, abdominal pain, passing out, Inability to tolerate liquids or food, cough, altered mental status or any concerns. No signs of systemic illness or infection. The patient is nontoxic-appearing on exam and vital signs are within normal limits.  I have reviewed the triage vital signs and the nursing notes. Pertinent labs & imaging results that were available during my care of the patient were reviewed by me and considered in my medical decision making (see chart for details). After history, exam, and medical workup I feel the patient has been appropriately medically screened and is safe for discharge home. Pertinent diagnoses were discussed with the patient. Patient was given return precautions  Rx / DC Orders ED Discharge Orders     None         Jalah Warmuth, MD 04/15/23 1610

## 2023-04-15 NOTE — ED Triage Notes (Signed)
BIBA from [facility] w/ c/o unwitnessed fall. Pt fell and hit her head. Lac to forehead. Pt is aggressive at her baseline. Denies thinners, denies LOC. Pt has catheter in place. VSS Hx dementia

## 2023-05-04 DEATH — deceased
